# Patient Record
Sex: Female | Born: 1935
Health system: Southern US, Community
[De-identification: ages and names within clinical notes are randomized; demographics above are authoritative.]

## PROBLEM LIST (undated history)

## (undated) DIAGNOSIS — I2510 Atherosclerotic heart disease of native coronary artery without angina pectoris: Secondary | ICD-10-CM

## (undated) DIAGNOSIS — Z8719 Personal history of other diseases of the digestive system: Secondary | ICD-10-CM

## (undated) DIAGNOSIS — I441 Atrioventricular block, second degree: Secondary | ICD-10-CM

## (undated) DIAGNOSIS — D649 Anemia, unspecified: Secondary | ICD-10-CM

## (undated) DIAGNOSIS — K635 Polyp of colon: Secondary | ICD-10-CM

## (undated) DIAGNOSIS — Z9981 Dependence on supplemental oxygen: Secondary | ICD-10-CM

## (undated) DIAGNOSIS — M1990 Unspecified osteoarthritis, unspecified site: Secondary | ICD-10-CM

## (undated) DIAGNOSIS — T7840XA Allergy, unspecified, initial encounter: Secondary | ICD-10-CM

## (undated) DIAGNOSIS — I480 Paroxysmal atrial fibrillation: Secondary | ICD-10-CM

## (undated) DIAGNOSIS — Z950 Presence of cardiac pacemaker: Secondary | ICD-10-CM

## (undated) DIAGNOSIS — S301XXA Contusion of abdominal wall, initial encounter: Secondary | ICD-10-CM

## (undated) DIAGNOSIS — I10 Essential (primary) hypertension: Secondary | ICD-10-CM

## (undated) HISTORY — DX: Allergy, unspecified, initial encounter: T78.40XA

## (undated) HISTORY — DX: Atrioventricular block, second degree: I44.1

## (undated) HISTORY — DX: Unspecified osteoarthritis, unspecified site: M19.90

## (undated) HISTORY — PX: CARDIAC CATHETERIZATION: SHX172

## (undated) HISTORY — PX: VARICOSE VEIN SURGERY: SHX832

## (undated) HISTORY — DX: Polyp of colon: K63.5

## (undated) HISTORY — PX: CATARACT EXTRACTION W/ INTRAOCULAR LENS  IMPLANT, BILATERAL: SHX1307

## (undated) HISTORY — DX: Essential (primary) hypertension: I10

## (undated) HISTORY — PX: BREAST CYST EXCISION: SHX579

## (undated) HISTORY — PX: DILATION AND CURETTAGE OF UTERUS: SHX78

---

## 1965-05-08 HISTORY — PX: BUNIONECTOMY: SHX129

## 1997-12-30 ENCOUNTER — Ambulatory Visit (HOSPITAL_COMMUNITY): Admission: RE | Admit: 1997-12-30 | Discharge: 1997-12-30 | Payer: Self-pay | Admitting: Gastroenterology

## 2004-12-30 ENCOUNTER — Ambulatory Visit (HOSPITAL_COMMUNITY): Admission: RE | Admit: 2004-12-30 | Discharge: 2004-12-30 | Payer: Self-pay | Admitting: Gastroenterology

## 2007-04-11 LAB — HM COLONOSCOPY: HM Colonoscopy: 2

## 2007-05-12 LAB — HM MAMMOGRAPHY: HM Mammogram: NEGATIVE

## 2007-05-12 LAB — HM PAP SMEAR: HM Pap smear: NEGATIVE

## 2008-03-25 ENCOUNTER — Encounter: Admission: RE | Admit: 2008-03-25 | Discharge: 2008-03-25 | Payer: Self-pay | Admitting: Obstetrics and Gynecology

## 2010-05-29 ENCOUNTER — Encounter: Payer: Self-pay | Admitting: Family Medicine

## 2010-09-23 NOTE — Op Note (Signed)
[name], Theresa Norton            ACCOUNT NO.:  [id number]   MEDICAL RECORD NO.:  [id number]          PATIENT TYPE:  AMB   LOCATION:  ENDO                         FACILITY:  [facility]   PHYSICIAN:  Anselmo Rod, M.D.  DATE OF BIRTH:  18-Sep-1928   DATE OF PROCEDURE:  12/30/2004  DATE OF DISCHARGE:                                 OPERATIVE REPORT   PROCEDURE:  Screening colonoscopy.   ENDOSCOPIST:  Anselmo Rod, M.D.   INSTRUMENT USED:  Olympus video colonoscope.   INDICATIONS FOR PROCEDURE:  A 77 year old white female underwent a screening  colonoscopy to rule out colonic polyps, masses, etc.   PREPROCEDURE PREPARATION:  Informed consent was procured from the patient.  The patient was fasted for 8 hours prior to the procedure and prepped with  Osmoprep pills prior to the procedure.   PREPROCEDURE PHYSICAL:  The patient had stable vital signs. Neck supple.  Chest clear to auscultation. S1, S2 regular. Abdomen soft with normal bowel  sounds.   DESCRIPTION OF PROCEDURE:  The patient was placed in the left lateral  decubitus position and sedated with 45 mg of Demerol and 5 mg of Versed in  slow incremental doses. Once the patient was adequately sedated and  maintained on low flow oxygen and continuous cardiac monitoring, the Olympus  video colonoscope was advanced from the rectum to the cecum. The appendiceal  orifice and ileocecal valve were visualized, multiple washings were done. No  masses, polyps, erosions, ulcerations or diverticula were seen. Retroflexion  in the rectum revealed small internal hemorrhoids. The patient tolerated the  procedure well without complications.   IMPRESSION:  Normal colonoscopy to the cecum except for small nonbleeding  internal hemorrhoids. No masses, polyps, erosions, ulcerations or  diverticula seen.   RECOMMENDATIONS:  1.  Continue on high fiber diet with liberal fluid intake.  2.  Repeat colonoscopy in the next 10 years unless the patient  develops any      abnormal symptoms in the interim.  3.  Outpatient followup as needed arises in the future.      Anselmo Rod, M.D.  Electronically Signed     JNM/MEDQ  D:  12/30/2004  T:  12/31/2004  Job:  [id number]   cc:   Alexia Freestone, M.D.  [address]  [place]  [place] [address]  Fax: [phone]

## 2011-05-10 DIAGNOSIS — I10 Essential (primary) hypertension: Secondary | ICD-10-CM | POA: Diagnosis not present

## 2011-06-29 DIAGNOSIS — H2510 Age-related nuclear cataract, unspecified eye: Secondary | ICD-10-CM | POA: Diagnosis not present

## 2011-06-29 DIAGNOSIS — H353290 Exudative age-related macular degeneration, unspecified eye, stage unspecified: Secondary | ICD-10-CM | POA: Diagnosis not present

## 2011-06-29 DIAGNOSIS — H353190 Nonexudative age-related macular degeneration, unspecified eye, stage unspecified: Secondary | ICD-10-CM | POA: Diagnosis not present

## 2011-07-18 DIAGNOSIS — R7989 Other specified abnormal findings of blood chemistry: Secondary | ICD-10-CM | POA: Diagnosis not present

## 2011-07-18 DIAGNOSIS — I10 Essential (primary) hypertension: Secondary | ICD-10-CM | POA: Diagnosis not present

## 2011-07-18 DIAGNOSIS — E559 Vitamin D deficiency, unspecified: Secondary | ICD-10-CM | POA: Diagnosis not present

## 2011-08-02 DIAGNOSIS — I10 Essential (primary) hypertension: Secondary | ICD-10-CM | POA: Diagnosis not present

## 2011-08-16 DIAGNOSIS — H2510 Age-related nuclear cataract, unspecified eye: Secondary | ICD-10-CM | POA: Diagnosis not present

## 2011-08-16 DIAGNOSIS — H3530 Unspecified macular degeneration: Secondary | ICD-10-CM | POA: Diagnosis not present

## 2011-09-07 DIAGNOSIS — H353290 Exudative age-related macular degeneration, unspecified eye, stage unspecified: Secondary | ICD-10-CM | POA: Diagnosis not present

## 2011-09-07 DIAGNOSIS — H353190 Nonexudative age-related macular degeneration, unspecified eye, stage unspecified: Secondary | ICD-10-CM | POA: Diagnosis not present

## 2011-09-07 DIAGNOSIS — H2510 Age-related nuclear cataract, unspecified eye: Secondary | ICD-10-CM | POA: Diagnosis not present

## 2011-10-30 DIAGNOSIS — I10 Essential (primary) hypertension: Secondary | ICD-10-CM | POA: Diagnosis not present

## 2011-11-16 DIAGNOSIS — H353290 Exudative age-related macular degeneration, unspecified eye, stage unspecified: Secondary | ICD-10-CM | POA: Diagnosis not present

## 2011-11-16 DIAGNOSIS — H2510 Age-related nuclear cataract, unspecified eye: Secondary | ICD-10-CM | POA: Diagnosis not present

## 2011-11-16 DIAGNOSIS — H353190 Nonexudative age-related macular degeneration, unspecified eye, stage unspecified: Secondary | ICD-10-CM | POA: Diagnosis not present

## 2012-01-03 DIAGNOSIS — E559 Vitamin D deficiency, unspecified: Secondary | ICD-10-CM | POA: Diagnosis not present

## 2012-01-03 DIAGNOSIS — I10 Essential (primary) hypertension: Secondary | ICD-10-CM | POA: Diagnosis not present

## 2012-01-03 DIAGNOSIS — R7989 Other specified abnormal findings of blood chemistry: Secondary | ICD-10-CM | POA: Diagnosis not present

## 2012-01-03 DIAGNOSIS — E039 Hypothyroidism, unspecified: Secondary | ICD-10-CM | POA: Diagnosis not present

## 2012-01-15 DIAGNOSIS — I10 Essential (primary) hypertension: Secondary | ICD-10-CM | POA: Diagnosis not present

## 2012-01-18 DIAGNOSIS — H2510 Age-related nuclear cataract, unspecified eye: Secondary | ICD-10-CM | POA: Diagnosis not present

## 2012-01-18 DIAGNOSIS — H353290 Exudative age-related macular degeneration, unspecified eye, stage unspecified: Secondary | ICD-10-CM | POA: Diagnosis not present

## 2012-01-18 DIAGNOSIS — H353190 Nonexudative age-related macular degeneration, unspecified eye, stage unspecified: Secondary | ICD-10-CM | POA: Diagnosis not present

## 2012-02-16 DIAGNOSIS — H2510 Age-related nuclear cataract, unspecified eye: Secondary | ICD-10-CM | POA: Diagnosis not present

## 2012-02-16 DIAGNOSIS — H353190 Nonexudative age-related macular degeneration, unspecified eye, stage unspecified: Secondary | ICD-10-CM | POA: Diagnosis not present

## 2012-03-28 DIAGNOSIS — H353190 Nonexudative age-related macular degeneration, unspecified eye, stage unspecified: Secondary | ICD-10-CM | POA: Diagnosis not present

## 2012-03-28 DIAGNOSIS — H353290 Exudative age-related macular degeneration, unspecified eye, stage unspecified: Secondary | ICD-10-CM | POA: Diagnosis not present

## 2012-04-10 ENCOUNTER — Ambulatory Visit (INDEPENDENT_AMBULATORY_CARE_PROVIDER_SITE_OTHER): Payer: Medicare Other | Admitting: Family Medicine

## 2012-04-10 ENCOUNTER — Encounter: Payer: Self-pay | Admitting: Family Medicine

## 2012-04-10 VITALS — BP 205/88 | HR 72 | Temp 97.7°F | Resp 12 | Ht 61.5 in | Wt 167.0 lb

## 2012-04-10 DIAGNOSIS — R030 Elevated blood-pressure reading, without diagnosis of hypertension: Secondary | ICD-10-CM | POA: Diagnosis not present

## 2012-04-10 DIAGNOSIS — K5900 Constipation, unspecified: Secondary | ICD-10-CM | POA: Diagnosis not present

## 2012-04-10 DIAGNOSIS — Z86010 Personal history of colonic polyps: Secondary | ICD-10-CM | POA: Diagnosis not present

## 2012-04-10 DIAGNOSIS — IMO0001 Reserved for inherently not codable concepts without codable children: Secondary | ICD-10-CM

## 2012-04-10 MED ORDER — AMLODIPINE BESYLATE 5 MG PO TABS: 5.0000 mg | ORAL_TABLET | Freq: Every day | ORAL | Status: DC

## 2012-04-10 NOTE — Patient Instructions (Signed)
Hypertension  As your heart beats, it forces blood through your arteries. This force is your blood pressure. If the pressure is too high, it is called hypertension (HTN) or high blood pressure. HTN is dangerous because you may have it and not know it. High blood pressure may mean that your heart has to work harder to pump blood. Your arteries may be narrow or stiff. The extra work puts you at risk for heart disease, stroke, and other problems.   Blood pressure consists of two numbers, a higher number over a lower, 110/72, for example. It is stated as "110 over 72." The ideal is below 120 for the top number (systolic) and under 80 for the bottom (diastolic). Write down your blood pressure today.  You should pay close attention to your blood pressure if you have certain conditions such as:   Heart failure.   Prior heart attack.   Diabetes   Chronic kidney disease.   Prior stroke.   Multiple risk factors for heart disease.  To see if you have HTN, your blood pressure should be measured while you are seated with your arm held at the level of the heart. It should be measured at least twice. A one-time elevated blood pressure reading (especially in the Emergency Department) does not mean that you need treatment. There may be conditions in which the blood pressure is different between your right and left arms. It is important to see your caregiver soon for a recheck.  Most people have essential hypertension which means that there is not a specific cause. This type of high blood pressure may be lowered by changing lifestyle factors such as:   Stress.   Smoking.   Lack of exercise.   Excessive weight.   Drug/tobacco/alcohol use.   Eating less salt.  Most people do not have symptoms from high blood pressure until it has caused damage to the body. Effective treatment can often prevent, delay or reduce that damage.  TREATMENT   When a cause has been identified, treatment for high blood pressure is directed at the  cause. There are a large number of medications to treat HTN. These fall into several categories, and your caregiver will help you select the medicines that are best for you. Medications may have side effects. You should review side effects with your caregiver.  If your blood pressure stays high after you have made lifestyle changes or started on medicines,    Your medication(s) may need to be changed.   Other problems may need to be addressed.   Be certain you understand your prescriptions, and know how and when to take your medicine.   Be sure to follow up with your caregiver within the time frame advised (usually within two weeks) to have your blood pressure rechecked and to review your medications.   If you are taking more than one medicine to lower your blood pressure, make sure you know how and at what times they should be taken. Taking two medicines at the same time can result in blood pressure that is too low.  SEEK IMMEDIATE MEDICAL CARE IF:   You develop a severe headache, blurred or changing vision, or confusion.   You have unusual weakness or numbness, or a faint feeling.   You have severe chest or abdominal pain, vomiting, or breathing problems.  MAKE SURE YOU:    Understand these instructions.   Will watch your condition.   Will get help right away if you are not doing well   or get worse.  Document Released: 04/24/2005 Document Revised: 07/17/2011 Document Reviewed: 12/13/2007  [employer] Patient Information 2013 [employer].    Constipation, Adult  Constipation is when a person has fewer than 3 bowel movements a week; has difficulty having a bowel movement; or has stools that are dry, hard, or larger than normal. As people grow older, constipation is more common. If you try to fix constipation with medicines that make you have a bowel movement (laxatives), the problem may get worse. Long-term laxative use may cause the muscles of the colon to become weak. A low-fiber diet, not taking in  enough fluids, and taking certain medicines may make constipation worse.  CAUSES    Certain medicines, such as antidepressants, pain medicine, iron supplements, antacids, and water pills.    Certain diseases, such as diabetes, irritable bowel syndrome (IBS), thyroid disease, or depression.    Not drinking enough water.    Not eating enough fiber-rich foods.    Stress or travel.   Lack of physical activity or exercise.   Not going to the restroom when there is the urge to have a bowel movement.   Ignoring the urge to have a bowel movement.   Using laxatives too much.  SYMPTOMS    Having fewer than 3 bowel movements a week.    Straining to have a bowel movement.    Having hard, dry, or larger than normal stools.    Feeling full or bloated.    Pain in the lower abdomen.   Not feeling relief after having a bowel movement.  DIAGNOSIS   Your caregiver will take a medical history and perform a physical exam. Further testing may be done for severe constipation. Some tests may include:    A barium enema X-ray to examine your rectum, colon, and sometimes, your small intestine.   A sigmoidoscopy to examine your lower colon.   A colonoscopy to examine your entire colon.  TREATMENT   Treatment will depend on the severity of your constipation and what is causing it. Some dietary treatments include drinking more fluids and eating more fiber-rich foods. Lifestyle treatments may include regular exercise. If these diet and lifestyle recommendations do not help, your caregiver may recommend taking over-the-counter laxative medicines to help you have bowel movements. Prescription medicines may be prescribed if over-the-counter medicines do not work.   HOME CARE INSTRUCTIONS    Increase dietary fiber in your diet, such as fruits, vegetables, whole grains, and beans. Limit high-fat and processed sugars in your diet, such as French fries, hamburgers, cookies, candies, and soda.    A fiber supplement may be  added to your diet if you cannot get enough fiber from foods.    Drink enough fluids to keep your urine clear or pale yellow.    Exercise regularly or as directed by your caregiver.    Go to the restroom when you have the urge to go. Do not hold it.   Only take medicines as directed by your caregiver. Do not take other medicines for constipation without talking to your caregiver first.  SEEK IMMEDIATE MEDICAL CARE IF:    You have bright red blood in your stool.    Your constipation lasts for more than 4 days or gets worse.    You have abdominal or rectal pain.    You have thin, pencil-like stools.   You have unexplained weight loss.  MAKE SURE YOU:    Understand these instructions.   Will watch your condition.  

## 2012-04-10 NOTE — Progress Notes (Signed)
  Subjective:    Patient ID: Theresa Norton, female    DOB: 08-18-1928, 78 y.o.   MRN: [id number]  HPI  Patient new to establish care. She's previously been seen in an alternative medicine type clinic. Not clear she's had any recent lab work. Past medical history reviewed. Takes no medications. She's had some generalized osteoarthritis but coping fairly well. She relates several months and possibly several year history of elevated blood pressure but never treated with medication. She states her systolic blood pressures have been as high as 200 in recent months but she's never placed on medication. Denies headache. No chest pains. No dizziness. No peripheral edema.  Patient reports past history of benign colon polyps. She thinks she had colonoscopy in 2008 but not sure. She has frequent problems with constipation. No anti-cholinergic medicine use. Otherwise no change in stools.  Patient is widowed since May of this year. She is coping fairly well. Has good support systems. No history of smoking. Rare alcohol use.   Review of Systems  Constitutional: Positive for fatigue (occasional mild fatigue issues).  Eyes: Negative for visual disturbance.  Respiratory: Negative for cough, chest tightness, shortness of breath and wheezing.   Cardiovascular: Negative for chest pain, palpitations and leg swelling.  Neurological: Negative for dizziness, seizures, syncope, weakness, light-headedness and headaches.       Objective:   Physical Exam  Constitutional: She appears well-developed and well-nourished.  Neck: Neck supple. No thyromegaly present.  Cardiovascular: Normal rate and regular rhythm.   Pulmonary/Chest: Effort normal and breath sounds normal. No respiratory distress. She has no wheezes. She has no rales.  Abdominal: Soft. Bowel sounds are normal. She exhibits no distension and no mass. There is no tenderness. There is no rebound and no guarding.       No renal bruits  Musculoskeletal: She  exhibits no edema.          Assessment & Plan:  #1 hypertension, severe. She gives history of several previous elevation but never treated. Given the severity of elevation, start amlodipine 5 mg daily. Educational handout given. Sodium reduction diet. Reassess one week. Consider addition of HCTZ if still elevated at that time.  We'll plan labs at followup including lipid panel, basic metabolic panel, and TSH #2 history of constipation. Discussed conservative measures to reduce. Confirm date of last colonoscopy #3 history of reported benign colon polyps

## 2012-04-16 DIAGNOSIS — I10 Essential (primary) hypertension: Secondary | ICD-10-CM | POA: Diagnosis not present

## 2012-04-17 ENCOUNTER — Ambulatory Visit (INDEPENDENT_AMBULATORY_CARE_PROVIDER_SITE_OTHER): Payer: Medicare Other | Admitting: Family Medicine

## 2012-04-17 ENCOUNTER — Encounter: Payer: Self-pay | Admitting: Family Medicine

## 2012-04-17 ENCOUNTER — Encounter: Payer: Self-pay | Admitting: *Deleted

## 2012-04-17 VITALS — BP 205/88 | HR 45 | Temp 97.8°F | Resp 12 | Wt 166.0 lb

## 2012-04-17 DIAGNOSIS — R001 Bradycardia, unspecified: Secondary | ICD-10-CM

## 2012-04-17 DIAGNOSIS — R42 Dizziness and giddiness: Secondary | ICD-10-CM

## 2012-04-17 DIAGNOSIS — I498 Other specified cardiac arrhythmias: Secondary | ICD-10-CM

## 2012-04-17 DIAGNOSIS — L259 Unspecified contact dermatitis, unspecified cause: Secondary | ICD-10-CM | POA: Diagnosis not present

## 2012-04-17 DIAGNOSIS — I10 Essential (primary) hypertension: Secondary | ICD-10-CM

## 2012-04-17 LAB — LIPID PANEL
Cholesterol: 232 mg/dL — ABNORMAL HIGH (ref 0–200)
HDL: 96.8 mg/dL (ref >39.00)
Total CHOL/HDL Ratio: 2
Triglycerides: 136 mg/dL (ref 0.0–149.0)
VLDL: 27.2 mg/dL (ref 0.0–40.0)

## 2012-04-17 LAB — BASIC METABOLIC PANEL
BUN: 15 mg/dL (ref 6–23)
CO2: 28 mEq/L (ref 19–32)
Calcium: 8.6 mg/dL (ref 8.4–10.5)
Chloride: 100 mEq/L (ref 96–112)
Creatinine, Ser: 0.7 mg/dL (ref 0.4–1.2)
GFR: 79.93 mL/min (ref >60.00)
Glucose, Bld: 95 mg/dL (ref 70–99)
Potassium: 4.3 mEq/L (ref 3.5–5.1)
Sodium: 136 mEq/L (ref 135–145)

## 2012-04-17 LAB — LDL CHOLESTEROL, DIRECT: Direct LDL: 121.9 mg/dL

## 2012-04-17 LAB — TSH: TSH: 0.49 u[IU]/mL (ref 0.35–5.50)

## 2012-04-17 MED ORDER — TRIAMCINOLONE ACETONIDE 0.1 % EX CREA: TOPICAL_CREAM | Freq: Two times a day (BID) | CUTANEOUS | Status: DC

## 2012-04-17 MED ORDER — LISINOPRIL-HYDROCHLOROTHIAZIDE 10-12.5 MG PO TABS: 1.0000 | ORAL_TABLET | Freq: Every day | ORAL | Status: DC

## 2012-04-17 NOTE — Progress Notes (Signed)
  Subjective:    Patient ID: Theresa Norton, female    DOB: 09/02/1928, 78 y.o.   MRN: [id number]  HPI  Patient seen in followup severe hypertension. Patient had been going to alternative medicine clinic. Apparently has had elevated blood pressure for quite some time. We started amlodipine 5 mg daily. She did not take dosage this morning. Blood pressure by home machine 170/80. No headaches. She describes some mild dizziness couple times earlier this week. No chest pain. No orthostasis. During episodes patient states her heart rate was down around 35 or 40. Only takes amlodipine 5 mg daily. No beta blocker use. No history of heart difficulties. Denies current dizziness at rest.  Patient complains of some itching and irritation right antecubital fossa. She apparently has some type of IV injection of B. and C. vitamins recently at alternative health clinic above. Denies any fever.  Past Medical History  Diagnosis Date  . Arthritis   . Allergy   . Hypertension   . Colon polyps    Past Surgical History  Procedure Date  . Breast surgery 1959    cyst removal  . Bunionectomy 1967    both feet    reports that she has never smoked. She does not have any smokeless tobacco history on file. Her alcohol and drug histories not on file. family history includes Cancer in her father. Allergies  Allergen Reactions  . Codeine     GI upset  . Morphine And Related     GI upset      Review of Systems  Constitutional: Negative for fever and chills.  Respiratory: Negative for cough, shortness of breath and wheezing.   Cardiovascular: Negative for chest pain, palpitations and leg swelling.  Gastrointestinal: Negative for abdominal pain.  Skin: Positive for rash.  Neurological: Positive for dizziness. Negative for syncope, weakness and headaches.  Hematological: Negative for adenopathy. Does not bruise/bleed easily.       Objective:   Physical Exam  Constitutional: She is oriented to person,  place, and time. She appears well-developed and well-nourished. No distress.  Neck: Neck supple. No thyromegaly present.  Cardiovascular:       Regular rhythm bradycardic with rate around 45-48 by my auscultation  Pulmonary/Chest: Effort normal and breath sounds normal. No respiratory distress. She has no wheezes. She has no rales.  Musculoskeletal: She exhibits no edema.  Lymphadenopathy:    She has no cervical adenopathy.  Neurological: She is alert and oriented to person, place, and time.          Assessment & Plan:  #1 severe hypertension. Even by home readings poorly controlled. Add HCTZ 12.5 mg once daily. Avoid beta blocker use with bradycardia #2 bradycardia by exam today. Check EKG. Rule out heart block. She does not take any beta blockers. Not symptomatic currently. Consider Holter monitor #3 skin rash right elbow. This appears compatible with contact type dermatitis. No evidence for cellulitis. Triamcinolone 0.1% cream  EKG junctional bradycardia with high grade AV block.  HR 44.  Cardiology consult.  Spoke with cardiology-Dr Ladona Ridgel. Recommendation to discontinue amlodipine although low risk associated heart block-. Start lisinopril HCTZ 10/12.5 one daily. Patient has no symptoms at this time. She knows to present immediately to emergency department if she developed any dizziness or other symptoms. We'll reassess her Monday. If still bradycardic at that point we'll push to get her in to cardiology sooner. They have tentatively scheduled to see her a week from this Friday.

## 2012-04-17 NOTE — Patient Instructions (Addendum)
Stop amlodipine Start lisinopril HCTZ one daily starting tomorrow Schedule followup here in our office next Monday Followup sooner or go to emergency room if you develop any increased dizziness, shortness of breath, or any other concerning symptoms

## 2012-04-19 NOTE — Progress Notes (Signed)
Quick Note:  Pt informed on personally identified VM ______ 

## 2012-04-22 ENCOUNTER — Ambulatory Visit: Payer: Medicare Other | Admitting: Family Medicine

## 2012-04-23 ENCOUNTER — Encounter: Payer: Self-pay | Admitting: Family Medicine

## 2012-04-23 ENCOUNTER — Ambulatory Visit (INDEPENDENT_AMBULATORY_CARE_PROVIDER_SITE_OTHER): Payer: Medicare Other | Admitting: Family Medicine

## 2012-04-23 VITALS — BP 160/84 | HR 80 | Temp 98.5°F | Resp 12 | Wt 168.0 lb

## 2012-04-23 DIAGNOSIS — I441 Atrioventricular block, second degree: Secondary | ICD-10-CM

## 2012-04-23 DIAGNOSIS — I10 Essential (primary) hypertension: Secondary | ICD-10-CM

## 2012-04-23 NOTE — Patient Instructions (Addendum)
Continue daily use of blood pressure medication. Followup immediately for any increased dizziness or if your heart rate drops below 50 again

## 2012-04-23 NOTE — Progress Notes (Signed)
  Subjective:    Patient ID: Theresa Norton, female    DOB: 1928/07/23, 78 y.o.   MRN: [id number]  HPI  Patient seen for followup regarding severe hypertension and bradycardia with high-grade A-V block. Patient was not symptomatic at presentation last week. She had recently been started on amlodipine. We discontinued amlodipine and started lisinopril HCTZ. She has no dizziness at this time. She has noted that her pulse has increased back to normal and her blood pressure has also improved. She was over 200 systolic previously now 150-160 systolic. No headaches. No chest pains. No dyspnea. No history of beta blocker use  Past Medical History  Diagnosis Date  . Arthritis   . Allergy   . Hypertension   . Colon polyps    Past Surgical History  Procedure Date  . Breast surgery 1959    cyst removal  . Bunionectomy 1967    both feet    reports that she has never smoked. She does not have any smokeless tobacco history on file. Her alcohol and drug histories not on file. family history includes Cancer in her father. Allergies  Allergen Reactions  . Codeine     GI upset  . Morphine And Related     GI upset      Review of Systems  Constitutional: Negative for fatigue.  Eyes: Negative for visual disturbance.  Respiratory: Negative for cough, chest tightness, shortness of breath and wheezing.   Cardiovascular: Negative for chest pain, palpitations and leg swelling.  Neurological: Negative for dizziness, seizures, syncope, weakness, light-headedness and headaches.       Objective:   Physical Exam  Constitutional: She appears well-developed and well-nourished.  Cardiovascular: Normal rate and regular rhythm.  Exam reveals no gallop.   Pulmonary/Chest: Effort normal and breath sounds normal. No respiratory distress. She has no wheezes. She has no rales.  Musculoskeletal: She exhibits no edema.          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 Clinically, patient appears to have resolved and has normal pulse with rate around 76 today. Repeat EKG and she appears to be back in sinus rhythm. No further calcium channel blocker or beta blocker. #2 hypertension. Improving control with lisinopril HCTZ. Reassess in one month.  Titrate then if indicated.  EKG shows sinus rhythm with right bundle branch block type pattern. Her heart rate is back up in the normal range at 75. Will avoid calcium channel blockers.

## 2012-05-20 ENCOUNTER — Ambulatory Visit (INDEPENDENT_AMBULATORY_CARE_PROVIDER_SITE_OTHER): Payer: Medicare Other | Admitting: Family Medicine

## 2012-05-20 ENCOUNTER — Encounter: Payer: Self-pay | Admitting: Family Medicine

## 2012-05-20 VITALS — BP 132/80 | Temp 98.4°F | Wt 166.0 lb

## 2012-05-20 DIAGNOSIS — I10 Essential (primary) hypertension: Secondary | ICD-10-CM | POA: Diagnosis not present

## 2012-05-20 LAB — BASIC METABOLIC PANEL
BUN: 12 mg/dL (ref 6–23)
CO2: 24 mEq/L (ref 19–32)
Calcium: 8.8 mg/dL (ref 8.4–10.5)
Chloride: 93 mEq/L — ABNORMAL LOW (ref 96–112)
Creatinine, Ser: 0.8 mg/dL (ref 0.4–1.2)
GFR: 78.69 mL/min (ref >60.00)
Glucose, Bld: 86 mg/dL (ref 70–99)
Potassium: 3.9 mEq/L (ref 3.5–5.1)
Sodium: 128 mEq/L — ABNORMAL LOW (ref 135–145)

## 2012-05-20 NOTE — Progress Notes (Signed)
  Subjective:    Patient ID: Theresa Norton, female    DOB: 1928/09/16, 79 y.o.   MRN: [id number]  HPI Patient is a for followup hypertension. Refer to prior note. We initially placed her on amlodipine for severely elevated blood pressure. She returned with bradycardia and Mobitz type II block. I consulted with cardiology and they suggested stopping any calcium channel blocker and we started lisinopril HCTZ. Off followup visit next week she was back to normal rhythm and normal heart rate of 75. She's done extremely well lisinopril HCTZ. Systolic blood pressures been run 130. No dizziness. No cough. Overall feels improved   Review of Systems  Constitutional: Negative for fatigue.  Eyes: Negative for visual disturbance.  Respiratory: Negative for cough, chest tightness, shortness of breath and wheezing.   Cardiovascular: Negative for chest pain, palpitations and leg swelling.  Neurological: Negative for dizziness, seizures, syncope, weakness, light-headedness and headaches.       Objective:   Physical Exam  Constitutional: She appears well-developed and well-nourished.  Neck: Neck supple. No thyromegaly present.  Cardiovascular: Normal rate and regular rhythm.   Pulmonary/Chest: Effort normal and breath sounds normal. No respiratory distress. She has no wheezes. She has no rales.  Musculoskeletal: She exhibits no edema.          Assessment & Plan:  Hypertension. Improved and at goal. Check basic metabolic panel with recent initiation of ACE inhibitor. Recent high-grade A-V block with bradycardia resolved off calcium channel blocker. Avoid future use of beta blockers and calcium channel blockers if possible

## 2012-05-21 ENCOUNTER — Other Ambulatory Visit: Payer: Self-pay | Admitting: *Deleted

## 2012-05-21 DIAGNOSIS — I10 Essential (primary) hypertension: Secondary | ICD-10-CM

## 2012-05-21 MED ORDER — LISINOPRIL 20 MG PO TABS: 20.0000 mg | ORAL_TABLET | Freq: Every day | ORAL | Status: DC

## 2012-05-21 NOTE — Progress Notes (Signed)
Quick Note:  Pt informed on home VM ______ 

## 2012-05-21 NOTE — Progress Notes (Signed)
Quick Note:  Lisinopril 20 mg sent ______

## 2012-06-03 DIAGNOSIS — R300 Dysuria: Secondary | ICD-10-CM | POA: Diagnosis not present

## 2012-06-03 DIAGNOSIS — D509 Iron deficiency anemia, unspecified: Secondary | ICD-10-CM | POA: Diagnosis not present

## 2012-06-03 DIAGNOSIS — I10 Essential (primary) hypertension: Secondary | ICD-10-CM | POA: Diagnosis not present

## 2012-06-03 DIAGNOSIS — R7989 Other specified abnormal findings of blood chemistry: Secondary | ICD-10-CM | POA: Diagnosis not present

## 2012-06-03 DIAGNOSIS — E559 Vitamin D deficiency, unspecified: Secondary | ICD-10-CM | POA: Diagnosis not present

## 2012-06-18 DIAGNOSIS — I10 Essential (primary) hypertension: Secondary | ICD-10-CM | POA: Diagnosis not present

## 2012-07-04 DIAGNOSIS — H353190 Nonexudative age-related macular degeneration, unspecified eye, stage unspecified: Secondary | ICD-10-CM | POA: Diagnosis not present

## 2012-07-04 DIAGNOSIS — H2510 Age-related nuclear cataract, unspecified eye: Secondary | ICD-10-CM | POA: Diagnosis not present

## 2012-07-04 DIAGNOSIS — H353290 Exudative age-related macular degeneration, unspecified eye, stage unspecified: Secondary | ICD-10-CM | POA: Diagnosis not present

## 2012-08-06 DIAGNOSIS — I10 Essential (primary) hypertension: Secondary | ICD-10-CM | POA: Diagnosis not present

## 2012-09-05 DIAGNOSIS — H2510 Age-related nuclear cataract, unspecified eye: Secondary | ICD-10-CM | POA: Diagnosis not present

## 2012-09-05 DIAGNOSIS — H353290 Exudative age-related macular degeneration, unspecified eye, stage unspecified: Secondary | ICD-10-CM | POA: Diagnosis not present

## 2012-09-05 DIAGNOSIS — H353190 Nonexudative age-related macular degeneration, unspecified eye, stage unspecified: Secondary | ICD-10-CM | POA: Diagnosis not present

## 2012-09-18 DIAGNOSIS — I10 Essential (primary) hypertension: Secondary | ICD-10-CM | POA: Diagnosis not present

## 2012-09-25 DIAGNOSIS — H2510 Age-related nuclear cataract, unspecified eye: Secondary | ICD-10-CM | POA: Diagnosis not present

## 2012-09-30 DIAGNOSIS — J019 Acute sinusitis, unspecified: Secondary | ICD-10-CM | POA: Diagnosis not present

## 2012-09-30 DIAGNOSIS — R059 Cough, unspecified: Secondary | ICD-10-CM | POA: Diagnosis not present

## 2012-09-30 DIAGNOSIS — R05 Cough: Secondary | ICD-10-CM | POA: Diagnosis not present

## 2012-10-03 ENCOUNTER — Telehealth: Payer: Self-pay | Admitting: Family Medicine

## 2012-10-03 ENCOUNTER — Emergency Department (HOSPITAL_COMMUNITY)
Admission: EM | Admit: 2012-10-03 | Discharge: 2012-10-03 | Disposition: A | Discharge status: Home / Self Care | Payer: Medicare Other | Attending: Emergency Medicine | Admitting: Emergency Medicine

## 2012-10-03 ENCOUNTER — Emergency Department (HOSPITAL_COMMUNITY): Payer: Medicare Other

## 2012-10-03 ENCOUNTER — Encounter (HOSPITAL_COMMUNITY): Payer: Self-pay | Admitting: Emergency Medicine

## 2012-10-03 DIAGNOSIS — R071 Chest pain on breathing: Secondary | ICD-10-CM | POA: Diagnosis not present

## 2012-10-03 DIAGNOSIS — Z79899 Other long term (current) drug therapy: Secondary | ICD-10-CM | POA: Insufficient documentation

## 2012-10-03 DIAGNOSIS — R05 Cough: Secondary | ICD-10-CM

## 2012-10-03 DIAGNOSIS — S298XXA Other specified injuries of thorax, initial encounter: Secondary | ICD-10-CM | POA: Diagnosis not present

## 2012-10-03 DIAGNOSIS — R109 Unspecified abdominal pain: Secondary | ICD-10-CM | POA: Diagnosis not present

## 2012-10-03 DIAGNOSIS — Z860100 Personal history of colon polyps, unspecified: Secondary | ICD-10-CM | POA: Insufficient documentation

## 2012-10-03 DIAGNOSIS — Z8739 Personal history of other diseases of the musculoskeletal system and connective tissue: Secondary | ICD-10-CM | POA: Insufficient documentation

## 2012-10-03 DIAGNOSIS — I10 Essential (primary) hypertension: Secondary | ICD-10-CM | POA: Diagnosis not present

## 2012-10-03 DIAGNOSIS — R059 Cough, unspecified: Secondary | ICD-10-CM | POA: Diagnosis not present

## 2012-10-03 DIAGNOSIS — Z8679 Personal history of other diseases of the circulatory system: Secondary | ICD-10-CM | POA: Diagnosis not present

## 2012-10-03 DIAGNOSIS — R079 Chest pain, unspecified: Secondary | ICD-10-CM | POA: Diagnosis not present

## 2012-10-03 DIAGNOSIS — R0789 Other chest pain: Secondary | ICD-10-CM

## 2012-10-03 MED ORDER — LISINOPRIL 20 MG PO TABS
20.0000 mg | ORAL_TABLET | Freq: Once | ORAL | Status: AC
  Administered 2012-10-03: 20 mg via ORAL
  Filled 2012-10-03: qty 1

## 2012-10-03 MED ORDER — ONDANSETRON 8 MG PO TBDP
8.0000 mg | ORAL_TABLET | Freq: Once | ORAL | Status: AC
  Administered 2012-10-03: 8 mg via ORAL
  Filled 2012-10-03: qty 1

## 2012-10-03 MED ORDER — HYDROCODONE-ACETAMINOPHEN 5-325 MG PO TABS
1.0000 | ORAL_TABLET | Freq: Once | ORAL | Status: AC
  Administered 2012-10-03: 1 via ORAL
  Filled 2012-10-03: qty 1

## 2012-10-03 MED ORDER — ONDANSETRON HCL 8 MG PO TABS: 8.0000 mg | ORAL_TABLET | Freq: Three times a day (TID) | ORAL | Status: DC | PRN

## 2012-10-03 MED ORDER — BENZONATATE 100 MG PO CAPS: 100.0000 mg | ORAL_CAPSULE | Freq: Three times a day (TID) | ORAL | Status: DC | PRN

## 2012-10-03 MED ORDER — HYDROCODONE-ACETAMINOPHEN 5-325 MG PO TABS: 1.0000 | ORAL_TABLET | Freq: Four times a day (QID) | ORAL | Status: DC | PRN

## 2012-10-03 NOTE — ED Notes (Signed)
PER EMS- pt picked up from home with c/o cough x2 weeks.  Pt also c/o recent abd pain that occurs while pt coughs.  PT alert and oriented. Hx of recent hernia.

## 2012-10-03 NOTE — Telephone Encounter (Signed)
Patient Information:  Caller Name: Parisha  Phone: [phone]  Patient: Theresa Norton  Gender: Female  DOB: Sep 24, 1928  Age: 79 Years  PCP: Evelena Peat (Family Practice)  Office Follow Up:  Does the office need to follow up with this patient?: No  Instructions For The Office: N/A  RN Note:  Seen at [facility] at [facility] 09/28/12; diagnosed with bronchitis. Treated with Prednisone and Azithromycin. Severe coughing spells. Felt something "break" in her left chest followed by chest pain.  Chest pain is mild "discomfort" when not coughing and increases when coughs.  Reluctant to call 911;  reviewed reasons for emergent evaluation due to her age and symptoms.  Symptoms  Reason For Call & Symptoms: Emergent Call:  Reports coughed hard and "something broke" with sudden pain in left side under ribs.  Mild left chest discomfort when not coughing and increased pain when coughs. Thinks might have a hernia or something.  Reoports severe coughing fits.  Cough present for two weeks.  Reviewed Health History In EMR: Yes  Reviewed Medications In EMR: Yes  Reviewed Allergies In EMR: Yes  Reviewed Surgeries / Procedures: Yes  Date of Onset of Symptoms: 10/03/2012  Treatments Tried: Seen at Urgent Care 09/28/12; Treated with Prednisone and Azithryomycin  Treatments Tried Worked: No  Guideline(s) Used:  Chest Pain  Disposition Per Guideline:   Call EMS 911 Now  Reason For Disposition Reached:   Chest pain lasting longer than 5 minutes and ANY of the following:  Over 21 years old Over 33 years old and at least one cardiac risk factor (i.e., high blood pressure, diabetes, high cholesterol, obesity, smoker or strong family history of heart disease) Pain is crushing, pressure-like, or heavy  Took nitroglycerin and chest pain was not relieved History of heart disease (i.e., angina, heart attack, bypass surgery, angioplasty, CHF)  Advice Given:  N/A  Patient Will Follow Care Advice:   YES

## 2012-10-03 NOTE — Telephone Encounter (Signed)
Caller Name: Johnny Bridge  Phone: [phone]  Patient: Twana First  Gender: Female  DOB: 09/06/1929  Age: 79 Years  PCP: Evelena Peat [facility])   Does the office need to follow up with this patient?: No    Reason For Call & Symptoms: emergent call, startes mom was advised to call 911, to be taken to ED by ambulance, now home with patient, wants to know if way to call ahead for patient to be seen if she takes her to the ED instead, advised against, and to follow original recommendations.   Date of Onset of Symptoms: 10/03/2012  Guideline(s) Used:  No Protocol Available - Sick Adult  Disposition Per Guideline:   Call EMS 911 Now  Reason For Disposition Reached:  Sounds like a life-threatening emergency to the triager  Advice Given: call 911 as previously advised.   Patient Will Follow Care Advice:  YES

## 2012-10-03 NOTE — Telephone Encounter (Signed)
FYI

## 2012-10-03 NOTE — ED Notes (Signed)
[id number]> Expected date:<BR> Expected time:<BR> Means of arrival:<BR> Comments:<BR> 79yo-cough with pain

## 2012-10-03 NOTE — ED Notes (Signed)
MD at bedside. 

## 2012-10-03 NOTE — ED Provider Notes (Addendum)
History     CSN: [id number]  Arrival date & time 10/03/12  1629   First MD Initiated Contact with Patient 10/03/12 1638      Chief Complaint  Patient presents with  . Cough  . Abdominal Pain    (Consider location/radiation/quality/duration/timing/severity/associated sxs/prior treatment) Patient is a 79 y.o. female presenting with cough and abdominal pain. The history is provided by the patient and a relative.  Cough Associated symptoms: no chills, no fever, no headaches, no rash and no shortness of breath   Abdominal Pain Pertinent negatives include no abdominal pain, no headaches and no shortness of breath.  pt c/o non productive cough x 2 weeks. Episodic. Persistent. No abrupt worsening today. Pt went to urgent care 4 days ago and was given abx and steroid rx. Pt states since then cough better but not resolved. Also states w coughing spell has left lower/lateral rib pain, dull, sharp. No sob. No abd pain. No leg pain or swelling. No fever/chills. No sore throat or body aches. No known ill contacts.     Past Medical History  Diagnosis Date  . Arthritis   . Allergy   . Hypertension   . Colon polyps   . Heart block AV second degree 12/13    after initiating Amlodipine    Past Surgical History  Procedure Laterality Date  . Breast surgery  1959    cyst removal  . Bunionectomy  1967    both feet    Family History  Problem Relation Age of Onset  . Cancer Father     lung    History  Substance Use Topics  . Smoking status: Never Smoker   . Smokeless tobacco: Not on file  . Alcohol Use: Not on file    OB History   Grav Para Term Preterm Abortions TAB SAB Ect Mult Living                  Review of Systems  Constitutional: Negative for fever and chills.  HENT: Negative for neck pain and neck stiffness.   Eyes: Negative for redness.  Respiratory: Positive for cough. Negative for shortness of breath.   Cardiovascular: Negative for leg swelling.   Gastrointestinal: Negative for vomiting and abdominal pain.  Genitourinary: Negative for flank pain.  Musculoskeletal: Negative for back pain.  Skin: Negative for rash.  Neurological: Negative for headaches.  Hematological: Does not bruise/bleed easily.  Psychiatric/Behavioral: Negative for confusion.    Allergies  Codeine and Morphine and related  Home Medications   Current Outpatient Rx  Name  Route  Sig  Dispense  Refill  . lisinopril (PRINIVIL,ZESTRIL) 20 MG tablet   Oral   Take 1 tablet (20 mg total) by mouth daily.   30 tablet   3   . triamcinolone cream (KENALOG) 0.1 %   Topical   Apply topically 2 (two) times daily.   30 g   1     SpO2 99%  Physical Exam  Nursing note and vitals reviewed. Constitutional: She appears well-developed and well-nourished. No distress.  HENT:  Mouth/Throat: Oropharynx is clear and moist.  Eyes: Conjunctivae are normal. No scleral icterus.  Neck: Neck supple. No tracheal deviation present.  Cardiovascular: Normal rate, regular rhythm, normal heart sounds and intact distal pulses.   Pulmonary/Chest: Effort normal and breath sounds normal. No respiratory distress. She exhibits tenderness.  Abdominal: Soft. Normal appearance and bowel sounds are normal. She exhibits no distension and no mass. There is no tenderness. There is no  rebound and no guarding.  Musculoskeletal: She exhibits no edema and no tenderness.  Neurological: She is alert.  Steady gait.   Skin: Skin is warm and dry. No rash noted.  Psychiatric: She has a normal mood and affect.    ED Course  Procedures (including critical care time)   Dg Ribs Unilateral W/chest Left  10/03/2012   *RADIOLOGY REPORT*  Clinical Data: Fall.  Cough.  Left anterior rib pain.  LEFT RIBS AND CHEST - 3+ VIEW  Comparison: None.  Findings: Mild cardiomegaly noted with tortuous thoracic aorta. The lungs appear clear.  No pneumothorax or pleural effusion.  Mild levoconvex scoliosis of the  thoracolumbar junction.  No definite rib fracture observed.  IMPRESSION:  1.  No definite rib fracture. Please note that nondisplaced rib fractures can be occult on conventional radiography. 2.  Mild cardiomegaly.  Tortuous thoracic aorta.   Original Report Authenticated By: Gaylyn Rong, M.D.      MDM  Pt has ride, does not have to drive. vicodin 1 po. zofran po. Xray.  Reviewed nursing notes and prior charts for additional history.   Pt still on her zithromax, 1 day left.  Recheck no increased wob. Appears stable for d/c.   Pt hasnt taken her normal bp med today, given dose in ed. No headache.      Suzi Roots, MD 10/03/12 1722  Suzi Roots, MD 10/03/12 [phone]

## 2012-10-07 DIAGNOSIS — H2510 Age-related nuclear cataract, unspecified eye: Secondary | ICD-10-CM | POA: Diagnosis not present

## 2012-10-10 ENCOUNTER — Encounter: Payer: Self-pay | Admitting: Family Medicine

## 2012-10-10 ENCOUNTER — Ambulatory Visit (INDEPENDENT_AMBULATORY_CARE_PROVIDER_SITE_OTHER): Payer: Medicare Other | Admitting: Family Medicine

## 2012-10-10 VITALS — BP 122/70 | HR 84 | Temp 98.5°F | Resp 20 | Wt 161.0 lb

## 2012-10-10 DIAGNOSIS — R531 Weakness: Secondary | ICD-10-CM

## 2012-10-10 DIAGNOSIS — R059 Cough, unspecified: Secondary | ICD-10-CM

## 2012-10-10 DIAGNOSIS — R5383 Other fatigue: Secondary | ICD-10-CM | POA: Diagnosis not present

## 2012-10-10 DIAGNOSIS — R05 Cough: Secondary | ICD-10-CM | POA: Diagnosis not present

## 2012-10-10 DIAGNOSIS — R5381 Other malaise: Secondary | ICD-10-CM

## 2012-10-10 NOTE — Progress Notes (Signed)
  Subjective:    Patient ID: Theresa Norton, female    DOB: January 18, 1929, 79 y.o.   MRN: [id number]  HPI  Patient seen with persistent cough and some weakness She's had a couple weeks of cough which has been severe at times. No syncope but a couple of episodes felt faint as if she were going to pass out with coughing. Went to urgent care treated with Zithromax. Subsequently went to the emergency department. X-rays did not reveal any rib fracture. Still has some left chest wall pain with coughing. No pleuritic pain. No hemoptysis. No fevers or chills. No significant dyspnea.  Generally feels weak at times. No chest pains. Has history of hypertension and history of second-degree AV block on calcium channel blocker  Past Medical History  Diagnosis Date  . Arthritis   . Allergy   . Hypertension   . Colon polyps   . Heart block AV second degree 12/13    after initiating Amlodipine   Past Surgical History  Procedure Laterality Date  . Breast surgery  1959    cyst removal  . Bunionectomy  1967    both feet    reports that she has never smoked. She does not have any smokeless tobacco history on file. Her alcohol and drug histories are not on file. family history includes Cancer in her father. Allergies  Allergen Reactions  . Codeine     GI upset  . Morphine And Related     GI upset     Review of Systems  Constitutional: Positive for fatigue.  HENT: Negative for trouble swallowing.   Respiratory: Positive for cough. Negative for shortness of breath and wheezing.   Cardiovascular: Negative for chest pain, palpitations and leg swelling.  Gastrointestinal: Negative for abdominal pain.  Endocrine: Negative for polydipsia and polyuria.  Genitourinary: Negative for dysuria.  Neurological: Positive for dizziness and weakness. Negative for syncope and headaches.       Objective:   Physical Exam  Constitutional: She appears well-developed and well-nourished.  HENT:  Right  Ear: External ear normal.  Left Ear: External ear normal.  Mouth/Throat: Oropharynx is clear and moist.  Neck: Neck supple. No thyromegaly present.  Cardiovascular: Normal rate.  Exam reveals no gallop.   Pulmonary/Chest: Effort normal and breath sounds normal. No respiratory distress. She has no wheezes. She has no rales.  Minimally tender left lateral chest wall region.  Musculoskeletal: She exhibits no edema.  Lymphadenopathy:    She has no cervical adenopathy.          Assessment & Plan:  Cough. She has some left chest wall pain which is likely secondary to her coughing. No evidence for rib fracture by recent x-rays. No fever or exam findings to suggest active infection. She is complaining of some generalized weakness. Check CBC and basic metabolic panel.

## 2012-10-11 LAB — CBC WITH DIFFERENTIAL/PLATELET
Basophils Absolute: 0.1 10*3/uL (ref 0.0–0.1)
Basophils Relative: 0.8 % (ref 0.0–3.0)
Eosinophils Absolute: 0.1 10*3/uL (ref 0.0–0.7)
Eosinophils Relative: 2.1 % (ref 0.0–5.0)
HCT: 38.2 % (ref 36.0–46.0)
Hemoglobin: 13 g/dL (ref 12.0–15.0)
Lymphocytes Relative: 21.7 % (ref 12.0–46.0)
Lymphs Abs: 1.5 10*3/uL (ref 0.7–4.0)
MCHC: 33.9 g/dL (ref 30.0–36.0)
MCV: 99.5 fl (ref 78.0–100.0)
Monocytes Absolute: 0.5 10*3/uL (ref 0.1–1.0)
Monocytes Relative: 7.7 % (ref 3.0–12.0)
Neutro Abs: 4.6 10*3/uL (ref 1.4–7.7)
Neutrophils Relative %: 67.7 % (ref 43.0–77.0)
Platelets: 309 10*3/uL (ref 150.0–400.0)
RBC: 3.84 Mil/uL — ABNORMAL LOW (ref 3.87–5.11)
RDW: 13.3 % (ref 11.5–14.6)
WBC: 6.8 10*3/uL (ref 4.5–10.5)

## 2012-10-11 LAB — BASIC METABOLIC PANEL
BUN: 20 mg/dL (ref 6–23)
CO2: 25 mEq/L (ref 19–32)
Calcium: 9.2 mg/dL (ref 8.4–10.5)
Chloride: 99 mEq/L (ref 96–112)
Creatinine, Ser: 0.9 mg/dL (ref 0.4–1.2)
GFR: 64.52 mL/min (ref >60.00)
Glucose, Bld: 86 mg/dL (ref 70–99)
Potassium: 4.3 mEq/L (ref 3.5–5.1)
Sodium: 134 mEq/L — ABNORMAL LOW (ref 135–145)

## 2012-10-14 NOTE — Progress Notes (Signed)
Quick Note:  Pt informed ______ 

## 2012-10-17 DIAGNOSIS — H2589 Other age-related cataract: Secondary | ICD-10-CM | POA: Diagnosis not present

## 2012-10-17 DIAGNOSIS — I10 Essential (primary) hypertension: Secondary | ICD-10-CM | POA: Diagnosis not present

## 2012-10-29 DIAGNOSIS — H2589 Other age-related cataract: Secondary | ICD-10-CM | POA: Diagnosis not present

## 2012-10-29 DIAGNOSIS — I10 Essential (primary) hypertension: Secondary | ICD-10-CM | POA: Diagnosis not present

## 2012-10-30 DIAGNOSIS — Z961 Presence of intraocular lens: Secondary | ICD-10-CM | POA: Diagnosis not present

## 2012-10-30 DIAGNOSIS — Z48810 Encounter for surgical aftercare following surgery on the sense organs: Secondary | ICD-10-CM | POA: Diagnosis not present

## 2012-11-04 DIAGNOSIS — E559 Vitamin D deficiency, unspecified: Secondary | ICD-10-CM | POA: Diagnosis not present

## 2012-11-04 DIAGNOSIS — D509 Iron deficiency anemia, unspecified: Secondary | ICD-10-CM | POA: Diagnosis not present

## 2012-11-04 DIAGNOSIS — R7989 Other specified abnormal findings of blood chemistry: Secondary | ICD-10-CM | POA: Diagnosis not present

## 2012-11-04 DIAGNOSIS — I10 Essential (primary) hypertension: Secondary | ICD-10-CM | POA: Diagnosis not present

## 2012-11-07 DIAGNOSIS — H353290 Exudative age-related macular degeneration, unspecified eye, stage unspecified: Secondary | ICD-10-CM | POA: Diagnosis not present

## 2012-11-07 DIAGNOSIS — Z961 Presence of intraocular lens: Secondary | ICD-10-CM | POA: Diagnosis not present

## 2012-11-07 DIAGNOSIS — H353190 Nonexudative age-related macular degeneration, unspecified eye, stage unspecified: Secondary | ICD-10-CM | POA: Diagnosis not present

## 2012-11-18 ENCOUNTER — Encounter: Payer: Self-pay | Admitting: Family Medicine

## 2012-11-18 ENCOUNTER — Ambulatory Visit (INDEPENDENT_AMBULATORY_CARE_PROVIDER_SITE_OTHER): Payer: Medicare Other | Admitting: Family Medicine

## 2012-11-18 VITALS — BP 140/90 | HR 71 | Temp 98.5°F | Wt 160.0 lb

## 2012-11-18 DIAGNOSIS — R05 Cough: Secondary | ICD-10-CM | POA: Diagnosis not present

## 2012-11-18 DIAGNOSIS — I10 Essential (primary) hypertension: Secondary | ICD-10-CM

## 2012-11-18 DIAGNOSIS — R059 Cough, unspecified: Secondary | ICD-10-CM | POA: Diagnosis not present

## 2012-11-18 MED ORDER — LOSARTAN POTASSIUM 100 MG PO TABS: 100.0000 mg | ORAL_TABLET | Freq: Every day | ORAL | Status: DC

## 2012-11-18 NOTE — Patient Instructions (Addendum)
Stop Lisinopril and start Losartan one daily Touch base in 2 weeks if cough no better.

## 2012-11-18 NOTE — Progress Notes (Signed)
  Subjective:    Patient ID: Theresa Norton, female    DOB: January 20, 1929, 79 y.o.   MRN: [id number]  HPI Persistent cough. Patient's been on ACE inhibitor with lisinopril for several months She presented here several months ago with ?Wenkebach second-degree A-V type block on amlodipine. After discontinuation of calcium channel blocker her EKG resorted back to normal. We had consulted with cardiology to review her EKG that time and they had recommended discontinuation of amlodipine. Her blood pressures been well controlled lisinopril but she has had a dry cough for several weeks now. Occasional postnasal drip. No fevers or chills. No dyspnea. Nonsmoker. No appetite or weight change. No hemoptysis. No pleuritic pain. No active GERD symptoms  Past Medical History  Diagnosis Date  . Arthritis   . Allergy   . Hypertension   . Colon polyps   . Heart block AV second degree 12/13    after initiating Amlodipine   Past Surgical History  Procedure Laterality Date  . Breast surgery  1959    cyst removal  . Bunionectomy  1967    both feet    reports that she has never smoked. She does not have any smokeless tobacco history on file. Her alcohol and drug histories are not on file. family history includes Cancer in her father. Allergies  Allergen Reactions  . Codeine     GI upset  . Morphine And Related     GI upset      Review of Systems  Constitutional: Negative for fever, chills, appetite change and unexpected weight change.  HENT: Positive for postnasal drip. Negative for sinus pressure.   Respiratory: Positive for cough. Negative for shortness of breath and wheezing.   Neurological: Negative for dizziness.       Objective:   Physical Exam  Constitutional: She appears well-developed and well-nourished.  HENT:  Mouth/Throat: Oropharynx is clear and moist.  Neck: Neck supple.  Cardiovascular: Normal rate and regular rhythm.   Pulmonary/Chest: Effort normal and breath sounds  normal. No respiratory distress. She has no wheezes. She has no rales.  Musculoskeletal: She exhibits no edema.  Lymphadenopathy:    She has no cervical adenopathy.          Assessment & Plan:  Persistent dry cough. Suspect related ACE inhibitor. Discontinue lisinopril and start losartan 100 mg once daily. Touch base 2 weeks if cough not resolving.

## 2012-11-19 DIAGNOSIS — I10 Essential (primary) hypertension: Secondary | ICD-10-CM | POA: Diagnosis not present

## 2012-12-11 DIAGNOSIS — H26499 Other secondary cataract, unspecified eye: Secondary | ICD-10-CM | POA: Insufficient documentation

## 2012-12-19 DIAGNOSIS — H353190 Nonexudative age-related macular degeneration, unspecified eye, stage unspecified: Secondary | ICD-10-CM | POA: Diagnosis not present

## 2012-12-19 DIAGNOSIS — Z961 Presence of intraocular lens: Secondary | ICD-10-CM | POA: Diagnosis not present

## 2012-12-19 DIAGNOSIS — H353290 Exudative age-related macular degeneration, unspecified eye, stage unspecified: Secondary | ICD-10-CM | POA: Diagnosis not present

## 2013-01-20 ENCOUNTER — Encounter: Payer: Self-pay | Admitting: Family Medicine

## 2013-01-20 ENCOUNTER — Ambulatory Visit (INDEPENDENT_AMBULATORY_CARE_PROVIDER_SITE_OTHER): Payer: Medicare Other | Admitting: Family Medicine

## 2013-01-20 VITALS — BP 198/80 | HR 83 | Temp 98.1°F | Wt 161.0 lb

## 2013-01-20 DIAGNOSIS — H6120 Impacted cerumen, unspecified ear: Secondary | ICD-10-CM

## 2013-01-20 DIAGNOSIS — I10 Essential (primary) hypertension: Secondary | ICD-10-CM

## 2013-01-20 DIAGNOSIS — H6123 Impacted cerumen, bilateral: Secondary | ICD-10-CM

## 2013-01-20 MED ORDER — LOSARTAN POTASSIUM 100 MG PO TABS: 100.0000 mg | ORAL_TABLET | Freq: Every day | ORAL | Status: DC

## 2013-01-20 NOTE — Progress Notes (Signed)
  Subjective:    Patient ID: Theresa Norton, female    DOB: 18-Oct-1928, 79 y.o.   MRN: [id number]  HPI Patient here for medical followup. She has been on losartan 100 mg and tolerating well. This was controlling her blood pressure fairly well but she apparently misplaced her medication and has not been on this for several weeks now. She denies any headaches or dizziness. No chest pains. Blood pressures been extremely elevated in the past.  She previously took amlodipine but had second degree type heart block and this was discontinued and she was switched to lisinopril. She then developed cough with lisinopril.  She has tolerated losartan without difficulty.  She complains of new problem of right and left ear fullness over the past week or so. Possibly some decreased hearing left ear. No vertigo. No ear pain or drainage.  Patient declines flu vaccine today  Past Medical History  Diagnosis Date  . Arthritis   . Allergy   . Hypertension   . Colon polyps   . Heart block AV second degree 12/13    after initiating Amlodipine   Past Surgical History  Procedure Laterality Date  . Breast surgery  1959    cyst removal  . Bunionectomy  1967    both feet    reports that she has never smoked. She does not have any smokeless tobacco history on file. Her alcohol and drug histories are not on file. family history includes Cancer in her father. Allergies  Allergen Reactions  . Codeine     GI upset  . Morphine And Related     GI upset      Review of Systems  Constitutional: Negative for fatigue.  HENT: Positive for hearing loss. Negative for ear pain and ear discharge.   Eyes: Negative for visual disturbance.  Respiratory: Negative for cough, chest tightness, shortness of breath and wheezing.   Cardiovascular: Negative for chest pain, palpitations and leg swelling.  Neurological: Negative for dizziness, seizures, syncope, weakness, light-headedness and headaches.       Objective:   Physical Exam  Constitutional: She appears well-developed and well-nourished.  HENT:  Cerumen impaction bilaterally  Neck: Neck supple. No thyromegaly present.  Cardiovascular: Normal rate and regular rhythm.   Pulmonary/Chest: Effort normal and breath sounds normal. No respiratory distress. She has no wheezes. She has no rales.  Musculoskeletal: She exhibits no edema.          Assessment & Plan:  #1 hypertension. Poorly controlled. Start back losartan 100 mg daily. Reassess in 3-4 weeks #2 cerumen impaction. Irrigation of both ears #3 health maintenance. We've highly recommended flu vaccine she declines

## 2013-02-13 DIAGNOSIS — H353190 Nonexudative age-related macular degeneration, unspecified eye, stage unspecified: Secondary | ICD-10-CM | POA: Diagnosis not present

## 2013-02-13 DIAGNOSIS — Z961 Presence of intraocular lens: Secondary | ICD-10-CM | POA: Diagnosis not present

## 2013-02-13 DIAGNOSIS — H353290 Exudative age-related macular degeneration, unspecified eye, stage unspecified: Secondary | ICD-10-CM | POA: Diagnosis not present

## 2013-02-14 DIAGNOSIS — H2640 Unspecified secondary cataract: Secondary | ICD-10-CM | POA: Diagnosis not present

## 2013-02-18 DIAGNOSIS — I10 Essential (primary) hypertension: Secondary | ICD-10-CM | POA: Diagnosis not present

## 2013-02-20 ENCOUNTER — Encounter: Payer: Self-pay | Admitting: Family Medicine

## 2013-02-20 ENCOUNTER — Ambulatory Visit (INDEPENDENT_AMBULATORY_CARE_PROVIDER_SITE_OTHER): Payer: Medicare Other | Admitting: Family Medicine

## 2013-02-20 VITALS — BP 142/78 | HR 73 | Temp 97.9°F | Wt 163.0 lb

## 2013-02-20 DIAGNOSIS — I10 Essential (primary) hypertension: Secondary | ICD-10-CM | POA: Diagnosis not present

## 2013-02-20 NOTE — Progress Notes (Signed)
  Subjective:    Patient ID: Theresa Norton, female    DOB: 1928-07-13, 79 y.o.   MRN: [id number]  HPI Followup hypertension We reinitiated losartan 100 mg daily last visit. She is tolerating with no side effects. No headaches. No dizziness. Denies any chest pains.  She tries to watch sodium intake. Occasional wine but not in excess. No recent peripheral edema issues.  She has a wrist home blood pressure monitor and usually getting around 140-150 systolic there. Previous intolerance to calcium channel blocker with possible associated Wenckebach second degree AV block  Past Medical History  Diagnosis Date  . Arthritis   . Allergy   . Hypertension   . Colon polyps   . Heart block AV second degree 12/13    after initiating Amlodipine   Past Surgical History  Procedure Laterality Date  . Breast surgery  1959    cyst removal  . Bunionectomy  1967    both feet    reports that she has never smoked. She does not have any smokeless tobacco history on file. Her alcohol and drug histories are not on file. family history includes Cancer in her father. Allergies  Allergen Reactions  . Codeine     GI upset  . Morphine And Related     GI upset     Review of Systems  Constitutional: Negative for fatigue and unexpected weight change.  Eyes: Negative for visual disturbance.  Respiratory: Negative for cough, chest tightness, shortness of breath and wheezing.   Cardiovascular: Negative for chest pain, palpitations and leg swelling.  Neurological: Negative for dizziness, seizures, syncope, weakness, light-headedness and headaches.       Objective:   Physical Exam  Constitutional: She appears well-developed and well-nourished.  Cardiovascular: Normal rate and regular rhythm.   Pulmonary/Chest: Effort normal and breath sounds normal. No respiratory distress. She has no wheezes. She has no rales.  Musculoskeletal: She exhibits no edema.          Assessment & Plan:   Hypertension. Improved. Continue close home monitoring. Bring her blood pressure cuff to compare her cuff with ours next visit. Discussed sodium reduction. Regular aerobic exercise

## 2013-02-20 NOTE — Patient Instructions (Signed)
Bring your blood pressure cuff at next follow up visit. Watch salt/sodium intake

## 2013-03-24 ENCOUNTER — Encounter: Payer: Self-pay | Admitting: Family Medicine

## 2013-03-24 ENCOUNTER — Telehealth: Payer: Self-pay | Admitting: Family Medicine

## 2013-03-24 ENCOUNTER — Ambulatory Visit (INDEPENDENT_AMBULATORY_CARE_PROVIDER_SITE_OTHER): Payer: Medicare Other | Admitting: Family Medicine

## 2013-03-24 VITALS — BP 166/90 | HR 76 | Temp 97.9°F | Wt 177.0 lb

## 2013-03-24 DIAGNOSIS — S060X9A Concussion with loss of consciousness of unspecified duration, initial encounter: Secondary | ICD-10-CM

## 2013-03-24 DIAGNOSIS — S0083XA Contusion of other part of head, initial encounter: Secondary | ICD-10-CM

## 2013-03-24 DIAGNOSIS — S0003XA Contusion of scalp, initial encounter: Secondary | ICD-10-CM | POA: Diagnosis not present

## 2013-03-24 DIAGNOSIS — S069X9A Unspecified intracranial injury with loss of consciousness of unspecified duration, initial encounter: Secondary | ICD-10-CM

## 2013-03-24 DIAGNOSIS — I10 Essential (primary) hypertension: Secondary | ICD-10-CM | POA: Diagnosis not present

## 2013-03-24 NOTE — Telephone Encounter (Signed)
Pt denies, confusion, headaches, nausea/vomitting. Appt scheduled to see PCP today at 3:30pm.

## 2013-03-24 NOTE — Telephone Encounter (Signed)
Spoke with nurse. I recommended ED evaluation immediately for any ongoing confusion, headaches, nausea/vomiting.  Otherwise, we can assess here.

## 2013-03-24 NOTE — Patient Instructions (Signed)
Head Injury, Adult You have had a head injury that does not appear serious at this time. A concussion is a state of changed mental ability, usually from a blow to the head. You should take clear liquids for the rest of the day and then resume your regular diet. You should not take sedatives or alcoholic beverages for as long as directed by your caregiver after discharge. After injuries such as yours, most problems occur within the first 24 hours. SYMPTOMS These minor symptoms may be experienced after discharge:  Memory difficulties.  Dizziness.  Headaches.  Double vision.  Hearing difficulties.  Depression.  Tiredness.  Weakness.  Difficulty with concentration. If you experience any of these problems, you should not be alarmed. A concussion requires a few days for recovery. Many patients with head injuries frequently experience such symptoms. Usually, these problems disappear without medical care. If symptoms last for more than one day, notify your caregiver. See your caregiver sooner if symptoms are becoming worse rather than better. HOME CARE INSTRUCTIONS   During the next 24 hours you must stay with someone who can watch you for the warning signs listed below. Although it is unlikely that serious side effects will occur, you should be aware of signs and symptoms which may necessitate your return to this location. Side effects may occur up to 7  10 days following the injury. It is important for you to carefully monitor your condition and contact your caregiver or seek immediate medical attention if there is a change in your condition. SEEK IMMEDIATE MEDICAL CARE IF:   There is confusion or drowsiness.  You can not awaken the injured person.  There is nausea (feeling sick to your stomach) or continued, forceful vomiting.  You notice dizziness or unsteadiness which is getting worse, or inability to walk.  You have convulsions or unconsciousness.  You experience severe,  persistent headaches not relieved by over-the-counter or prescription medicines for pain. (Do not take aspirin as this impairs clotting abilities). Take other pain medications only as directed.  You can not use arms or legs normally.  There is clear or bloody discharge from the nose or ears. MAKE SURE YOU:   Understand these instructions.  Will watch your condition.  Will get help right away if you are not doing well or get worse. Document Released: 04/24/2005 Document Revised: 07/17/2011 Document Reviewed: 03/12/2009 [facility] Patient Information 2014 [place], [place]. Hematoma A hematoma is a collection of blood under the skin, in an organ, in a body space, in a joint space, or in other tissue. The blood can clot to form a lump that you can see and feel. The lump is often firm and may sometimes become sore and tender. Most hematomas get better in a few days to weeks. However, some hematomas may be serious and require medical care. Hematomas can range in size from very small to very large. CAUSES  A hematoma can be caused by a blunt or penetrating injury. It can also be caused by spontaneous leakage from a blood vessel under the skin. Spontaneous leakage from a blood vessel is more likely to occur in older people, especially those taking blood thinners. Sometimes, a hematoma can develop after certain medical procedures. SIGNS AND SYMPTOMS   A firm lump on the body.  Possible pain and tenderness in the area.  Bruising.Blue, dark blue, purple-red, or yellowish skin may appear at the site of the hematoma if the hematoma is close to the surface of the skin. For hematomas in  deeper tissues or body spaces, the signs and symptoms may be subtle. For example, an intra-abdominal hematoma may cause abdominal pain, weakness, fainting, and shortness of breath. An intracranial hematoma may cause a headache or symptoms such as weakness, trouble speaking, or a change in consciousness. DIAGNOSIS  A  hematoma can usually be diagnosed based on your medical history and a physical exam. Imaging tests may be needed if your health care provider suspects a hematoma in deeper tissues or body spaces, such as the abdomen, head, or chest. These tests may include ultrasonography or a CT scan.  TREATMENT  Hematomas usually go away on their own over time. Rarely does the blood need to be drained out of the body. Large hematomas or those that may affect vital organs will sometimes need surgical drainage or monitoring. HOME CARE INSTRUCTIONS   Apply ice to the injured area:   Put ice in a plastic bag.   Place a towel between your skin and the bag.   Leave the ice on for 20 minutes, 2 3 times a day for the first 1 to 2 days.   After the first 2 days, switch to using warm compresses on the hematoma.   Elevate the injured area to help decrease pain and swelling. Wrapping the area with an elastic bandage may also be helpful. Compression helps to reduce swelling and promotes shrinking of the hematoma. Make sure the bandage is not wrapped too tight.   If your hematoma is on a lower extremity and is painful, crutches may be helpful for a couple days.   Only take over-the-counter or prescription medicines as directed by your health care provider. SEEK IMMEDIATE MEDICAL CARE IF:   You have increasing pain, or your pain is not controlled with medicine.   You have a fever.   You have worsening swelling or discoloration.   Your skin over the hematoma breaks or starts bleeding.   Your hematoma is in your chest or abdomen and you have weakness, shortness of breath, or a change in consciousness.  Your hematoma is on your scalp (caused by a fall or injury) and you have a worsening headache or a change in alertness or consciousness. MAKE SURE YOU:   Understand these instructions.  Will watch your condition.  Will get help right away if you are not doing well or get worse. Document Released:  12/07/2003 Document Revised: 12/25/2012 Document Reviewed: 10/02/2012 [facility] Patient Information 2014 [place], [place].  Get blood pressure cuff (arm and not wrist).

## 2013-03-24 NOTE — Progress Notes (Signed)
  Subjective:    Patient ID: Theresa Norton, female    DOB: 05-28-1928, 79 y.o.   MRN: [id number]  HPI Patient had recent fall. This occurred early Sunday morning-estimated around 12 MN. She apparently tripped over a cord and fell forward and hit her head and struck the bridge of her nose. She may have been out for some length of time but was not sure. She had some bleeding and hematoma right for head.  She did not call until his morning. She has not had any headache and denies any nausea or vomiting or confusion. No lethargy. She has extensive bruising around both eyes and hematoma right forehead otherwise feels fine. Denies any other recent falls. No focal weakness.  Hypertension with suspected white coat syndrome. She has wrist cuff which she brings in today. Here, we are getting very inconsistent readings. Previous hyponatremia with HCTZ. Currently takes losartan. History of Josue Hector second degree type heart block with calcium channel blocker-Amlodipine.  Past Medical History  Diagnosis Date  . Arthritis   . Allergy   . Hypertension   . Colon polyps   . Heart block AV second degree 12/13    after initiating Amlodipine   Past Surgical History  Procedure Laterality Date  . Breast surgery  1959    cyst removal  . Bunionectomy  1967    both feet    reports that she has never smoked. She does not have any smokeless tobacco history on file. Her alcohol and drug histories are not on file. family history includes Cancer in her father. Allergies  Allergen Reactions  . Codeine     GI upset  . Morphine And Related     GI upset      Review of Systems  Constitutional: Negative for fever, chills and fatigue.  Gastrointestinal: Negative for nausea and vomiting.  Neurological: Negative for dizziness, seizures, syncope, weakness and headaches.  Psychiatric/Behavioral: Negative for confusion.       Objective:   Physical Exam  Constitutional: She is oriented to person, place,  and time. She appears well-developed and well-nourished.  HENT:  Right Ear: External ear normal.  Left Ear: External ear normal.  Patient has extensive ecchymosis around both eyes. She has soft nontender hematoma right forehead  Eyes: Pupils are equal, round, and reactive to light.  Neck: Neck supple.  Cardiovascular: Normal rate and regular rhythm.   Pulmonary/Chest: Effort normal and breath sounds normal. No respiratory distress. She has no wheezes. She has no rales.  Neurological: She is alert and oriented to person, place, and time. No cranial nerve deficit. Coordination normal.          Assessment & Plan:  Status post fall with reported loss of consciousness. She has right forehead hematoma and extensive facial ecchymosis. She has had no concerning features/red flags such as headache, confusion, lethargy, nausea or vomiting. Neuro exam is nonfocal. Head injury sheet given. Followup promptly for any changes.  Hypertension which is somewhat elevated today. She will obtain home blood pressure cuff (arm and not wrist) and she has scheduled followup early December and bring readings in then to review.  Continue Losartan.

## 2013-03-24 NOTE — Progress Notes (Signed)
Pre visit review using our clinic review tool, if applicable. No additional management support is needed unless otherwise documented below in the visit note. 

## 2013-03-24 NOTE — Telephone Encounter (Signed)
Patient Information:  Caller Name: Angell  Phone: [phone]  Patient: Theresa Norton, Theresa Norton  Gender: Female  DOB: [birth year]  Age: 79 Years  PCP: Evelena Peat (Family Practice)  Office Follow Up:  Does the office need to follow up with this patient?: Yes  Instructions For The Office: Contacted Suandrea at office and was instructed to send note for MD review.   Symptoms  Reason For Call & Symptoms: Patient calling. Relates she tripped  and fell 03/22/13, hit the fireplace; she relates she was not aware of surroundings for estimated 2 hours.  She had nosebleed and hit her head.  Now she has bruising on face.  She did not go to ED.  Per Head Injury protocol, 911 should be dispatched.  Caller is alert, oriented and ambulatory and has been after the initial episode.  Denies balance problems. She is oriented to time, place and person; relates she was reviewing emails during the conversation.  Contacted Suandrea at office and was instructed to send note for review by MD.  Reviewed Health History In EMR: Yes  Reviewed Medications In EMR: Yes  Reviewed Allergies In EMR: Yes  Reviewed Surgeries / Procedures: Yes  Date of Onset of Symptoms: 03/22/2013  Treatments Tried: Ice packs and pressure stopped nose bleed  Treatments Tried Worked: Yes  Guideline(s) Used:  Head Injury  Disposition Per Guideline:   Call EMS 911 Now  Reason For Disposition Reached:   Knocked out (unconscious) > 1 minute  Advice Given:  N/A  RN Overrode Recommendation:  Document Patient  Contacted Suandrea at office and was instructed to send note for MD review.

## 2013-04-14 DIAGNOSIS — D509 Iron deficiency anemia, unspecified: Secondary | ICD-10-CM | POA: Diagnosis not present

## 2013-04-14 DIAGNOSIS — E559 Vitamin D deficiency, unspecified: Secondary | ICD-10-CM | POA: Diagnosis not present

## 2013-04-14 DIAGNOSIS — I10 Essential (primary) hypertension: Secondary | ICD-10-CM | POA: Diagnosis not present

## 2013-04-14 DIAGNOSIS — R7989 Other specified abnormal findings of blood chemistry: Secondary | ICD-10-CM | POA: Diagnosis not present

## 2013-04-14 DIAGNOSIS — E782 Mixed hyperlipidemia: Secondary | ICD-10-CM | POA: Diagnosis not present

## 2013-04-22 ENCOUNTER — Ambulatory Visit (INDEPENDENT_AMBULATORY_CARE_PROVIDER_SITE_OTHER): Payer: Medicare Other | Admitting: Family Medicine

## 2013-04-22 ENCOUNTER — Encounter: Payer: Self-pay | Admitting: Family Medicine

## 2013-04-22 VITALS — BP 140/76 | HR 88 | Temp 98.4°F | Wt 165.0 lb

## 2013-04-22 DIAGNOSIS — I10 Essential (primary) hypertension: Secondary | ICD-10-CM

## 2013-04-22 DIAGNOSIS — E669 Obesity, unspecified: Secondary | ICD-10-CM | POA: Insufficient documentation

## 2013-04-22 NOTE — Progress Notes (Signed)
   Subjective:    Patient ID: Theresa Norton, female    DOB: 1928-11-30, 79 y.o.   MRN: [id number]  HPI Patient seen for followup regarding hypertension Last visit, she had fallen and had large hematoma on her face. She has done extremely well since then. She has not had any balance problems and had actually tripled she fail then. No headaches. No dizziness.  Remains on losartan 100 mg daily for hypertension. Blood pressures been well controlled. She is getting consistently between 130 and 140 systolic by readings outside of this office. Compliant with therapy  We discussed preventative vaccine such as pneumonia vaccine and she refuses.  Past Medical History  Diagnosis Date  . Arthritis   . Allergy   . Hypertension   . Colon polyps   . Heart block AV second degree 12/13    after initiating Amlodipine   Past Surgical History  Procedure Laterality Date  . Breast surgery  1959    cyst removal  . Bunionectomy  1967    both feet    reports that she has never smoked. She does not have any smokeless tobacco history on file. Her alcohol and drug histories are not on file. family history includes Cancer in her father. Allergies  Allergen Reactions  . Codeine     GI upset  . Morphine And Related     GI upset      Review of Systems  Constitutional: Negative for fatigue and unexpected weight change.  Eyes: Negative for visual disturbance.  Respiratory: Negative for cough, chest tightness, shortness of breath and wheezing.   Cardiovascular: Negative for chest pain, palpitations and leg swelling.  Neurological: Negative for dizziness, seizures, syncope, weakness, light-headedness and headaches.       Objective:   Physical Exam  Constitutional: She appears well-developed and well-nourished.  Cardiovascular: Normal rate and regular rhythm.  Exam reveals no gallop.   Pulmonary/Chest: Effort normal and breath sounds normal. No respiratory distress. She has no wheezes. She has no  rales.  Musculoskeletal: She exhibits no edema.          Assessment & Plan:  Hypertension. Decent control by home readings. Continue current medication. Continue close monitoring. We offered pneumonia vaccine and she declines. Routine followup 6 months

## 2013-04-22 NOTE — Progress Notes (Signed)
Pre visit review using our clinic review tool, if applicable. No additional management support is needed unless otherwise documented below in the visit note. 

## 2013-04-24 DIAGNOSIS — H353290 Exudative age-related macular degeneration, unspecified eye, stage unspecified: Secondary | ICD-10-CM | POA: Diagnosis not present

## 2013-04-24 DIAGNOSIS — Z961 Presence of intraocular lens: Secondary | ICD-10-CM | POA: Diagnosis not present

## 2013-04-24 DIAGNOSIS — H353190 Nonexudative age-related macular degeneration, unspecified eye, stage unspecified: Secondary | ICD-10-CM | POA: Diagnosis not present

## 2013-05-08 HISTORY — PX: INSERT / REPLACE / REMOVE PACEMAKER: SUR710

## 2013-05-13 DIAGNOSIS — I10 Essential (primary) hypertension: Secondary | ICD-10-CM | POA: Diagnosis not present

## 2013-05-26 ENCOUNTER — Encounter (HOSPITAL_COMMUNITY): Payer: Self-pay | Admitting: Emergency Medicine

## 2013-05-26 ENCOUNTER — Telehealth: Payer: Self-pay | Admitting: Family Medicine

## 2013-05-26 ENCOUNTER — Inpatient Hospital Stay (HOSPITAL_COMMUNITY)
Admission: EM | Admit: 2013-05-26 | Discharge: 2013-05-29 | DRG: 243 | Disposition: A | Discharge status: Home Health | Payer: Medicare Other | Attending: Cardiovascular Disease | Admitting: Cardiovascular Disease

## 2013-05-26 DIAGNOSIS — Z79899 Other long term (current) drug therapy: Secondary | ICD-10-CM | POA: Diagnosis not present

## 2013-05-26 DIAGNOSIS — Z7982 Long term (current) use of aspirin: Secondary | ICD-10-CM | POA: Diagnosis not present

## 2013-05-26 DIAGNOSIS — I442 Atrioventricular block, complete: Secondary | ICD-10-CM | POA: Diagnosis not present

## 2013-05-26 DIAGNOSIS — Y840 Cardiac catheterization as the cause of abnormal reaction of the patient, or of later complication, without mention of misadventure at the time of the procedure: Secondary | ICD-10-CM | POA: Diagnosis not present

## 2013-05-26 DIAGNOSIS — D509 Iron deficiency anemia, unspecified: Secondary | ICD-10-CM | POA: Diagnosis not present

## 2013-05-26 DIAGNOSIS — IMO0002 Reserved for concepts with insufficient information to code with codable children: Secondary | ICD-10-CM | POA: Diagnosis not present

## 2013-05-26 DIAGNOSIS — I10 Essential (primary) hypertension: Secondary | ICD-10-CM | POA: Diagnosis present

## 2013-05-26 DIAGNOSIS — D500 Iron deficiency anemia secondary to blood loss (chronic): Secondary | ICD-10-CM | POA: Diagnosis present

## 2013-05-26 DIAGNOSIS — Z888 Allergy status to other drugs, medicaments and biological substances status: Secondary | ICD-10-CM | POA: Diagnosis not present

## 2013-05-26 DIAGNOSIS — F29 Unspecified psychosis not due to a substance or known physiological condition: Secondary | ICD-10-CM | POA: Diagnosis not present

## 2013-05-26 DIAGNOSIS — Z801 Family history of malignant neoplasm of trachea, bronchus and lung: Secondary | ICD-10-CM | POA: Diagnosis not present

## 2013-05-26 DIAGNOSIS — Z950 Presence of cardiac pacemaker: Secondary | ICD-10-CM | POA: Diagnosis not present

## 2013-05-26 DIAGNOSIS — I498 Other specified cardiac arrhythmias: Secondary | ICD-10-CM | POA: Diagnosis not present

## 2013-05-26 DIAGNOSIS — R42 Dizziness and giddiness: Secondary | ICD-10-CM | POA: Diagnosis not present

## 2013-05-26 DIAGNOSIS — I2510 Atherosclerotic heart disease of native coronary artery without angina pectoris: Secondary | ICD-10-CM | POA: Diagnosis present

## 2013-05-26 DIAGNOSIS — F411 Generalized anxiety disorder: Secondary | ICD-10-CM | POA: Diagnosis present

## 2013-05-26 DIAGNOSIS — Y9210 Unspecified residential institution as the place of occurrence of the external cause: Secondary | ICD-10-CM | POA: Diagnosis not present

## 2013-05-26 DIAGNOSIS — I517 Cardiomegaly: Secondary | ICD-10-CM | POA: Diagnosis not present

## 2013-05-26 LAB — BASIC METABOLIC PANEL
BUN: 13 mg/dL (ref 6–23)
CO2: 21 mEq/L (ref 19–32)
Calcium: 9.2 mg/dL (ref 8.4–10.5)
Chloride: 98 mEq/L (ref 96–112)
Creatinine, Ser: 0.72 mg/dL (ref 0.50–1.10)
GFR calc Af Amer: >90 mL/min (ref >90)
GFR calc non Af Amer: 78 mL/min — ABNORMAL LOW (ref >90)
Glucose, Bld: 117 mg/dL — ABNORMAL HIGH (ref 70–99)
Potassium: 4 mEq/L (ref 3.7–5.3)
Sodium: 136 mEq/L — ABNORMAL LOW (ref 137–147)

## 2013-05-26 LAB — CBC
HCT: 39.5 % (ref 36.0–46.0)
HCT: 37.5 % (ref 36.0–46.0)
Hemoglobin: 14 g/dL (ref 12.0–15.0)
Hemoglobin: 13.2 g/dL (ref 12.0–15.0)
MCH: 33.4 pg (ref 26.0–34.0)
MCH: 33.6 pg (ref 26.0–34.0)
MCHC: 35.4 g/dL (ref 30.0–36.0)
MCHC: 35.2 g/dL (ref 30.0–36.0)
MCV: 94.3 fL (ref 78.0–100.0)
MCV: 95.4 fL (ref 78.0–100.0)
Platelets: 202 10*3/uL (ref 150–400)
Platelets: 197 10*3/uL (ref 150–400)
RBC: 4.19 MIL/uL (ref 3.87–5.11)
RBC: 3.93 MIL/uL (ref 3.87–5.11)
RDW: 13.5 % (ref 11.5–15.5)
RDW: 13.4 % (ref 11.5–15.5)
WBC: 5.3 10*3/uL (ref 4.0–10.5)
WBC: 4.6 10*3/uL (ref 4.0–10.5)

## 2013-05-26 LAB — POCT I-STAT TROPONIN I: Troponin i, poc: 0 ng/mL (ref 0.00–0.08)

## 2013-05-26 MED ORDER — ATROPINE SULFATE 0.1 MG/ML IJ SOLN
0.5000 mg | Freq: Once | INTRAMUSCULAR | Status: AC
  Administered 2013-05-26: 0.5 mg via INTRAVENOUS
  Filled 2013-05-26: qty 10

## 2013-05-26 MED ORDER — ATROPINE SULFATE 0.1 MG/ML IJ SOLN
0.5000 mg | Freq: Once | INTRAMUSCULAR | Status: AC
  Administered 2013-05-26: 0.5 mg via INTRAVENOUS

## 2013-05-26 MED ORDER — ADULT MULTIVITAMIN W/MINERALS CH
1.0000 | ORAL_TABLET | Freq: Every day | ORAL | Status: DC
  Administered 2013-05-27 – 2013-05-29 (×4): 1 via ORAL
  Filled 2013-05-26 (×4): qty 1

## 2013-05-26 MED ORDER — ACETAMINOPHEN 650 MG RE SUPP: 650.0000 mg | Freq: Four times a day (QID) | RECTAL | Status: DC | PRN

## 2013-05-26 MED ORDER — SODIUM CHLORIDE 0.9 % IJ SOLN: 3.0000 mL | INTRAMUSCULAR | Status: DC | PRN

## 2013-05-26 MED ORDER — ASPIRIN EC 81 MG PO TBEC
81.0000 mg | DELAYED_RELEASE_TABLET | Freq: Every day | ORAL | Status: DC
  Administered 2013-05-27 – 2013-05-29 (×3): 81 mg via ORAL
  Filled 2013-05-26 (×4): qty 1

## 2013-05-26 MED ORDER — SODIUM CHLORIDE 0.9 % IJ SOLN
3.0000 mL | Freq: Two times a day (BID) | INTRAMUSCULAR | Status: DC
  Administered 2013-05-27: 3 mL via INTRAVENOUS

## 2013-05-26 MED ORDER — ONDANSETRON HCL 4 MG PO TABS: 4.0000 mg | ORAL_TABLET | Freq: Four times a day (QID) | ORAL | Status: DC | PRN

## 2013-05-26 MED ORDER — HEPARIN SODIUM (PORCINE) 5000 UNIT/ML IJ SOLN
5000.0000 [IU] | Freq: Three times a day (TID) | INTRAMUSCULAR | Status: DC
  Administered 2013-05-27: 5000 [IU] via SUBCUTANEOUS
  Filled 2013-05-26 (×5): qty 1

## 2013-05-26 MED ORDER — SODIUM CHLORIDE 0.9 % IV SOLN: 250.0000 mL | INTRAVENOUS | Status: DC | PRN

## 2013-05-26 MED ORDER — ACETAMINOPHEN 325 MG PO TABS: 650.0000 mg | ORAL_TABLET | Freq: Four times a day (QID) | ORAL | Status: DC | PRN

## 2013-05-26 MED ORDER — ONDANSETRON HCL 4 MG/2ML IJ SOLN: 4.0000 mg | Freq: Four times a day (QID) | INTRAMUSCULAR | Status: DC | PRN

## 2013-05-26 MED ORDER — SODIUM CHLORIDE 0.9 % IJ SOLN: 3.0000 mL | Freq: Two times a day (BID) | INTRAMUSCULAR | Status: DC

## 2013-05-26 MED ORDER — DOCUSATE SODIUM 100 MG PO CAPS
100.0000 mg | ORAL_CAPSULE | Freq: Two times a day (BID) | ORAL | Status: DC
  Administered 2013-05-27 – 2013-05-29 (×4): 100 mg via ORAL
  Filled 2013-05-26 (×8): qty 1

## 2013-05-26 NOTE — ED Notes (Signed)
Cardiology at bedside.

## 2013-05-26 NOTE — ED Notes (Signed)
Pt had flu on Christmas EVE, pt had body aches.  Pulse started getting low into the 40s.  Pt sees a holistic MD, they started some medicines to raise energy.  Pt went to acupuncturist today and got bp down and pulse low and sent patient here.

## 2013-05-26 NOTE — Telephone Encounter (Signed)
Patient Information:  Caller Name: [name]  Phone: [phone]  Patient: Theresa Norton  Gender: Female  DOB: June 17, 1928  Age: 80 Years  PCP: Carolann Littler (Family Practice)  Office Follow Up:  Does the office need to follow up with this patient?: No  Instructions For The Office: N/A  RN Note:  Office now closed - will go to [facility] ER.  Symptoms  Reason For Call & Symptoms: Had flu at Christmas, then increased tiredness afterward.  Has noticed BP readings going up, running in the 190s/80s but noticing irregular pulse.  Pulse can miss beats or have couple fo beats together.  Has noticed pulse of 40 some days including this am 1/19.  Has been treating with homeopathic medicines to rid body of lactic acid and uric acid, increase pH in body.  Today 1/19 took her to accupuncturist who managed to get pulse up to 90 but told her to get EKG ands CT scan.  Pulse gradually going down in the hour since her visit, is currently 1.  Becomes lightheaded at times, has to sit down.  Reviewed Health History In EMR: Yes  Reviewed Medications In EMR: Yes  Reviewed Allergies In EMR: Yes  Reviewed Surgeries / Procedures: Yes  Date of Onset of Symptoms: 05/12/2013  Treatments Tried: accupuncture today got pulse up to 90. Given homeopathic meds to decrease lactic and uric acid after flu.  Treatments Tried Worked: No  Guideline(s) Used:  High Blood Pressure  Heart Rate and Heartbeat Questions  Disposition Per Guideline:   See Today in Office  Reason For Disposition Reached:   Age > 60 years  Advice Given:  N/A  Patient Will Follow Care Advice:  YES

## 2013-05-26 NOTE — H&P (Signed)
Theresa Norton is an 80 y.o. female.   Chief Complaint: Weakness and low heart rate HPI:  80 year old female with past medical history of hypertension presents today with complaints of elevated blood pressure, weakness, fatigue, and low heart rate. This seemed to coincide with catching "the flu" around Christmas time. She has been feeling badly since then. She denies any chest pain. She does feel somewhat short of breath and weak. No B-blocker or calcium channel use.   Past Medical History  Diagnosis Date  . Arthritis   . Allergy   . Hypertension   . Colon polyps   . Heart block AV second degree 12/13    after initiating Amlodipine      Past Surgical History  Procedure Laterality Date  . Breast surgery  1959    cyst removal  . Bunionectomy  1967    both feet    Family History  Problem Relation Age of Onset  . Cancer Father     lung   Social History:  reports that she has never smoked. She does not have any smokeless tobacco history on file. She reports that she drinks alcohol. She reports that she does not use illicit drugs.  Allergies:  Allergies  Allergen Reactions  . Codeine     GI upset  . Morphine And Related     GI upset     (Not in a hospital admission)  Results for orders placed during the hospital encounter of 05/26/13 (from the past 48 hour(s))  CBC     Status: None   Collection Time    05/26/13  6:32 PM      Result Value Range   WBC 5.3  4.0 - 10.5 K/uL   RBC 4.19  3.87 - 5.11 MIL/uL   Hemoglobin 14.0  12.0 - 15.0 g/dL   HCT 39.5  36.0 - 46.0 %   MCV 94.3  78.0 - 100.0 fL   MCH 33.4  26.0 - 34.0 pg   MCHC 35.4  30.0 - 36.0 g/dL   RDW 13.5  11.5 - 15.5 %   Platelets 202  150 - 400 K/uL  BASIC METABOLIC PANEL     Status: Abnormal   Collection Time    05/26/13  6:32 PM      Result Value Range   Sodium 136 (*) 137 - 147 mEq/L   Potassium 4.0  3.7 - 5.3 mEq/L   Chloride 98  96 - 112 mEq/L   CO2 21  19 - 32 mEq/L   Glucose, Bld 117 (*) 70 - 99  mg/dL   BUN 13  6 - 23 mg/dL   Creatinine, Ser 0.72  0.50 - 1.10 mg/dL   Calcium 9.2  8.4 - 10.5 mg/dL   GFR calc non Af Amer 78 (*) >90 mL/min   GFR calc Af Amer >90  >90 mL/min   Comment: (NOTE)     The eGFR has been calculated using the CKD EPI equation.     This calculation has not been validated in all clinical situations.     eGFR's persistently <90 mL/min signify possible Chronic Kidney     Disease.  POCT I-STAT TROPONIN I     Status: None   Collection Time    05/26/13  6:50 PM      Result Value Range   Troponin i, poc 0.00  0.00 - 0.08 ng/mL   Comment 3            Comment: Due to  the release kinetics of cTnI,     a negative result within the first hours     of the onset of symptoms does not rule out     myocardial infarction with certainty.     If myocardial infarction is still suspected,     repeat the test at appropriate intervals.   No results found.  ROS Respiratory: Negative for shortness of breath.  Cardiovascular: Negative for chest pain.  All other systems reviewed and are negative.  Blood pressure 222/63, pulse 39, temperature 98.2 F (36.8 C), resp. rate 18, SpO2 100.00%.  Physical Exam  Constitutional: She is oriented to person, place, and time. She appears well-developed and well-nourished. No distress.  HENT: Head: Normocephalic and atraumatic. Conj-pink, Sclera-white. Mouth/Throat: Oropharynx is clear and moist.  Neck: Normal range of motion. Neck supple.  Cardiovascular: S1 and S2 normal. Irregular and slow heart rate. No murmur heard. Pulmonary/Chest: Effort normal and breath sounds normal. No respiratory distress. She has no wheezes.  Abdominal: Soft. Bowel sounds are normal. She exhibits no distension. There is no tenderness.  Musculoskeletal: Normal range of motion. She exhibits no edema.  Neurological: She is alert and oriented to person, place, and time. No cranial nerve deficit. She exhibits normal muscle tone. Coordination normal.  Skin: Skin  is warm and dry. She is not diaphoretic.  Assessment/Plan Complete heart block Hypertension  Admit/Check TSH IV atropine External pacemaker support C. Cath. Permanent pacemaker if not improving.  Shyonna Carlin S 05/26/2013, 8:43 PM

## 2013-05-26 NOTE — ED Notes (Addendum)
Attempted to call report to [facility] RN, was told receiving RN not available and charge nurse unable to take report.

## 2013-05-26 NOTE — ED Provider Notes (Signed)
CSN: [id number]     Arrival date & time 05/26/13  1819 History   First MD Initiated Contact with Patient 05/26/13 1844     Chief Complaint  Patient presents with  . Bradycardia  . Hypertension   (Consider location/radiation/quality/duration/timing/severity/associated sxs/prior Treatment) HPI Comments: Patient is an 80 year old female with past medical history of hypertension. She presents today with complaints of elevated blood pressure, weakness, fatigue, and low heart rate. This seemed to coincide with catching "the flu" around Christmas time. She has been feeling badly since then. She denies any chest pain. She does feel somewhat short of breath and weak.  Patient is a 80 y.o. female presenting with hypertension. The history is provided by the patient.  Hypertension This is a new problem. Episode onset: 3 weeks ago. The problem occurs constantly. The problem has not changed since onset.Pertinent negatives include no chest pain and no shortness of breath. Associated symptoms comments: Weakness. Nothing aggravates the symptoms. Nothing relieves the symptoms. She has tried nothing for the symptoms. The treatment provided no relief.    Past Medical History  Diagnosis Date  . Arthritis   . Allergy   . Hypertension   . Colon polyps   . Heart block AV second degree 12/13    after initiating Amlodipine   Past Surgical History  Procedure Laterality Date  . Breast surgery  1959    cyst removal  . Bunionectomy  1967    both feet   Family History  Problem Relation Age of Onset  . Cancer Father     lung   History  Substance Use Topics  . Smoking status: Never Smoker   . Smokeless tobacco: Not on file  . Alcohol Use: Yes     Comment: daily   OB History   Grav Para Term Preterm Abortions TAB SAB Ect Mult Living                 Review of Systems  Respiratory: Negative for shortness of breath.   Cardiovascular: Negative for chest pain.  All other systems reviewed and are  negative.    Allergies  Codeine and Morphine and related  Home Medications   Current Outpatient Rx  Name  Route  Sig  Dispense  Refill  . BLACK CURRANT SEED OIL PO   Oral   Take 1 tablet by mouth daily.         Marland Kitchen losartan (COZAAR) 100 MG tablet   Oral   Take 1 tablet (100 mg total) by mouth daily.   30 tablet   11    BP 232/66  Pulse 42  Temp(Src) 98.2 F (36.8 C)  Resp 18  SpO2 95% Physical Exam  Vitals reviewed. Constitutional: She is oriented to person, place, and time. She appears well-developed and well-nourished. No distress.  HENT:  Head: Normocephalic and atraumatic.  Mouth/Throat: Oropharynx is clear and moist.  Neck: Normal range of motion. Neck supple.  Cardiovascular:  No murmur heard. Heart is bradycardic.  Pulmonary/Chest: Effort normal and breath sounds normal. No respiratory distress. She has no wheezes.  Abdominal: Soft. Bowel sounds are normal. She exhibits no distension. There is no tenderness.  Musculoskeletal: Normal range of motion. She exhibits no edema.  Neurological: She is alert and oriented to person, place, and time. No cranial nerve deficit. She exhibits normal muscle tone. Coordination normal.  Skin: Skin is warm and dry. She is not diaphoretic.    ED Course  Procedures (including critical care time) Labs Review Labs  Reviewed  CBC  BASIC METABOLIC PANEL  POCT I-STAT TROPONIN I   Imaging Review No results found.  EKG Interpretation    Date/Time:  Monday May 26 2013 18:29:34 EST Ventricular Rate:  43 PR Interval:    QRS Duration: 94 QT Interval:  490 QTC Calculation: 414 R Axis:   5 Text Interpretation:  Third degree heart block Low voltage QRS Incomplete right bundle branch block Cannot rule out Anterior infarct , age undetermined Abnormal ECG Confirmed by DELOS  MD, Matayah Reyburn ([id number]) on 05/26/2013 6:51:36 PM            MDM  No diagnosis found. Patient is an 80 year old female who presents with weakness for  the past several weeks. Her heart rate is been slow at home and she is found to be in a complete heart block here in the emergency department. Her blood pressure is elevated and she appears clinically stable. Laboratory studies are essentially unremarkable and troponin is negative. I've spoken with Dr. Doylene Canard who will see the patient in the emergency department and will likely admit to his service.    Veryl Speak, MD 05/26/13 2044

## 2013-05-27 ENCOUNTER — Encounter (HOSPITAL_COMMUNITY): Payer: Self-pay | Admitting: *Deleted

## 2013-05-27 ENCOUNTER — Encounter (HOSPITAL_COMMUNITY)
Admission: EM | Disposition: A | Discharge status: Home Health | Payer: Self-pay | Source: Home / Self Care | Attending: Cardiovascular Disease

## 2013-05-27 DIAGNOSIS — D500 Iron deficiency anemia secondary to blood loss (chronic): Secondary | ICD-10-CM | POA: Diagnosis not present

## 2013-05-27 DIAGNOSIS — IMO0002 Reserved for concepts with insufficient information to code with codable children: Secondary | ICD-10-CM | POA: Diagnosis not present

## 2013-05-27 DIAGNOSIS — I10 Essential (primary) hypertension: Secondary | ICD-10-CM | POA: Diagnosis not present

## 2013-05-27 DIAGNOSIS — I2510 Atherosclerotic heart disease of native coronary artery without angina pectoris: Secondary | ICD-10-CM | POA: Diagnosis not present

## 2013-05-27 DIAGNOSIS — I498 Other specified cardiac arrhythmias: Secondary | ICD-10-CM | POA: Diagnosis not present

## 2013-05-27 DIAGNOSIS — I442 Atrioventricular block, complete: Secondary | ICD-10-CM

## 2013-05-27 HISTORY — PX: PERMANENT PACEMAKER INSERTION: SHX5480

## 2013-05-27 HISTORY — PX: TEMPORARY PACEMAKER INSERTION: SHX5471

## 2013-05-27 HISTORY — PX: LEFT HEART CATHETERIZATION WITH CORONARY ANGIOGRAM: SHX5451

## 2013-05-27 LAB — CBC
HCT: 36.5 % (ref 36.0–46.0)
Hemoglobin: 12.5 g/dL (ref 12.0–15.0)
MCH: 33.2 pg (ref 26.0–34.0)
MCHC: 34.2 g/dL (ref 30.0–36.0)
MCV: 96.8 fL (ref 78.0–100.0)
Platelets: 160 10*3/uL (ref 150–400)
RBC: 3.77 MIL/uL — ABNORMAL LOW (ref 3.87–5.11)
RDW: 13.5 % (ref 11.5–15.5)
WBC: 8.8 10*3/uL (ref 4.0–10.5)

## 2013-05-27 LAB — CREATININE, SERUM
Creatinine, Ser: 0.72 mg/dL (ref 0.50–1.10)
GFR calc Af Amer: >90 mL/min (ref >90)
GFR calc non Af Amer: 78 mL/min — ABNORMAL LOW (ref >90)

## 2013-05-27 LAB — PROTIME-INR
INR: 0.97 (ref 0.00–1.49)
Prothrombin Time: 12.7 seconds (ref 11.6–15.2)

## 2013-05-27 LAB — MRSA PCR SCREENING: MRSA by PCR: NEGATIVE

## 2013-05-27 LAB — TSH: TSH: 2.941 u[IU]/mL (ref 0.350–4.500)

## 2013-05-27 SURGERY — PERMANENT PACEMAKER INSERTION
Anesthesia: LOCAL

## 2013-05-27 SURGERY — LEFT HEART CATHETERIZATION WITH CORONARY ANGIOGRAM
Anesthesia: LOCAL

## 2013-05-27 MED ORDER — SODIUM CHLORIDE 0.9 % IJ SOLN: 3.0000 mL | INTRAMUSCULAR | Status: DC | PRN

## 2013-05-27 MED ORDER — SODIUM CHLORIDE 0.9 % IV SOLN: 250.0000 mL | INTRAVENOUS | Status: DC | PRN

## 2013-05-27 MED ORDER — HEPARIN (PORCINE) IN NACL 2-0.9 UNIT/ML-% IJ SOLN
INTRAMUSCULAR | Status: AC
  Filled 2013-05-27: qty 1500

## 2013-05-27 MED ORDER — LIDOCAINE HCL (PF) 1 % IJ SOLN
INTRAMUSCULAR | Status: AC
  Filled 2013-05-27: qty 30

## 2013-05-27 MED ORDER — CHLORHEXIDINE GLUCONATE 4 % EX LIQD
60.0000 mL | Freq: Once | CUTANEOUS | Status: AC
  Administered 2013-05-27: 4 via TOPICAL

## 2013-05-27 MED ORDER — SODIUM CHLORIDE 0.9 % IR SOLN: 80.0000 mg | Status: DC

## 2013-05-27 MED ORDER — SODIUM CHLORIDE 0.9 % IV SOLN: INTRAVENOUS | Status: DC

## 2013-05-27 MED ORDER — SODIUM CHLORIDE 0.9 % IV SOLN
INTRAVENOUS | Status: DC
  Administered 2013-05-27: 13:00:00 via INTRAVENOUS

## 2013-05-27 MED ORDER — MIDAZOLAM HCL 5 MG/5ML IJ SOLN
INTRAMUSCULAR | Status: AC
  Filled 2013-05-27: qty 5

## 2013-05-27 MED ORDER — NITROGLYCERIN 0.2 MG/ML ON CALL CATH LAB
INTRAVENOUS | Status: AC
  Filled 2013-05-27: qty 1

## 2013-05-27 MED ORDER — CHLORHEXIDINE GLUCONATE 4 % EX LIQD: 60.0000 mL | Freq: Once | CUTANEOUS | Status: DC

## 2013-05-27 MED ORDER — ASPIRIN 81 MG PO CHEW
81.0000 mg | CHEWABLE_TABLET | ORAL | Status: AC
  Administered 2013-05-27: 81 mg via ORAL
  Filled 2013-05-27: qty 1

## 2013-05-27 MED ORDER — DIPHENHYDRAMINE HCL 25 MG PO CAPS
25.0000 mg | ORAL_CAPSULE | Freq: Four times a day (QID) | ORAL | Status: DC | PRN
  Administered 2013-05-27: 25 mg via ORAL
  Filled 2013-05-27: qty 1

## 2013-05-27 MED ORDER — HEPARIN (PORCINE) IN NACL 2-0.9 UNIT/ML-% IJ SOLN
INTRAMUSCULAR | Status: AC
  Filled 2013-05-27: qty 500

## 2013-05-27 MED ORDER — CEFAZOLIN SODIUM-DEXTROSE 2-3 GM-% IV SOLR: 2.0000 g | INTRAVENOUS | Status: DC

## 2013-05-27 MED ORDER — SODIUM CHLORIDE 0.9 % IJ SOLN: 3.0000 mL | Freq: Two times a day (BID) | INTRAMUSCULAR | Status: DC

## 2013-05-27 MED ORDER — MIDAZOLAM HCL 2 MG/2ML IJ SOLN
INTRAMUSCULAR | Status: AC
  Filled 2013-05-27: qty 2

## 2013-05-27 MED ORDER — PHENYLEPHRINE HCL 10 MG/ML IJ SOLN
INTRAMUSCULAR | Status: AC
  Filled 2013-05-27: qty 1

## 2013-05-27 MED ORDER — ONDANSETRON HCL 4 MG/2ML IJ SOLN: 4.0000 mg | Freq: Four times a day (QID) | INTRAMUSCULAR | Status: DC | PRN

## 2013-05-27 MED ORDER — ONDANSETRON HCL 4 MG/2ML IJ SOLN
INTRAMUSCULAR | Status: AC
  Filled 2013-05-27: qty 2

## 2013-05-27 MED ORDER — ALPRAZOLAM 0.25 MG PO TABS
0.2500 mg | ORAL_TABLET | Freq: Two times a day (BID) | ORAL | Status: DC
  Administered 2013-05-27 – 2013-05-28 (×3): 0.25 mg via ORAL
  Filled 2013-05-27 (×4): qty 1

## 2013-05-27 MED ORDER — CHLORHEXIDINE GLUCONATE 4 % EX LIQD
60.0000 mL | Freq: Once | CUTANEOUS | Status: DC
  Filled 2013-05-27: qty 15

## 2013-05-27 MED ORDER — SODIUM CHLORIDE 0.9 % IV SOLN
INTRAVENOUS | Status: DC
  Administered 2013-05-27: 10:00:00 via INTRAVENOUS

## 2013-05-27 MED ORDER — SODIUM CHLORIDE 0.9 % IR SOLN
80.0000 mg | Status: DC
  Filled 2013-05-27: qty 2

## 2013-05-27 MED ORDER — FENTANYL CITRATE 0.05 MG/ML IJ SOLN
INTRAMUSCULAR | Status: AC
  Filled 2013-05-27: qty 2

## 2013-05-27 MED ORDER — SODIUM CHLORIDE 0.9 % IJ SOLN
3.0000 mL | Freq: Two times a day (BID) | INTRAMUSCULAR | Status: DC
  Administered 2013-05-27 – 2013-05-28 (×2): 3 mL via INTRAVENOUS

## 2013-05-27 MED ORDER — CEFAZOLIN SODIUM-DEXTROSE 2-3 GM-% IV SOLR
2.0000 g | INTRAVENOUS | Status: AC
  Administered 2013-05-27: 2 g via INTRAVENOUS
  Filled 2013-05-27: qty 50

## 2013-05-27 MED ORDER — ACETAMINOPHEN 325 MG PO TABS
325.0000 mg | ORAL_TABLET | ORAL | Status: DC | PRN
  Administered 2013-05-29 (×2): 650 mg via ORAL
  Filled 2013-05-27 (×2): qty 2

## 2013-05-27 MED ORDER — CEFAZOLIN SODIUM 1-5 GM-% IV SOLN
1.0000 g | Freq: Four times a day (QID) | INTRAVENOUS | Status: AC
  Administered 2013-05-27 – 2013-05-28 (×3): 1 g via INTRAVENOUS
  Filled 2013-05-27 (×3): qty 50

## 2013-05-27 NOTE — CV Procedure (Signed)
PROCEDURE:  Left heart catheterization with selective coronary angiography, left ventriculogram.  CLINICAL HISTORY:  This is a 80 year old female with complete heart block and dyspnea on exertion.  The risks, benefits, and details of the procedure were explained to the patient.  The patient verbalized understanding and wanted to proceed.  Informed written consent was obtained.  PROCEDURE TECHNIQUE:  The patient was approached from the right femoral artery using a 5 French short sheath.  Left coronary angiography was done using a Judkins L4 guide catheter.  Right coronary angiography was done using a Judkins R4 guide catheter.  Left ventriculography was not done.    CONTRAST:  Total of 30 cc.  COMPLICATIONS:  None.  At the end of the procedure a manual device was used for hemostasis.    HEMODYNAMICS:  Aortic pressure was 206/69; LV pressure was 213/0; LVEDP 0.  There was no gradient between the left ventricle and aorta.    ANGIOGRAM/CORONARY ARTERIOGRAM:   The left main coronary artery is unremarkable.  The left anterior descending artery luminal irregularities. Diagonal one supplies OM/LCX territories.   The left circumflex artery has minimal luminal irregularities.   The right coronary artery is dominant and has luminal irregularities only. Postero-lateral and PDA are unremarkable.  LEFT VENTRICULOGRAM:  Left ventricular angiogram was not done with an estimated ejection fraction of 45 %.  LVEDP was 0 mmHg.  IMPRESSION OF HEART CATHETERIZATION:   1. Normal left main coronary artery. 2. Mild disease of left anterior descending artery and its branches. 3. Minimal disease of left circumflex artery and its branches. 4. Minimal disease of right coronary artery. 5. Mild to moderate left ventricular systolic dysfunction.  LVEDP 0 mmHg.  Ejection fraction 45 %.  RECOMMENDATION:   Permanent pacemaker today.

## 2013-05-27 NOTE — Interval H&P Note (Signed)
History and Physical Interval Note:  05/27/2013 10:25 AM  Theresa Norton  has presented today for surgery, with the diagnosis of chb  The various methods of treatment have been discussed with the patient and family. After consideration of risks, benefits and other options for treatment, the patient has consented to  Procedure(s): LEFT HEART CATHETERIZATION WITH CORONARY ANGIOGRAM (N/A) TEMPORARY PACEMAKER INSERTION (N/A) as a surgical intervention .  The patient's history has been reviewed, patient examined, no change in status, stable for surgery.  I have reviewed the patient's chart and labs.  Questions were answered to the patient's satisfaction.     Dinia Joynt S

## 2013-05-27 NOTE — H&P (View-Only) (Signed)
 ELECTROPHYSIOLOGY CONSULT NOTE    Patient ID: Theresa Norton MRN: [id number], DOB/AGE: 08/10/1928 80 y.o.  Admit date: 05/26/2013 Date of Consult: 05-27-2013  Primary Physician: BURCHETTE,BRUCE W, MD Primary Cardiologist: Kadakia  Reason for Consultation: heart block  HPI:  Theresa Norton is a 80 y.o. female with a past medical history significant for arthritis and hypertension.  She has had no previous cardiac follow-up.  On Christmas Day she developed acute fatigue and weakness. She thought that she had the flu but had no fever or other symptoms.  She was seen by her homeopathic physician and then by her accupuncturist.  When she was having accupuncture she was noted to be bradycardic and advised to follow up with her PCP who advised her to be evaluated at [place].  On arrival she was found to be in high grade heart block with ventricular rates in the 30's.  She was admitted for further evaluation.  Plan is for cardiac catheterization today to evaluate for ischemic cause of heart block.    Losartan is her only home prescription medication and she has been on this for about 1 year.  She is on no other AV nodal blocking agents.  She has never had a cardiac work up in the past.  Prior to Christmas Day she had no functional limitations, chest pain, shortness of breath, lower extremity edema, or palpitations.    Lab work is unremarkable this admission. Echo is pending  EP has been asked to evaluate for treatment options.  ROS is negative except as outlined above.   Past Medical History  Diagnosis Date  . Arthritis   . Allergy   . Hypertension   . Colon polyps   . Heart block AV second degree 12/13    after initiating Amlodipine     Surgical History:  Past Surgical History  Procedure Laterality Date  . Breast surgery  1959    cyst removal  . Bunionectomy  1967    both feet     Prescriptions prior to admission  Medication Sig Dispense Refill  . BLACK CURRANT SEED OIL  PO Take 1 tablet by mouth daily.      . losartan (COZAAR) 100 MG tablet Take 1 tablet (100 mg total) by mouth daily.  30 tablet  11    Inpatient Medications:  . aspirin  81 mg Oral Pre-Cath  . aspirin EC  81 mg Oral Daily  . docusate sodium  100 mg Oral BID  . heparin  5,000 Units Subcutaneous Q8H  . multivitamin with minerals  1 tablet Oral Daily  . sodium chloride  3 mL Intravenous Q12H  . sodium chloride  3 mL Intravenous Q12H  . sodium chloride  3 mL Intravenous Q12H    Allergies:  Allergies  Allergen Reactions  . Codeine     GI upset  . Morphine And Related     GI upset    History   Social History  . Marital Status: Married    Spouse Name: N/A    Number of Children: N/A  . Years of Education: N/A   Occupational History  . Not on file.   Social History Main Topics  . Smoking status: Never Smoker   . Smokeless tobacco: Not on file  . Alcohol Use: Yes     Comment: daily  . Drug Use: No  . Sexual Activity: Not on file   Other Topics Concern  . Not on file   Social History Narrative  .   No narrative on file     Family History  Problem Relation Age of Onset  . Cancer Father     lung   Physical Exam: Filed Vitals:   05/27/13 1300 05/27/13 1315 05/27/13 1330 05/27/13 1345  BP: 234/59 166/90 227/63 224/62  Pulse: 38 37 41 37  Temp: 97.8 F (36.6 C)     TempSrc: Oral     Resp: 20 15 19 17   Height: 5\' 3"  (1.6 m)     Weight: 160 lb 4.4 oz (72.7 kg)     SpO2: 99% 100% 100% 99%    GEN- The patient is pleasant appearing, alert and oriented x 3 today.   Head- normocephalic, atraumatic Eyes-  Sclera clear, conjunctiva pink Ears- hearing intact Oropharynx- clear Neck- supple,  Lungs- Clear to ausculation bilaterally, normal work of breathing Heart- bradycardic rhythm GI- soft, NT, ND, + BS Extremities- no clubbing, cyanosis, or edema, moderate R groin hematoma MS- no significant deformity or atrophy Skin- no rash or lesion Psych- euthymic mood, full  affect Neuro- strength and sensation are intact  Labs:   Lab Results  Component Value Date   WBC 4.6 05/26/2013   HGB 13.2 05/26/2013   HCT 37.5 05/26/2013   MCV 95.4 05/26/2013   PLT 197 05/26/2013    Recent Labs Lab 05/26/13 1832 05/26/13 2249  NA 136*  --   K 4.0  --   CL 98  --   CO2 21  --   BUN 13  --   CREATININE 0.72 0.72  CALCIUM 9.2  --   GLUCOSE 117*  --     Radiology/Studies: No results found.  EKG: complete heart block, ventricular rate 43, QRS 94   TELEMETRY: high grade heart block, ventricular rates 30's  A/P The patient has symptomatic complete heart block.  No reversible causes have been found.  I would therefore recommend pacemaker implantation at this time.  Risks, benefits, alternatives to pacemaker implantation were discussed in detail with the patient today. The patient understands that the risks include but are not limited to bleeding, infection, pneumothorax, perforation, tamponade, vascular damage, renal failure, MI, stroke, death,  and lead dislodgement and wishes to proceed. We will therefore schedule the procedure at the next available time.

## 2013-05-27 NOTE — Op Note (Signed)
SURGEON:  Thompson Grayer, MD     PREPROCEDURE DIAGNOSIS:  Symptomatic complete heart block    POSTPROCEDURE DIAGNOSIS:  Symptomatic complete heart block     PROCEDURES:   1. Left upper extremity venography.   2. Pacemaker implantation.     INTRODUCTION: Theresa Norton is a 80 y.o. female  with a history of complete heart block who presents today for pacemaker implantation.  The patient reports progressive episodes of dizziness and fatigue over the past few days.  She is found to have complete heart block.  No reversible causes have been identified.  The patient therefore presents today for pacemaker implantation.     DESCRIPTION OF PROCEDURE:  Informed written consent was obtained, and the patient was brought to the electrophysiology lab in a fasting state.  The patient received 1mg  IV Versed as sedation for the procedure today.  The patients left chest was prepped and draped in the usual sterile fashion by the EP lab staff. The skin overlying the left deltopectoral region was infiltrated with lidocaine for local analgesia.  A 4-cm incision was made over the left deltopectoral region.  A left subcutaneous pacemaker pocket was fashioned using a combination of sharp and blunt dissection. Electrocautery was required to assure hemostasis.    RA/RV Lead Placement: The left axillary vein was cannulated. No contrast was required.   Through the left axillary vein, a Medtronic model [id number] (serial number [id number]) right atrial lead and a Medtronic model 3810- [id number] right ventricular lead were advanced with fluoroscopic visualization into the right atrial appendage and right ventricular apex positions respectively.  Though the V lead initially had good capture and sensing, it dislodged and was felt to be too unstable for longterm use.  I opted to place a activation lead.  The passive lead was therefore removed.  A venogram of the left upper extremity was performed, which revealed a large left cephalic vein,  which emptied into a large left subclavian vein.  The left axillary vein was moderate in size.  The axillary vein was again cannulated.  Through the left axillary vein, a Medtronic model [phone] (serial number [id number]) right ventricular lead was advanced with fluoroscopic visualization into the right ventricular apex position.   Initial atrial lead P- waves measured 3 mV with impedance of 781 ohms and a threshold of 0.9 V at 0.5 msec.  Right ventricular lead R-waves measured 7 mV with an impedance of 1093 ohms and a threshold of 0.3 V at 0.5 msec.  Large injury currents were observed for both leads.  Both leads were secured to the pectoralis fascia using #2-0 silk over the suture sleeves.   Device Placement:  The leads were then connected to a Medtronic Adapta L model ADDRL 1 (serial number [id number]) pacemaker.  The pocket was irrigated with copious gentamicin solution.  The pacemaker was then placed into the pocket.  The pocket was then closed in 2 layers with 2.0 Vicryl suture for the subcutaneous and subcuticular layers.  Steri- Strips and a sterile dressing were then applied.  There were no early apparent complications.     CONCLUSIONS:   1. Successful implantation of a Medtronic Adapta L dual-chamber pacemaker for symptomatic complete heart block  2. No early apparent complications.           Thompson Grayer, MD 05/27/2013 6:49 PM

## 2013-05-27 NOTE — Progress Notes (Signed)
  Echocardiogram 2D Echocardiogram has been performed.  Donata Clay 05/27/2013, 3:36 PM

## 2013-05-27 NOTE — Progress Notes (Signed)
A large hematoma was found at the right groin cath site. Pressure applied to site, called Camera operator for assistance.  Explained what had transpired to the patient.

## 2013-05-27 NOTE — Progress Notes (Signed)
Patient transported to the EP lab for pacemaker placement.  Right groin shows no signs of further bleeding.  Right groin is soft but bruised. Same reported to EP lab staff.

## 2013-05-27 NOTE — Progress Notes (Signed)
Pressure had been held since 1345 without hemo stasis being achieved. Right groin is aparantly stable at this time.  Pressure is released. There is a large hematoma in her right groin. Her right groin is soft without any nodules. Will continue to monitor closely.

## 2013-05-27 NOTE — Interval H&P Note (Signed)
History and Physical Interval Note:  05/27/2013 2:36 PM  Theresa Norton  has presented today for surgery, with the diagnosis of bradicardia  The various methods of treatment have been discussed with the patient and family. After consideration of risks, benefits and other options for treatment, the patient has consented to  Procedure(s): PERMANENT PACEMAKER INSERTION (N/A) as a surgical intervention .  The patient's history has been reviewed, patient examined, no change in status, stable for surgery.  I have reviewed the patient's chart and labs.  Questions were answered to the patient's satisfaction.     Thompson Grayer

## 2013-05-27 NOTE — Consult Note (Signed)
ELECTROPHYSIOLOGY CONSULT NOTE    Patient ID: Theresa Norton MRN: [id number], DOB/AGE: 13-Jan-1929 80 y.o.  Admit date: 05/26/2013 Date of Consult: 05-27-2013  Primary Physician: Eulas Post, MD Primary Cardiologist: Doylene Canard  Reason for Consultation: heart block  HPI:  Theresa Norton is a 80 y.o. female with a past medical history significant for arthritis and hypertension.  She has had no previous cardiac follow-up.  On Christmas Day she developed acute fatigue and weakness. She thought that she had the flu but had no fever or other symptoms.  She was seen by her homeopathic physician and then by her accupuncturist.  When she was having accupuncture she was noted to be bradycardic and advised to follow up with her PCP who advised her to be evaluated at [facility] ER.  On arrival she was found to be in high grade heart block with ventricular rates in the 30's.  She was admitted for further evaluation.  Plan is for cardiac catheterization today to evaluate for ischemic cause of heart block.    Losartan is her only home prescription medication and she has been on this for about 1 year.  She is on no other AV nodal blocking agents.  She has never had a cardiac work up in the past.  Prior to Christmas Day she had no functional limitations, chest pain, shortness of breath, lower extremity edema, or palpitations.    Lab work is unremarkable this admission. Echo is pending  EP has been asked to evaluate for treatment options.  ROS is negative except as outlined above.   Past Medical History  Diagnosis Date  . Arthritis   . Allergy   . Hypertension   . Colon polyps   . Heart block AV second degree 12/13    after initiating Amlodipine     Surgical History:  Past Surgical History  Procedure Laterality Date  . Breast surgery  1959    cyst removal  . Bunionectomy  1967    both feet     Prescriptions prior to admission  Medication Sig Dispense Refill  . BLACK CURRANT SEED OIL  PO Take 1 tablet by mouth daily.      Marland Kitchen losartan (COZAAR) 100 MG tablet Take 1 tablet (100 mg total) by mouth daily.  30 tablet  11    Inpatient Medications:  . aspirin  81 mg Oral Pre-Cath  . aspirin EC  81 mg Oral Daily  . docusate sodium  100 mg Oral BID  . heparin  5,000 Units Subcutaneous Q8H  . multivitamin with minerals  1 tablet Oral Daily  . sodium chloride  3 mL Intravenous Q12H  . sodium chloride  3 mL Intravenous Q12H  . sodium chloride  3 mL Intravenous Q12H    Allergies:  Allergies  Allergen Reactions  . Codeine     GI upset  . Morphine And Related     GI upset    History   Social History  . Marital Status: Married    Spouse Name: N/A    Number of Children: N/A  . Years of Education: N/A   Occupational History  . Not on file.   Social History Main Topics  . Smoking status: Never Smoker   . Smokeless tobacco: Not on file  . Alcohol Use: Yes     Comment: daily  . Drug Use: No  . Sexual Activity: Not on file   Other Topics Concern  . Not on file   Social History Narrative  .  No narrative on file     Family History  Problem Relation Age of Onset  . Cancer Father     lung   Physical Exam: Filed Vitals:   05/27/13 1300 05/27/13 1315 05/27/13 1330 05/27/13 1345  BP: 234/59 166/90 227/63 224/62  Pulse: 38 37 41 37  Temp: 97.8 F (36.6 C)     TempSrc: Oral     Resp: 20 15 19 17   Height: 5\' 3"  (1.6 m)     Weight: 160 lb 4.4 oz (72.7 kg)     SpO2: 99% 100% 100% 99%    GEN- The patient is pleasant appearing, alert and oriented x 3 today.   Head- normocephalic, atraumatic Eyes-  Sclera clear, conjunctiva pink Ears- hearing intact Oropharynx- clear Neck- supple,  Lungs- Clear to ausculation bilaterally, normal work of breathing Heart- bradycardic rhythm GI- soft, NT, ND, + BS Extremities- no clubbing, cyanosis, or edema, moderate R groin hematoma MS- no significant deformity or atrophy Skin- no rash or lesion Psych- euthymic mood, full  affect Neuro- strength and sensation are intact  Labs:   Lab Results  Component Value Date   WBC 4.6 05/26/2013   HGB 13.2 05/26/2013   HCT 37.5 05/26/2013   MCV 95.4 05/26/2013   PLT 197 05/26/2013    Recent Labs Lab 05/26/13 1832 05/26/13 2249  NA 136*  --   K 4.0  --   CL 98  --   CO2 21  --   BUN 13  --   CREATININE 0.72 0.72  CALCIUM 9.2  --   GLUCOSE 117*  --     Radiology/Studies: No results found.  EKG: complete heart block, ventricular rate 43, QRS 94   TELEMETRY: high grade heart block, ventricular rates 30's  A/P The patient has symptomatic complete heart block.  No reversible causes have been found.  I would therefore recommend pacemaker implantation at this time.  Risks, benefits, alternatives to pacemaker implantation were discussed in detail with the patient today. The patient understands that the risks include but are not limited to bleeding, infection, pneumothorax, perforation, tamponade, vascular damage, renal failure, MI, stroke, death,  and lead dislodgement and wishes to proceed. We will therefore schedule the procedure at the next available time.

## 2013-05-28 ENCOUNTER — Inpatient Hospital Stay (HOSPITAL_COMMUNITY): Payer: Medicare Other

## 2013-05-28 DIAGNOSIS — F411 Generalized anxiety disorder: Secondary | ICD-10-CM | POA: Diagnosis not present

## 2013-05-28 DIAGNOSIS — D500 Iron deficiency anemia secondary to blood loss (chronic): Secondary | ICD-10-CM | POA: Diagnosis not present

## 2013-05-28 DIAGNOSIS — D509 Iron deficiency anemia, unspecified: Secondary | ICD-10-CM | POA: Diagnosis not present

## 2013-05-28 DIAGNOSIS — IMO0002 Reserved for concepts with insufficient information to code with codable children: Secondary | ICD-10-CM | POA: Diagnosis not present

## 2013-05-28 DIAGNOSIS — I498 Other specified cardiac arrhythmias: Secondary | ICD-10-CM | POA: Diagnosis not present

## 2013-05-28 DIAGNOSIS — I442 Atrioventricular block, complete: Secondary | ICD-10-CM | POA: Diagnosis not present

## 2013-05-28 DIAGNOSIS — I517 Cardiomegaly: Secondary | ICD-10-CM | POA: Diagnosis not present

## 2013-05-28 DIAGNOSIS — I2510 Atherosclerotic heart disease of native coronary artery without angina pectoris: Secondary | ICD-10-CM | POA: Diagnosis not present

## 2013-05-28 DIAGNOSIS — Z950 Presence of cardiac pacemaker: Secondary | ICD-10-CM | POA: Diagnosis not present

## 2013-05-28 LAB — BASIC METABOLIC PANEL
BUN: 13 mg/dL (ref 6–23)
CO2: 21 mEq/L (ref 19–32)
Calcium: 8 mg/dL — ABNORMAL LOW (ref 8.4–10.5)
Chloride: 103 mEq/L (ref 96–112)
Creatinine, Ser: 0.76 mg/dL (ref 0.50–1.10)
GFR calc Af Amer: 89 mL/min — ABNORMAL LOW (ref >90)
GFR calc non Af Amer: 76 mL/min — ABNORMAL LOW (ref >90)
Glucose, Bld: 96 mg/dL (ref 70–99)
Potassium: 4.1 mEq/L (ref 3.7–5.3)
Sodium: 138 mEq/L (ref 137–147)

## 2013-05-28 LAB — CBC
HCT: 29.3 % — ABNORMAL LOW (ref 36.0–46.0)
Hemoglobin: 10.1 g/dL — ABNORMAL LOW (ref 12.0–15.0)
MCH: 33.3 pg (ref 26.0–34.0)
MCHC: 34.5 g/dL (ref 30.0–36.0)
MCV: 96.7 fL (ref 78.0–100.0)
Platelets: 145 10*3/uL — ABNORMAL LOW (ref 150–400)
RBC: 3.03 MIL/uL — ABNORMAL LOW (ref 3.87–5.11)
RDW: 13.4 % (ref 11.5–15.5)
WBC: 5.2 10*3/uL (ref 4.0–10.5)

## 2013-05-28 MED ORDER — LORAZEPAM 2 MG/ML IJ SOLN
INTRAMUSCULAR | Status: AC
  Filled 2013-05-28: qty 1

## 2013-05-28 MED ORDER — LORAZEPAM 2 MG/ML IJ SOLN
0.5000 mg | Freq: Once | INTRAMUSCULAR | Status: AC
  Administered 2013-05-28: 0.5 mg via INTRAVENOUS

## 2013-05-28 MED ORDER — LOSARTAN POTASSIUM 50 MG PO TABS
100.0000 mg | ORAL_TABLET | Freq: Every day | ORAL | Status: DC
  Administered 2013-05-28 – 2013-05-29 (×2): 100 mg via ORAL
  Filled 2013-05-28 (×3): qty 2

## 2013-05-28 NOTE — Progress Notes (Signed)
Orthopedic Tech Progress Note Patient Details:  Theresa Norton [name] 08/02/1928 [id number]  Ortho Devices Type of Ortho Device: Arm sling Ortho Device/Splint Location: LUE Ortho Device/Splint Interventions: Ordered;Application   Braulio Bosch 05/28/2013, 11:01 PM

## 2013-05-28 NOTE — Discharge Instructions (Signed)
° °  Supplemental Discharge Instructions for  Pacemaker/Defibrillator Patients  Activity No heavy lifting or vigorous activity with your left/right arm for 6 to 8 weeks.  Do not raise your left/right arm above your head for one week.  Gradually raise your affected arm as drawn below.           01/23                      01/24                       01/25                      01/26       NO DRIVING for 1 week; you may begin driving on 25/09/3974. WOUND CARE   Keep the wound area clean and dry.  Do not get this area wet for one week. No showers for one week; you may shower on 06/05/2013.   The tape/steri-strips on your wound will fall off; do not pull them off.  No bandage is needed on the site.  DO  NOT apply any creams, oils, or ointments to the wound area.   If you notice any drainage or discharge from the wound, any swelling or bruising at the site, or you develop a fever > 101? F after you are discharged home, call the office at once.  Special Instructions   You are still able to use cellular telephones; use the ear opposite the side where you have your pacemaker/defibrillator.  Avoid carrying your cellular phone near your device.   When traveling through airports, show security personnel your identification card to avoid being screened in the metal detectors.  Ask the security personnel to use the hand wand.   Avoid arc welding equipment, MRI testing (magnetic resonance imaging), TENS units (transcutaneous nerve stimulators).  Call the office for questions about other devices.   Avoid electrical appliances that are in poor condition or are not properly grounded.   Microwave ovens are safe to be near or to operate.

## 2013-05-28 NOTE — Progress Notes (Signed)
Subjective:  Feeling better. Some right groin hematoma. Confusion last night is improving.  Objective:  Vital Signs in the last 24 hours: Temp:  [98 F (36.7 C)-98.9 F (37.2 C)] 98.5 F (36.9 C) (01/21 1200) Pulse Rate:  [36-95] 85 (01/21 1400) Cardiac Rhythm:  [-] Heart block (01/21 0800) Resp:  [15-25] 24 (01/21 1400) BP: (122-205)/(47-118) 148/66 mmHg (01/21 1400) SpO2:  [52 %-100 %] 99 % (01/21 1400)  Physical Exam: BP Readings from Last 1 Encounters:  05/28/13 148/66     Wt Readings from Last 1 Encounters:  05/27/13 72.7 kg (160 lb 4.4 oz)    Weight change: -0.5 kg (-1 lb 1.6 oz)  HEENT: [place]/AT, Eyes-Hazel, PERL, EOMI, Conjunctiva-Pink, Sclera-Non-icteric Neck: No JVD, No bruit, Trachea midline. Lungs:  Clear, Bilateral. Left pectoral pacer pocket-stable. Cardiac:  Regular rhythm, normal S1 and S2, no S3. II/VI systolic murmur. Abdomen:  Soft, non-tender. Extremities:  No edema present. No cyanosis. No clubbing. Large right groin ecchymosis.  CNS: AxOx3, Cranial nerves grossly intact, moves all 4 extremities. Right handed. Skin: Warm and dry.   Intake/Output from previous day: 01/20 0701 - 01/21 0700 In: 210 [P.O.:60; IV Piggyback:150] Out: -     Lab Results: BMET    Component Value Date/Time   NA 138 05/28/2013 0750   K 4.1 05/28/2013 0750   CL 103 05/28/2013 0750   CO2 21 05/28/2013 0750   GLUCOSE 96 05/28/2013 0750   BUN 13 05/28/2013 0750   CREATININE 0.76 05/28/2013 0750   CALCIUM 8.0* 05/28/2013 0750   GFRNONAA 76* 05/28/2013 0750   GFRAA 89* 05/28/2013 0750   CBC    Component Value Date/Time   WBC 5.2 05/28/2013 0750   RBC 3.03* 05/28/2013 0750   HGB 10.1* 05/28/2013 0750   HCT 29.3* 05/28/2013 0750   PLT 145* 05/28/2013 0750   MCV 96.7 05/28/2013 0750   MCH 33.3 05/28/2013 0750   MCHC 34.5 05/28/2013 0750   RDW 13.4 05/28/2013 0750   LYMPHSABS 1.5 10/10/2012 1631   MONOABS 0.5 10/10/2012 1631   EOSABS 0.1 10/10/2012 1631   BASOSABS 0.1 10/10/2012 1631    CARDIAC ENZYMES No results found for this basename: CKTOTAL, CKMB, CKMBINDEX, TROPONINI    Scheduled Meds: . ALPRAZolam  0.25 mg Oral BID  . aspirin EC  81 mg Oral Daily  . docusate sodium  100 mg Oral BID  . losartan  100 mg Oral Daily  . multivitamin with minerals  1 tablet Oral Daily   Continuous Infusions:  PRN Meds:.acetaminophen, diphenhydrAMINE, ondansetron (ZOFRAN) IV  Assessment/Plan: Complete heart block  Hypertension Anemia of blood loss  Check iron level. Increase activity gradually. Resume Losartan.   LOS: 2 days    Dixie Dials  MD  05/28/2013, 2:35 PM

## 2013-05-28 NOTE — Progress Notes (Addendum)
   SUBJECTIVE: Confused overnight,  Sleeping this am.  At this time, she denies chest pain, shortness of breath, or any new concerns.  . ALPRAZolam  0.25 mg Oral BID  . aspirin EC  81 mg Oral Daily  .  ceFAZolin (ANCEF) IV  1 g Intravenous Q6H  . docusate sodium  100 mg Oral BID  . multivitamin with minerals  1 tablet Oral Daily  . sodium chloride  3 mL Intravenous Q12H      OBJECTIVE: Physical Exam: Filed Vitals:   05/28/13 0000 05/28/13 0100 05/28/13 0500 05/28/13 0600  BP: 157/78 168/67 170/72 175/66  Pulse: 89 88 95 84  Temp: 98 F (36.7 C)     TempSrc: Oral     Resp: 21 21 18 22   Height:      Weight:      SpO2: 95% 98% 52% 100%    Intake/Output Summary (Last 24 hours) at 05/28/13 0756 Last data filed at 05/28/13 0204  Gross per 24 hour  Intake    210 ml  Output      0 ml  Net    210 ml    Telemetry reveals sinus rhythm with V pacing  GEN- The patient is sleeping but rouses Head- normocephalic, atraumatic Eyes-  Sclera clear, conjunctiva pink Ears- hearing intact Oropharynx- clear Neck- supple,  Lungs- Clear to ausculation bilaterally, normal work of breathing Heart- Regular rate and rhythm  GI- soft, NT, ND, + BS Extremities- no clubbing, cyanosis, or edema Skin- R groin with moderate ecchymosis  LABS: Basic Metabolic Panel:  Recent Labs  05/26/13 1832 05/26/13 2249  NA 136*  --   K 4.0  --   CL 98  --   CO2 21  --   GLUCOSE 117*  --   BUN 13  --   CREATININE 0.72 0.72  CALCIUM 9.2  --    Liver Function Tests: No results found for this basename: AST, ALT, ALKPHOS, BILITOT, PROT, ALBUMIN,  in the last 72 hours No results found for this basename: LIPASE, AMYLASE,  in the last 72 hours CBC:  Recent Labs  05/26/13 2249 05/27/13 1940  WBC 4.6 8.8  HGB 13.2 12.5  HCT 37.5 36.5  MCV 95.4 96.8  PLT 197 160  RADIOLOGY: CXR is pending  ASSESSMENT AND PLAN:  Active Problems:   Hypertension   Complete heart block  1. Complete heart  block Stable s/p PPM Interrogation is reviewed and normal Awaiting chest xray Would keep sling/ pressure dressing in place for another day  2. HTN Dr Doylene Canard to gradually add back antihypertensives  3. R groin hematoma CBC is stable Consider up with assistance/ ambulation with PT later today  If she remains stable then she could transfer to telemetry later today.  If needed, another day of stepdown would also be reasonable.    Thompson Grayer, MD 05/28/2013 7:56 AM   Addendum CXR is reviewed No ptx, stable leads

## 2013-05-29 DIAGNOSIS — IMO0002 Reserved for concepts with insufficient information to code with codable children: Secondary | ICD-10-CM | POA: Diagnosis not present

## 2013-05-29 DIAGNOSIS — Z950 Presence of cardiac pacemaker: Secondary | ICD-10-CM | POA: Diagnosis not present

## 2013-05-29 DIAGNOSIS — I2510 Atherosclerotic heart disease of native coronary artery without angina pectoris: Secondary | ICD-10-CM | POA: Diagnosis not present

## 2013-05-29 DIAGNOSIS — I442 Atrioventricular block, complete: Secondary | ICD-10-CM | POA: Diagnosis not present

## 2013-05-29 DIAGNOSIS — I498 Other specified cardiac arrhythmias: Secondary | ICD-10-CM | POA: Diagnosis not present

## 2013-05-29 DIAGNOSIS — D509 Iron deficiency anemia, unspecified: Secondary | ICD-10-CM | POA: Diagnosis not present

## 2013-05-29 DIAGNOSIS — F411 Generalized anxiety disorder: Secondary | ICD-10-CM | POA: Diagnosis not present

## 2013-05-29 DIAGNOSIS — D500 Iron deficiency anemia secondary to blood loss (chronic): Secondary | ICD-10-CM | POA: Diagnosis not present

## 2013-05-29 LAB — CBC
HCT: 29.4 % — ABNORMAL LOW (ref 36.0–46.0)
Hemoglobin: 9.9 g/dL — ABNORMAL LOW (ref 12.0–15.0)
MCH: 32.8 pg (ref 26.0–34.0)
MCHC: 33.7 g/dL (ref 30.0–36.0)
MCV: 97.4 fL (ref 78.0–100.0)
Platelets: 133 10*3/uL — ABNORMAL LOW (ref 150–400)
RBC: 3.02 MIL/uL — ABNORMAL LOW (ref 3.87–5.11)
RDW: 13.5 % (ref 11.5–15.5)
WBC: 5 10*3/uL (ref 4.0–10.5)

## 2013-05-29 LAB — BASIC METABOLIC PANEL
BUN: 14 mg/dL (ref 6–23)
CO2: 21 mEq/L (ref 19–32)
Calcium: 8.2 mg/dL — ABNORMAL LOW (ref 8.4–10.5)
Chloride: 101 mEq/L (ref 96–112)
Creatinine, Ser: 0.68 mg/dL (ref 0.50–1.10)
GFR calc Af Amer: >90 mL/min (ref >90)
GFR calc non Af Amer: 79 mL/min — ABNORMAL LOW (ref >90)
Glucose, Bld: 96 mg/dL (ref 70–99)
Potassium: 4.3 mEq/L (ref 3.7–5.3)
Sodium: 135 mEq/L — ABNORMAL LOW (ref 137–147)

## 2013-05-29 LAB — IRON AND TIBC
Iron: 24 ug/dL — ABNORMAL LOW (ref 42–135)
Saturation Ratios: 10 % — ABNORMAL LOW (ref 20–55)
TIBC: 238 ug/dL — ABNORMAL LOW (ref 250–470)
UIBC: 214 ug/dL (ref 125–400)

## 2013-05-29 LAB — FERRITIN: Ferritin: 159 ng/mL (ref 10–291)

## 2013-05-29 MED ORDER — METOPROLOL TARTRATE 25 MG PO TABS: 25.0000 mg | ORAL_TABLET | Freq: Two times a day (BID) | ORAL | Status: DC

## 2013-05-29 MED ORDER — FERROUS SULFATE 325 (65 FE) MG PO TABS: 325.0000 mg | ORAL_TABLET | Freq: Every day | ORAL | Status: DC

## 2013-05-29 MED ORDER — ASPIRIN 81 MG PO TBEC: 81.0000 mg | DELAYED_RELEASE_TABLET | Freq: Every day | ORAL | Status: DC

## 2013-05-29 MED ORDER — AMLODIPINE BESYLATE 5 MG PO TABS
5.0000 mg | ORAL_TABLET | Freq: Every day | ORAL | Status: DC
  Filled 2013-05-29: qty 1

## 2013-05-29 MED ORDER — AMLODIPINE BESYLATE 5 MG PO TABS
5.0000 mg | ORAL_TABLET | Freq: Every day | ORAL | Status: DC
  Administered 2013-05-29: 5 mg via ORAL
  Filled 2013-05-29: qty 1

## 2013-05-29 MED ORDER — ALPRAZOLAM 0.25 MG PO TABS: 0.2500 mg | ORAL_TABLET | Freq: Two times a day (BID) | ORAL | Status: DC | PRN

## 2013-05-29 MED ORDER — AMLODIPINE BESYLATE 5 MG PO TABS: 5.0000 mg | ORAL_TABLET | Freq: Every day | ORAL | Status: DC

## 2013-05-29 MED ORDER — METOPROLOL TARTRATE 25 MG PO TABS
25.0000 mg | ORAL_TABLET | Freq: Two times a day (BID) | ORAL | Status: DC
Start: 2013-05-29 — End: 2013-05-29
  Administered 2013-05-29: 25 mg via ORAL
  Filled 2013-05-29: qty 1

## 2013-05-29 NOTE — Discharge Summary (Signed)
Physician Discharge Summary  Patient ID: Theresa Norton MRN: [id number] DOB/AGE: 1929/03/05 80 y.o.  Admit date: 05/26/2013 Discharge date: 05/29/2013  Admission Diagnoses: Complete heart block  Hypertension  Anemia of blood loss  Discharge Diagnoses:  Principle Problem: * Complete heart block * Hypertension Mild native vessel coronary artery disease  Anemia of blood loss and iron deficiency Anxiety Weakness  Discharged Condition: fair  Hospital Course: 80 year old female with past medical history of hypertension presents today with complaints of elevated blood pressure, weakness, fatigue, and low heart rate. She underwent cardiac catheterization by me showing mild coronary artery disease followed by Medtronic dual chamber pacemaker placement by Dr. Thompson Grayer. Her blood pressure was controlled with addition of B-blocker and amlodipine along with resuming Losartan. She was started on Iron pill for her anemia of blood loss and iron deficiency. She will have home health nurse and PT to improve ambulation.  Consults: cardiology  Significant Diagnostic Studies: labs: Near normal CBC and BMET.  EKG-3rd. Degree heart block with ventricular rate of 39, RBBB and low voltage. Post pacemaker-SR with V-paced rhythm, LBBB.  Chest X-ray: No active disease. Dual lead cardiac pacemaker in place. No diagnostic pneumothorax.  Cardiac cath-Mild native vessel coronary artery disease.  Treatments: cardiac meds: metoprolol, amlodipine and Losartan and procedures: Medtronic model ADDRL 1 Dual chamber pacemaker placement.  Discharge Exam: Blood pressure 120/73, pulse 83, temperature 97.9 F (36.6 C), temperature source Oral, resp. rate 15, height 5\' 3"  (1.6 m), weight 72.7 kg (160 lb 4.4 oz), SpO2 97.00%. HEENT: [place]/AT, Eyes-Hazel, PERL, EOMI, Conjunctiva-Pink, Sclera-Non-icteric  Neck: No JVD, No bruit, Trachea midline.  Lungs: Clear, Bilateral. Left pectoral pacer pocket-stable.  Cardiac:  Regular rhythm, normal S1 and S2, no S3. II/VI systolic murmur.  Abdomen: Soft, non-tender.  Extremities: No edema present. No cyanosis. No clubbing. Large right groin ecchymosis.  CNS: AxOx3, Cranial nerves grossly intact, moves all 4 extremities. Right handed.  Skin: Warm and dry.  Disposition: 01-Home or Self Care   Future Appointments Provider Department Dept Phone   06/05/2013 3:00 PM [facility] [place] [phone]   10/21/2013 11:00 AM Eulas Post, MD [place] [facility] [place]       Medication List         ALPRAZolam 0.25 MG tablet  Commonly known as:  XANAX  Take 1 tablet (0.25 mg total) by mouth 2 (two) times daily as needed for anxiety.     amLODipine 5 MG tablet  Commonly known as:  NORVASC  Take 1 tablet (5 mg total) by mouth daily.     aspirin 81 MG EC tablet  Take 1 tablet (81 mg total) by mouth daily.     BLACK CURRANT SEED OIL PO  Take 1 tablet by mouth daily.     ferrous sulfate 325 (65 FE) MG tablet  Take 1 tablet (325 mg total) by mouth daily with breakfast.  Start taking on:  05/30/2013     losartan 100 MG tablet  Commonly known as:  COZAAR  Take 1 tablet (100 mg total) by mouth daily.     metoprolol tartrate 25 MG tablet  Commonly known as:  LOPRESSOR  Take 1 tablet (25 mg total) by mouth 2 (two) times daily.           Follow-up Information   Follow up with [facility] On 06/05/2013. (At 3:00 PM for wound check)    Specialty:  Cardiology   Contact information:  [address] [place] [address] [phone]      Follow up with Eulas Post, MD. Schedule an appointment as soon as possible for a visit in 1 month.   Specialty:  Family Medicine   Contact information:   [place] [address] [phone]       Follow up with [facility], MD. Schedule an appointment as soon as possible for a visit in 1 week.    Specialty:  Cardiology   Contact information:   [place] [address] [phone]       Signed: Birdie Riddle 05/29/2013, 1:55 PM

## 2013-05-29 NOTE — Progress Notes (Signed)
   SUBJECTIVE:  At this time, she denies chest pain, shortness of breath, or any new concerns.  . ALPRAZolam  0.25 mg Oral BID  . aspirin EC  81 mg Oral Daily  . docusate sodium  100 mg Oral BID  . losartan  100 mg Oral Daily  . multivitamin with minerals  1 tablet Oral Daily      OBJECTIVE: Physical Exam: Filed Vitals:   05/29/13 0345 05/29/13 0400 05/29/13 0500 05/29/13 0600  BP: 155/87 176/80 178/92 192/93  Pulse: 78 72 87 81  Temp:      TempSrc:      Resp: 21 22 19 21   Height:      Weight:      SpO2: 100% 100% 95% 99%    Intake/Output Summary (Last 24 hours) at 05/29/13 4098 Last data filed at 05/28/13 0800  Gross per 24 hour  Intake     50 ml  Output      0 ml  Net     50 ml    Telemetry reveals sinus rhythm with V pacing  GEN- The patient is sleeping but rouses, NAD Head- normocephalic, atraumatic Eyes-  Sclera clear, conjunctiva pink Ears- hearing intact Oropharynx- clear Neck- supple,  Lungs- Clear to ausculation bilaterally, normal work of breathing Heart- Regular rate and rhythm  GI- soft, NT, ND, + BS Extremities- no clubbing, cyanosis, or edema Skin- pacemaker site is without hematoma  LABS: Basic Metabolic Panel:  Recent Labs  05/28/13 0750 05/29/13 0311  NA 138 135*  K 4.1 4.3  CL 103 101  CO2 21 21  GLUCOSE 96 96  BUN 13 14  CREATININE 0.76 0.68  CALCIUM 8.0* 8.2*   Liver Function Tests: No results found for this basename: AST, ALT, ALKPHOS, BILITOT, PROT, ALBUMIN,  in the last 72 hours No results found for this basename: LIPASE, AMYLASE,  in the last 72 hours CBC:  Recent Labs  05/28/13 0750 05/29/13 0311  WBC 5.2 5.0  HGB 10.1* 9.9*  HCT 29.3* 29.4*  MCV 96.7 97.4  PLT 145* 133*  RADIOLOGY: CXR is pending  ASSESSMENT AND PLAN:  Active Problems:   Hypertension   Complete heart block  1. Complete heart block Stable s/p PPM  2. HTN Dr Doylene Canard to gradually add back antihypertensives Would consider adding  norvasc  Up today with assistance Will order PT consult  Could transfer to stepdown today. Once BP is controlled transfer to telemetry Hopefully home in the next 1-2 days (once ambulatory and BP is controlled) No further inpatient EP workup planned Routine wound care and outpatient device follow-up  I will see as needed while here. Please call with questions.    Thompson Grayer, MD 05/29/2013 7:12 AM   Addendum CXR is reviewed No ptx, stable leads

## 2013-05-29 NOTE — Evaluation (Signed)
Physical Therapy Evaluation Patient Details Name: Theresa Norton MRN: [id number] DOB: Sep 11, 1928 Today's Date: 05/29/2013 Time: 2563-8937 PT Time Calculation (min): 25 min  PT Assessment / Plan / Recommendation History of Present Illness  80 year old female with past medical history of hypertension presents today with complaints of elevated blood pressure, weakness, fatigue, and low heart rate. She underwent cardiac catheterization by me showing mild coronary artery disease followed by Medtronic dual chamber pacemaker placement by Dr. Thompson Grayer  Clinical Impression  Pt very pleasant and demonstrates decreased activity tolerance and balance from baseline and will benefit from acute as well as HHPT to maximize gait, balance, strength and function to return pt to independent level. Pt educated for ROM restrictions s/p pacemaker and implications for bathing, dressing and ADLs- pt verbalized understanding but will need assist to complete at home. Pt too fatigued with gait today to attempt stairs and will perform next session. Pt encouraged to continue gait and HEP acutely.     PT Assessment  Patient needs continued PT services    Follow Up Recommendations  Home health PT;Supervision - Intermittent    Does the patient have the potential to tolerate intense rehabilitation      Barriers to Discharge Decreased caregiver support      Equipment Recommendations  None recommended by PT    Recommendations for Other Services     Frequency Min 3X/week    Precautions / Restrictions Precautions Precautions: Fall;ICD/Pacemaker   Pertinent Vitals/Pain Soreness at incision HR 73-84 BP 137/92 after walking 97% on RA      Mobility  Transfers Overall transfer level: Needs assistance Transfers: Sit to/from Stand Sit to Stand: Supervision General transfer comment: cueing for sequence and to adhere to precautions not pushing with LUE Ambulation/Gait Ambulation/Gait assistance: Min  assist Ambulation Distance (Feet): 200 Feet Assistive device: None Gait Pattern/deviations: Step-through pattern;Decreased stride length;Drifts right/left;Narrow base of support Gait velocity interpretation: Below normal speed for age/gender General Gait Details: pt with 2 partial LOB with assist to correct balance and on second half of gait returning to room pt reaching out for handrail. Recommend use of RW acutely    Exercises General Exercises - Lower Extremity Long Arc Quad: AROM;Seated;Both;15 reps Hip Flexion/Marching: AROM;Seated;Both;15 reps Toe Raises: AROM;Seated;Both;15 reps   PT Diagnosis: Difficulty walking  PT Problem List: Decreased strength;Decreased activity tolerance;Decreased balance;Decreased mobility;Decreased knowledge of precautions PT Treatment Interventions: Gait training;Stair training;Functional mobility training;Therapeutic activities;Therapeutic exercise;Patient/family education;DME instruction     PT Goals(Current goals can be found in the care plan section) Acute Rehab PT Goals Patient Stated Goal: be able to be wonder woman PT Goal Formulation: With patient/family Time For Goal Achievement: 06/05/13 Potential to Achieve Goals: Good  Visit Information  Last PT Received On: 05/29/13 Assistance Needed: +1 History of Present Illness: 80 year old female with past medical history of hypertension presents today with complaints of elevated blood pressure, weakness, fatigue, and low heart rate. She underwent cardiac catheterization by me showing mild coronary artery disease followed by Medtronic dual chamber pacemaker placement by Dr. Thompson Grayer       Prior Functioning  Home Living Family/patient expects to be discharged to:: Private residence Living Arrangements: Children Available Help at Discharge: Family;Available PRN/intermittently Type of Home: House Home Access: Stairs to enter [employer] of Steps: 4 Entrance Stairs-Rails: Right Home  Layout: Two level;Able to live on main level with bedroom/bathroom Home Equipment: Gilford Rile - 2 wheels;Cane - single point;Shower seat Prior Function Level of Independence: Independent Communication Communication: No difficulties  Cognition  Cognition Arousal/Alertness: Awake/alert Behavior During Therapy: WFL for tasks assessed/performed Overall Cognitive Status: Within Functional Limits for tasks assessed    Extremity/Trunk Assessment Upper Extremity Assessment Upper Extremity Assessment: Generalized weakness Lower Extremity Assessment Lower Extremity Assessment: Generalized weakness Cervical / Trunk Assessment Cervical / Trunk Assessment: Normal   Balance Balance Overall balance assessment: Needs assistance Sitting balance-Leahy Scale: Good Standing balance-Leahy Scale: Fair  End of Session PT - End of Session Equipment Utilized During Treatment: Gait belt Activity Tolerance: Patient tolerated treatment well Patient left: in chair;with call bell/phone within reach;with family/visitor present Nurse Communication: Mobility status  GP     Melford Aase 05/29/2013, 2:33 PM Elwyn Reach, [place]

## 2013-05-29 NOTE — Progress Notes (Signed)
Spoke with Cecilie Kicks, NP r/t pacer site dressing. Order received to remove dressing prior to discharge leaving steri strips intact. Dressing removed. Steri strips intact. No drainage noted. Patient instructed on care and verbalized using teach back.

## 2013-05-29 NOTE — Care Management Note (Signed)
    Page 1 of 1   05/29/2013     2:10:02 PM   CARE MANAGEMENT NOTE 05/29/2013  Patient:  Theresa Norton, Theresa Norton   Account Number:  [id number]  Date Initiated:  05/27/2013  Documentation initiated by:  [facility]  Subjective/Objective Assessment:   Admitted with [facility] - external pacer on  - ?? need for perm     Action/Plan:   Anticipated DC Date:  05/29/2013   Anticipated DC Plan:  [place]  CM consult      [facility] Choice  HOME HEALTH   Choice offered to / List presented to:  C-1 Patient        [place] arranged  [place] [facility]      [place].   Status of service:  Completed, signed off Medicare Important Message given?   (If response is "NO", the following Medicare IM given date fields will be blank) Date Medicare IM given:   Date Additional Medicare IM given:    Discharge Disposition:  [place]  Per UR Regulation:  Reviewed for med. necessity/level of care/duration of stay  If discussed at [place] of Stay Meetings, dates discussed:    Comments:  ContactCalise, [place] Daughter [phone]  1/22 1408 debbie Jericha Bryden rn,bsn spoke w pt and went over list of hhc agencies. she [facility], ref to donna w [facility] for hhpt.

## 2013-05-29 NOTE — Progress Notes (Signed)
Patient complaining of chest discomfort. States it feels like pressure. Vital signs remain stable and patient asymptomatic. Stat EKG obtained. Dr. Doylene Canard notified. Stated patient has been cathed and clean coronaries noted. Patient given tylenol for mid and left sided pacer insertion site pain at 1415. Reassurance given to patient and patients daughter. Awaiting arrival of MD. Will continue to monitor patient.

## 2013-05-31 ENCOUNTER — Emergency Department (HOSPITAL_COMMUNITY)
Admission: EM | Admit: 2013-05-31 | Discharge: 2013-05-31 | Disposition: A | Discharge status: Home / Self Care | Payer: Medicare Other | Attending: Emergency Medicine | Admitting: Emergency Medicine

## 2013-05-31 DIAGNOSIS — M129 Arthropathy, unspecified: Secondary | ICD-10-CM | POA: Diagnosis not present

## 2013-05-31 DIAGNOSIS — Z860100 Personal history of colon polyps, unspecified: Secondary | ICD-10-CM | POA: Insufficient documentation

## 2013-05-31 DIAGNOSIS — IMO0002 Reserved for concepts with insufficient information to code with codable children: Secondary | ICD-10-CM | POA: Diagnosis not present

## 2013-05-31 DIAGNOSIS — Z7982 Long term (current) use of aspirin: Secondary | ICD-10-CM | POA: Diagnosis not present

## 2013-05-31 DIAGNOSIS — Z950 Presence of cardiac pacemaker: Secondary | ICD-10-CM | POA: Insufficient documentation

## 2013-05-31 DIAGNOSIS — I10 Essential (primary) hypertension: Secondary | ICD-10-CM | POA: Diagnosis not present

## 2013-05-31 DIAGNOSIS — T8140XA Infection following a procedure, unspecified, initial encounter: Secondary | ICD-10-CM | POA: Diagnosis not present

## 2013-05-31 DIAGNOSIS — Z79899 Other long term (current) drug therapy: Secondary | ICD-10-CM | POA: Insufficient documentation

## 2013-05-31 DIAGNOSIS — Y840 Cardiac catheterization as the cause of abnormal reaction of the patient, or of later complication, without mention of misadventure at the time of the procedure: Secondary | ICD-10-CM | POA: Insufficient documentation

## 2013-05-31 DIAGNOSIS — S45909A Unspecified injury of unspecified blood vessel at shoulder and upper arm level, unspecified arm, initial encounter: Secondary | ICD-10-CM | POA: Diagnosis not present

## 2013-05-31 DIAGNOSIS — T819XXA Unspecified complication of procedure, initial encounter: Secondary | ICD-10-CM | POA: Diagnosis not present

## 2013-05-31 LAB — CBC WITH DIFFERENTIAL/PLATELET
Basophils Absolute: 0 10*3/uL (ref 0.0–0.1)
Basophils Relative: 0 % (ref 0–1)
Eosinophils Absolute: 0 10*3/uL (ref 0.0–0.7)
Eosinophils Relative: 0 % (ref 0–5)
HCT: 25 % — ABNORMAL LOW (ref 36.0–46.0)
Hemoglobin: 8.9 g/dL — ABNORMAL LOW (ref 12.0–15.0)
Lymphocytes Relative: 7 % — ABNORMAL LOW (ref 12–46)
Lymphs Abs: 0.4 10*3/uL — ABNORMAL LOW (ref 0.7–4.0)
MCH: 33.5 pg (ref 26.0–34.0)
MCHC: 35.6 g/dL (ref 30.0–36.0)
MCV: 94 fL (ref 78.0–100.0)
Monocytes Absolute: 0.8 10*3/uL (ref 0.1–1.0)
Monocytes Relative: 13 % — ABNORMAL HIGH (ref 3–12)
Neutro Abs: 5.1 10*3/uL (ref 1.7–7.7)
Neutrophils Relative %: 80 % — ABNORMAL HIGH (ref 43–77)
Platelets: DECREASED 10*3/uL (ref 150–400)
RBC: 2.66 MIL/uL — ABNORMAL LOW (ref 3.87–5.11)
RDW: 13.2 % (ref 11.5–15.5)
WBC: 6.3 10*3/uL (ref 4.0–10.5)

## 2013-05-31 LAB — BASIC METABOLIC PANEL
BUN: 14 mg/dL (ref 6–23)
CO2: 21 mEq/L (ref 19–32)
Calcium: 8.6 mg/dL (ref 8.4–10.5)
Chloride: 96 mEq/L (ref 96–112)
Creatinine, Ser: 0.67 mg/dL (ref 0.50–1.10)
GFR calc Af Amer: >90 mL/min (ref >90)
GFR calc non Af Amer: 80 mL/min — ABNORMAL LOW (ref >90)
Glucose, Bld: 110 mg/dL — ABNORMAL HIGH (ref 70–99)
Potassium: 4.4 mEq/L (ref 3.7–5.3)
Sodium: 133 mEq/L — ABNORMAL LOW (ref 137–147)

## 2013-05-31 MED ORDER — SODIUM CHLORIDE 0.9 % IV SOLN: INTRAVENOUS | Status: DC

## 2013-05-31 NOTE — ED Notes (Signed)
Pts daughter and son at beside, they do not want an IV started until pt eats yogurt and drinks fluids.  States IV will be impossible and they do not want their mother stuck for an IV at this time.  Dr. Zenia Resides aware, ok to give foods/fluids and wait on IV.

## 2013-05-31 NOTE — ED Notes (Signed)
PT sent home sp cardiac cath and pace maker on WED night. Pt reports post cath bleeding from RT groin site . Two person at bed side to apply pressure post cath.

## 2013-05-31 NOTE — ED Notes (Signed)
Pt undressed, in gown, on monitor, continuous pulse oximetry and blood pressure cuff; gamily at bedside

## 2013-05-31 NOTE — ED Provider Notes (Signed)
CSN: [id number]     Arrival date & time 05/31/13  1030 History   First MD Initiated Contact with Patient 05/31/13 1032     Chief Complaint  Patient presents with  . Bleeding/Bruising    at cardiac cath    (Consider location/radiation/quality/duration/timing/severity/associated sxs/prior Treatment) The history is provided by the patient.   patient here complaining of right groin pain and firmness. Had a cardiac catheterization performed 3 days ago at the right femoral site. Did have some postop bleeding which is since resolved. Notes increased oozing to her right anterior thigh and flank along with her right groin. Denies any numbness or tingling to the lower extremity. No syncope or near-syncope. No chest pain or shortness of breath. No treatment used prior to arrival. Symptoms have been persistent and nothing makes them better or worse  Past Medical History  Diagnosis Date  . Arthritis   . Allergy   . Hypertension   . Colon polyps   . Heart block AV second degree     s/p MDT Addapta dual chamber pacemaker 05/2013 by Dr Rayann Heman   Past Surgical History  Procedure Laterality Date  . Breast surgery  1959    cyst removal  . Bunionectomy  1967    both feet  . Pacemaker insertion  05/27/2013    MDT ADDRL1 implanted by Dr Rayann Heman for heart block   Family History  Problem Relation Age of Onset  . Cancer Father     lung   History  Substance Use Topics  . Smoking status: Never Smoker   . Smokeless tobacco: Not on file  . Alcohol Use: Yes     Comment: daily   OB History   Grav Para Term Preterm Abortions TAB SAB Ect Mult Living                 Review of Systems  All other systems reviewed and are negative.    Allergies  Codeine and Morphine and related  Home Medications   Current Outpatient Rx  Name  Route  Sig  Dispense  Refill  . acetaminophen (TYLENOL) 325 MG tablet   Oral   Take 325 mg by mouth every 6 (six) hours as needed for mild pain.         Marland Kitchen ALPRAZolam  (XANAX) 0.25 MG tablet   Oral   Take 1 tablet (0.25 mg total) by mouth 2 (two) times daily as needed for anxiety.   60 tablet   0   . amLODipine (NORVASC) 5 MG tablet   Oral   Take 1 tablet (5 mg total) by mouth daily.   30 tablet   1   . aspirin EC 81 MG EC tablet   Oral   Take 1 tablet (81 mg total) by mouth daily.         Marland Kitchen BLACK CURRANT SEED OIL PO   Oral   Take 1 tablet by mouth daily.         . ferrous sulfate 325 (65 FE) MG tablet   Oral   Take 1 tablet (325 mg total) by mouth daily with breakfast.   30 tablet   3   . losartan (COZAAR) 100 MG tablet   Oral   Take 1 tablet (100 mg total) by mouth daily.   30 tablet   11   . metoprolol tartrate (LOPRESSOR) 25 MG tablet   Oral   Take 1 tablet (25 mg total) by mouth 2 (two) times daily.   [place]  tablet   1    SpO2 90% Physical Exam  Nursing note and vitals reviewed. Constitutional: She is oriented to person, place, and time. She appears well-developed and well-nourished.  Non-toxic appearance. No distress.  HENT:  Head: Normocephalic and atraumatic.  Eyes: Conjunctivae, EOM and lids are normal. Pupils are equal, round, and reactive to light.  Neck: Normal range of motion. Neck supple. No tracheal deviation present. No mass present.  Cardiovascular: Normal rate, regular rhythm and normal heart sounds.  Exam reveals no gallop.   No murmur heard. Pulmonary/Chest: Effort normal and breath sounds normal. No stridor. No respiratory distress. She has no decreased breath sounds. She has no wheezes. She has no rhonchi. She has no rales.  Abdominal: Soft. Normal appearance and bowel sounds are normal. She exhibits no distension. There is no tenderness. There is no rebound and no CVA tenderness.  Musculoskeletal: Normal range of motion. She exhibits no edema.       Right hip: She exhibits tenderness. She exhibits no deformity.       Legs: Neurological: She is alert and oriented to person, place, and time. She has normal  strength. No cranial nerve deficit or sensory deficit. GCS eye subscore is 4. GCS verbal subscore is 5. GCS motor subscore is 6.  Skin: Skin is warm and dry. No abrasion and no rash noted.  Psychiatric: She has a normal mood and affect. Her speech is normal and behavior is normal.    ED Course  Procedures (including critical care time) Labs Review Labs Reviewed - No data to display Imaging Review No results found.  EKG Interpretation    Date/Time:  Saturday May 31 2013 10:42:44 EST Ventricular Rate:  90 PR Interval:  220 QRS Duration: 142 QT Interval:  388 QTC Calculation: 475 R Axis:   -50 Text Interpretation:  Sinus rhythm Prolonged PR interval Left bundle branch block Inferior infarct, acute No significant change since last tracing Confirmed by Neil Errickson  MD, Pecola Haxton ([id number]) on 05/31/2013 10:50:09 AM            MDM  No diagnosis found.   Patient without signs of pseudoaneurysm on right femoral artery ultrasound. Patient's hemoglobin is stable. She'll be discharged home with followup with her Dr. as needed  Leota Jacobsen, MD 05/31/13 1444

## 2013-05-31 NOTE — Progress Notes (Signed)
VASCULAR LAB PRELIMINARY  PRELIMINARY  PRELIMINARY  PRELIMINARY  Right femoral artery ultrasound completed.    Preliminary report:  No evidence of  A right pseudoaneurysm or A -V fistula. There is a large hematoma coursing 7.49 cm from the inguinal line into the thigh.  Rickia Freeburg, RVS 05/31/2013, 1:52 PM

## 2013-05-31 NOTE — Discharge Instructions (Signed)
Followup with your cardiologist as needed. Return here for any problems

## 2013-06-02 DIAGNOSIS — Z7901 Long term (current) use of anticoagulants: Secondary | ICD-10-CM | POA: Diagnosis not present

## 2013-06-02 DIAGNOSIS — I10 Essential (primary) hypertension: Secondary | ICD-10-CM | POA: Diagnosis not present

## 2013-06-02 DIAGNOSIS — I442 Atrioventricular block, complete: Secondary | ICD-10-CM | POA: Diagnosis not present

## 2013-06-02 DIAGNOSIS — D509 Iron deficiency anemia, unspecified: Secondary | ICD-10-CM | POA: Diagnosis not present

## 2013-06-02 DIAGNOSIS — Z45018 Encounter for adjustment and management of other part of cardiac pacemaker: Secondary | ICD-10-CM | POA: Diagnosis not present

## 2013-06-02 DIAGNOSIS — Z48812 Encounter for surgical aftercare following surgery on the circulatory system: Secondary | ICD-10-CM | POA: Diagnosis not present

## 2013-06-05 ENCOUNTER — Ambulatory Visit: Payer: Medicare Other

## 2013-06-05 ENCOUNTER — Encounter: Payer: Medicare Other | Admitting: *Deleted

## 2013-06-05 ENCOUNTER — Telehealth: Payer: Self-pay | Admitting: *Deleted

## 2013-06-05 ENCOUNTER — Encounter: Payer: Self-pay | Admitting: Internal Medicine

## 2013-06-05 ENCOUNTER — Ambulatory Visit (INDEPENDENT_AMBULATORY_CARE_PROVIDER_SITE_OTHER): Payer: Medicare Other | Admitting: Internal Medicine

## 2013-06-05 DIAGNOSIS — Z950 Presence of cardiac pacemaker: Secondary | ICD-10-CM

## 2013-06-05 DIAGNOSIS — R030 Elevated blood-pressure reading, without diagnosis of hypertension: Secondary | ICD-10-CM | POA: Diagnosis not present

## 2013-06-05 DIAGNOSIS — I4891 Unspecified atrial fibrillation: Secondary | ICD-10-CM | POA: Diagnosis not present

## 2013-06-05 DIAGNOSIS — I10 Essential (primary) hypertension: Secondary | ICD-10-CM | POA: Diagnosis not present

## 2013-06-05 DIAGNOSIS — I442 Atrioventricular block, complete: Secondary | ICD-10-CM

## 2013-06-05 DIAGNOSIS — IMO0001 Reserved for inherently not codable concepts without codable children: Secondary | ICD-10-CM

## 2013-06-05 DIAGNOSIS — I2510 Atherosclerotic heart disease of native coronary artery without angina pectoris: Secondary | ICD-10-CM | POA: Diagnosis not present

## 2013-06-05 LAB — MDC_IDC_ENUM_SESS_TYPE_INCLINIC
Battery Impedance: 100 Ohm
Battery Remaining Longevity: 122 mo
Battery Voltage: 2.8 V
Brady Statistic AP VP Percent: 2 %
Brady Statistic AP VS Percent: 0 %
Brady Statistic AS VP Percent: 98 %
Brady Statistic AS VS Percent: 0 %
Date Time Interrogation Session: 20150129144637
Lead Channel Impedance Value: 426 Ohm
Lead Channel Impedance Value: 698 Ohm
Lead Channel Pacing Threshold Amplitude: 0.5 V
Lead Channel Pacing Threshold Pulse Width: 0.4 ms
Lead Channel Sensing Intrinsic Amplitude: 0.7 mV
Lead Channel Sensing Intrinsic Amplitude: 5.6 mV
Lead Channel Setting Pacing Amplitude: 3.5 V
Lead Channel Setting Pacing Amplitude: 3.5 V
Lead Channel Setting Pacing Pulse Width: 0.4 ms
Lead Channel Setting Sensing Sensitivity: 4 mV

## 2013-06-05 MED ORDER — RIVAROXABAN 20 MG PO TABS: 20.0000 mg | ORAL_TABLET | Freq: Every day | ORAL | Status: DC

## 2013-06-05 NOTE — Patient Instructions (Addendum)
Your physician recommends that you schedule a follow-up appointment as scheduled   Your physician has recommended you make the following change in your medication:  1) Start Xarelto 20mg  daily 2) Stop Aspirin

## 2013-06-05 NOTE — Telephone Encounter (Signed)
PA for xarelto through [employer]

## 2013-06-05 NOTE — Progress Notes (Signed)
Wound check appointment---originally on device clinic schedule but seen by Dr. Lovena Le due to newly discovered A-fib.   Steri-strips removed. Wound without redness or edema. Incision edges approximated, wound well healed. Normal device function. Thresholds, sensing, and impedances consistent with implant measurements. Device programmed at 3.5V/auto capture programmed on for extra safety margin until 3 month visit. Histogram distribution appropriate for patient and level of activity.   **Newly developed Afib, mode switch 36.5% ---started xarelto 20mg . No high ventricular rates noted. Patient educated about wound care, arm mobility, lifting restrictions.   ROV w/ Dr. Rayann Heman 09/04/13 @ 11:00.

## 2013-06-06 DIAGNOSIS — D509 Iron deficiency anemia, unspecified: Secondary | ICD-10-CM | POA: Diagnosis not present

## 2013-06-06 DIAGNOSIS — I442 Atrioventricular block, complete: Secondary | ICD-10-CM | POA: Diagnosis not present

## 2013-06-06 DIAGNOSIS — I10 Essential (primary) hypertension: Secondary | ICD-10-CM | POA: Diagnosis not present

## 2013-06-06 DIAGNOSIS — Z45018 Encounter for adjustment and management of other part of cardiac pacemaker: Secondary | ICD-10-CM | POA: Diagnosis not present

## 2013-06-06 DIAGNOSIS — Z7901 Long term (current) use of anticoagulants: Secondary | ICD-10-CM | POA: Diagnosis not present

## 2013-06-06 DIAGNOSIS — Z48812 Encounter for surgical aftercare following surgery on the circulatory system: Secondary | ICD-10-CM | POA: Diagnosis not present

## 2013-06-07 ENCOUNTER — Emergency Department (HOSPITAL_COMMUNITY): Payer: Medicare Other

## 2013-06-07 ENCOUNTER — Emergency Department (HOSPITAL_COMMUNITY)
Admission: EM | Admit: 2013-06-07 | Discharge: 2013-06-07 | Disposition: A | Discharge status: Home / Self Care | Payer: Medicare Other | Attending: Emergency Medicine | Admitting: Emergency Medicine

## 2013-06-07 ENCOUNTER — Encounter (HOSPITAL_COMMUNITY): Payer: Self-pay | Admitting: Emergency Medicine

## 2013-06-07 DIAGNOSIS — R42 Dizziness and giddiness: Secondary | ICD-10-CM | POA: Insufficient documentation

## 2013-06-07 DIAGNOSIS — M129 Arthropathy, unspecified: Secondary | ICD-10-CM | POA: Insufficient documentation

## 2013-06-07 DIAGNOSIS — I10 Essential (primary) hypertension: Secondary | ICD-10-CM | POA: Diagnosis not present

## 2013-06-07 DIAGNOSIS — Z79899 Other long term (current) drug therapy: Secondary | ICD-10-CM | POA: Insufficient documentation

## 2013-06-07 DIAGNOSIS — R55 Syncope and collapse: Secondary | ICD-10-CM | POA: Insufficient documentation

## 2013-06-07 DIAGNOSIS — I4891 Unspecified atrial fibrillation: Secondary | ICD-10-CM

## 2013-06-07 DIAGNOSIS — R110 Nausea: Secondary | ICD-10-CM | POA: Diagnosis not present

## 2013-06-07 DIAGNOSIS — Z860100 Personal history of colon polyps, unspecified: Secondary | ICD-10-CM | POA: Insufficient documentation

## 2013-06-07 DIAGNOSIS — J90 Pleural effusion, not elsewhere classified: Secondary | ICD-10-CM | POA: Diagnosis not present

## 2013-06-07 DIAGNOSIS — R0602 Shortness of breath: Secondary | ICD-10-CM | POA: Diagnosis not present

## 2013-06-07 DIAGNOSIS — I509 Heart failure, unspecified: Secondary | ICD-10-CM | POA: Diagnosis not present

## 2013-06-07 DIAGNOSIS — J189 Pneumonia, unspecified organism: Secondary | ICD-10-CM

## 2013-06-07 HISTORY — DX: Anemia, unspecified: D64.9

## 2013-06-07 HISTORY — DX: Paroxysmal atrial fibrillation: I48.0

## 2013-06-07 HISTORY — DX: Contusion of abdominal wall, initial encounter: S30.1XXA

## 2013-06-07 HISTORY — DX: Atherosclerotic heart disease of native coronary artery without angina pectoris: I25.10

## 2013-06-07 LAB — BASIC METABOLIC PANEL
BUN: 11 mg/dL (ref 6–23)
CO2: 20 mEq/L (ref 19–32)
Calcium: 8.9 mg/dL (ref 8.4–10.5)
Chloride: 97 mEq/L (ref 96–112)
Creatinine, Ser: 0.71 mg/dL (ref 0.50–1.10)
GFR calc Af Amer: >90 mL/min (ref >90)
GFR calc non Af Amer: 78 mL/min — ABNORMAL LOW (ref >90)
Glucose, Bld: 123 mg/dL — ABNORMAL HIGH (ref 70–99)
Potassium: 4.6 mEq/L (ref 3.7–5.3)
Sodium: 134 mEq/L — ABNORMAL LOW (ref 137–147)

## 2013-06-07 LAB — CBC WITH DIFFERENTIAL/PLATELET
Basophils Absolute: 0 10*3/uL (ref 0.0–0.1)
Basophils Relative: 0 % (ref 0–1)
Eosinophils Absolute: 0.1 K/uL (ref 0.0–0.7)
Eosinophils Relative: 1 % (ref 0–5)
HCT: 29 % — ABNORMAL LOW (ref 36.0–46.0)
Hemoglobin: 9.8 g/dL — ABNORMAL LOW (ref 12.0–15.0)
Lymphocytes Relative: 7 % — ABNORMAL LOW (ref 12–46)
Lymphs Abs: 0.8 10*3/uL (ref 0.7–4.0)
MCH: 32.9 pg (ref 26.0–34.0)
MCHC: 33.8 g/dL (ref 30.0–36.0)
MCV: 97.3 fL (ref 78.0–100.0)
Monocytes Absolute: 0.9 10*3/uL (ref 0.1–1.0)
Monocytes Relative: 8 % (ref 3–12)
Neutro Abs: 9.9 10*3/uL — ABNORMAL HIGH (ref 1.7–7.7)
Neutrophils Relative %: 84 % — ABNORMAL HIGH (ref 43–77)
Platelets: 462 10*3/uL — ABNORMAL HIGH (ref 150–400)
RBC: 2.98 MIL/uL — ABNORMAL LOW (ref 3.87–5.11)
RDW: 14.1 % (ref 11.5–15.5)
WBC: 11.7 10*3/uL — ABNORMAL HIGH (ref 4.0–10.5)

## 2013-06-07 LAB — PROTIME-INR
INR: 1.2 (ref 0.00–1.49)
Prothrombin Time: 14.9 seconds (ref 11.6–15.2)

## 2013-06-07 LAB — TROPONIN I: Troponin I: <0.3 ng/mL (ref <0.30)

## 2013-06-07 LAB — PRO B NATRIURETIC PEPTIDE: Pro B Natriuretic peptide (BNP): 985.3 pg/mL — ABNORMAL HIGH (ref 0–450)

## 2013-06-07 LAB — APTT: aPTT: 31 seconds (ref 24–37)

## 2013-06-07 MED ORDER — METOPROLOL TARTRATE 50 MG PO TABS: 50.0000 mg | ORAL_TABLET | Freq: Two times a day (BID) | ORAL | Status: DC

## 2013-06-07 MED ORDER — AZITHROMYCIN 250 MG PO TABS: 250.0000 mg | ORAL_TABLET | Freq: Every day | ORAL | Status: DC

## 2013-06-07 NOTE — ED Notes (Signed)
Patient ambulated around in department. No C/O  being dizzy. Did C/O being short of breath, HR 112. Patient returned to bed Sats 88% RA with HR of 90. Patient sats returned to 99% RA, HR to 80's within one min sitting on bed. With out C/O any shortness of breath.

## 2013-06-07 NOTE — ED Notes (Signed)
Pt arrived by [facility] from home. Reports recent pacemaker insertion on 1/20. Had sudden onset of sob, dizziness and near syncope while standing up at home. Denies any cp or sob. No acute distress noted on arrival.

## 2013-06-07 NOTE — Discharge Instructions (Signed)
Atrial Fibrillation Atrial fibrillation is a type of irregular heart rhythm (arrhythmia). During atrial fibrillation, the upper chambers of the heart (atria) quiver continuously in a chaotic pattern. This causes an irregular and often rapid heart rate.  Atrial fibrillation is the result of the heart becoming overloaded with disorganized signals that tell it to beat. These signals are normally released one at a time by a part of the right atrium called the sinoatrial node. They then travel from the atria to the lower chambers of the heart (ventricles), causing the atria and ventricles to contract and pump blood as they pass. In atrial fibrillation, parts of the atria outside of the sinoatrial node also release these signals. This results in two problems. First, the atria receive so many signals that they do not have time to fully contract. Second, the ventricles, which can only receive one signal at a time, beat irregularly and out of rhythm with the atria.  There are three types of atrial fibrillation:   Paroxysmal Paroxysmal atrial fibrillation starts suddenly and stops on its own within a week.   Persistent Persistent atrial fibrillation lasts for more than a week. It may stop on its own or with treatment.   Permanent Permanent atrial fibrillation does not go away. Episodes of atrial fibrillation may lead to permanent atrial fibrillation.  Atrial fibrillation can prevent your heart from pumping blood normally. It increases your risk of stroke and can lead to heart failure.  CAUSES   Heart conditions, including a heart attack, heart failure, coronary artery disease, and heart valve conditions.   Inflammation of the sac that surrounds the heart (pericarditis).   Blockage of an artery in the lungs (pulmonary embolism).   Pneumonia or other infections.   Chronic lung disease.   Thyroid problems, especially if the thyroid is overactive (hyperthyroidism).   Caffeine, excessive alcohol  use, and use of some illegal drugs.   Use of some medications, including certain decongestants and diet pills.   Heart surgery.   Birth defects.  Sometimes, no cause can be found. When this happens, the atrial fibrillation is called lone atrial fibrillation. The risk of complications from atrial fibrillation increases if you have lone atrial fibrillation and you are age 78 years or older. RISK FACTORS  Heart failure.  Coronary artery disease  Diabetes mellitus.   High blood pressure (hypertension).   Obesity.   Other arrhythmias.   Increased age. SYMPTOMS   A feeling that your heart is beating rapidly or irregularly.   A feeling of discomfort or pain in your chest.   Shortness of breath.   Sudden lightheadedness or weakness.   Getting tired easily when exercising.   Urinating more often than normal (mainly when atrial fibrillation first begins).  In paroxysmal atrial fibrillation, symptoms may start and suddenly stop. DIAGNOSIS  Your caregiver may be able to detect atrial fibrillation when taking your pulse. Usually, testing is needed to diagnosis atrial fibrillation. Tests may include:   Electrocardiography. During this test, the electrical impulses of your heart are recorded while you are lying down.   Echocardiography. During echocardiography, sound waves are used to evaluate how blood flows through your heart.   Stress test. There is more than one type of stress test. If a stress test is needed, ask your caregiver about which type is best for you.   Chest X-ray exam.   Blood tests.   Computed tomography (CT).  TREATMENT   Treating any underlying conditions. For example, if you have an overactive  thyroid, treating the condition may correct atrial fibrillation.   Medication. Medications may be given to control a rapid heart rate or to prevent blood clots, heart failure, or a stroke.   Procedure to correct the rhythm of the  heart:  Electrical cardioversion. During electrical cardioversion, a controlled, low-energy shock is delivered to the heart through your skin. If you have chest pain, very low pressure blood pressure, or sudden heart failure, this procedure may need to be done as an emergency.  Catheter ablation. During this procedure, heart tissues that send the signals that cause atrial fibrillation are destroyed.  Maze or minimaze procedure. During this surgery, thin lines of heart tissue that carry the abnormal signals are destroyed. The maze procedure is an open-heart surgery. The minimaze procedure is a minimally invasive surgery. This means that small cuts are made to access the heart instead of a large opening.  Pulmonary venous isolation. During this surgery, tissue around the veins that carry blood from the lungs (pulmonary veins) is destroyed. This tissue is thought to carry the abnormal signals. HOME CARE INSTRUCTIONS   Take medications as directed by your caregiver.  Only take medications that your caregiver approves. Some medications can make atrial fibrillation worse or recur.  If blood thinners were prescribed by your caregiver, take them exactly as directed. Too much can cause bleeding. Too little and you will not have the needed protection against stroke and other problems.  Perform blood tests at home if directed by your caregiver.  Perform blood tests exactly as directed.   Quit smoking if you smoke.   Do not drink alcohol.   Do not drink caffeinated beverages such as coffee, soda, and some teas. You may drink decaffeinated coffee, soda, or tea.   Maintain a healthy weight. Do not use diet pills unless your caregiver approves. They may make heart problems worse.   Follow diet instructions as directed by your caregiver.   Exercise regularly as directed by your caregiver.   Keep all follow-up appointments. PREVENTION  The following substances can cause atrial fibrillation  to recur:   Caffeinated beverages.   Alcohol.   Certain medications, especially those used for breathing problems.   Certain herbs and herbal medications, such as those containing ephedra or ginseng.  Illegal drugs such as cocaine and amphetamines. Sometimes medications are given to prevent atrial fibrillation from recurring. Proper treatment of any underlying condition is also important in helping prevent recurrence.  SEEK MEDICAL CARE IF:  You notice a change in the rate, rhythm, or strength of your heartbeat.   You suddenly begin urinating more frequently.   You tire more easily when exerting yourself or exercising.  SEEK IMMEDIATE MEDICAL CARE IF:   You develop chest pain, abdominal pain, sweating, or weakness.  You feel sick to your stomach (nauseous).  You develop shortness of breath.  You suddenly develop swollen feet and ankles.  You feel dizzy.  You face or limbs feel numb or weak.  There is a change in your vision or speech. MAKE SURE YOU:   Understand these instructions.  Will watch your condition.  Will get help right away if you are not doing well or get worse. Document Released: 04/24/2005 Document Revised: 08/19/2012 Document Reviewed: 06/04/2012 [facility] Patient Information 2014 [place].  Near-Syncope Near-syncope (commonly known as near fainting) is sudden weakness, dizziness, or feeling like you might pass out. During an episode of near-syncope, you may also develop pale skin, have tunnel vision, or feel sick to your  stomach (nauseous). Near-syncope may occur when getting up after sitting or while standing for a long time. It is caused by a sudden decrease in blood flow to the brain. This decrease can result from various causes or triggers, most of which are not serious. However, because near-syncope can sometimes be a sign of something serious, a medical evaluation is required. The specific cause is often not determined. HOME CARE  INSTRUCTIONS  Monitor your condition for any changes. The following actions may help to alleviate any discomfort you are experiencing:  Have someone stay with you until you feel stable.  Lie down right away if you start feeling like you might faint. Breathe deeply and steadily. Wait until all the symptoms have passed. Most of these episodes last only a few minutes. You may feel tired for several hours.   Drink enough fluids to keep your urine clear or pale yellow.   If you are taking blood pressure or heart medicine, get up slowly when seated or lying down. Take several minutes to sit and then stand. This can reduce dizziness.  Follow up with your health care provider as directed. SEEK IMMEDIATE MEDICAL CARE IF:   You have a severe headache.   You have unusual pain in the chest, abdomen, or back.   You are bleeding from the mouth or rectum, or you have black or tarry stool.   You have an irregular or very fast heartbeat.   You have repeated fainting or have seizure-like jerking during an episode.   You faint when sitting or lying down.   You have confusion.   You have difficulty walking.   You have severe weakness.   You have vision problems.  MAKE SURE YOU:   Understand these instructions.  Will watch your condition.  Will get help right away if you are not doing well or get worse. Document Released: 04/24/2005 Document Revised: 12/25/2012 Document Reviewed: 09/27/2012 [facility] Patient Information 2014 [place].  Pneumonia, Adult Pneumonia is an infection of the lungs.  CAUSES Pneumonia may be caused by bacteria or a virus. Usually, these infections are caused by breathing infectious particles into the lungs (respiratory tract). SYMPTOMS   Cough.  Fever.  Chest pain.  Increased rate of breathing.  Wheezing.  Mucus production. DIAGNOSIS  If you have the common symptoms of pneumonia, your caregiver will typically confirm the diagnosis  with a chest X-ray. The X-ray will show an abnormality in the lung (pulmonary infiltrate) if you have pneumonia. Other tests of your blood, urine, or sputum may be done to find the specific cause of your pneumonia. Your caregiver may also do tests (blood gases or pulse oximetry) to see how well your lungs are working. TREATMENT  Some forms of pneumonia may be spread to other people when you cough or sneeze. You may be asked to wear a mask before and during your exam. Pneumonia that is caused by bacteria is treated with antibiotic medicine. Pneumonia that is caused by the influenza virus may be treated with an antiviral medicine. Most other viral infections must run their course. These infections will not respond to antibiotics.  PREVENTION A pneumococcal shot (vaccine) is available to prevent a common bacterial cause of pneumonia. This is usually suggested for:  People over 60 years old.  Patients on chemotherapy.  People with chronic lung problems, such as bronchitis or emphysema.  People with immune system problems. If you are over 65 or have a high risk condition, you may receive the pneumococcal vaccine  if you have not received it before. In some countries, a routine influenza vaccine is also recommended. This vaccine can help prevent some cases of pneumonia.You may be offered the influenza vaccine as part of your care. If you smoke, it is time to quit. You may receive instructions on how to stop smoking. Your caregiver can provide medicines and counseling to help you quit. HOME CARE INSTRUCTIONS   Cough suppressants may be used if you are losing too much rest. However, coughing protects you by clearing your lungs. You should avoid using cough suppressants if you can.  Your caregiver may have prescribed medicine if he or she thinks your pneumonia is caused by a bacteria or influenza. Finish your medicine even if you start to feel better.  Your caregiver may also prescribe an expectorant.  This loosens the mucus to be coughed up.  Only take over-the-counter or prescription medicines for pain, discomfort, or fever as directed by your caregiver.  Do not smoke. Smoking is a common cause of bronchitis and can contribute to pneumonia. If you are a smoker and continue to smoke, your cough may last several weeks after your pneumonia has cleared.  A cold steam vaporizer or humidifier in your room or home may help loosen mucus.  Coughing is often worse at night. Sleeping in a semi-upright position in a recliner or using a couple pillows under your head will help with this.  Get rest as you feel it is needed. Your body will usually let you know when you need to rest. SEEK IMMEDIATE MEDICAL CARE IF:   Your illness becomes worse. This is especially true if you are elderly or weakened from any other disease.  You cannot control your cough with suppressants and are losing sleep.  You begin coughing up blood.  You develop pain which is getting worse or is uncontrolled with medicines.  You have a fever.  Any of the symptoms which initially brought you in for treatment are getting worse rather than better.  You develop shortness of breath or chest pain. MAKE SURE YOU:   Understand these instructions.  Will watch your condition.  Will get help right away if you are not doing well or get worse. Document Released: 04/24/2005 Document Revised: 07/17/2011 Document Reviewed: 07/14/2010 [facility] Patient Information 2014 [place], [place].

## 2013-06-07 NOTE — ED Provider Notes (Signed)
CSN: [id number]     Arrival date & time 06/07/13  1137 History   First MD Initiated Contact with Patient 06/07/13 1147     Chief Complaint  Patient presents with  . Near Syncope  . Shortness of Breath   (Consider location/radiation/quality/duration/timing/severity/associated sxs/prior Treatment) HPI Comments: Pt recently had a cardiac cath and pacemaker placed a couple of weeks ago, was seen last week due to bruising and bleeding at cath site in right groin.  Pt is on xarelto recently as well.  Per EMS, BP was >474 systolic, stroke screen neg, no sig complaints and pt admits to feeling improved once arrived.  Prior records indicate pt had complete heart block necessitating the pacer.    Patient is a 80 y.o. female presenting with near-syncope and shortness of breath. The history is provided by the patient and medical records.  Near Syncope This is a new problem. The current episode started 1 to 2 hours ago. The problem occurs constantly. The problem has been gradually improving. Associated symptoms include shortness of breath. Pertinent negatives include no chest pain and no headaches.  Shortness of Breath Associated symptoms: no chest pain, no cough, no fever, no headaches and no vomiting     Past Medical History  Diagnosis Date  . Arthritis   . Allergy   . Hypertension   . Colon polyps   . Heart block AV second degree     s/p MDT Addapta dual chamber pacemaker 05/2013 by Dr Rayann Heman  . Atrial fibrillation    Past Surgical History  Procedure Laterality Date  . Breast surgery  1959    cyst removal  . Bunionectomy  1967    both feet  . Pacemaker insertion  05/27/2013    MDT ADDRL1 implanted by Dr Rayann Heman for heart block   Family History  Problem Relation Age of Onset  . Cancer Father     lung   History  Substance Use Topics  . Smoking status: Never Smoker   . Smokeless tobacco: Not on file  . Alcohol Use: Yes     Comment: daily   OB History   Grav Para Term Preterm  Abortions TAB SAB Ect Mult Living                 Review of Systems  Constitutional: Negative for fever and chills.  Respiratory: Positive for shortness of breath. Negative for cough and chest tightness.   Cardiovascular: Positive for near-syncope. Negative for chest pain.  Gastrointestinal: Positive for nausea. Negative for vomiting.  Musculoskeletal: Negative for back pain.  Neurological: Positive for dizziness and light-headedness. Negative for syncope and headaches.  All other systems reviewed and are negative.    Allergies  Codeine and Morphine and related  Home Medications   Current Outpatient Rx  Name  Route  Sig  Dispense  Refill  . acetaminophen (TYLENOL) 325 MG tablet   Oral   Take 325 mg by mouth every 6 (six) hours as needed for mild pain.         Marland Kitchen ALPRAZolam (XANAX) 0.25 MG tablet   Oral   Take 1 tablet (0.25 mg total) by mouth 2 (two) times daily as needed for anxiety.   60 tablet   0   . amLODipine (NORVASC) 5 MG tablet   Oral   Take 1 tablet (5 mg total) by mouth daily.   30 tablet   1   . ferrous sulfate 325 (65 FE) MG tablet   Oral   Take  1 tablet (325 mg total) by mouth daily with breakfast.   30 tablet   3   . losartan (COZAAR) 100 MG tablet   Oral   Take 1 tablet (100 mg total) by mouth daily.   30 tablet   11   . metoprolol tartrate (LOPRESSOR) 25 MG tablet   Oral   Take 1 tablet (25 mg total) by mouth 2 (two) times daily.   30 tablet   1   . Rivaroxaban (XARELTO) 20 MG TABS tablet   Oral   Take 1 tablet (20 mg total) by mouth daily with supper.   30 tablet   11   . esomeprazole (NEXIUM) 40 MG capsule   Oral   Take 40 mg by mouth daily at 12 noon.          BP 126/60  Pulse 64  Temp(Src) 99 F (37.2 C) (Oral)  Resp 20  SpO2 99% Physical Exam  Nursing note and vitals reviewed. Constitutional: She appears well-developed and well-nourished. No distress.  HENT:  Head: Normocephalic and atraumatic.  Eyes: Conjunctivae  and EOM are normal. No scleral icterus.  Neck: Normal range of motion. Neck supple. No JVD present.  Cardiovascular: Normal rate, regular rhythm and intact distal pulses.   Pulmonary/Chest: Effort normal. No respiratory distress. She has no wheezes.  Abdominal: Soft. There is no tenderness. There is no rebound.  Musculoskeletal: She exhibits no edema.  Neurological: She is alert.  No arm drift, no facial droop, 5/5 strength in B UE and B LE  Skin: Skin is warm. No rash noted. No pallor.  Psychiatric: She has a normal mood and affect. Her mood appears not anxious.    ED Course  Procedures (including critical care time) Labs Review Labs Reviewed  CBC WITH DIFFERENTIAL - Abnormal; Notable for the following:    WBC 11.7 (*)    RBC 2.98 (*)    Hemoglobin 9.8 (*)    HCT 29.0 (*)    Platelets 462 (*)    Neutrophils Relative % 84 (*)    Neutro Abs 9.9 (*)    Lymphocytes Relative 7 (*)    All other components within normal limits  BASIC METABOLIC PANEL - Abnormal; Notable for the following:    Sodium 134 (*)    Glucose, Bld 123 (*)    GFR calc non Af Amer 78 (*)    All other components within normal limits  APTT  PROTIME-INR  TROPONIN I  PRO B NATRIURETIC PEPTIDE   Imaging Review Dg Chest Port 1 View  06/07/2013   CLINICAL DATA:  Shortness of breath, dizziness, history hypertension  EXAM: PORTABLE CHEST - 1 VIEW  COMPARISON:  Portable exam 1254 hr compared to 05/28/2013  FINDINGS: Left subclavian transvenous pacemaker leads project over right atrium and right ventricle.  Enlargement of cardiac silhouette.  Mediastinal contours and pulmonary vascularity normal.  Minimal bronchitic changes.  New opacity at the left lung base likely represents a small left pleural effusion with mild atelectasis versus consolidation in left lower lobe, with interval loss of the left diaphragmatic silhouette.  Remaining lungs clear.  No pneumothorax.  Bones demineralized.  IMPRESSION:  Enlargement of cardiac  silhouette post pacemaker. Small left pleural effusion with question atelectasis versus consolidation in left lower lobe new since previous exam.   Electronically Signed   By: Lavonia Dana M.D.   On: 06/07/2013 13:16    EKG Interpretation    Date/Time:  Saturday June 07 2013 11:50:41 EST Ventricular Rate:  60 PR Interval:  62 QRS Duration: 136 QT Interval:  431 QTC Calculation: 431 R Axis:   -50 Text Interpretation:  probable ventricular pacing possible underlying atrial fibrillation Left bundle branch block Abnormal ekg Confirmed by [facility]  MD, MICHEAL ([id number]) on 06/07/2013 12:26:46 PM          RA sat is 98% and I interpret to be normal  2:49 PM With ambulation, pt's O2 sats dipped to 88%, fatigued, but no dizzy, light headed or feelgin faint.  [employer] interpretation of pacer shows pt has been in atrial fib or other atrial tachycardia since 1/29.  Per family and after review of [facility] notes, pt was seen in clinic on the 29th and found to be in atrial fib.  Pt was started on xarelto at that point.  Given EF is already in the 30's, possibly with atrial fib ongoing for past 2 days, pt is developng symptoms from it.  CXR shows no overt edema, but considering O2 sats dropped and pt has had complaints of SOB at home, pulmonary edema and CHF exacerbation may be culprit.  Will consult cardiology.  BNP was asked to be added on, but will need to be redrawn.  Pt's oral temp is 99, WBC is slightly up at 11.  May consider pneumonia following a surgical procedure as well, although at this point I think less likely given the underlying atrial fibrillation.    4:14 PM Pt has insisted to me several times that she refuses to be admitted and does not wish to stay. I have reiterated that I am awaiting call back from cardiology to come up with plans regarding this new atrial fibrillation.  Ultimately, pt I think is safe to be discharged and cal follow up with Dr. Doylene Canard and Dr. Rayann Heman early next week.    MDM    1. CHF (congestive heart failure)   2. Atrial fibrillation       Pt is well appearing, non focal neuro exam here.  Pt denies any CP at present, feels improved without any specific intervention.  plan to obtain ECG, routine labs, check orthostatics and interrogate pacemaker.       Saddie Benders. Dorna Mai, MD 06/10/13 ZP:[id number]

## 2013-06-07 NOTE — Consult Note (Signed)
Patient ID: Theresa Norton MRN: [id number], DOB/AGE: [birth year]   Admit date: 06/07/2013   Primary Physician: Eulas Post, MD Primary Cardiologist: Jeanne Ivan / EP - J. Allred, MD   Pt. Profile:  80 y/o female s/p recent PPM and dx of paf, who presented to the ED today with presyncope and dyspnea.  Problem List  Past Medical History  Diagnosis Date  . Arthritis   . Allergy   . Hypertension   . Colon polyps   . Heart block AV second degree     a. s/p MDT Addapta dual chamber pacemaker 05/2013 by Dr Rayann Heman.  Marland Kitchen PAF (paroxysmal atrial fibrillation)     a. Found post-pacer 05/2013->Xarelto added;  b. 05/2013 Echo: EF 60-65%, mild LVH, nl wall motion w/o rwma.  . Groin hematoma     a. 05/2013 R groin hematoma post-cath - u/s 05/31/13 large 7.49 cm r inguinal hematoma extending into thigh, no psa or avf.  . Anemia   . CAD (coronary artery disease)     a. 05/2013 nonobs dzs by cath.    Past Surgical History  Procedure Laterality Date  . Breast surgery  1959    cyst removal  . Bunionectomy  1967    both feet  . Pacemaker insertion  05/27/2013    MDT ADDRL1 implanted by Dr Rayann Heman for heart block    Allergies  Allergies  Allergen Reactions  . Codeine     GI upset  . Morphine And Related     GI upset   HPI  80 y/o female with the above problem list.  She was admitted 1/19 with symptomatic bradycardia and high grade heart block.  Cath showed nonobs CAD.  EP was consulted and she underwent MDT PPM placement.  Post-cath/pacer course was complicated by a large right groin hematoma with drop in H/H to 9.9/29.4.  She was discharged home on 1/22.  Following d/c she had significant bruising along her right groin/flank/thigh and was seen in the ED again on 1/24 where a R groin u/s was performed and did not show any active bleeding/AVF/PSA.  H/H was stable, though remained low and she was d/c'd from the ED.  She was seen in device clinic at our office on 1/29, and upon interrogation  of her device it was noted that she was having paroxysms of afib.  She was seen by Dr. Lovena Le and started on Xarelto 20mg  qpm.  Today, after awakening, she walked to her bathroom and while standing in front of the sink, she felt very lightheaded, as though she might lose consciousness.  This abated while standing and she walked toward her kitchen.  While doing so, she began to feel sob and lightheaded again.  Her dtr got her a chair to sit on and called EMS. While sitting, the pt was somewhat diaphoretic but slowly began to feel better.  She denied c/p and never lost consciousness.  Upon EMS arrival, she was apparently found to have a low BP (low 100's per family).  She was taken into the [facility] ED where workup has been relatively unremarkable.  H/H, though still low, is stable.  BP dropped from 140/56 to 109/68 when moving from lying to seated position (after 3 mins according to records).  She has a low-grade fever and WBC is mildly elevated @ 11.7.  CXR shows new LLL atx vs consolidation.  She does report recent development of nonproductive cough but otw has been feeling well.  O2 sats have been  wnl, though family reports that [facility] found sats in the 80's yesterday in the absence of Ss.  Home Medications  Prior to Admission medications   Medication Sig Start Date End Date Taking? Authorizing Provider  acetaminophen (TYLENOL) 325 MG tablet Take 325 mg by mouth every 6 (six) hours as needed for mild pain.   Yes Historical Provider, MD  ALPRAZolam (XANAX) 0.25 MG tablet Take 1 tablet (0.25 mg total) by mouth 2 (two) times daily as needed for anxiety. 05/29/13  Yes Birdie Riddle, MD  amLODipine (NORVASC) 5 MG tablet Take 1 tablet (5 mg total) by mouth daily. 05/29/13  Yes Birdie Riddle, MD  ferrous sulfate 325 (65 FE) MG tablet Take 1 tablet (325 mg total) by mouth daily with breakfast. 05/30/13  Yes Birdie Riddle, MD  losartan (COZAAR) 100 MG tablet Take 1 tablet (100 mg total) by mouth daily. 01/20/13  Yes  Eulas Post, MD  metoprolol tartrate (LOPRESSOR) 25 MG tablet Take 1 tablet (25 mg total) by mouth 2 (two) times daily. 05/29/13  Yes Birdie Riddle, MD  Rivaroxaban (XARELTO) 20 MG TABS tablet Take 1 tablet (20 mg total) by mouth daily with supper. 06/05/13  Yes Evans Lance, MD  esomeprazole (NEXIUM) 40 MG capsule Take 40 mg by mouth daily at 12 noon.    Historical Provider, MD   Family History  Family History  Problem Relation Age of Onset  . Cancer Father     lung   Social History  History   Social History  . Marital Status: Married    Spouse Name: N/A    Number of Children: N/A  . Years of Education: N/A   Occupational History  . Not on file.   Social History Main Topics  . Smoking status: Never Smoker   . Smokeless tobacco: Not on file  . Alcohol Use: Yes     Comment: daily  . Drug Use: No  . Sexual Activity: Not on file   Other Topics Concern  . Not on file   Social History Narrative  . No narrative on file     Review of Systems General:  No chills, fever, night sweats or weight changes.  Cardiovascular:  No chest pain, +++ presyncope and dyspnea on exertion today while walking at home, no edema, orthopnea, palpitations, paroxysmal nocturnal dyspnea. Dermatological: No rash, lesions/masses Respiratory: +++ intermittent, nonproductive cough, dyspnea as above. Urologic: No hematuria, dysuria Abdominal:   No nausea, vomiting, diarrhea, bright red blood per rectum, melena, or hematemesis.  R flank ecchymosis. Ext: r groin hematoma. Neurologic:  No visual changes, wkns, changes in mental status. All other systems reviewed and are otherwise negative except as noted above.  Physical Exam   Blood pressure 126/60, pulse 64, temperature 99 F (37.2 C), temperature source Oral, resp. rate 20, SpO2 99.00%.  General: Pleasant, NAD Psych: Normal affect. Neuro: Alert and oriented X 3. Moves all extremities spontaneously. HEENT: Normal  Neck: Supple without  bruits or JVD. Lungs:  Resp regular and unlabored, diminished @ bases. Heart: RRR no s3, s4, 2/6 syst m loudest lusb. Abdomen: Soft, non-tender, non-distended, BS + x 4. Ecchymosis noted to r flank - soft. Extremities: R groin with large/firm hematoma w/o active bleeding/bruit.  No clubbing, cyanosis or edema. DP/PT/Radials 2+ and equal bilaterally.  Labs   Recent Labs  06/07/13 1502  TROPONINI <0.30   Lab Results  Component Value Date   WBC 11.7* 06/07/2013   HGB 9.8* 06/07/2013  HCT 29.0* 06/07/2013   MCV 97.3 06/07/2013   PLT 462* 06/07/2013     Recent Labs Lab 06/07/13 1226  NA 134*  K 4.6  CL 97  CO2 20  BUN 11  CREATININE 0.71  CALCIUM 8.9  GLUCOSE 123*   Radiology/Studies  Dg Chest Port 1 View  06/07/2013   CLINICAL DATA:  Shortness of breath, dizziness, history hypertension  EXAM: PORTABLE CHEST - 1 VIEW   IMPRESSION:  Enlargement of cardiac silhouette post pacemaker. Small left pleural effusion with question atelectasis versus consolidation in left lower lobe new since previous exam.   Electronically Signed   By: Lavonia Dana M.D.   On: 06/07/2013 13:16   ECG  V paced, 60, underlying afib  ASSESSMENT AND PLAN  1.  Probably orthostatic hypotension:  Pt presented with presyncope while standing today.  She was noted to drop her BP significantly when moving from lying to seated position here in the ED.  She is currently asymptomatic and VS are stable.  H/H is stable.  She has been able to ambulate w/o recurrent Ss.  Prior to her admission a few wks ago, she had only ever been on losartan therapy and now she is also on amlodipine 5 qd and lopressor 25 bid.  Rec discontinuing amlodipine and titrating lopressor to 50mg  bid, esp in light of AF with rates > 100 ~ 30+% of the time.  The patient does not wish to stay in the hospital for observation.  We have recommended early f/u with primary care and or her primary cardiologist, Dr. Doylene Canard.  2.  Afib:  In afib today, HR  currently 60 - appears to be paced with underlying afib.  Recently started on xarelto (CHA2DS2VASc = 4).  H/H stable.  Titrating BB as above as interrogation of device does show that she spends 30+% of her time with HR's > 100.  She had nl EF on echo this month.  3.  R groin hematoma:  H/H stable as above.  Nl u/s on 1/24.  No bruit.    4.  Low-grade fever: with mild elev of WBC and new LLL finding on cxr.  Defer mgmt to ED staff.  She does report a recent cough and dyspnea.  Signed, Murray Hodgkins, NP 06/07/2013, 5:17 PM  Patient seen and examined independently. Emeline Gins, NP note reviewed carefully - agree with his assessment and plan. I have edited the note based on my findings. I reviewed pacer interrogation personally.   Patient with newly discovered PAF about 17% of time. HR relatively well controlled though about 10% of V-rates > 120. Suspect this may be contributing to her symptoms. I suspect low BP may also be a factor as well. Will stop amlodipine and increase lopressor to 50 bid. I explained to her and her son that Dr. Doylene Canard is her primary cardiologist and they will need to f/u with him for further recommendations. (They were unwilling to wait in ER any longer). Will continue Xarelto.   Patient also has a mild leukocytosis with ? LLL infiltrate on CXR which will be addressed be ER physician. Would have low threshold to start abx.   Daniel Bensimhon,MD 5:50 PM

## 2013-06-07 NOTE — ED Provider Notes (Addendum)
5:43 PM  Pt with history of hypertension, recent complete heart block status post pacemaker 2 weeks ago who presented to the emergency room with shortness of breath and near syncope. Patient recently had her pacemaker interrogated which showed intermittent episodes of atrial fibrillation. She was started on Xarelto but no antiarrhythmics the Patient's workup has been unremarkable except for a mild leukocytosis and possible left lower lobe consolidation and left pleural effusion. She's had temperature of 99.3 orally. She does endorse to me that she has had nonproductive cough for 2 days Patient has been seen by Dr. Sung Amabile with cardiology who feels she is safe to be discharged home with outpatient follow up.patient has been offered admission several time but she refuses. We'll discharge home with antibiotics for possible community acquired pneumonia given she is endorsing cough. Given strict return precautions. Patient has no hypoxia or respiratory distress. Patient and son at bedside verbalize understanding and are comfortable with this plan.   [place], DO 06/07/13 [place], DO 06/07/13 1746

## 2013-06-08 HISTORY — PX: INSERT / REPLACE / REMOVE PACEMAKER: SUR710

## 2013-06-09 ENCOUNTER — Encounter: Payer: Self-pay | Admitting: *Deleted

## 2013-06-09 ENCOUNTER — Telehealth: Payer: Self-pay | Admitting: *Deleted

## 2013-06-09 DIAGNOSIS — I10 Essential (primary) hypertension: Secondary | ICD-10-CM | POA: Diagnosis not present

## 2013-06-09 NOTE — Telephone Encounter (Signed)
PA sent to [facility] for xarelto 

## 2013-06-10 DIAGNOSIS — Z48812 Encounter for surgical aftercare following surgery on the circulatory system: Secondary | ICD-10-CM | POA: Diagnosis not present

## 2013-06-10 DIAGNOSIS — D509 Iron deficiency anemia, unspecified: Secondary | ICD-10-CM | POA: Diagnosis not present

## 2013-06-10 DIAGNOSIS — I442 Atrioventricular block, complete: Secondary | ICD-10-CM | POA: Diagnosis not present

## 2013-06-10 DIAGNOSIS — Z7901 Long term (current) use of anticoagulants: Secondary | ICD-10-CM | POA: Diagnosis not present

## 2013-06-10 DIAGNOSIS — Z45018 Encounter for adjustment and management of other part of cardiac pacemaker: Secondary | ICD-10-CM | POA: Diagnosis not present

## 2013-06-10 DIAGNOSIS — I10 Essential (primary) hypertension: Secondary | ICD-10-CM | POA: Diagnosis not present

## 2013-06-11 ENCOUNTER — Encounter (HOSPITAL_COMMUNITY): Payer: Self-pay | Admitting: Emergency Medicine

## 2013-06-11 ENCOUNTER — Encounter (HOSPITAL_COMMUNITY)
Admission: EM | Disposition: A | Discharge status: Home Health | Payer: Self-pay | Source: Home / Self Care | Attending: Cardiovascular Disease

## 2013-06-11 ENCOUNTER — Inpatient Hospital Stay (HOSPITAL_COMMUNITY)
Admission: EM | Admit: 2013-06-11 | Discharge: 2013-06-20 | DRG: 260 | Disposition: A | Discharge status: Home Health | Payer: Medicare Other | Attending: Cardiovascular Disease | Admitting: Cardiovascular Disease

## 2013-06-11 ENCOUNTER — Telehealth: Payer: Self-pay | Admitting: Family Medicine

## 2013-06-11 ENCOUNTER — Emergency Department (HOSPITAL_COMMUNITY): Payer: Medicare Other

## 2013-06-11 DIAGNOSIS — D509 Iron deficiency anemia, unspecified: Secondary | ICD-10-CM | POA: Diagnosis not present

## 2013-06-11 DIAGNOSIS — I314 Cardiac tamponade: Secondary | ICD-10-CM | POA: Diagnosis not present

## 2013-06-11 DIAGNOSIS — I10 Essential (primary) hypertension: Secondary | ICD-10-CM | POA: Diagnosis present

## 2013-06-11 DIAGNOSIS — E871 Hypo-osmolality and hyponatremia: Secondary | ICD-10-CM | POA: Diagnosis not present

## 2013-06-11 DIAGNOSIS — I4892 Unspecified atrial flutter: Secondary | ICD-10-CM | POA: Diagnosis not present

## 2013-06-11 DIAGNOSIS — R899 Unspecified abnormal finding in specimens from other organs, systems and tissues: Secondary | ICD-10-CM | POA: Diagnosis not present

## 2013-06-11 DIAGNOSIS — I4891 Unspecified atrial fibrillation: Secondary | ICD-10-CM | POA: Diagnosis not present

## 2013-06-11 DIAGNOSIS — Y92009 Unspecified place in unspecified non-institutional (private) residence as the place of occurrence of the external cause: Secondary | ICD-10-CM

## 2013-06-11 DIAGNOSIS — Z801 Family history of malignant neoplasm of trachea, bronchus and lung: Secondary | ICD-10-CM | POA: Diagnosis not present

## 2013-06-11 DIAGNOSIS — T82120A Displacement of cardiac electrode, initial encounter: Secondary | ICD-10-CM

## 2013-06-11 DIAGNOSIS — T82190A Other mechanical complication of cardiac electrode, initial encounter: Principal | ICD-10-CM | POA: Diagnosis present

## 2013-06-11 DIAGNOSIS — Y831 Surgical operation with implant of artificial internal device as the cause of abnormal reaction of the patient, or of later complication, without mention of misadventure at the time of the procedure: Secondary | ICD-10-CM | POA: Diagnosis present

## 2013-06-11 DIAGNOSIS — R071 Chest pain on breathing: Secondary | ICD-10-CM | POA: Diagnosis not present

## 2013-06-11 DIAGNOSIS — I319 Disease of pericardium, unspecified: Secondary | ICD-10-CM | POA: Diagnosis present

## 2013-06-11 DIAGNOSIS — I959 Hypotension, unspecified: Secondary | ICD-10-CM | POA: Diagnosis present

## 2013-06-11 DIAGNOSIS — F411 Generalized anxiety disorder: Secondary | ICD-10-CM | POA: Diagnosis present

## 2013-06-11 DIAGNOSIS — IMO0001 Reserved for inherently not codable concepts without codable children: Secondary | ICD-10-CM

## 2013-06-11 DIAGNOSIS — D500 Iron deficiency anemia secondary to blood loss (chronic): Secondary | ICD-10-CM | POA: Diagnosis present

## 2013-06-11 DIAGNOSIS — I442 Atrioventricular block, complete: Secondary | ICD-10-CM | POA: Diagnosis present

## 2013-06-11 DIAGNOSIS — R079 Chest pain, unspecified: Secondary | ICD-10-CM | POA: Diagnosis not present

## 2013-06-11 DIAGNOSIS — J189 Pneumonia, unspecified organism: Secondary | ICD-10-CM | POA: Diagnosis not present

## 2013-06-11 DIAGNOSIS — I3139 Other pericardial effusion (noninflammatory): Secondary | ICD-10-CM | POA: Diagnosis present

## 2013-06-11 DIAGNOSIS — I2510 Atherosclerotic heart disease of native coronary artery without angina pectoris: Secondary | ICD-10-CM | POA: Diagnosis not present

## 2013-06-11 DIAGNOSIS — J158 Pneumonia due to other specified bacteria: Secondary | ICD-10-CM | POA: Diagnosis not present

## 2013-06-11 DIAGNOSIS — R030 Elevated blood-pressure reading, without diagnosis of hypertension: Secondary | ICD-10-CM

## 2013-06-11 DIAGNOSIS — I312 Hemopericardium, not elsewhere classified: Secondary | ICD-10-CM | POA: Diagnosis not present

## 2013-06-11 DIAGNOSIS — J9819 Other pulmonary collapse: Secondary | ICD-10-CM | POA: Diagnosis not present

## 2013-06-11 DIAGNOSIS — Z885 Allergy status to narcotic agent status: Secondary | ICD-10-CM

## 2013-06-11 DIAGNOSIS — Z950 Presence of cardiac pacemaker: Secondary | ICD-10-CM | POA: Diagnosis not present

## 2013-06-11 DIAGNOSIS — D649 Anemia, unspecified: Secondary | ICD-10-CM | POA: Diagnosis not present

## 2013-06-11 DIAGNOSIS — Z930 Tracheostomy status: Secondary | ICD-10-CM | POA: Diagnosis not present

## 2013-06-11 DIAGNOSIS — I313 Pericardial effusion (noninflammatory): Secondary | ICD-10-CM

## 2013-06-11 DIAGNOSIS — J90 Pleural effusion, not elsewhere classified: Secondary | ICD-10-CM | POA: Diagnosis not present

## 2013-06-11 HISTORY — PX: PERICARDIAL TAP: SHX5486

## 2013-06-11 LAB — LACTATE DEHYDROGENASE, PLEURAL OR PERITONEAL FLUID: LD, Fluid: 1253 U/L — ABNORMAL HIGH (ref 3–23)

## 2013-06-11 LAB — BODY FLUID CELL COUNT WITH DIFFERENTIAL
Eos, Fluid: 0 %
Lymphs, Fluid: 54 %
Monocyte-Macrophage-Serous Fluid: 14 % — ABNORMAL LOW (ref 50–90)
Neutrophil Count, Fluid: 32 % — ABNORMAL HIGH (ref 0–25)
Total Nucleated Cell Count, Fluid: 4228 uL — ABNORMAL HIGH (ref 0–1000)

## 2013-06-11 LAB — CBC
HCT: 26.1 % — ABNORMAL LOW (ref 36.0–46.0)
Hemoglobin: 9.4 g/dL — ABNORMAL LOW (ref 12.0–15.0)
MCH: 33.9 pg (ref 26.0–34.0)
MCHC: 36 g/dL (ref 30.0–36.0)
MCV: 94.2 fL (ref 78.0–100.0)
Platelets: 426 10*3/uL — ABNORMAL HIGH (ref 150–400)
RBC: 2.77 MIL/uL — ABNORMAL LOW (ref 3.87–5.11)
RDW: 13.7 % (ref 11.5–15.5)
WBC: 10.3 10*3/uL (ref 4.0–10.5)

## 2013-06-11 LAB — BASIC METABOLIC PANEL
BUN: 33 mg/dL — ABNORMAL HIGH (ref 6–23)
CO2: 17 mEq/L — ABNORMAL LOW (ref 19–32)
Calcium: 8.7 mg/dL (ref 8.4–10.5)
Chloride: 90 mEq/L — ABNORMAL LOW (ref 96–112)
Creatinine, Ser: 2.1 mg/dL — ABNORMAL HIGH (ref 0.50–1.10)
GFR calc Af Amer: 24 mL/min — ABNORMAL LOW (ref >90)
GFR calc non Af Amer: 21 mL/min — ABNORMAL LOW (ref >90)
Glucose, Bld: 126 mg/dL — ABNORMAL HIGH (ref 70–99)
Potassium: 4.7 mEq/L (ref 3.7–5.3)
Sodium: 124 mEq/L — ABNORMAL LOW (ref 137–147)

## 2013-06-11 LAB — POCT I-STAT TROPONIN I: Troponin i, poc: 0 ng/mL (ref 0.00–0.08)

## 2013-06-11 LAB — GLUCOSE, SEROUS FLUID: Glucose, Fluid: 89 mg/dL

## 2013-06-11 LAB — PRO B NATRIURETIC PEPTIDE: Pro B Natriuretic peptide (BNP): 1594 pg/mL — ABNORMAL HIGH (ref 0–450)

## 2013-06-11 LAB — CREATININE, FLUID (PLEURAL, PERITONEAL, JP DRAINAGE): Creat, Fluid: 2 mg/dL

## 2013-06-11 SURGERY — PERICARDIAL TAP
Anesthesia: LOCAL

## 2013-06-11 MED ORDER — MIDAZOLAM HCL 2 MG/2ML IJ SOLN
INTRAMUSCULAR | Status: AC
  Filled 2013-06-11: qty 2

## 2013-06-11 MED ORDER — ACETAMINOPHEN 325 MG PO TABS
650.0000 mg | ORAL_TABLET | Freq: Four times a day (QID) | ORAL | Status: DC | PRN
  Administered 2013-06-11 – 2013-06-12 (×3): 650 mg via ORAL
  Filled 2013-06-11 (×3): qty 2

## 2013-06-11 MED ORDER — ASPIRIN 81 MG PO CHEW: 81.0000 mg | CHEWABLE_TABLET | ORAL | Status: DC

## 2013-06-11 MED ORDER — SODIUM CHLORIDE 0.9 % IV SOLN: 250.0000 mL | INTRAVENOUS | Status: DC | PRN

## 2013-06-11 MED ORDER — METOPROLOL TARTRATE 50 MG PO TABS
50.0000 mg | ORAL_TABLET | Freq: Two times a day (BID) | ORAL | Status: DC
  Administered 2013-06-11 – 2013-06-13 (×4): 50 mg via ORAL
  Filled 2013-06-11 (×5): qty 1

## 2013-06-11 MED ORDER — SODIUM CHLORIDE 0.9 % IJ SOLN: 3.0000 mL | INTRAMUSCULAR | Status: DC | PRN

## 2013-06-11 MED ORDER — MORPHINE SULFATE 2 MG/ML IJ SOLN
2.0000 mg | Freq: Once | INTRAMUSCULAR | Status: AC
  Administered 2013-06-11: 2 mg via INTRAVENOUS
  Filled 2013-06-11: qty 1

## 2013-06-11 MED ORDER — ONDANSETRON HCL 4 MG/2ML IJ SOLN
4.0000 mg | Freq: Once | INTRAMUSCULAR | Status: AC
  Administered 2013-06-11: 4 mg via INTRAVENOUS
  Filled 2013-06-11: qty 2

## 2013-06-11 MED ORDER — LOSARTAN POTASSIUM 50 MG PO TABS: 100.0000 mg | ORAL_TABLET | Freq: Every day | ORAL | Status: DC

## 2013-06-11 MED ORDER — COLCHICINE 0.6 MG PO TABS
0.6000 mg | ORAL_TABLET | Freq: Every day | ORAL | Status: DC
  Administered 2013-06-11 – 2013-06-19 (×9): 0.6 mg via ORAL
  Filled 2013-06-11 (×10): qty 1

## 2013-06-11 MED ORDER — HEPARIN SODIUM (PORCINE) 5000 UNIT/ML IJ SOLN
5000.0000 [IU] | Freq: Three times a day (TID) | INTRAMUSCULAR | Status: DC
  Filled 2013-06-11 (×2): qty 1

## 2013-06-11 MED ORDER — SODIUM CHLORIDE 0.9 % IJ SOLN
3.0000 mL | Freq: Two times a day (BID) | INTRAMUSCULAR | Status: DC
  Administered 2013-06-13: 3 mL via INTRAVENOUS

## 2013-06-11 MED ORDER — FENTANYL CITRATE 0.05 MG/ML IJ SOLN
INTRAMUSCULAR | Status: AC
  Filled 2013-06-11: qty 2

## 2013-06-11 MED ORDER — SODIUM CHLORIDE 0.9 % IJ SOLN
3.0000 mL | Freq: Two times a day (BID) | INTRAMUSCULAR | Status: DC
Start: 2013-06-11 — End: 2013-06-12
  Administered 2013-06-12 (×2): 3 mL via INTRAVENOUS

## 2013-06-11 MED ORDER — HEPARIN (PORCINE) IN NACL 2-0.9 UNIT/ML-% IJ SOLN
INTRAMUSCULAR | Status: AC
  Filled 2013-06-11: qty 500

## 2013-06-11 MED ORDER — ALPRAZOLAM 0.25 MG PO TABS
0.2500 mg | ORAL_TABLET | Freq: Two times a day (BID) | ORAL | Status: DC | PRN
  Administered 2013-06-11 – 2013-06-20 (×3): 0.25 mg via ORAL
  Filled 2013-06-11 (×4): qty 1

## 2013-06-11 NOTE — H&P (View-Only) (Signed)
Theresa Norton is an 80 y.o. female.   Chief Complaint: Weakness and low heart rate HPI:  80 year old female with past medical history of hypertension presents today with complaints of elevated blood pressure, weakness, fatigue, and low heart rate. This seemed to coincide with catching "the flu" around Christmas time. She has been feeling badly since then. She denies any chest pain. She does feel somewhat short of breath and weak. No B-blocker or calcium channel use.   Past Medical History  Diagnosis Date  . Arthritis   . Allergy   . Hypertension   . Colon polyps   . Heart block AV second degree 12/13    after initiating Amlodipine      Past Surgical History  Procedure Laterality Date  . Breast surgery  1959    cyst removal  . Bunionectomy  1967    both feet    Family History  Problem Relation Age of Onset  . Cancer Father     lung   Social History:  reports that she has never smoked. She does not have any smokeless tobacco history on file. She reports that she drinks alcohol. She reports that she does not use illicit drugs.  Allergies:  Allergies  Allergen Reactions  . Codeine     GI upset  . Morphine And Related     GI upset     (Not in a hospital admission)  Results for orders placed during the hospital encounter of 05/26/13 (from the past 48 hour(s))  CBC     Status: None   Collection Time    05/26/13  6:32 PM      Result Value Range   WBC 5.3  4.0 - 10.5 K/uL   RBC 4.19  3.87 - 5.11 MIL/uL   Hemoglobin 14.0  12.0 - 15.0 g/dL   HCT 39.5  36.0 - 46.0 %   MCV 94.3  78.0 - 100.0 fL   MCH 33.4  26.0 - 34.0 pg   MCHC 35.4  30.0 - 36.0 g/dL   RDW 13.5  11.5 - 15.5 %   Platelets 202  150 - 400 K/uL  BASIC METABOLIC PANEL     Status: Abnormal   Collection Time    05/26/13  6:32 PM      Result Value Range   Sodium 136 (*) 137 - 147 mEq/L   Potassium 4.0  3.7 - 5.3 mEq/L   Chloride 98  96 - 112 mEq/L   CO2 21  19 - 32 mEq/L   Glucose, Bld 117 (*) 70 - 99  mg/dL   BUN 13  6 - 23 mg/dL   Creatinine, Ser 0.72  0.50 - 1.10 mg/dL   Calcium 9.2  8.4 - 10.5 mg/dL   GFR calc non Af Amer 78 (*) >90 mL/min   GFR calc Af Amer >90  >90 mL/min   Comment: (NOTE)     The eGFR has been calculated using the CKD EPI equation.     This calculation has not been validated in all clinical situations.     eGFR's persistently <90 mL/min signify possible Chronic Kidney     Disease.  POCT I-STAT TROPONIN I     Status: None   Collection Time    05/26/13  6:50 PM      Result Value Range   Troponin i, poc 0.00  0.00 - 0.08 ng/mL   Comment 3            Comment: Due to  the release kinetics of cTnI,     a negative result within the first hours     of the onset of symptoms does not rule out     myocardial infarction with certainty.     If myocardial infarction is still suspected,     repeat the test at appropriate intervals.   No results found.  ROS Respiratory: Negative for shortness of breath.  Cardiovascular: Negative for chest pain.  All other systems reviewed and are negative.  Blood pressure 222/63, pulse 39, temperature 98.2 F (36.8 C), resp. rate 18, SpO2 100.00%.  Physical Exam  Constitutional: She is oriented to person, place, and time. She appears well-developed and well-nourished. No distress.  HENT: Head: Normocephalic and atraumatic. Conj-pink, Sclera-white. Mouth/Throat: Oropharynx is clear and moist.  Neck: Normal range of motion. Neck supple.  Cardiovascular: S1 and S2 normal. Irregular and slow heart rate. No murmur heard. Pulmonary/Chest: Effort normal and breath sounds normal. No respiratory distress. She has no wheezes.  Abdominal: Soft. Bowel sounds are normal. She exhibits no distension. There is no tenderness.  Musculoskeletal: Normal range of motion. She exhibits no edema.  Neurological: She is alert and oriented to person, place, and time. No cranial nerve deficit. She exhibits normal muscle tone. Coordination normal.  Skin: Skin  is warm and dry. She is not diaphoretic.  Assessment/Plan Complete heart block Hypertension  Admit/Check TSH IV atropine External pacemaker support C. Cath. Permanent pacemaker if not improving.  Rickard Kennerly S 05/26/2013, 8:43 PM

## 2013-06-11 NOTE — ED Provider Notes (Signed)
CSN: [id number]     Arrival date & time 06/11/13  1249 History   First MD Initiated Contact with Patient 06/11/13 1325     Chief Complaint  Patient presents with  . Chest Pain   (Consider location/radiation/quality/duration/timing/severity/associated sxs/prior Treatment) Patient is a 80 y.o. female presenting with chest pain. The history is provided by the patient and a relative. No language interpreter was used.  Chest Pain Pain location:  L lateral chest Pain quality: aching   Pain radiates to:  Does not radiate Pain radiates to the back: no   Pain severity:  Mild Context: breathing   Associated symptoms: cough, dizziness and shortness of breath   Associated symptoms: no fever   Associated symptoms comment:  Patient whose primary cardiologist is Dr. Doylene Canard, with a recent cardiac history as follows: 05/26/13 - symptomatic bradycardia and high degree block requiring pacemaker.Cath at that time showed non-obstructive CAD. Post-cath hematoma with hgb of 9.9.  06/05/13 - office follow up. Found to be in A-fib, started on Xarelto by Dr. Lovena Le.  06/07/13 - EMS to ED for near syncope, hypotension.  Today she returns to the emergency department with complaint of pain in the left lateral chest, SOB, continued dizziness and pleuritic pain. No fever. She was treated with a Z-pack recently for cough which has not changed. No nausea, vomiting. No full syncope.    Past Medical History  Diagnosis Date  . Arthritis   . Allergy   . Hypertension   . Colon polyps   . Heart block AV second degree     a. s/p MDT Addapta dual chamber pacemaker 05/2013 by Dr Rayann Heman.  Marland Kitchen PAF (paroxysmal atrial fibrillation)     a. Found post-pacer 05/2013->Xarelto added;  b. 05/2013 Echo: EF 60-65%, mild LVH, nl wall motion w/o rwma.  . Groin hematoma     a. 05/2013 R groin hematoma post-cath - u/s 05/31/13 large 7.49 cm r inguinal hematoma extending into thigh, no psa or avf.  . Anemia   . CAD (coronary artery disease)    a. 05/2013 nonobs dzs by cath.   Past Surgical History  Procedure Laterality Date  . Breast surgery  1959    cyst removal  . Bunionectomy  1967    both feet  . Pacemaker insertion  05/27/2013    MDT ADDRL1 implanted by Dr Rayann Heman for heart block   Family History  Problem Relation Age of Onset  . Cancer Father     lung   History  Substance Use Topics  . Smoking status: Never Smoker   . Smokeless tobacco: Not on file  . Alcohol Use: Yes     Comment: daily   OB History   Grav Para Term Preterm Abortions TAB SAB Ect Mult Living                 Review of Systems  Constitutional: Negative for fever and chills.  HENT: Negative.   Respiratory: Positive for cough and shortness of breath.   Cardiovascular: Positive for chest pain.  Gastrointestinal: Negative.   Musculoskeletal: Negative.   Skin: Negative.   Neurological: Positive for dizziness.       Complains of near syncope.    Allergies  Codeine and Morphine and related  Home Medications   Current Outpatient Rx  Name  Route  Sig  Dispense  Refill  . acetaminophen (TYLENOL) 325 MG tablet   Oral   Take 325 mg by mouth every 6 (six) hours as needed for mild pain.         Marland Kitchen  ALPRAZolam (XANAX) 0.25 MG tablet   Oral   Take 1 tablet (0.25 mg total) by mouth 2 (two) times daily as needed for anxiety.   60 tablet   0   . losartan (COZAAR) 100 MG tablet   Oral   Take 1 tablet (100 mg total) by mouth daily.   30 tablet   11   . metoprolol tartrate (LOPRESSOR) 50 MG tablet   Oral   Take 1 tablet (50 mg total) by mouth 2 (two) times daily.   60 tablet   3   . Rivaroxaban (XARELTO) 20 MG TABS tablet   Oral   Take 1 tablet (20 mg total) by mouth daily with supper.   30 tablet   11    BP 94/50  Pulse 88  Temp(Src) 97.8 F (36.6 C) (Oral)  SpO2 96% Physical Exam  Constitutional: She is oriented to person, place, and time. She appears well-developed and well-nourished.  HENT:  Head: Normocephalic.  Neck:  Normal range of motion. Neck supple.  Cardiovascular: Normal rate and regular rhythm.   No murmur heard. Pulmonary/Chest: Effort normal and breath sounds normal. She has no wheezes. She has no rales.  Abdominal: Soft. Bowel sounds are normal. There is no tenderness. There is no rebound and no guarding.  Musculoskeletal: Normal range of motion. She exhibits no edema.  Neurological: She is alert and oriented to person, place, and time.  Skin: Skin is warm and dry. No rash noted.  Psychiatric: She has a normal mood and affect.    ED Course  Procedures (including critical care time) Labs Review Labs Reviewed  CBC - Abnormal; Notable for the following:    RBC 2.77 (*)    Hemoglobin 9.4 (*)    HCT 26.1 (*)    Platelets 426 (*)    All other components within normal limits  BASIC METABOLIC PANEL  PRO B NATRIURETIC PEPTIDE  POCT I-STAT TROPONIN I   Imaging Review Dg Chest Port 1 View  06/11/2013   CLINICAL DATA:  Chest pain  EXAM: PORTABLE CHEST - 1 VIEW  COMPARISON:  06/07/2013  FINDINGS: The cardiac shadow is enlarged and significantly increased from the prior exam. This raises suspicion for pericardial effusion. A pacing device is again seen. The lungs are clear bilaterally.  IMPRESSION: Continued increase in the size of the cardiac shadow when compared with prior exams. This raises suspicion for pericardial effusion.   Electronically Signed   By: Inez Catalina M.D.   On: 06/11/2013 13:35    EKG Interpretation    Date/Time:  Wednesday June 11 2013 12:55:19 EST Ventricular Rate:  89 PR Interval:    QRS Duration: 132 QT Interval:  396 QTC Calculation: 481 R Axis:   -20 Text Interpretation:  Ventricular-paced rhythm with occasional atrial-paced complexes and with occasional Premature ventricular complexes Abnormal ECG No significant change since last tracing Confirmed by [email or web address]  MD, SCOTT ([id number]) on 06/11/2013 1:37:13 PM            MDM  No diagnosis found. 1. Pericardial  effusion  Discussed with Dr. Doylene Canard who will see the patient in the emergency department. Anticipate admission.    Dewaine Oats, PA-C 06/11/13 1506

## 2013-06-11 NOTE — Telephone Encounter (Signed)
Noted. Pt went to ER.  

## 2013-06-11 NOTE — Telephone Encounter (Signed)
Patient Information:  Caller Name: [name]  Phone: [phone]  Patient: Theresa Norton  Gender: Female  DOB: Dec 28, 1928  Age: 80 Years  PCP: Carolann Littler (Family Practice)  Office Follow Up:  Does the office need to follow up with this patient?: Yes  Instructions For The Office: FYI, RN/CAN advised 911 for post pacer proceedure 2 weeks ago and chest pain that daughter answered "yes" severe difficulty breathing and hard to speak.  RN Note:  Afebrile. Onset yesteday, 06/10/2013 started with intermittent chest pain. It hurts when she breaths in, per her daughter. She had a pacer put in about 2 weeks ago, daughter not sure of exact date. She was in the ER X2 for large hematoma at catheterization site and the second time for extreme dizziness and the ER did do a round of antibiotics preventativly. Today, 06/11/2013 she is complaining of chest pain even with no movement and RN/CAN advised 911 to evaluate since the daughter said "yes" to severe difficulty breathing and speaks in single words". Daughter thinks it is pleural and requests a Dr. visit to evaluate due to Mom not liking the ER. RN/CAN advised 911 and she agreed to use the ER.  Symptoms  Reason For Call & Symptoms: PM 05/2013, 2 weeks ago.  Reviewed Health History In EMR: Yes  Reviewed Medications In EMR: Yes  Reviewed Allergies In EMR: Yes  Reviewed Surgeries / Procedures: Yes  Date of Onset of Symptoms: 06/10/2013  Treatments Tried: Tylenol helped yesterday, 06/10/2013.  Treatments Tried Worked: Yes  Guideline(s) Used:  Chest Pain  Disposition Per Guideline:   Call EMS 911 Now  Reason For Disposition Reached:   Severe difficulty breathing (e.g., struggling for each breath, speaks in single words)  Advice Given:  N/A  Patient Refused Recommendation:  Patient Will Go To ED  She refused 911 and requested Dr. office visit. RN/CAN advised emergent evaluation needed and she states she will take her Mom to the ER.

## 2013-06-11 NOTE — H&P (Signed)
Theresa Norton is an 80 y.o. female.   Chief Complaint: Chest pain HPI: 80 year old female with recent pacemaker placement for complete heart block has left sided chest pain increased with breathing and cough. Echocardiogram revealed large pericardial effusion with tamponade.  Past Medical History  Diagnosis Date  . Arthritis   . Allergy   . Hypertension   . Colon polyps   . Heart block AV second degree     a. s/p MDT Addapta dual chamber pacemaker 05/2013 by Dr Rayann Heman.  Marland Kitchen PAF (paroxysmal atrial fibrillation)     a. Found post-pacer 05/2013->Xarelto added;  b. 05/2013 Echo: EF 60-65%, mild LVH, nl wall motion w/o rwma.  . Groin hematoma     a. 05/2013 R groin hematoma post-cath - u/s 05/31/13 large 7.49 cm r inguinal hematoma extending into thigh, no psa or avf.  . Anemia   . CAD (coronary artery disease)     a. 05/2013 nonobs dzs by cath.      Past Surgical History  Procedure Laterality Date  . Breast surgery  1959    cyst removal  . Bunionectomy  1967    both feet  . Pacemaker insertion  05/27/2013    MDT [id number] implanted by Dr Rayann Heman for heart block    Family History  Problem Relation Age of Onset  . Cancer Father     lung   Social History:  reports that she has never smoked. She does not have any smokeless tobacco history on file. She reports that she drinks alcohol. She reports that she does not use illicit drugs.  Allergies:  Allergies  Allergen Reactions  . Codeine     GI upset  . Morphine And Related     GI upset    Medications Prior to Admission  Medication Sig Dispense Refill  . acetaminophen (TYLENOL) 325 MG tablet Take 325 mg by mouth every 6 (six) hours as needed for mild pain.      Marland Kitchen ALPRAZolam (XANAX) 0.25 MG tablet Take 1 tablet (0.25 mg total) by mouth 2 (two) times daily as needed for anxiety.  60 tablet  0  . losartan (COZAAR) 100 MG tablet Take 1 tablet (100 mg total) by mouth daily.  30 tablet  11  . metoprolol tartrate (LOPRESSOR) 50 MG tablet  Take 1 tablet (50 mg total) by mouth 2 (two) times daily.  60 tablet  3  . Rivaroxaban (XARELTO) 20 MG TABS tablet Take 1 tablet (20 mg total) by mouth daily with supper.  30 tablet  11    Results for orders placed during the hospital encounter of 06/11/13 (from the past 48 hour(s))  CBC     Status: Abnormal   Collection Time    06/11/13  1:10 PM      Result Value Range   WBC 10.3  4.0 - 10.5 K/uL   RBC 2.77 (*) 3.87 - 5.11 MIL/uL   Hemoglobin 9.4 (*) 12.0 - 15.0 g/dL   HCT 26.1 (*) 36.0 - 46.0 %   MCV 94.2  78.0 - 100.0 fL   MCH 33.9  26.0 - 34.0 pg   MCHC 36.0  30.0 - 36.0 g/dL   RDW 13.7  11.5 - 15.5 %   Platelets 426 (*) 150 - 400 K/uL  BASIC METABOLIC PANEL     Status: Abnormal   Collection Time    06/11/13  1:10 PM      Result Value Range   Sodium 124 (*) 137 -  147 mEq/L   Potassium 4.7  3.7 - 5.3 mEq/L   Chloride 90 (*) 96 - 112 mEq/L   CO2 17 (*) 19 - 32 mEq/L   Glucose, Bld 126 (*) 70 - 99 mg/dL   BUN 33 (*) 6 - 23 mg/dL   Creatinine, Ser 2.10 (*) 0.50 - 1.10 mg/dL   Calcium 8.7  8.4 - 10.5 mg/dL   GFR calc non Af Amer 21 (*) >90 mL/min   GFR calc Af Amer 24 (*) >90 mL/min   Comment: (NOTE)     The eGFR has been calculated using the CKD EPI equation.     This calculation has not been validated in all clinical situations.     eGFR's persistently <90 mL/min signify possible Chronic Kidney     Disease.  PRO B NATRIURETIC PEPTIDE     Status: Abnormal   Collection Time    06/11/13  1:10 PM      Result Value Range   Pro B Natriuretic peptide (BNP) 1594.0 (*) 0 - 450 pg/mL  POCT I-STAT TROPONIN I     Status: None   Collection Time    06/11/13  1:25 PM      Result Value Range   Troponin i, poc 0.00  0.00 - 0.08 ng/mL   Comment 3            Comment: Due to the release kinetics of cTnI,     a negative result within the first hours     of the onset of symptoms does not rule out     myocardial infarction with certainty.     If myocardial infarction is still suspected,      repeat the test at appropriate intervals.   Dg Chest Port 1 View  06/11/2013   CLINICAL DATA:  Chest pain  EXAM: PORTABLE CHEST - 1 VIEW  COMPARISON:  06/07/2013  FINDINGS: The cardiac shadow is enlarged and significantly increased from the prior exam. This raises suspicion for pericardial effusion. A pacing device is again seen. The lungs are clear bilaterally.  IMPRESSION: Continued increase in the size of the cardiac shadow when compared with prior exams. This raises suspicion for pericardial effusion.   Electronically Signed   By: Inez Catalina M.D.   On: 06/11/2013 13:35    ROS Constitutional: Negative for fever and chills.  HENT: Negative.  Respiratory: Positive for cough and shortness of breath.  Cardiovascular: Positive for chest pain.  Gastrointestinal: Negative.  Musculoskeletal: Negative.  Skin: Negative.  Neurological: Positive for dizziness.  Complains of near syncope.   Blood pressure 129/85, pulse 38, temperature 97.8 F (36.6 C), temperature source Oral, resp. rate 19, SpO2 98.00%.  HEENT: [place]/AT, Eyes-Hazel, PERL, EOMI, Conjunctiva-Pink, Sclera-Non-icteric  Neck: + JVD, No bruit, Trachea midline.  Lungs: Clear, Bilateral. Left pectoral pacer pocket-stable.  Cardiac: Regular rhythm, soft S1 and S2, no S3.   Abdomen: Soft, non-tender.  Extremities: No edema present. No cyanosis. No clubbing. Large right groin ecchymosis.  CNS: AxOx3, Cranial nerves grossly intact, moves all 4 extremities. Right handed.  Skin: Warm and dry.  Assessment/Plan Pericardial effusion with cardiac tamponade Hypertension  Mild native vessel coronary artery disease  Anemia of blood loss and iron deficiency  Anxiety  Weakness S/P dual chamber pacemaker placement  Theresa Norton S 06/11/2013, 4:12 PM

## 2013-06-11 NOTE — CV Procedure (Signed)
Pericardiocentesis  Procedure Note Theresa Norton [id number] 25-Dec-1928  Procedure: Insertion of Pericardicentesis Catheter Indications: Cardiac tamponade  Procedure Details Consent: Risks of procedure as well as the alternatives and risks of each were explained to the (patient/caregiver).  Consent for procedure obtained. Time Out: Verified patient identification, verified procedure, site/side was marked, verified correct patient position, special equipment/implants available, medications/allergies/relevent history reviewed, required imaging and test results available.  Performed  Maximum sterile technique was used including antiseptics, cap, gloves, gown, hand hygiene, mask and sheet. Skin prep: Chlorhexidine; local anesthetic administered A antimicrobial bonded/coated single lumen catheter was placed in the pericardial place using the Seldinger technique. Line was sutured in place and sterile dressing applied.  Evaluation 820 cc of bloody pericardial fluid was recovered and sent to lab for chemistry, cell count, cytology and culture.  Complications: No apparent complications Patient did tolerate procedure well.  Fluoroscopy used to confirm wire and catheter location.   Theresa Norton S 06/11/2013, 5:37 PM

## 2013-06-11 NOTE — ED Notes (Signed)
Pt reports pain to left side of chest. Seen on Sunday for shortness of breath, given z-pack. Not helping, increase in shortness of breath. Describes pain as pressing and stabbing. Pain 8/10. Reports dizziness.

## 2013-06-11 NOTE — Interval H&P Note (Signed)
History and Physical Interval Note:  06/11/2013 4:12 PM  Theresa Norton  has presented today for surgery, with the diagnosis of effusion  The various methods of treatment have been discussed with the patient and family. After consideration of risks, benefits and other options for treatment, the patient has consented to  Procedure(s): PERICARDIAL TAP (N/A) as a surgical intervention .  The patient's history has been reviewed, patient examined, no change in status, stable for surgery.  I have reviewed the patient's chart and labs.  Questions were answered to the patient's satisfaction.     Khaylee Mcevoy S

## 2013-06-11 NOTE — Progress Notes (Signed)
  Echocardiogram 2D Echocardiogram limited has been performed.  Diamond Nickel 06/11/2013, 3:55 PM

## 2013-06-11 NOTE — ED Provider Notes (Signed)
Medical screening examination/treatment/procedure(s) were conducted as a shared visit with non-physician practitioner(s) and myself.  I personally evaluated the patient during the encounter.  EKG Interpretation    Date/Time:  Wednesday June 11 2013 12:55:19 EST Ventricular Rate:  89 PR Interval:    QRS Duration: 132 QT Interval:  396 QTC Calculation: 481 R Axis:   -20 Text Interpretation:  Ventricular-paced rhythm with occasional atrial-paced complexes and with occasional Premature ventricular complexes Abnormal ECG No significant change since last tracing Confirmed by Lytle Malburg  MD, Fonda Rochon ([id number]) on 06/11/2013 1:37:13 PM            Results for orders placed during the hospital encounter of 06/11/13  CBC      Result Value Range   WBC 10.3  4.0 - 10.5 K/uL   RBC 2.77 (*) 3.87 - 5.11 MIL/uL   Hemoglobin 9.4 (*) 12.0 - 15.0 g/dL   HCT 26.1 (*) 36.0 - 46.0 %   MCV 94.2  78.0 - 100.0 fL   MCH 33.9  26.0 - 34.0 pg   MCHC 36.0  30.0 - 36.0 g/dL   RDW 13.7  11.5 - 15.5 %   Platelets 426 (*) 150 - 400 K/uL  BASIC METABOLIC PANEL      Result Value Range   Sodium 124 (*) 137 - 147 mEq/L   Potassium 4.7  3.7 - 5.3 mEq/L   Chloride 90 (*) 96 - 112 mEq/L   CO2 17 (*) 19 - 32 mEq/L   Glucose, Bld 126 (*) 70 - 99 mg/dL   BUN 33 (*) 6 - 23 mg/dL   Creatinine, Ser 2.10 (*) 0.50 - 1.10 mg/dL   Calcium 8.7  8.4 - 10.5 mg/dL   GFR calc non Af Amer 21 (*) >90 mL/min   GFR calc Af Amer 24 (*) >90 mL/min  PRO B NATRIURETIC PEPTIDE      Result Value Range   Pro B Natriuretic peptide (BNP) 1594.0 (*) 0 - 450 pg/mL  POCT I-STAT TROPONIN I      Result Value Range   Troponin i, poc 0.00  0.00 - 0.08 ng/mL   Comment 3            Dg Chest 2 View  05/28/2013   CLINICAL DATA:  Post pacemaker insertion  EXAM: CHEST  2 VIEW  COMPARISON:  10/03/2012  FINDINGS: Borderline cardiomegaly. Mild thoracic dextroscoliosis. There is a dual lead cardiac pacemaker with left subclavian approach with leads in  right atrium and right ventricle. No diagnostic pneumothorax. No acute infiltrate or pulmonary edema.  IMPRESSION: No active disease. Dual lead cardiac pacemaker in place. No diagnostic pneumothorax.   Electronically Signed   By: Lahoma Crocker M.D.   On: 05/28/2013 09:02   Dg Chest Port 1 View  06/11/2013   CLINICAL DATA:  Chest pain  EXAM: PORTABLE CHEST - 1 VIEW  COMPARISON:  06/07/2013  FINDINGS: The cardiac shadow is enlarged and significantly increased from the prior exam. This raises suspicion for pericardial effusion. A pacing device is again seen. The lungs are clear bilaterally.  IMPRESSION: Continued increase in the size of the cardiac shadow when compared with prior exams. This raises suspicion for pericardial effusion.   Electronically Signed   By: Inez Catalina M.D.   On: 06/11/2013 13:35   Dg Chest Port 1 View  06/07/2013   CLINICAL DATA:  Shortness of breath, dizziness, history hypertension  EXAM: PORTABLE CHEST - 1 VIEW  COMPARISON:  Portable exam 1254 hr compared  to 05/28/2013  FINDINGS: Left subclavian transvenous pacemaker leads project over right atrium and right ventricle.  Enlargement of cardiac silhouette.  Mediastinal contours and pulmonary vascularity normal.  Minimal bronchitic changes.  New opacity at the left lung base likely represents a small left pleural effusion with mild atelectasis versus consolidation in left lower lobe, with interval loss of the left diaphragmatic silhouette.  Remaining lungs clear.  No pneumothorax.  Bones demineralized.  IMPRESSION:  Enlargement of cardiac silhouette post pacemaker. Small left pleural effusion with question atelectasis versus consolidation in left lower lobe new since previous exam.   Electronically Signed   By: Lavonia Dana M.D.   On: 06/07/2013 13:16    Patient with new pleural effusion as far as we can tell. Also with some hypotensive episodes. Patient seen by me. Patient followed by Dr. to Dr. Patient with recent cardiac problems to  include bradycardia with high degree of block requiring a pacemaker placement. At that time showed nonobstructive coronary disease. No good explanation for the pericardial effusion at this time. Consult cardiology for evaluation and due to the hypotension clearly will require admission. Patient's complaint on presentation was just chest pain.  Mervin Kung, MD 06/11/13 [phone]

## 2013-06-12 ENCOUNTER — Inpatient Hospital Stay (HOSPITAL_COMMUNITY): Payer: Medicare Other

## 2013-06-12 DIAGNOSIS — I2510 Atherosclerotic heart disease of native coronary artery without angina pectoris: Secondary | ICD-10-CM | POA: Diagnosis not present

## 2013-06-12 DIAGNOSIS — Z950 Presence of cardiac pacemaker: Secondary | ICD-10-CM | POA: Diagnosis not present

## 2013-06-12 DIAGNOSIS — I319 Disease of pericardium, unspecified: Secondary | ICD-10-CM | POA: Diagnosis not present

## 2013-06-12 DIAGNOSIS — I314 Cardiac tamponade: Secondary | ICD-10-CM | POA: Diagnosis not present

## 2013-06-12 DIAGNOSIS — J90 Pleural effusion, not elsewhere classified: Secondary | ICD-10-CM | POA: Diagnosis not present

## 2013-06-12 DIAGNOSIS — D509 Iron deficiency anemia, unspecified: Secondary | ICD-10-CM | POA: Diagnosis not present

## 2013-06-12 LAB — BASIC METABOLIC PANEL
BUN: 26 mg/dL — ABNORMAL HIGH (ref 6–23)
CO2: 18 mEq/L — ABNORMAL LOW (ref 19–32)
Calcium: 8.2 mg/dL — ABNORMAL LOW (ref 8.4–10.5)
Chloride: 93 mEq/L — ABNORMAL LOW (ref 96–112)
Creatinine, Ser: 1.29 mg/dL — ABNORMAL HIGH (ref 0.50–1.10)
GFR calc Af Amer: 43 mL/min — ABNORMAL LOW (ref >90)
GFR calc non Af Amer: 37 mL/min — ABNORMAL LOW (ref >90)
Glucose, Bld: 93 mg/dL (ref 70–99)
Potassium: 4.4 mEq/L (ref 3.7–5.3)
Sodium: 128 mEq/L — ABNORMAL LOW (ref 137–147)

## 2013-06-12 LAB — CBC
HCT: 26.8 % — ABNORMAL LOW (ref 36.0–46.0)
Hemoglobin: 9.2 g/dL — ABNORMAL LOW (ref 12.0–15.0)
MCH: 31.9 pg (ref 26.0–34.0)
MCHC: 34.3 g/dL (ref 30.0–36.0)
MCV: 93.1 fL (ref 78.0–100.0)
Platelets: 492 10*3/uL — ABNORMAL HIGH (ref 150–400)
RBC: 2.88 MIL/uL — ABNORMAL LOW (ref 3.87–5.11)
RDW: 13.6 % (ref 11.5–15.5)
WBC: 9.6 10*3/uL (ref 4.0–10.5)

## 2013-06-12 LAB — PH, BODY FLUID: pH, Fluid: 8.5

## 2013-06-12 MED ORDER — TRAMADOL HCL 50 MG PO TABS
50.0000 mg | ORAL_TABLET | Freq: Once | ORAL | Status: AC
  Administered 2013-06-12: 50 mg via ORAL
  Filled 2013-06-12: qty 1

## 2013-06-12 MED ORDER — TRAMADOL HCL 50 MG PO TABS
50.0000 mg | ORAL_TABLET | Freq: Four times a day (QID) | ORAL | Status: DC | PRN
  Administered 2013-06-12 – 2013-06-16 (×7): 50 mg via ORAL
  Filled 2013-06-12 (×8): qty 1

## 2013-06-12 NOTE — Progress Notes (Signed)
Recent events are noted.  Pt with pericardial effusion s/p PPM. During her recent implant, I initially placed a passive ventricular lead.  This lead did not sit in a suitable position and I believe may have caused perforation at that time.  The lead was removed an an active ventricular lead was placed.   This am, she has trivial pericardial effusion on bedside echo performed by me with about 5cc/hour fluid noted from her drain overnight. She denies CP or any other symptoms presently. CXR is reviewed and lead appears to be stable.  I think that it is best to manage conservatively and follow for now. I will ask [employer] to re-interrogate the device at this time.  EP will follow along.

## 2013-06-12 NOTE — Progress Notes (Signed)
Subjective:  Feeling better. 60 cc bloody drainage overnight from pericardium. Blood pressure 117/57. Afebrile.  Objective:  Vital Signs in the last 24 hours: Temp:  [97.6 F (36.4 C)-98.7 F (37.1 C)] 97.9 F (36.6 C) (02/05 0400) Pulse Rate:  [38-101] 77 (02/05 0700) Cardiac Rhythm:  [-] Ventricular paced (02/05 0000) Resp:  [14-25] 22 (02/05 0700) BP: (94-171)/(50-85) 117/57 mmHg (02/05 0700) SpO2:  [92 %-100 %] 92 % (02/05 0700) Weight:  [72.8 kg (160 lb 7.9 oz)] 72.8 kg (160 lb 7.9 oz) (02/05 0500)  Physical Exam: BP Readings from Last 1 Encounters:  06/12/13 117/57     Wt Readings from Last 1 Encounters:  06/12/13 72.8 kg (160 lb 7.9 oz)    Weight change:   HEENT: [place]/AT, Eyes-Brown, PERL, EOMI, Conjunctiva-Pale pink, Sclera-Non-icteric Neck: No JVD, No bruit, Trachea midline. Lungs:  Clear, Bilateral. Cardiac:  Regular rhythm, normal S1 and S2, no S3. Pericardial rub faintly audible, was louder yesterday evening post procedure. Abdomen:  Soft, non-tender. Extremities:  No edema present. No cyanosis. No clubbing. CNS: AxOx3, Cranial nerves grossly intact, moves all 4 extremities. Right handed. Skin: Warm and dry.   Intake/Output from previous day: 02/04 0701 - 02/05 0700 In: 630 [P.O.:480; I.V.:150] Out: 700 [Urine:700]    Lab Results: BMET    Component Value Date/Time   NA 128* 06/12/2013 0240   K 4.4 06/12/2013 0240   CL 93* 06/12/2013 0240   CO2 18* 06/12/2013 0240   GLUCOSE 93 06/12/2013 0240   BUN 26* 06/12/2013 0240   CREATININE 1.29* 06/12/2013 0240   CALCIUM 8.2* 06/12/2013 0240   GFRNONAA 37* 06/12/2013 0240   GFRAA 43* 06/12/2013 0240   CBC    Component Value Date/Time   WBC 9.6 06/12/2013 0240   RBC 2.88* 06/12/2013 0240   HGB 9.2* 06/12/2013 0240   HCT 26.8* 06/12/2013 0240   PLT 492* 06/12/2013 0240   MCV 93.1 06/12/2013 0240   MCH 31.9 06/12/2013 0240   MCHC 34.3 06/12/2013 0240   RDW 13.6 06/12/2013 0240   LYMPHSABS 0.8 06/07/2013 1152   MONOABS 0.9 06/07/2013 1152   EOSABS 0.1 06/07/2013 1152   BASOSABS 0.0 06/07/2013 1152   CARDIAC ENZYMES Lab Results  Component Value Date   TROPONINI <0.30 06/07/2013    Scheduled Meds: . aspirin  81 mg Oral Pre-Cath  . colchicine  0.6 mg Oral Daily  . metoprolol tartrate  50 mg Oral BID  . sodium chloride  3 mL Intravenous Q12H  . sodium chloride  3 mL Intravenous Q12H   Continuous Infusions:  PRN Meds:.sodium chloride, sodium chloride, acetaminophen, ALPRAZolam, sodium chloride, sodium chloride  Assessment/Plan: Pericardial effusion with cardiac tamponade  Hypertension  Mild native vessel coronary artery disease  Anemia of blood loss and iron deficiency  Anxiety  Weakness  S/P dual chamber pacemaker placement  Appreciate Dr. Jackalyn Lombard input. Will hold all antiplatelets and blood thinners for at-least 2 weeks. Awaiting reading on prior CT scan of chest.   LOS: 1 day    Dixie Dials  MD  06/12/2013, 8:32 AM

## 2013-06-13 ENCOUNTER — Encounter (HOSPITAL_COMMUNITY)
Admission: EM | Disposition: A | Discharge status: Home Health | Payer: Self-pay | Source: Home / Self Care | Attending: Cardiovascular Disease

## 2013-06-13 DIAGNOSIS — T82120A Displacement of cardiac electrode, initial encounter: Secondary | ICD-10-CM | POA: Insufficient documentation

## 2013-06-13 DIAGNOSIS — I314 Cardiac tamponade: Secondary | ICD-10-CM | POA: Diagnosis not present

## 2013-06-13 DIAGNOSIS — I2510 Atherosclerotic heart disease of native coronary artery without angina pectoris: Secondary | ICD-10-CM | POA: Diagnosis not present

## 2013-06-13 DIAGNOSIS — T82190A Other mechanical complication of cardiac electrode, initial encounter: Secondary | ICD-10-CM

## 2013-06-13 DIAGNOSIS — D509 Iron deficiency anemia, unspecified: Secondary | ICD-10-CM | POA: Diagnosis not present

## 2013-06-13 DIAGNOSIS — Z950 Presence of cardiac pacemaker: Secondary | ICD-10-CM | POA: Diagnosis not present

## 2013-06-13 HISTORY — PX: LEAD REVISION: SHX5945

## 2013-06-13 LAB — BASIC METABOLIC PANEL
BUN: 21 mg/dL (ref 6–23)
CO2: 20 mEq/L (ref 19–32)
Calcium: 8.2 mg/dL — ABNORMAL LOW (ref 8.4–10.5)
Chloride: 93 mEq/L — ABNORMAL LOW (ref 96–112)
Creatinine, Ser: 0.88 mg/dL (ref 0.50–1.10)
GFR calc Af Amer: 69 mL/min — ABNORMAL LOW (ref >90)
GFR calc non Af Amer: 60 mL/min — ABNORMAL LOW (ref >90)
Glucose, Bld: 104 mg/dL — ABNORMAL HIGH (ref 70–99)
Potassium: 4.5 mEq/L (ref 3.7–5.3)
Sodium: 128 mEq/L — ABNORMAL LOW (ref 137–147)

## 2013-06-13 LAB — CBC
HCT: 28.2 % — ABNORMAL LOW (ref 36.0–46.0)
Hemoglobin: 9.8 g/dL — ABNORMAL LOW (ref 12.0–15.0)
MCH: 32.5 pg (ref 26.0–34.0)
MCHC: 34.8 g/dL (ref 30.0–36.0)
MCV: 93.4 fL (ref 78.0–100.0)
Platelets: 546 10*3/uL — ABNORMAL HIGH (ref 150–400)
RBC: 3.02 MIL/uL — ABNORMAL LOW (ref 3.87–5.11)
RDW: 13.6 % (ref 11.5–15.5)
WBC: 12.9 10*3/uL — ABNORMAL HIGH (ref 4.0–10.5)

## 2013-06-13 SURGERY — LEAD REVISION
Anesthesia: LOCAL

## 2013-06-13 MED ORDER — ACETAMINOPHEN 325 MG PO TABS
325.0000 mg | ORAL_TABLET | ORAL | Status: DC | PRN
  Administered 2013-06-13 – 2013-06-16 (×3): 325 mg via ORAL
  Filled 2013-06-13 (×3): qty 1

## 2013-06-13 MED ORDER — CHLORHEXIDINE GLUCONATE 4 % EX LIQD
60.0000 mL | Freq: Once | CUTANEOUS | Status: AC
  Administered 2013-06-13: 4 via TOPICAL

## 2013-06-13 MED ORDER — CEFAZOLIN SODIUM 1-5 GM-% IV SOLN
1.0000 g | Freq: Four times a day (QID) | INTRAVENOUS | Status: DC
  Filled 2013-06-13 (×3): qty 50

## 2013-06-13 MED ORDER — HEPARIN (PORCINE) IN NACL 2-0.9 UNIT/ML-% IJ SOLN
INTRAMUSCULAR | Status: AC
Start: 2013-06-13 — End: 2013-06-13
  Filled 2013-06-13: qty 500

## 2013-06-13 MED ORDER — SODIUM CHLORIDE 0.9 % IJ SOLN: 3.0000 mL | INTRAMUSCULAR | Status: DC | PRN

## 2013-06-13 MED ORDER — SODIUM CHLORIDE 0.9 % IJ SOLN
3.0000 mL | Freq: Two times a day (BID) | INTRAMUSCULAR | Status: DC
  Administered 2013-06-13 – 2013-06-19 (×10): 3 mL via INTRAVENOUS

## 2013-06-13 MED ORDER — SODIUM CHLORIDE 0.9 % IV SOLN
INTRAVENOUS | Status: DC
  Administered 2013-06-13: 09:00:00 via INTRAVENOUS

## 2013-06-13 MED ORDER — MIDAZOLAM HCL 5 MG/5ML IJ SOLN
INTRAMUSCULAR | Status: AC
  Filled 2013-06-13: qty 5

## 2013-06-13 MED ORDER — CEFAZOLIN SODIUM-DEXTROSE 2-3 GM-% IV SOLR
2.0000 g | INTRAVENOUS | Status: AC
Start: 2013-06-13 — End: 2013-06-13
  Administered 2013-06-13: 2 g via INTRAVENOUS
  Filled 2013-06-13: qty 50

## 2013-06-13 MED ORDER — ONDANSETRON HCL 4 MG/2ML IJ SOLN: 4.0000 mg | Freq: Four times a day (QID) | INTRAMUSCULAR | Status: DC | PRN

## 2013-06-13 MED ORDER — FENTANYL CITRATE 0.05 MG/ML IJ SOLN
INTRAMUSCULAR | Status: AC
  Filled 2013-06-13: qty 2

## 2013-06-13 MED ORDER — CHLORHEXIDINE GLUCONATE 4 % EX LIQD
60.0000 mL | Freq: Once | CUTANEOUS | Status: AC
Start: 2013-06-13 — End: 2013-06-13
  Administered 2013-06-13: 4 via TOPICAL
  Filled 2013-06-13: qty 60

## 2013-06-13 MED ORDER — SODIUM CHLORIDE 0.9 % IV SOLN: 250.0000 mL | INTRAVENOUS | Status: DC | PRN

## 2013-06-13 MED ORDER — LIDOCAINE HCL (PF) 1 % IJ SOLN
INTRAMUSCULAR | Status: AC
  Filled 2013-06-13: qty 60

## 2013-06-13 MED ORDER — CEFAZOLIN SODIUM 1-5 GM-% IV SOLN
1.0000 g | Freq: Four times a day (QID) | INTRAVENOUS | Status: AC
  Administered 2013-06-13 – 2013-06-14 (×3): 1 g via INTRAVENOUS
  Filled 2013-06-13 (×3): qty 50

## 2013-06-13 MED ORDER — SODIUM CHLORIDE 0.9 % IR SOLN
80.0000 mg | Status: DC
  Administered 2013-06-13: 80 mg
  Filled 2013-06-13 (×2): qty 2

## 2013-06-13 NOTE — Progress Notes (Signed)
[facility]  Patient Status: Active (receiving services up to time of hospitalization)  [facility] is providing the following services: RN and PT  If patient discharges after hours, please call [phone].   Theresa Norton 06/13/2013, 2:06 PM

## 2013-06-13 NOTE — Interval H&P Note (Signed)
History and Physical Interval Note:  06/13/2013 1:55 PM  Theresa Norton  has presented today for surgery, with the diagnosis of dislogde lead  The various methods of treatment have been discussed with the patient and family. After consideration of risks, benefits and other options for treatment, the patient has consented to  Procedure(s): LEAD REVISION (N/A) as a surgical intervention .  The patient's history has been reviewed, patient examined, no change in status, stable for surgery.  I have reviewed the patient's chart and labs.  Questions were answered to the patient's satisfaction.     Thompson Grayer

## 2013-06-13 NOTE — Progress Notes (Signed)
S/p pacemaker system revision today. Consider removing pericardial drain tomorrow if no significant drainage overnight. Would observe in stepdown tomorrow and then transfer to telemetry if doing well on Sunday.  Dr Lovena Le to follow-up on patient in am

## 2013-06-13 NOTE — Brief Op Note (Signed)
Successful RV lead revision for cardiac perforation. The right atrial lead was also repositioned.  No early apparently complications.  See dictation

## 2013-06-13 NOTE — H&P (View-Only) (Signed)
SUBJECTIVE: The patient has had persistent minimal pain overnight.  No shortness of breath  Pericardial drain with 10cc of output since 7PM.  CT scan suggests perforation of the V lead.  CURRENT MEDICATIONS: . colchicine  0.6 mg Oral Daily  . metoprolol tartrate  50 mg Oral BID  . sodium chloride  3 mL Intravenous Q12H      OBJECTIVE: Physical Exam: Filed Vitals:   06/13/13 0200 06/13/13 0300 06/13/13 0400 06/13/13 0500  BP: 102/65 123/60 118/71 148/87  Pulse: 87 86 87 87  Temp:   98.5 F (36.9 C)   TempSrc:   Oral   Resp: 22 21 19 20   Height:      Weight:    159 lb 6.3 oz (72.3 kg)  SpO2: 93% 92% 91% 92%    Intake/Output Summary (Last 24 hours) at 06/13/13 0617 Last data filed at 06/13/13 0500  Gross per 24 hour  Intake    923 ml  Output   2245 ml  Net  -1322 ml    Telemetry reveals sinus rhythm with ventricular pacing  GEN- The patient is ill appearing, alert and oriented x 3 today.   Head- normocephalic, atraumatic Eyes-  Sclera clear, conjunctiva pink Ears- hearing intact Oropharynx- clear Neck- supple  Lungs- Clear to ausculation bilaterally, normal work of breathing Heart- Regular rate and rhythm,  GI- soft, NT, ND, + BS Extremities- no clubbing, cyanosis, or edema Skin- no rash or lesion Psych- euthymic mood, full affect Neuro- strength and sensation are intact  LABS: Basic Metabolic Panel:  Recent Labs  06/12/13 0240 06/13/13 0230  NA 128* 128*  K 4.4 4.5  CL 93* 93*  CO2 18* 20  GLUCOSE 93 104*  BUN 26* 21  CREATININE 1.29* 0.88  CALCIUM 8.2* 8.2*   CBC:  Recent Labs  06/12/13 0240 06/13/13 0230  WBC 9.6 12.9*  HGB 9.2* 9.8*  HCT 26.8* 28.2*  MCV 93.1 93.4  PLT 492* 546*    RADIOLOGY: Dg Chest 2 View 06/12/2013   CLINICAL DATA:  Cardiomegaly  EXAM: CHEST  2 VIEW  COMPARISON:  06/11/2013  FINDINGS: Cardiac shadow is less prominent than that seen on the previous day. A pericardial drain is noted overlying the cardiac shadow  inferiorly. . A pacing device is again seen. Increasing left-sided pleural effusion is noted. The right lung shows a small effusion posteriorly. No acute bony abnormality is noted.  IMPRESSION: Bilateral pleural effusions left greater the right. The left effusion has increased somewhat in the interval from the prior exam.  Status post pericardial drain with decrease in size of the cardiac shadow.   Electronically Signed   By: Inez Catalina M.D.   On: 06/12/2013 09:03   Ct Chest Wo Contrast 06/12/2013   CLINICAL DATA:  Status post pacemaker placement January, 2015. Hemopericardium. Pericardial drainage catheter in place.  EXAM: CT CHEST WITHOUT CONTRAST  TECHNIQUE: Multidetector CT imaging of the chest was performed following the standard protocol without IV contrast.  COMPARISON:  Plain film of the chest 06/11/2013 and 06/07/2013.  FINDINGS: Dual lead pacing device is in place. The right ventricular lead penetrates the anterior wall of the myocardium and is in epicardial fat. The patient has a pericardial drainage catheter in place. There is a small to moderate amount of hemopericardium. The right atrial lead appears to be in good position. Small to moderate simple bilateral pleural effusions are present, larger on the left. No axillary, hilar or mediastinal lymphadenopathy. Lungs demonstrate compressive atelectasis  in the lower lung zones, much worse on the left. Visualized upper abdomen shows a 1.1 cm hypoattenuating lesion the right hepatic lobe likely representing a cyst. Partial visualization of a 0.7 cm hyper attenuating lesion the upper pole the right kidney likely representing a complex cyst. No focal bony abnormality identified.  IMPRESSION: Pacing device in place. The right ventricular lead penetrates the myocardium. There is associated hemopericardium with a drain in place.  Small to moderate bilateral pleural effusions, larger on the left. Associated compressive atelectasis is much worse than left.   Findings discussed with Dr. Doylene Canard.   Electronically Signed   By: Inge Rise M.D.   On: 06/12/2013 19:16   ASSESSMENT AND PLAN:  Active Problems:   Pericardial effusion with cardiac tamponade  1. RV lead perforation/ complete heart block She will require lead revision today.  I would recommend that we keep the pericardial drain in place until afterwards. Risks, benefits, and alternatives to lead revision were discussed in detail with the patient who wishes to proceed.

## 2013-06-13 NOTE — Progress Notes (Signed)
 SUBJECTIVE: The patient has had persistent minimal pain overnight.  No shortness of breath  Pericardial drain with 10cc of output since 7PM.  CT scan suggests perforation of the V lead.  CURRENT MEDICATIONS: . colchicine  0.6 mg Oral Daily  . metoprolol tartrate  50 mg Oral BID  . sodium chloride  3 mL Intravenous Q12H      OBJECTIVE: Physical Exam: Filed Vitals:   06/13/13 0200 06/13/13 0300 06/13/13 0400 06/13/13 0500  BP: 102/65 123/60 118/71 148/87  Pulse: 87 86 87 87  Temp:   98.5 F (36.9 C)   TempSrc:   Oral   Resp: 22 21 19 20  Height:      Weight:    159 lb 6.3 oz (72.3 kg)  SpO2: 93% 92% 91% 92%    Intake/Output Summary (Last 24 hours) at 06/13/13 0617 Last data filed at 06/13/13 0500  Gross per 24 hour  Intake    923 ml  Output   2245 ml  Net  -1322 ml    Telemetry reveals sinus rhythm with ventricular pacing  GEN- The patient is ill appearing, alert and oriented x 3 today.   Head- normocephalic, atraumatic Eyes-  Sclera clear, conjunctiva pink Ears- hearing intact Oropharynx- clear Neck- supple  Lungs- Clear to ausculation bilaterally, normal work of breathing Heart- Regular rate and rhythm,  GI- soft, NT, ND, + BS Extremities- no clubbing, cyanosis, or edema Skin- no rash or lesion Psych- euthymic mood, full affect Neuro- strength and sensation are intact  LABS: Basic Metabolic Panel:  Recent Labs  06/12/13 0240 06/13/13 0230  NA 128* 128*  K 4.4 4.5  CL 93* 93*  CO2 18* 20  GLUCOSE 93 104*  BUN 26* 21  CREATININE 1.29* 0.88  CALCIUM 8.2* 8.2*   CBC:  Recent Labs  06/12/13 0240 06/13/13 0230  WBC 9.6 12.9*  HGB 9.2* 9.8*  HCT 26.8* 28.2*  MCV 93.1 93.4  PLT 492* 546*    RADIOLOGY: Dg Chest 2 View 06/12/2013   CLINICAL DATA:  Cardiomegaly  EXAM: CHEST  2 VIEW  COMPARISON:  06/11/2013  FINDINGS: Cardiac shadow is less prominent than that seen on the previous day. A pericardial drain is noted overlying the cardiac shadow  inferiorly. . A pacing device is again seen. Increasing left-sided pleural effusion is noted. The right lung shows a small effusion posteriorly. No acute bony abnormality is noted.  IMPRESSION: Bilateral pleural effusions left greater the right. The left effusion has increased somewhat in the interval from the prior exam.  Status post pericardial drain with decrease in size of the cardiac shadow.   Electronically Signed   By: Mark  Lukens M.D.   On: 06/12/2013 09:03   Ct Chest Wo Contrast 06/12/2013   CLINICAL DATA:  Status post pacemaker placement January, 2015. Hemopericardium. Pericardial drainage catheter in place.  EXAM: CT CHEST WITHOUT CONTRAST  TECHNIQUE: Multidetector CT imaging of the chest was performed following the standard protocol without IV contrast.  COMPARISON:  Plain film of the chest 06/11/2013 and 06/07/2013.  FINDINGS: Dual lead pacing device is in place. The right ventricular lead penetrates the anterior wall of the myocardium and is in epicardial fat. The patient has a pericardial drainage catheter in place. There is a small to moderate amount of hemopericardium. The right atrial lead appears to be in good position. Small to moderate simple bilateral pleural effusions are present, larger on the left. No axillary, hilar or mediastinal lymphadenopathy. Lungs demonstrate compressive atelectasis   in the lower lung zones, much worse on the left. Visualized upper abdomen shows a 1.1 cm hypoattenuating lesion the right hepatic lobe likely representing a cyst. Partial visualization of a 0.7 cm hyper attenuating lesion the upper pole the right kidney likely representing a complex cyst. No focal bony abnormality identified.  IMPRESSION: Pacing device in place. The right ventricular lead penetrates the myocardium. There is associated hemopericardium with a drain in place.  Small to moderate bilateral pleural effusions, larger on the left. Associated compressive atelectasis is much worse than left.   Findings discussed with Dr. Doylene Canard.   Electronically Signed   By: Inge Rise M.D.   On: 06/12/2013 19:16   ASSESSMENT AND PLAN:  Active Problems:   Pericardial effusion with cardiac tamponade  1. RV lead perforation/ complete heart block She will require lead revision today.  I would recommend that we keep the pericardial drain in place until afterwards. Risks, benefits, and alternatives to lead revision were discussed in detail with the patient who wishes to proceed.

## 2013-06-13 NOTE — Progress Notes (Signed)
  Echocardiogram 2D Echocardiogram (limited) has been performed.  Agency, [place] 06/13/2013, 8:45 AM

## 2013-06-13 NOTE — Care Management Note (Addendum)
    Page 1 of 2   06/20/2013     10:38:01 AM   CARE MANAGEMENT NOTE 06/20/2013  Patient:  Theresa Norton, Theresa Norton   Account Number:  [id number]  Date Initiated:  06/12/2013  Documentation initiated by:  Elissa Hefty  Subjective/Objective Assessment:   adm w pericardial effuison     Action/Plan:   lives w husband, pcp dr Darnell Level [place], act w [facility]   Anticipated DC Date:  06/21/2013   Anticipated DC Plan:  [place]  CM consult      [facility] Choice  Resumption Of Svcs/PTA Provider   Choice offered to / List presented to:     DME arranged  [facility]      DME agency  [place] arranged  HH-1 RN  [place].   Status of service:  Completed, signed off Medicare Important Message given?   (If response is "NO", the following Medicare IM given date fields will be blank) Date Medicare IM given:   Date Additional Medicare IM given:    Discharge Disposition:  [place]  Per UR Regulation:  Reviewed for med. necessity/level of care/duration of stay  If discussed at [place] of Stay Meetings, dates discussed:   06/19/2013    Comments:  06/20/13 Dayan Kreis,RN,BSN [phone] PT [place].  [facility] TO RESUME HH SERVICE AS PRIOR TO ADMISSION.  WILL NOTIFY [place].  PT HYPOXIC WITH SATS DOWN TO 85 YESTERDAY ON RA.  ORDER WRITTEN FOR HOME OXYGEN SET UP.  PER [facility], O2 WILL NOT BE COVERED, AS PT DOES NOT HAVE A CHRONIC RESPIRATORY DIAGNOSIS.  CASE MANAGER EXPLAINED THIS TO PT AND SON; EXPLAINED AS WELL THAT [facility] CERTAINLY WILLING TO ARRANGE O2 FOR PT, BUT THEY WILL HAVE TO PAY OUT OF POCKET.  PT/SON NOT HAPPY ABOUT THIS, BUT UNDERSTAND IT IS PER MEDICARE GUIDELINES, AND WANT PT TO HAVE OXYGEN AS ORDERED BY PHYSICIAN.  NOTIFIED [place] TO PT'S ROOM ASAP; DISCUSS COSTS WITH PT/SON.  2/6 1440 debbie dowell rn,bsn alerted donna w adv homecare  of adm.

## 2013-06-13 NOTE — Progress Notes (Signed)
Subjective:  Revision of RV lead and repositioning of RA lead by Dr. Rayann Heman. Afebrile and stable blood pressure.   Objective:  Vital Signs in the last 24 hours: Temp:  [97.5 F (36.4 C)-98.5 F (36.9 C)] 98.4 F (36.9 C) (02/06 1200) Pulse Rate:  [77-98] 98 (02/06 1715) Cardiac Rhythm:  [-] Ventricular paced (02/06 1604) Resp:  [17-28] 28 (02/06 1715) BP: (97-149)/(51-87) 128/73 mmHg (02/06 1715) SpO2:  [90 %-100 %] 97 % (02/06 1715) Weight:  [72.3 kg (159 lb 6.3 oz)] 72.3 kg (159 lb 6.3 oz) (02/06 0500)  Physical Exam: BP Readings from Last 1 Encounters:  06/13/13 128/73     Wt Readings from Last 1 Encounters:  06/13/13 72.3 kg (159 lb 6.3 oz)    Weight change: -0.5 kg (-1 lb 1.6 oz)  HEENT: [place]/AT, Eyes-[place], PERL, EOMI, Conjunctiva-Pale pink, Sclera-Non-icteric Neck: + JVD, No bruit, Trachea midline. Lungs:  Clear, Bilateral. Cardiac:  Regular rhythm, normal S1 and S2, no S3.  Abdomen:  Soft, non-tender. Extremities:  No edema present. No cyanosis. No clubbing. CNS: AxOx3, Cranial nerves grossly intact, moves all 4 extremities. Right handed.  Skin: Warm and dry.   Intake/Output from previous day: 02/05 0701 - 02/06 0700 In: 923 [P.O.:920; I.V.:3] Out: 2245 [Urine:2175; Drains:70]    Lab Results: BMET    Component Value Date/Time   NA 128* 06/13/2013 0230   K 4.5 06/13/2013 0230   CL 93* 06/13/2013 0230   CO2 20 06/13/2013 0230   GLUCOSE 104* 06/13/2013 0230   BUN 21 06/13/2013 0230   CREATININE 0.88 06/13/2013 0230   CALCIUM 8.2* 06/13/2013 0230   GFRNONAA 60* 06/13/2013 0230   GFRAA 69* 06/13/2013 0230   CBC    Component Value Date/Time   WBC 12.9* 06/13/2013 0230   RBC 3.02* 06/13/2013 0230   HGB 9.8* 06/13/2013 0230   HCT 28.2* 06/13/2013 0230   PLT 546* 06/13/2013 0230   MCV 93.4 06/13/2013 0230   MCH 32.5 06/13/2013 0230   MCHC 34.8 06/13/2013 0230   RDW 13.6 06/13/2013 0230   LYMPHSABS 0.8 06/07/2013 1152   MONOABS 0.9 06/07/2013 1152   EOSABS 0.1 06/07/2013 1152   BASOSABS 0.0  06/07/2013 1152   CARDIAC ENZYMES Lab Results  Component Value Date   TROPONINI <0.30 06/07/2013    Scheduled Meds: .  ceFAZolin (ANCEF) IV  1 g Intravenous Q6H  . colchicine  0.6 mg Oral Daily  . sodium chloride  3 mL Intravenous Q12H   Continuous Infusions:  PRN Meds:.sodium chloride, acetaminophen, ALPRAZolam, ondansetron (ZOFRAN) IV, sodium chloride, traMADol  Assessment/Plan:  Pericardial effusion with cardiac tamponade  Hypertension  Mild native vessel coronary artery disease  Anemia of blood loss and iron deficiency  Anxiety  Weakness  S/P dual chamber pacemaker placement with revision  Continue monitoring.    LOS: 2 days    Dixie Dials  MD  06/13/2013, 6:44 PM

## 2013-06-14 ENCOUNTER — Inpatient Hospital Stay (HOSPITAL_COMMUNITY): Payer: Medicare Other

## 2013-06-14 DIAGNOSIS — I2510 Atherosclerotic heart disease of native coronary artery without angina pectoris: Secondary | ICD-10-CM | POA: Diagnosis not present

## 2013-06-14 DIAGNOSIS — D649 Anemia, unspecified: Secondary | ICD-10-CM | POA: Diagnosis not present

## 2013-06-14 DIAGNOSIS — I442 Atrioventricular block, complete: Secondary | ICD-10-CM | POA: Diagnosis not present

## 2013-06-14 DIAGNOSIS — I314 Cardiac tamponade: Secondary | ICD-10-CM | POA: Diagnosis not present

## 2013-06-14 DIAGNOSIS — I10 Essential (primary) hypertension: Secondary | ICD-10-CM | POA: Diagnosis not present

## 2013-06-14 DIAGNOSIS — J90 Pleural effusion, not elsewhere classified: Secondary | ICD-10-CM | POA: Diagnosis not present

## 2013-06-14 DIAGNOSIS — T82190A Other mechanical complication of cardiac electrode, initial encounter: Secondary | ICD-10-CM | POA: Diagnosis not present

## 2013-06-14 DIAGNOSIS — J189 Pneumonia, unspecified organism: Secondary | ICD-10-CM | POA: Diagnosis not present

## 2013-06-14 DIAGNOSIS — I319 Disease of pericardium, unspecified: Secondary | ICD-10-CM | POA: Diagnosis not present

## 2013-06-14 DIAGNOSIS — I312 Hemopericardium, not elsewhere classified: Secondary | ICD-10-CM | POA: Diagnosis not present

## 2013-06-14 LAB — CBC
HCT: 28.3 % — ABNORMAL LOW (ref 36.0–46.0)
Hemoglobin: 9.7 g/dL — ABNORMAL LOW (ref 12.0–15.0)
MCH: 32 pg (ref 26.0–34.0)
MCHC: 34.3 g/dL (ref 30.0–36.0)
MCV: 93.4 fL (ref 78.0–100.0)
Platelets: 473 10*3/uL — ABNORMAL HIGH (ref 150–400)
RBC: 3.03 MIL/uL — ABNORMAL LOW (ref 3.87–5.11)
RDW: 13.9 % (ref 11.5–15.5)
WBC: 11.6 10*3/uL — ABNORMAL HIGH (ref 4.0–10.5)

## 2013-06-14 LAB — BASIC METABOLIC PANEL
BUN: 20 mg/dL (ref 6–23)
CO2: 21 mEq/L (ref 19–32)
Calcium: 8.2 mg/dL — ABNORMAL LOW (ref 8.4–10.5)
Chloride: 91 mEq/L — ABNORMAL LOW (ref 96–112)
Creatinine, Ser: 0.78 mg/dL (ref 0.50–1.10)
GFR calc Af Amer: 88 mL/min — ABNORMAL LOW (ref >90)
GFR calc non Af Amer: 76 mL/min — ABNORMAL LOW (ref >90)
Glucose, Bld: 105 mg/dL — ABNORMAL HIGH (ref 70–99)
Potassium: 4.4 mEq/L (ref 3.7–5.3)
Sodium: 126 mEq/L — ABNORMAL LOW (ref 137–147)

## 2013-06-14 LAB — BODY FLUID CULTURE: Culture: NO GROWTH

## 2013-06-14 MED ORDER — METOPROLOL TARTRATE 12.5 MG HALF TABLET
12.5000 mg | ORAL_TABLET | Freq: Two times a day (BID) | ORAL | Status: DC
  Administered 2013-06-14 – 2013-06-16 (×5): 12.5 mg via ORAL
  Filled 2013-06-14 (×6): qty 1

## 2013-06-14 NOTE — Progress Notes (Signed)
MD aware of pt's CT results;

## 2013-06-14 NOTE — Op Note (Signed)
NAMECLAUDETTE, Theresa Norton NO.:  [id number]  MEDICAL RECORD NO.:  [id number]  LOCATION:  2H10C                        FACILITY:  [facility]  PHYSICIAN:  Thompson Grayer, MD       DATE OF BIRTH:  January 08, 1929  DATE OF PROCEDURE:  06/13/2013 DATE OF DISCHARGE:                              OPERATIVE REPORT   SURGEON:  Thompson Grayer, MD  PREPROCEDURE DIAGNOSES: 1. Complete heart block. 2. Right ventricular lead perforation with tamponade.  POSTPROCEDURE DIAGNOSES: 1. Complete heart block. 2. Right ventricular lead perforation with tamponade.  PROCEDURES: 1. Removal of an existing right ventricular pacemaker lead with     placement of a new right ventricular pacemaker lead. 2. Repositioning of a previously placed right atrial lead.  INTRODUCTION:  Ms. Byer is a pleasant 80 year old female with a history of complete heart block.  She previously presented on May 27, 2013 with symptomatic complete heart block and underwent pacemaker implantation by me with a Medtronic Adapta L, (serial #[id number]) device implanted.  She did well initially but subsequently returned with symptomatic tamponade physiology and a large pericardial effusion.  Her effusion was drained by Dr. Doylene Canard.  A CT scan suggestive perforation of a right ventricular lead.  She therefore presents now for pacemaker system revision.  DESCRIPTION OF PROCEDURE:  Informed written consent was obtained and the patient was brought to the electrophysiology lab in the fasting state. She was adequately sedated with intravenous Versed and fentanyl as outlined in the nursing report.  The patient's left chest was prepped and draped in the usual sterile fashion by the EP lab staff.  The skin overlying her existing pacemaker was infiltrated with lidocaine for local analgesia.  A 4-cm incision was made over her existing pacemaker. The pocket was opened in a standard fashion using sharp and blunt dissection.   Electrocautery was used to assure hemostasis.  The pocket was evaluated and there was no foreign matter or debris within the pocket.  Fluoroscopic evaluation of the patient's right atrial and right ventricular leads revealed that the right ventricular lead was dislodged.  The right ventricular lead had tension placed on the right atrial lead as well.  The patient had a small pericardial effusion but also on fluoroscopy was noted to have a moderate pleural effusion.  The left axillary vein was cannulated with fluoroscopic visualization using a Seldinger technique.  No contrast was required for this procedure today.  Through the left axillary vein, a Medtronic model [id number] (serial [id number]) lead was advanced with fluoroscopic visualization into the mid septum of the right ventricle.  In this location, R-waves measured 9 mV, with impedance of 800 ohms and a threshold of 1 volt at 0.5 milliseconds.  The existing right ventricular lead was unscrewed from the myocardium and gently removed from the body without any resistance or difficulty.  The previously implanted right atrial lead was a Medtronic, model [id number] lead.  P-waves were noted to be quite variable and ranging from 0.3-2.3 mV.  I felt that was most prudent to go ahead and reposition this lead.  This lead was therefore unfixed from the myocardium unscrewing the active fixation mechanism.  The lead was then repositioned and screwed into  the left atrial appendage.  In this location, P-waves measured 1.5 mV with impedance of 773 ohms and a threshold of 1.2 V at 0.5 milliseconds.  There was at least moderate injury current observed on both leads with implant.  Both leads were secured to the pectoralis fascia with a #2 silk over the suture sleeves. The leads were then connected to the previous Medtronic Adapta L, model [id number] device.  The pocket was irrigated with copious gentamicin solution.  The device was then placed into the  pocket.  The pocket was then closed in two layers with 2-0 Vicryl suture for the subcutaneous and subcuticular layers.  Steri-Strips and a sterile dressing were then applied.  There were no early apparent complications.  CONCLUSIONS: 1. Successful right atrial lead repositioning and replacement of the     previously implanted right ventricular lead with a new Medtronic     model [id number] lead placed along the mid septum of the right ventricle. 2  The patient is noted to have fluoroscopically at least moderate pleural effusion as well as a small pericardial effusion.  The pericardial drain is still in place. 1. No early apparent complications.     Thompson Grayer, MD     JA/MEDQ  D:  06/13/2013  T:  06/14/2013  Job:  [id number]

## 2013-06-14 NOTE — Progress Notes (Signed)
Subjective:  Patient complains of vague left-sided chest pain. Denies any shortness of breath. No significant drainage from pericardial space. Denies cough fever or chills  Objective:  Vital Signs in the last 24 hours: Temp:  [97.5 F (36.4 C)-98.9 F (37.2 C)] 98.9 F (37.2 C) (02/07 0746) Pulse Rate:  [77-108] 99 (02/07 0900) Resp:  [13-31] 19 (02/07 0900) BP: (97-148)/(49-86) 142/63 mmHg (02/07 0900) SpO2:  [92 %-100 %] 96 % (02/07 0900) Weight:  [72.7 kg (160 lb 4.4 oz)] 72.7 kg (160 lb 4.4 oz) (02/07 0500)  Intake/Output from previous day: 02/06 0701 - 02/07 0700 In: 1392.5 [P.O.:980; I.V.:312.5; IV Piggyback:100] Out: 525 [Urine:525] Intake/Output from this shift: Total I/O In: 50 [IV Piggyback:50] Out: -   Physical Exam: Neck: no adenopathy, no carotid bruit, no JVD and supple, symmetrical, trachea midline Lungs: Decreased breath sound left lung field Heart: regular rate and rhythm, S1, S2 normal, no murmur, click, rub or gallop Abdomen: soft, non-tender; bowel sounds normal; no masses,  no organomegaly Extremities: extremities normal, atraumatic, no cyanosis or edema  Lab Results:  Recent Labs  06/13/13 0230 06/14/13 0248  WBC 12.9* 11.6*  HGB 9.8* 9.7*  PLT 546* 473*    Recent Labs  06/13/13 0230 06/14/13 0248  NA 128* 126*  K 4.5 4.4  CL 93* 91*  CO2 20 21  GLUCOSE 104* 105*  BUN 21 20  CREATININE 0.88 0.78   No results found for this basename: TROPONINI, CK, MB,  in the last 72 hours Hepatic Function Panel No results found for this basename: PROT, ALBUMIN, AST, ALT, ALKPHOS, BILITOT, BILIDIR, IBILI,  in the last 72 hours No results found for this basename: CHOL,  in the last 72 hours No results found for this basename: PROTIME,  in the last 72 hours  Imaging: Imaging results have been reviewed and Dg Chest 2 View  06/14/2013   CLINICAL DATA:  Weakness  EXAM: CHEST  2 VIEW  COMPARISON:  06/12/2013  FINDINGS: A pacing device is again seen. Cardiac  shadow remains enlarged. A pericardial drain is again seen. And increasing left-sided pleural effusion is noted as well. Some air bronchograms are seen likely related to some left basilar consolidation.  IMPRESSION: Increasing left basilar infiltrate and effusion.   Electronically Signed   By: Inez Catalina M.D.   On: 06/14/2013 07:21   Ct Chest Wo Contrast  06/12/2013   CLINICAL DATA:  Status post pacemaker placement January, 2015. Hemopericardium. Pericardial drainage catheter in place.  EXAM: CT CHEST WITHOUT CONTRAST  TECHNIQUE: Multidetector CT imaging of the chest was performed following the standard protocol without IV contrast.  COMPARISON:  Plain film of the chest 06/11/2013 and 06/07/2013.  FINDINGS: Dual lead pacing device is in place. The right ventricular lead penetrates the anterior wall of the myocardium and is in epicardial fat. The patient has a pericardial drainage catheter in place. There is a small to moderate amount of hemopericardium. The right atrial lead appears to be in good position. Small to moderate simple bilateral pleural effusions are present, larger on the left. No axillary, hilar or mediastinal lymphadenopathy. Lungs demonstrate compressive atelectasis in the lower lung zones, much worse on the left. Visualized upper abdomen shows a 1.1 cm hypoattenuating lesion the right hepatic lobe likely representing a cyst. Partial visualization of a 0.7 cm hyper attenuating lesion the upper pole the right kidney likely representing a complex cyst. No focal bony abnormality identified.  IMPRESSION: Pacing device in place. The right ventricular lead  penetrates the myocardium. There is associated hemopericardium with a drain in place.  Small to moderate bilateral pleural effusions, larger on the left. Associated compressive atelectasis is much worse than left.  Findings discussed with Dr. Doylene Canard.   Electronically Signed   By: Inge Rise M.D.   On: 06/12/2013 19:16    Cardiac  Studies:  Assessment/Plan:  Status post revision of RV lead for cardiac perforation Status post cardiac temponade status post pericardial drainage Hypertension Mild CAD Status post complete heart block Anemia Left pleural effusion with atelectasis Plan Add low-dose beta blockers Recheck CT of the chest followup hemopericardium/left pleural effusion Will DC pericardial drain if no significant pericardial effusion by CT   LOS: 3 days    Theresa Norton 06/14/2013, 9:56 AM

## 2013-06-14 NOTE — Progress Notes (Signed)
   SUBJECTIVE: The patient is doing well today.  At this time, she denies chest pain, shortness of breath, or any new concerns.  S/p RA and RV lead repositioning yesterday  Device interrogation this morning reviewed  CURRENT MEDICATIONS: .  ceFAZolin (ANCEF) IV  1 g Intravenous Q6H  . colchicine  0.6 mg Oral Daily  . sodium chloride  3 mL Intravenous Q12H      OBJECTIVE: Physical Exam: Filed Vitals:   06/14/13 0400 06/14/13 0500 06/14/13 0600 06/14/13 0700  BP: 115/84 97/53 120/79 131/68  Pulse: 93 93 94 97  Temp: 97.5 F (36.4 C)     TempSrc: Oral     Resp: 17 17 18 19   Height:      Weight:  160 lb 4.4 oz (72.7 kg)    SpO2: 99% 98% 97% 97%    Intake/Output Summary (Last 24 hours) at 06/14/13 0721 Last data filed at 06/14/13 0630  Gross per 24 hour  Intake 1392.5 ml  Output    525 ml  Net  867.5 ml    Telemetry reveals sinus rhythm with ventricular pacing  GEN- The patient is well appearing, alert and oriented x 3 today.   Head- normocephalic, atraumatic Eyes-  Sclera clear, conjunctiva pink Ears- hearing intact Oropharynx- clear Neck- supple, no JVP Lungs- Clear to ausculation on the right, decreased breath sounds half way up on the left, normal work of breathing, no hematoma Heart- Regular rate and rhythm, no murmurs, rubs or gallops, PMI not laterally displaced GI- soft, NT, ND, + BS Extremities- no clubbing, cyanosis, or edema Skin- no rash or lesion Psych- euthymic mood, full affect Neuro- strength and sensation are intact  LABS: Basic Metabolic Panel:  Recent Labs  06/13/13 0230 06/14/13 0248  NA 128* 126*  K 4.5 4.4  CL 93* 91*  CO2 20 21  GLUCOSE 104* 105*  BUN 21 20  CREATININE 0.88 0.78  CALCIUM 8.2* 8.2*   CBC:  Recent Labs  06/13/13 0230 06/14/13 0248  WBC 12.9* 11.6*  HGB 9.8* 9.7*  HCT 28.2* 28.3*  MCV 93.4 93.4  PLT 546* 473*    RADIOLOGY: Dg Chest 2 View 06/12/2013   CLINICAL DATA:  Cardiomegaly  EXAM: CHEST  2 VIEW   COMPARISON:  06/11/2013  FINDINGS: Cardiac shadow is less prominent than that seen on the previous day. A pericardial drain is noted overlying the cardiac shadow inferiorly. . A pacing device is again seen. Increasing left-sided pleural effusion is noted. The right lung shows a small effusion posteriorly. No acute bony abnormality is noted.  IMPRESSION: Bilateral pleural effusions left greater the right. The left effusion has increased somewhat in the interval from the prior exam.  Status post pericardial drain with decrease in size of the cardiac shadow.   Electronically Signed   By: Inez Catalina M.D.   On: 06/12/2013 09:03   CXR this morning - official read pending, looks like the pleural effusion is larger  ASSESSMENT AND PLAN:  Active Problems:   Complete heart block   Pericardial effusion with cardiac tamponade   Displacement of pacemaker electrode lead  Rec: Her PPM is working normally this morning. I am concerned about her pleural effusion which appears to be related to her pericardial effusion. She should probably go for CT scan of the chest though I doubt this represents an empyema. Probably ok for pericardial drain to be removed.  Mikle Bosworth.D.

## 2013-06-15 DIAGNOSIS — D649 Anemia, unspecified: Secondary | ICD-10-CM | POA: Diagnosis not present

## 2013-06-15 DIAGNOSIS — I2510 Atherosclerotic heart disease of native coronary artery without angina pectoris: Secondary | ICD-10-CM | POA: Diagnosis not present

## 2013-06-15 DIAGNOSIS — I442 Atrioventricular block, complete: Secondary | ICD-10-CM | POA: Diagnosis not present

## 2013-06-15 DIAGNOSIS — I10 Essential (primary) hypertension: Secondary | ICD-10-CM | POA: Diagnosis not present

## 2013-06-15 MED ORDER — HYDROCOD POLST-CHLORPHEN POLST 10-8 MG/5ML PO LQCR
5.0000 mL | Freq: Four times a day (QID) | ORAL | Status: DC | PRN
  Administered 2013-06-15 – 2013-06-16 (×2): 5 mL via ORAL
  Administered 2013-06-16: 2.5 mL via ORAL
  Administered 2013-06-17 – 2013-06-18 (×3): 5 mL via ORAL
  Filled 2013-06-15 (×7): qty 5

## 2013-06-15 NOTE — Progress Notes (Signed)
Subjective:  Patient complaints of dry cough denies any fever or chills. Also complains of soreness at pacer site dressing is dry. Repeat CT of the chest showed small to moderate size pericardial effusion which is stable.  Objective:  Vital Signs in the last 24 hours: Temp:  [98.2 F (36.8 C)-98.8 F (37.1 C)] 98.4 F (36.9 C) (02/07 2000) Pulse Rate:  [58-105] 58 (02/08 0800) Resp:  [14-27] 19 (02/08 0800) BP: (75-164)/(57-98) 113/64 mmHg (02/08 0800) SpO2:  [90 %-99 %] 99 % (02/08 0800) Weight:  [72.9 kg (160 lb 11.5 oz)] 72.9 kg (160 lb 11.5 oz) (02/08 0500)  Intake/Output from previous day: 02/07 0701 - 02/08 0700 In: 31 [P.O.:840; IV Piggyback:50] Out: 800 [Urine:800] Intake/Output from this shift: Total I/O In: 240 [P.O.:240] Out: -   Physical Exam: Neck: no adenopathy, no carotid bruit, no JVD and supple, symmetrical, trachea midline Lungs: Decreased breath sounds at left base air entry is improved Heart: regular rate and rhythm, S1, S2 normal, no murmur, click, rub or gallop Abdomen: soft, non-tender; bowel sounds normal; no masses,  no organomegaly Extremities: extremities normal, atraumatic, no cyanosis or edema  Lab Results:  Recent Labs  06/13/13 0230 06/14/13 0248  WBC 12.9* 11.6*  HGB 9.8* 9.7*  PLT 546* 473*    Recent Labs  06/13/13 0230 06/14/13 0248  NA 128* 126*  K 4.5 4.4  CL 93* 91*  CO2 20 21  GLUCOSE 104* 105*  BUN 21 20  CREATININE 0.88 0.78   No results found for this basename: TROPONINI, CK, MB,  in the last 72 hours Hepatic Function Panel No results found for this basename: PROT, ALBUMIN, AST, ALT, ALKPHOS, BILITOT, BILIDIR, IBILI,  in the last 72 hours No results found for this basename: CHOL,  in the last 72 hours No results found for this basename: PROTIME,  in the last 72 hours  Imaging: Imaging results have been reviewed and Dg Chest 2 View  06/14/2013   CLINICAL DATA:  Weakness  EXAM: CHEST  2 VIEW  COMPARISON:  06/12/2013   FINDINGS: A pacing device is again seen. Cardiac shadow remains enlarged. A pericardial drain is again seen. And increasing left-sided pleural effusion is noted as well. Some air bronchograms are seen likely related to some left basilar consolidation.  IMPRESSION: Increasing left basilar infiltrate and effusion.   Electronically Signed   By: Inez Catalina M.D.   On: 06/14/2013 07:21   Ct Chest Wo Contrast  06/14/2013   CLINICAL DATA:  CXR with bilateral pleural effusions, Patient is 1 day Post-op RA and RV lead repositioning with pericardial drain- reassess for pericardial/pleural effusions  EXAM: CT CHEST WITHOUT CONTRAST  TECHNIQUE: Multidetector CT imaging of the chest was performed following the standard protocol without IV contrast.  COMPARISON:  DG CHEST 2 VIEW dated 06/14/2013; CT CHEST W/O CM dated 06/12/2013  FINDINGS: The thoracic inlet is unremarkable.  Multichamber cardiac enlargement is appreciated. The dual-chambered cardiac pacing unit leads are appreciated projecting in the region of the right atrium and right ventricle. Evaluation is otherwise limited secondary to cardiac motion. Pericardial drain is again appreciated and unchanged. The small to moderate pericardial effusion unchanged. There is no evidence of mediastinal adenopathy nor mass is appreciated within the limitations of a non contrasted CT.  There has been slight increase sized patient's left pleural effusion. Again appearing to be a small to moderate in size. A small right pleural effusion is identified which is also slightly increased in size.  The  lung parenchyma demonstrates areas of compressive atelectasis within the bases adjacent to the previously described effusions. Otherwise no focal regions of consolidation appreciated.  The visualized upper abdominal viscera demonstrate a stable low attenuating 1 cm focus within the dome of right lobe of the liver likely reflecting a cyst. Remaining visualized upper abdominal viscera are  unremarkable.  Atherosclerotic calcification identified within the thoracic and visualized portion of the abdominal aorta. Small punctate coronary artery calcifications project within the left coronary artery.  IMPRESSION: 1. Slight increased size of the patient's bilateral effusions moderate on the left, small on the right 2. Stable pericardial effusion. 3. Stable findings otherwise within the chest. Atherosclerotic calcifications are appreciated.   Electronically Signed   By: Margaree Mackintosh M.D.   On: 06/14/2013 10:52    Cardiac Studies:  Assessment/Plan:  Status post revision of RV lead for cardiac perforation  Status post cardiac temponade status post pericardial drainage  Hypertension  Mild CAD  Status post complete heart block  Anemia  Left pleural effusion with atelectasis Plan Continue present management May need limited 2-D echo before removing pericardial drain. Discussed with patient and her son. Dr. Doylene Canard will follow in a.m.  LOS: 4 days    Jakeya Gherardi N 06/15/2013, 10:44 AM

## 2013-06-16 ENCOUNTER — Encounter (HOSPITAL_COMMUNITY)
Admission: EM | Disposition: A | Discharge status: Home Health | Payer: Self-pay | Source: Home / Self Care | Attending: Cardiovascular Disease

## 2013-06-16 ENCOUNTER — Inpatient Hospital Stay (HOSPITAL_COMMUNITY): Payer: Medicare Other

## 2013-06-16 DIAGNOSIS — J189 Pneumonia, unspecified organism: Secondary | ICD-10-CM | POA: Diagnosis not present

## 2013-06-16 DIAGNOSIS — I442 Atrioventricular block, complete: Secondary | ICD-10-CM | POA: Diagnosis not present

## 2013-06-16 DIAGNOSIS — T82190A Other mechanical complication of cardiac electrode, initial encounter: Principal | ICD-10-CM

## 2013-06-16 DIAGNOSIS — I312 Hemopericardium, not elsewhere classified: Secondary | ICD-10-CM | POA: Diagnosis not present

## 2013-06-16 DIAGNOSIS — I319 Disease of pericardium, unspecified: Secondary | ICD-10-CM | POA: Diagnosis not present

## 2013-06-16 DIAGNOSIS — R030 Elevated blood-pressure reading, without diagnosis of hypertension: Secondary | ICD-10-CM

## 2013-06-16 DIAGNOSIS — J158 Pneumonia due to other specified bacteria: Secondary | ICD-10-CM | POA: Diagnosis not present

## 2013-06-16 DIAGNOSIS — J90 Pleural effusion, not elsewhere classified: Secondary | ICD-10-CM | POA: Diagnosis not present

## 2013-06-16 DIAGNOSIS — I314 Cardiac tamponade: Secondary | ICD-10-CM | POA: Diagnosis not present

## 2013-06-16 DIAGNOSIS — R899 Unspecified abnormal finding in specimens from other organs, systems and tissues: Secondary | ICD-10-CM | POA: Diagnosis not present

## 2013-06-16 DIAGNOSIS — I2510 Atherosclerotic heart disease of native coronary artery without angina pectoris: Secondary | ICD-10-CM | POA: Diagnosis not present

## 2013-06-16 DIAGNOSIS — I4891 Unspecified atrial fibrillation: Secondary | ICD-10-CM | POA: Diagnosis not present

## 2013-06-16 DIAGNOSIS — Z950 Presence of cardiac pacemaker: Secondary | ICD-10-CM | POA: Diagnosis not present

## 2013-06-16 DIAGNOSIS — Z930 Tracheostomy status: Secondary | ICD-10-CM | POA: Diagnosis not present

## 2013-06-16 LAB — BODY FLUID CELL COUNT WITH DIFFERENTIAL
Lymphs, Fluid: 62 %
Monocyte-Macrophage-Serous Fluid: 16 % — ABNORMAL LOW (ref 50–90)
Neutrophil Count, Fluid: 22 % (ref 0–25)
Total Nucleated Cell Count, Fluid: 835 cu mm (ref 0–1000)

## 2013-06-16 LAB — LACTATE DEHYDROGENASE, PLEURAL OR PERITONEAL FLUID: LD, Fluid: 227 U/L — ABNORMAL HIGH (ref 3–23)

## 2013-06-16 LAB — LACTATE DEHYDROGENASE: LDH: 313 U/L — ABNORMAL HIGH (ref 94–250)

## 2013-06-16 LAB — PROTEIN, BODY FLUID: Total protein, fluid: 4 g/dL

## 2013-06-16 LAB — PROTEIN, TOTAL: Total Protein: 6.6 g/dL (ref 6.0–8.3)

## 2013-06-16 SURGERY — PERICARDIAL TAP
Anesthesia: LOCAL

## 2013-06-16 MED ORDER — FUROSEMIDE 10 MG/ML IJ SOLN
40.0000 mg | Freq: Once | INTRAMUSCULAR | Status: AC
  Administered 2013-06-16: 40 mg via INTRAVENOUS
  Filled 2013-06-16: qty 4

## 2013-06-16 MED ORDER — METOPROLOL TARTRATE 1 MG/ML IV SOLN
INTRAVENOUS | Status: AC
  Filled 2013-06-16: qty 5

## 2013-06-16 MED ORDER — METOPROLOL TARTRATE 25 MG PO TABS
25.0000 mg | ORAL_TABLET | Freq: Two times a day (BID) | ORAL | Status: DC
  Administered 2013-06-16 – 2013-06-19 (×7): 25 mg via ORAL
  Filled 2013-06-16 (×10): qty 1

## 2013-06-16 MED ORDER — METOPROLOL TARTRATE 1 MG/ML IV SOLN
5.0000 mg | Freq: Once | INTRAVENOUS | Status: AC
  Administered 2013-06-16: 5 mg via INTRAVENOUS

## 2013-06-16 MED ORDER — DIGOXIN 0.25 MG/ML IJ SOLN
0.2500 mg | Freq: Every day | INTRAMUSCULAR | Status: AC
  Administered 2013-06-16: 0.25 mg via INTRAVENOUS
  Filled 2013-06-16: qty 1

## 2013-06-16 MED ORDER — DILTIAZEM HCL 30 MG PO TABS
30.0000 mg | ORAL_TABLET | Freq: Four times a day (QID) | ORAL | Status: DC
  Administered 2013-06-16 (×2): 30 mg via ORAL
  Filled 2013-06-16 (×7): qty 1

## 2013-06-16 NOTE — Progress Notes (Addendum)
Patient: Theresa Norton Date of Encounter: 06/16/2013, 7:06 AM Admit date: 06/11/2013     Subjective  Ms. Patchin reports persistent cough and mild soreness at implant site. She denies CP or SOB.   Objective  Physical Exam: Vitals: BP 134/74  Pulse 84  Temp(Src) 98.5 F (36.9 C) (Oral)  Resp 20  Ht 5\' 3"  (1.6 m)  Wt 159 lb 6.3 oz (72.3 kg)  BMI 28.24 kg/m2  SpO2 93% General: Well developed, well appearing 80 year old female in no acute distress. Neck: Supple. JVD not elevated. Lungs: Decreased breath sounds on left. Otherwise clear bilaterally to auscultation without wheezes, rales, or rhonchi. Breathing is unlabored. Heart: RRR S1 S2 without murmurs, rubs, or gallops.  Abdomen: Soft, non-distended. Extremities: No clubbing or cyanosis. No edema.   Neuro: Alert and oriented X 3. Moves all extremities spontaneously. No focal deficits.  Intake/Output:  Intake/Output Summary (Last 24 hours) at 06/16/13 0706 Last data filed at 06/15/13 2000  Gross per 24 hour  Intake    840 ml  Output   1075 ml  Net   -235 ml    Inpatient Medications:  . colchicine  0.6 mg Oral Daily  . metoprolol tartrate  12.5 mg Oral BID  . sodium chloride  3 mL Intravenous Q12H    Labs:  Recent Labs  06/14/13 0248  NA 126*  K 4.4  CL 91*  CO2 21  GLUCOSE 105*  BUN 20  CREATININE 0.78  CALCIUM 8.2*    Recent Labs  06/14/13 0248  WBC 11.6*  HGB 9.7*  HCT 28.3*  MCV 93.4  PLT 473*    Radiology/Studies: Dg Chest 2 View  06/16/2013   CLINICAL DATA:  Follow-up effusion.  EXAM: CHEST  2 VIEW  COMPARISON:  Chest radiograph June 14, 2013.  FINDINGS: Stable appearance of moderate left and small right pleural effusions. Left lung base consolidation is similar may reflect atelectasis.  The cardiac silhouette is predominantly obscured by the effusions. Mediastinal silhouette is nonsuspicious. Dual lead left cardiac pacemaker in situ. Small bore left lung base versus pericardial drainage  catheter. No pneumothorax.  Multiple EKG lines overlie the patient and may obscure subtle underlying pathology. Soft tissue planes and included osseous structures are nonsuspicious.  IMPRESSION: Stable moderate left and small right pleural effusions with left lung base versus pericardial drainage catheter in place.   Electronically Signed   By: Elon Alas   On: 06/16/2013 04:45   Dg Chest 2 View  06/14/2013   CLINICAL DATA:  Weakness  EXAM: CHEST  2 VIEW  COMPARISON:  06/12/2013  FINDINGS: A pacing device is again seen. Cardiac shadow remains enlarged. A pericardial drain is again seen. And increasing left-sided pleural effusion is noted as well. Some air bronchograms are seen likely related to some left basilar consolidation.  IMPRESSION: Increasing left basilar infiltrate and effusion.   Electronically Signed   By: Inez Catalina M.D.   On: 06/14/2013 07:21   Ct Chest Wo Contrast  06/14/2013   CLINICAL DATA:  CXR with bilateral pleural effusions, Patient is 1 day Post-op RA and RV lead repositioning with pericardial drain- reassess for pericardial/pleural effusions  EXAM: CT CHEST WITHOUT CONTRAST  TECHNIQUE: Multidetector CT imaging of the chest was performed following the standard protocol without IV contrast.  COMPARISON:  DG CHEST 2 VIEW dated 06/14/2013; CT CHEST W/O CM dated 06/12/2013  FINDINGS: The thoracic inlet is unremarkable.  Multichamber cardiac enlargement is appreciated. The dual-chambered cardiac pacing  unit leads are appreciated projecting in the region of the right atrium and right ventricle. Evaluation is otherwise limited secondary to cardiac motion. Pericardial drain is again appreciated and unchanged. The small to moderate pericardial effusion unchanged. There is no evidence of mediastinal adenopathy nor mass is appreciated within the limitations of a non contrasted CT.  There has been slight increase sized patient's left pleural effusion. Again appearing to be a small to moderate in  size. A small right pleural effusion is identified which is also slightly increased in size.  The lung parenchyma demonstrates areas of compressive atelectasis within the bases adjacent to the previously described effusions. Otherwise no focal regions of consolidation appreciated.  The visualized upper abdominal viscera demonstrate a stable low attenuating 1 cm focus within the dome of right lobe of the liver likely reflecting a cyst. Remaining visualized upper abdominal viscera are unremarkable.  Atherosclerotic calcification identified within the thoracic and visualized portion of the abdominal aorta. Small punctate coronary artery calcifications project within the left coronary artery.  IMPRESSION: 1. Slight increased size of the patient's bilateral effusions moderate on the left, small on the right 2. Stable pericardial effusion. 3. Stable findings otherwise within the chest. Atherosclerotic calcifications are appreciated.   Electronically Signed   By: Margaree Mackintosh M.D.   On: 06/14/2013 10:52    Telemetry: A sensed V paced   Assessment and Plan   1. RV lead perforation with pericardial effusion and tamponade, now s/p pericardiocentesis and RV lead revision - repeat chest CT showed small to moderate size pericardial effusion which is stable - has left pleural effusion with atelectasis  2. Complete heart block s/p recent PPM implant - normal device function by interrogation 06/14/2013  Signed, EDMISTEN, BROOKE PA-C  I have seen, examined the patient, and reviewed the above assessment and plan.  Changes to above are made where necessary.  The patient has a persistent left pleural effusion which is moderate (1/2 way up on my exam today) and a small R sided effusion.  Her pericardial effusion by limited echo performed at bedside by me this am reveals a small pericardial effusion without any evidence of tamponade.  This small effusion is unchanged from Friday.  There is no significant drainage from  her pericardial drain.  I have therefore removed the drain this am.  Could consider therapeutic thoracentesis (I have encouraged) though the patient would prefer a more conservative approach for now.  I would advise that we avoid anticoagulation at this time.  PPM function is normal  Transfer to TCU today.  Hopefully to telemetry tomorrow PT consult  Co Sign: Thompson Grayer, MD 06/16/2013 8:39 AM

## 2013-06-16 NOTE — Procedures (Signed)
Thoracentesis Procedure Note  Pre-operative Diagnosis: Left Pleural Effusion   Post-operative Diagnosis: same  Indications: Diagnostic & Therapeutic Evaluation of Pleural Fluid  Procedure Details  Consent: Informed consent was obtained. Risks of the procedure were discussed including: infection, bleeding, pain, pneumothorax.  Under sterile conditions the patient was positioned. Betadine solution and sterile drapes were utilized.  1% buffered lidocaine was used to anesthetize the 8th rib space. Fluid was obtained without any difficulties and minimal blood loss.  A dressing was applied to the wound and wound care instructions were provided.   Findings 1050 ml of clear pleural fluid was obtained. A sample was sent to Pathology for cytogenetics, flow, and cell counts, as well as for infection analysis.  Complications:  None; patient tolerated the procedure well.          Condition: stable  Plan A follow up chest x-ray was ordered.   Attending Attestation: I was present and scrubbed for the entire procedure.   Procedure performed under direct supervision of Dr. Titus Mould and with ultrasound guidance for real time vessel cannulation.      Noe Gens, NP-C [facility] Pgr: [phone] or [phone]   Appeared transudative Tolerated well  US guidance  Lavon Paganini. Titus Mould, MD, [place] Pgr: [facility]

## 2013-06-16 NOTE — Consult Note (Signed)
Name: Theresa Norton MRN: [id number] DOB: [birth year]    ADMISSION DATE:  06/11/2013 CONSULTATION DATE:  06/16/13  REFERRING MD :  Dr. Doylene Canard PRIMARY SERVICE:  Dr. Doylene Canard  CHIEF COMPLAINT:  Pleural Effusion  BRIEF PATIENT DESCRIPTION: 80 y/o F admitted 2/4 with increased cough & SOB.  ECHO revealed a large pericardial effusion with tamponade.  Recent pacemaker revision for complete heart block (1/20).   SIGNIFICANT EVENTS / STUDIES:  2/4 - admit with cough & SOB, found to have large pericardial effusion with tamponade 2/9 - PCCM consult for pleural effusion, thora with 1.2L clear yellow fluid removed   CULTURES: Pericardial Culture 2/4>>>neg Pericardial AFB 2/4>>> Pleural fluid 2/9>>>  ANTIBIOTICS:   HISTORY OF PRESENT ILLNESS:  80 y/o F with PMH of HTN, PAF, CAD admitted 2/4 with increased cough & SOB.  Patient recently underwent a revision of pacemaker (1/20) during an admission for complete heart block.  She was evaluated with ECHO on admit which noted a large pericardial effusion with tamponade.  Pericardicentesis was performed with 820 cc of bloody fluid removed per Dr. Doylene Canard.   She underwent revision of R ventricular pacemaker lead.  Under fluoroscopy on 2/7, pericardial effusion noted to be small.  CT Scan on 2/7 revealed increase in bilateral pleural effusion L>R and stable pericardial effusion.  PCCM consulted for pleural effusion evaluation.    PAST MEDICAL HISTORY :  Past Medical History  Diagnosis Date  . Arthritis   . Allergy   . Hypertension   . Colon polyps   . Heart block AV second degree     a. s/p MDT Addapta dual chamber pacemaker 05/2013 by Dr Rayann Heman.  Marland Kitchen PAF (paroxysmal atrial fibrillation)     a. Found post-pacer 05/2013->Xarelto added;  b. 05/2013 Echo: EF 60-65%, mild LVH, nl wall motion w/o rwma.  . Groin hematoma     a. 05/2013 R groin hematoma post-cath - u/s 05/31/13 large 7.49 cm r inguinal hematoma extending into thigh, no psa or avf.  . Anemia     . CAD (coronary artery disease)     a. 05/2013 nonobs dzs by cath.   Past Surgical History  Procedure Laterality Date  . Breast surgery  1959    cyst removal  . Bunionectomy  1967    both feet  . Pacemaker insertion  05/27/2013    MDT [id number] implanted by Dr Rayann Heman for heart block   Prior to Admission medications   Medication Sig Start Date End Date Taking? Authorizing Provider  acetaminophen (TYLENOL) 325 MG tablet Take 325 mg by mouth every 6 (six) hours as needed for mild pain.   Yes Historical Provider, MD  ALPRAZolam (XANAX) 0.25 MG tablet Take 1 tablet (0.25 mg total) by mouth 2 (two) times daily as needed for anxiety. 05/29/13  Yes Birdie Riddle, MD  losartan (COZAAR) 100 MG tablet Take 1 tablet (100 mg total) by mouth daily. 01/20/13  Yes Eulas Post, MD  metoprolol tartrate (LOPRESSOR) 50 MG tablet Take 1 tablet (50 mg total) by mouth 2 (two) times daily. 06/07/13  Yes Rogelia Mire, NP  Rivaroxaban (XARELTO) 20 MG TABS tablet Take 1 tablet (20 mg total) by mouth daily with supper. 06/05/13  Yes Evans Lance, MD   Allergies  Allergen Reactions  . Codeine     GI upset  . Morphine And Related     GI upset    FAMILY HISTORY:  Family History  Problem Relation Age of Onset  .  Cancer Father     lung   SOCIAL HISTORY:  reports that she has never smoked. She does not have any smokeless tobacco history on file. She reports that she drinks alcohol. She reports that she does not use illicit drugs.  REVIEW OF SYSTEMS:   Constitutional: Negative for fever, chills, weight loss, malaise/fatigue and diaphoresis.  HENT: Negative for hearing loss, ear pain, nosebleeds, congestion, sore throat, neck pain, tinnitus and ear discharge.   Eyes: Negative for blurred vision, double vision, photophobia, pain, discharge and redness.  Respiratory: Negative for cough, hemoptysis, sputum production, wheezing and stridor.  SOB + Cardiovascular: Negative for chest pain, palpitations,  orthopnea, claudication, leg swelling and PND.  Gastrointestinal: Negative for heartburn, nausea, vomiting, abdominal pain, diarrhea, constipation, blood in stool and melena.  Genitourinary: Negative for dysuria, urgency, frequency, hematuria and flank pain.  Musculoskeletal: Negative for myalgias, back pain, joint pain and falls.  Skin: Negative for itching and rash.  Neurological: Negative for dizziness, tingling, tremors, sensory change, speech change, focal weakness, seizures, loss of consciousness, weakness and headaches.  Endo/Heme/Allergies: Negative for environmental allergies and polydipsia. Does not bruise/bleed easily.  SUBJECTIVE:   VITAL SIGNS: Temp:  [97.7 F (36.5 C)-98.6 F (37 C)] 98 F (36.7 C) (02/09 0800) Pulse Rate:  [59-95] 91 (02/09 1330) Resp:  [12-25] 19 (02/09 1330) BP: (128-170)/(68-85) 157/75 mmHg (02/09 1200) SpO2:  [88 %-100 %] 98 % (02/09 1330) Weight:  [159 lb 6.3 oz (72.3 kg)] 159 lb 6.3 oz (72.3 kg) (02/09 0500)  PHYSICAL EXAMINATION: General:  wdwn elderly female in NAD Neuro:  AAOx4, speech clear, MAE HEENT:  Mm pink/moist,  No jvd Cardiovascular:  s1s2 rrr, no m/r/g Lungs:  resp's even/non-labored, diminished on L Abdomen:  Round/soft, bsx4 active Musculoskeletal:  No acute deformities  Skin:  Warm/dry, no edema   Recent Labs Lab 06/12/13 0240 06/13/13 0230 06/14/13 0248  NA 128* 128* 126*  K 4.4 4.5 4.4  CL 93* 93* 91*  CO2 18* 20 21  BUN 26* 21 20  CREATININE 1.29* 0.88 0.78  GLUCOSE 93 104* 105*    Recent Labs Lab 06/12/13 0240 06/13/13 0230 06/14/13 0248  HGB 9.2* 9.8* 9.7*  HCT 26.8* 28.2* 28.3*  WBC 9.6 12.9* 11.6*  PLT 492* 546* 473*   Dg Chest 2 View  06/16/2013   CLINICAL DATA:  Follow-up effusion.  EXAM: CHEST  2 VIEW  COMPARISON:  Chest radiograph June 14, 2013.  FINDINGS: Stable appearance of moderate left and small right pleural effusions. Left lung base consolidation is similar may reflect atelectasis.  The  cardiac silhouette is predominantly obscured by the effusions. Mediastinal silhouette is nonsuspicious. Dual lead left cardiac pacemaker in situ. Small bore left lung base versus pericardial drainage catheter. No pneumothorax.  Multiple EKG lines overlie the patient and may obscure subtle underlying pathology. Soft tissue planes and included osseous structures are nonsuspicious.  IMPRESSION: Stable moderate left and small right pleural effusions with left lung base versus pericardial drainage catheter in place.   Electronically Signed   By: Elon Alas   On: 06/16/2013 04:45    ASSESSMENT / PLAN:  Bilateral Pleural Effusion L>R - etiology likely related to AFIB & tamponade  Plan: -assess bedside with ultrasound for thoracentesis -send pleural studies for evaluation  -f/u cxr post procedure and in am  -keep out of fib as much as able -favor neg baalnce  Family updated  I have fully examined this patient and agree with above findings.  And edited in full    Noe Gens, NP-C [place] Pgr: [phone] or [phone]  06/16/2013, 3:05 PM

## 2013-06-16 NOTE — Progress Notes (Signed)
Patient back in a flutter. Dr. Doylene Canard notified.

## 2013-06-16 NOTE — Progress Notes (Signed)
Pt noted to be out of flutter, in V paced regular rhythm.

## 2013-06-16 NOTE — Progress Notes (Signed)
Subjective:  Had paroxysmal Atrial flutter with 5:1 AV conduction. No chest pain or dizziness. Back to sinus rhythm. Moderate left pleural effusion. Pericardial drain pulled by Dr. Rayann Heman. Afebrile. No AFB seen on pericardial fluid smear.  Objective:  Vital Signs in the last 24 hours: Temp:  [97.7 F (36.5 C)-98.6 F (37 C)] 98 F (36.7 C) (02/09 0800) Pulse Rate:  [59-95] 95 (02/09 1230) Cardiac Rhythm:  [-] Ventricular paced (02/09 1230) Resp:  [12-25] 21 (02/09 1230) BP: (128-170)/(68-85) 157/75 mmHg (02/09 1200) SpO2:  [88 %-100 %] 93 % (02/09 1230) Weight:  [72.3 kg (159 lb 6.3 oz)] 72.3 kg (159 lb 6.3 oz) (02/09 0500)  Physical Exam: BP Readings from Last 1 Encounters:  06/16/13 157/75     Wt Readings from Last 1 Encounters:  06/16/13 72.3 kg (159 lb 6.3 oz)    Weight change: -0.6 kg (-1 lb 5.2 oz)  HEENT: [place]/AT, Eyes-Hazel, PERL, EOMI, Conjunctiva-Pale pink, Sclera-Non-icteric Neck: No JVD, No bruit, Trachea midline. Lungs:  Clear with left sided dullness. Cardiac:  Regular rhythm, normal S1 and S2, no S3. Pericardial rub heard in supine position.  Abdomen:  Soft, non-tender. Extremities:  No edema present. No cyanosis. No clubbing. CNS: AxOx3, Cranial nerves grossly intact, moves all 4 extremities. Right handed. Skin: Warm and dry.   Intake/Output from previous day: 02/08 0701 - 02/09 0700 In: 840 [P.O.:840] Out: 1075 [Urine:1075]    Lab Results: BMET    Component Value Date/Time   NA 126* 06/14/2013 0248   K 4.4 06/14/2013 0248   CL 91* 06/14/2013 0248   CO2 21 06/14/2013 0248   GLUCOSE 105* 06/14/2013 0248   BUN 20 06/14/2013 0248   CREATININE 0.78 06/14/2013 0248   CALCIUM 8.2* 06/14/2013 0248   GFRNONAA 76* 06/14/2013 0248   GFRAA 88* 06/14/2013 0248   CBC    Component Value Date/Time   WBC 11.6* 06/14/2013 0248   RBC 3.03* 06/14/2013 0248   HGB 9.7* 06/14/2013 0248   HCT 28.3* 06/14/2013 0248   PLT 473* 06/14/2013 0248   MCV 93.4 06/14/2013 0248   MCH 32.0 06/14/2013 0248    MCHC 34.3 06/14/2013 0248   RDW 13.9 06/14/2013 0248   LYMPHSABS 0.8 06/07/2013 1152   MONOABS 0.9 06/07/2013 1152   EOSABS 0.1 06/07/2013 1152   BASOSABS 0.0 06/07/2013 1152   CARDIAC ENZYMES Lab Results  Component Value Date   TROPONINI <0.30 06/07/2013    Scheduled Meds: . colchicine  0.6 mg Oral Daily  . diltiazem  30 mg Oral QID  . metoprolol tartrate  25 mg Oral BID  . sodium chloride  3 mL Intravenous Q12H   Continuous Infusions:  PRN Meds:.sodium chloride, acetaminophen, ALPRAZolam, chlorpheniramine-HYDROcodone, ondansetron (ZOFRAN) IV, sodium chloride, traMADol  Assessment/Plan: Pericardial effusion with cardiac tamponade S/P Pericardiocentesis  Hypertension  Mild native vessel coronary artery disease  Anemia of blood loss and iron deficiency  Anxiety  Weakness  S/P dual chamber pacemaker placement with revision Paroxysmal atrial flutter Moderate Left pleural effusion  CCM consult for thoracentesis. Dr. Titus Mould notified. Add diltiazem for heart rate control. IV lasix one dose. CBC,CMET in AM.   LOS: 5 days    Dixie Dials  MD  06/16/2013, 1:34 PM

## 2013-06-16 NOTE — Progress Notes (Signed)
Upon entering pt's room, pt's rhythm appeared to be in aflutter, paced, new onset. EKG obtained and MD aware; no new orders at this time

## 2013-06-16 NOTE — Evaluation (Signed)
Physical Therapy Evaluation Patient Details Name: Theresa Norton MRN: [id number] DOB: [birth year] Today's Date: 06/16/2013 Time: 6606-3016 PT Time Calculation (min): 24 min  PT Assessment / Plan / Recommendation History of Present Illness  80 year old female with recent pacemaker placement for complete heart block has left sided chest pain increased with breathing and cough. Echocardiogram revealed large pericardial effusion with tamponade.s/p drainage of effusion and replacement of pacemaker lead  Clinical Impression  Pt familiar from prior admission and demonstrates decreased activity tolerance, mobility and transfers and will benefit from acute therapy to maximize mobility, increase gait and function all to decrease burden of care. Recommend daily mobility with nursing assist.     PT Assessment  Patient needs continued PT services    Follow Up Recommendations  Home health PT;Supervision for mobility/OOB    Does the patient have the potential to tolerate intense rehabilitation      Barriers to Discharge        Equipment Recommendations  None recommended by PT    Recommendations for Other Services     Frequency Min 3X/week    Precautions / Restrictions Precautions Precautions: Fall;ICD/Pacemaker   Pertinent Vitals/Pain HR 65-72 BP 157/75 sitting Sat 89-94% on RA with activity Pt with 3/10 Left chest pain with mobility      Mobility  Bed Mobility Overal bed mobility: Needs Assistance Bed Mobility: Supine to Sit Supine to sit: Min assist General bed mobility comments: cueing for sequence to prevent pushing with LUE, assist to fully elevate trunk and scoot to EOB Transfers Overall transfer level: Needs assistance Transfers: Sit to/from Stand;Stand Pivot Transfers Sit to Stand: Min assist General transfer comment: cueing for sequence and to adhere to precautions not pushing with LUE with assist for balance and elevation from surface Ambulation/Gait Ambulation/Gait  assistance: Min guard Ambulation Distance (Feet): 65 Feet Assistive device: Rolling walker (2 wheeled) Gait Pattern/deviations: Step-through pattern;Decreased stride length Gait velocity interpretation: <1.8 ft/sec, indicative of risk for recurrent falls General Gait Details: cueing for position in [place] and posture as pt initially without RW and reaching for environmental supports, much steadier with use of AD    Exercises     PT Diagnosis: Difficulty walking  PT Problem List: Decreased strength;Decreased activity tolerance;Decreased balance;Decreased mobility;Decreased knowledge of precautions;Decreased knowledge of use of DME PT Treatment Interventions: Gait training;Stair training;Functional mobility training;Therapeutic activities;Therapeutic exercise;Patient/family education;DME instruction     PT Goals(Current goals can be found in the care plan section) Acute Rehab PT Goals Patient Stated Goal: to feel PT Goal Formulation: With patient/family Time For Goal Achievement: 06/30/13 Potential to Achieve Goals: Good  Visit Information  Last PT Received On: 06/16/13 Assistance Needed: +1 History of Present Illness: 80 year old female with recent pacemaker placement for complete heart block has left sided chest pain increased with breathing and cough. Echocardiogram revealed large pericardial effusion with tamponade.s/p drainage of effusion and replacement of pacemaker lead       Prior [place] expects to be discharged to:: Private residence Living Arrangements: Children Available Help at Discharge: Family;Available PRN/intermittently Type of Home: House Home Access: Stairs to enter [employer] of Steps: 4 Entrance Stairs-Rails: Right Home Layout: Two level;Able to live on main level with bedroom/bathroom Home Equipment: Gilford Rile - 2 wheels;Cane - single point;Shower seat Prior Function Level of Independence: Needs assistance ADL's /  Homemaking Assistance Needed: needing assist for bathing, dressing and housework since initial pacemaker placement was previously independent Communication Communication: No difficulties    Cognition  Cognition Arousal/Alertness: Awake/alert Behavior During Therapy: WFL for tasks assessed/performed Overall Cognitive Status: Within Functional Limits for tasks assessed    Extremity/Trunk Assessment Upper Extremity Assessment Upper Extremity Assessment: Generalized weakness Lower Extremity Assessment Lower Extremity Assessment: Generalized weakness Cervical / Trunk Assessment Cervical / Trunk Assessment: Normal   Balance    End of Session PT - End of Session Equipment Utilized During Treatment: Gait belt Activity Tolerance: Patient tolerated treatment well Patient left: in bed;with call bell/phone within reach;with family/visitor present Nurse Communication: Mobility status;Precautions  GP     Lanetta Inch Beth 06/16/2013, 12:19 PM Elwyn Reach, [place]

## 2013-06-16 NOTE — Procedures (Signed)
[place] chest  1. Large free flowing effusion left chest No loculation or fibrinous material Small lung flap  Lavon Paganini. Titus Mould, MD, [place] Pgr: [facility]

## 2013-06-17 ENCOUNTER — Inpatient Hospital Stay (HOSPITAL_COMMUNITY): Payer: Medicare Other

## 2013-06-17 DIAGNOSIS — J90 Pleural effusion, not elsewhere classified: Secondary | ICD-10-CM | POA: Diagnosis not present

## 2013-06-17 DIAGNOSIS — J158 Pneumonia due to other specified bacteria: Secondary | ICD-10-CM | POA: Diagnosis not present

## 2013-06-17 DIAGNOSIS — I2510 Atherosclerotic heart disease of native coronary artery without angina pectoris: Secondary | ICD-10-CM | POA: Diagnosis not present

## 2013-06-17 DIAGNOSIS — Z950 Presence of cardiac pacemaker: Secondary | ICD-10-CM | POA: Diagnosis not present

## 2013-06-17 DIAGNOSIS — I314 Cardiac tamponade: Secondary | ICD-10-CM | POA: Diagnosis not present

## 2013-06-17 LAB — COMPREHENSIVE METABOLIC PANEL
ALT: 16 U/L (ref 0–35)
AST: 28 U/L (ref 0–37)
Albumin: 2.1 g/dL — ABNORMAL LOW (ref 3.5–5.2)
Alkaline Phosphatase: 67 U/L (ref 39–117)
BUN: 15 mg/dL (ref 6–23)
CO2: 21 mEq/L (ref 19–32)
Calcium: 8.1 mg/dL — ABNORMAL LOW (ref 8.4–10.5)
Chloride: 94 mEq/L — ABNORMAL LOW (ref 96–112)
Creatinine, Ser: 0.69 mg/dL (ref 0.50–1.10)
GFR calc Af Amer: >90 mL/min (ref >90)
GFR calc non Af Amer: 79 mL/min — ABNORMAL LOW (ref >90)
Glucose, Bld: 105 mg/dL — ABNORMAL HIGH (ref 70–99)
Potassium: 4.6 mEq/L (ref 3.7–5.3)
Sodium: 131 mEq/L — ABNORMAL LOW (ref 137–147)
Total Bilirubin: 0.4 mg/dL (ref 0.3–1.2)
Total Protein: 5.8 g/dL — ABNORMAL LOW (ref 6.0–8.3)

## 2013-06-17 LAB — CBC
HCT: 29 % — ABNORMAL LOW (ref 36.0–46.0)
Hemoglobin: 9.9 g/dL — ABNORMAL LOW (ref 12.0–15.0)
MCH: 31.6 pg (ref 26.0–34.0)
MCHC: 34.1 g/dL (ref 30.0–36.0)
MCV: 92.7 fL (ref 78.0–100.0)
Platelets: 317 10*3/uL (ref 150–400)
RBC: 3.13 MIL/uL — ABNORMAL LOW (ref 3.87–5.11)
RDW: 14 % (ref 11.5–15.5)
WBC: 6.4 10*3/uL (ref 4.0–10.5)

## 2013-06-17 LAB — FUNGAL STAIN
Fungal Smear: NONE SEEN
Special Requests: 6

## 2013-06-17 LAB — OTHER BODY FLUID CHEMISTRY

## 2013-06-17 LAB — PH, BODY FLUID: pH, Fluid: 8

## 2013-06-17 MED ORDER — DEXTROSE 5 % IV SOLN
1.0000 g | Freq: Every day | INTRAVENOUS | Status: DC
  Administered 2013-06-17 – 2013-06-19 (×4): 1 g via INTRAVENOUS
  Filled 2013-06-17 (×7): qty 10

## 2013-06-17 MED ORDER — SODIUM CHLORIDE 0.9 % IV SOLN: 1500.0000 mg | Freq: Once | INTRAVENOUS | Status: DC

## 2013-06-17 MED ORDER — VANCOMYCIN HCL IN DEXTROSE 1-5 GM/200ML-% IV SOLN
1000.0000 mg | INTRAVENOUS | Status: DC
  Administered 2013-06-18 – 2013-06-20 (×3): 1000 mg via INTRAVENOUS
  Filled 2013-06-17 (×3): qty 200

## 2013-06-17 MED ORDER — DILTIAZEM HCL ER COATED BEADS 120 MG PO CP24
120.0000 mg | ORAL_CAPSULE | Freq: Every day | ORAL | Status: DC
  Administered 2013-06-17 – 2013-06-18 (×2): 120 mg via ORAL
  Filled 2013-06-17 (×2): qty 1

## 2013-06-17 MED ORDER — VANCOMYCIN HCL 10 G IV SOLR
1500.0000 mg | Freq: Once | INTRAVENOUS | Status: AC
  Administered 2013-06-17: 1500 mg via INTRAVENOUS
  Filled 2013-06-17: qty 1500

## 2013-06-17 MED ORDER — DIGOXIN 250 MCG PO TABS
0.2500 mg | ORAL_TABLET | Freq: Every day | ORAL | Status: DC
Start: 2013-06-17 — End: 2013-06-18
  Administered 2013-06-17: 0.25 mg via ORAL
  Filled 2013-06-17 (×2): qty 1

## 2013-06-17 NOTE — Progress Notes (Signed)
Subjective:  Feeling better. Some cough. No fever or chest pain. Oxygenation and heart rate improved. On IV antibiotic for gram positive cocci in cluster.  Objective:  Vital Signs in the last 24 hours: Temp:  [97.8 F (36.6 C)-98.7 F (37.1 C)] 98.5 F (36.9 C) (02/10 1134) Pulse Rate:  [58-97] 60 (02/10 1114) Cardiac Rhythm:  [-] Ventricular paced (02/10 0733) Resp:  [13-31] 19 (02/10 0600) BP: (90-150)/(50-99) 140/66 mmHg (02/10 1114) SpO2:  [91 %-100 %] 96 % (02/10 1114) Weight:  [69.3 kg (152 lb 12.5 oz)] 69.3 kg (152 lb 12.5 oz) (02/10 0400)  Physical Exam: BP Readings from Last 1 Encounters:  06/17/13 140/66     Wt Readings from Last 1 Encounters:  06/17/13 69.3 kg (152 lb 12.5 oz)    Weight change: -3 kg (-6 lb 9.8 oz)  HEENT: [place]/AT, Eyes-Hazel, PERL, EOMI, Conjunctiva-Pink, Sclera-Non-icteric Neck: No JVD, No bruit, Trachea midline. Lungs:  Crackles on left side more than right base. Cardiac:  Regular rhythm, normal S1 and S2, no S3. Pericardial rub in supne position. Abdomen:  Soft, non-tender. Extremities:  No edema present. No cyanosis. No clubbing. CNS: AxOx3, Cranial nerves grossly intact, moves all 4 extremities. Right handed. Skin: Warm and dry.   Intake/Output from previous day: 02/09 0701 - 02/10 0700 In: 603 [P.O.:600; I.V.:3] Out: 3500 [Urine:2500]    Lab Results: BMET    Component Value Date/Time   NA 131* 06/17/2013 0240   K 4.6 06/17/2013 0240   CL 94* 06/17/2013 0240   CO2 21 06/17/2013 0240   GLUCOSE 105* 06/17/2013 0240   BUN 15 06/17/2013 0240   CREATININE 0.69 06/17/2013 0240   CALCIUM 8.1* 06/17/2013 0240   GFRNONAA 79* 06/17/2013 0240   GFRAA >90 06/17/2013 0240   CBC    Component Value Date/Time   WBC 6.4 06/17/2013 0240   RBC 3.13* 06/17/2013 0240   HGB 9.9* 06/17/2013 0240   HCT 29.0* 06/17/2013 0240   PLT 317 06/17/2013 0240   MCV 92.7 06/17/2013 0240   MCH 31.6 06/17/2013 0240   MCHC 34.1 06/17/2013 0240   RDW 14.0 06/17/2013 0240   LYMPHSABS 0.8 06/07/2013 1152   MONOABS 0.9 06/07/2013 1152   EOSABS 0.1 06/07/2013 1152   BASOSABS 0.0 06/07/2013 1152   CARDIAC ENZYMES Lab Results  Component Value Date   TROPONINI <0.30 06/07/2013    Scheduled Meds: . cefTRIAXone (ROCEPHIN)  IV  1 g Intravenous QHS  . colchicine  0.6 mg Oral Daily  . metoprolol tartrate  25 mg Oral BID  . sodium chloride  3 mL Intravenous Q12H  . [START ON 06/18/2013] vancomycin  1,000 mg Intravenous Q24H   Continuous Infusions:  PRN Meds:.sodium chloride, acetaminophen, ALPRAZolam, chlorpheniramine-HYDROcodone, ondansetron (ZOFRAN) IV, sodium chloride, traMADol  Assessment/Plan: Pericardial effusion with cardiac tamponade-improved S/P Pericardiocentesis  Hypertension  Mild native vessel coronary artery disease  Anemia of blood loss and iron deficiency  Anxiety  Weakness  S/P dual chamber pacemaker placement with revision  Paroxysmal atrial flutter/fibrillation  Moderate Left pleural effusion Left lung pneumonia  Transfer to telemetry. Continue antibiotics.   LOS: 6 days    Dixie Dials  MD  06/17/2013, 1:22 PM

## 2013-06-17 NOTE — Progress Notes (Signed)
eLink Physician-Brief Progress Note Patient Name: Theresa Norton DOB: [birth year] MRN: [id number]  Date of Service  06/17/2013   HPI/Events of Note  Pleural fluid Gram stain positive for GPC clusters. Possible contaminant (fluid not pus and lymphocyte predominant) but pt has pacemaker in place. Therefore need to trat as real until final cx results back   eICU Interventions  Vanc and ceftriaxone ordered   Intervention Category Major Interventions: Infection - evaluation and management  Merton Border 06/17/2013, 12:40 AM

## 2013-06-17 NOTE — Progress Notes (Signed)
ANTIBIOTIC CONSULT NOTE - INITIAL  Pharmacy Consult for Vancomycin Indication: pleural infection  Allergies  Allergen Reactions  . Codeine     GI upset  . Morphine And Related     GI upset    Patient Measurements: Height: 5\' 3"  (160 cm) Weight: 159 lb 6.3 oz (72.3 kg) IBW/kg (Calculated) : 52.4  Vital Signs: Temp: 98.7 F (37.1 C) (02/10 0000) Temp src: Oral (02/10 0000) BP: 133/99 mmHg (02/10 0000) Pulse Rate: 77 (02/10 0000) Intake/Output from previous day: 02/09 0701 - 02/10 0700 In: 603 [P.O.:600; I.V.:3] Out: 3500 [Urine:2500] Intake/Output from this shift: Total I/O In: 123 [P.O.:120; I.V.:3] Out: 1000 [Urine:1000]  Labs:  Recent Labs  06/14/13 0248  WBC 11.6*  HGB 9.7*  PLT 473*  CREATININE 0.78   Estimated Creatinine Clearance: 51.7 ml/min (by C-G formula based on Cr of 0.78). No results found for this basename: VANCOTROUGH, Corlis Leak, VANCORANDOM, GENTTROUGH, GENTPEAK, GENTRANDOM, TOBRATROUGH, TOBRAPEAK, TOBRARND, AMIKACINPEAK, AMIKACINTROU, AMIKACIN,  in the last 72 hours   Microbiology: Recent Results (from the past 720 hour(s))  MRSA PCR SCREENING     Status: None   Collection Time    05/26/13 11:31 PM      Result Value Range Status   MRSA by PCR NEGATIVE  NEGATIVE Final   Comment:            The GeneXpert MRSA Assay (FDA     approved for NASAL specimens     only), is one component of a     comprehensive MRSA colonization     surveillance program. It is not     intended to diagnose MRSA     infection nor to guide or     monitor treatment for     MRSA infections.  BODY FLUID CULTURE     Status: None   Collection Time    06/11/13  4:40 PM      Result Value Range Status   Specimen Description FLUID PERICARDIAL   Final   Special Requests NONE   Final   Gram Stain     Final   Value: FEW WBC PRESENT,BOTH PMN AND MONONUCLEAR     NO ORGANISMS SEEN     Performed at [facility]   Culture     Final   Value: NO GROWTH 3 DAYS      Performed at [facility]   Report Status 06/14/2013 FINAL   Final  AFB CULTURE WITH SMEAR     Status: None   Collection Time    06/11/13  4:40 PM      Result Value Range Status   Specimen Description PERICARDIAL FLUID   Final   Special Requests ADDED [id number]   Final   ACID FAST SMEAR     Final   Value: NO ACID FAST BACILLI SEEN     Performed at [facility]   Culture     Final   Value: CULTURE WILL BE EXAMINED FOR 6 WEEKS BEFORE ISSUING A FINAL REPORT     Performed at [facility]   Report Status PENDING   Incomplete  BODY FLUID CULTURE     Status: None   Collection Time    06/16/13  4:58 PM      Result Value Range Status   Specimen Description PLEURAL FLUID LEFT   Final   Special Requests 6ML FLUID   Final   Gram Stain     Final   Value: WBC PRESENT,BOTH PMN AND  MONONUCLEAR     GRAM POSITIVE COCCI IN CLUSTERS     Performed at [facility]   Culture PENDING   Incomplete   Report Status PENDING   Incomplete    Medical History: Past Medical History  Diagnosis Date  . Arthritis   . Allergy   . Hypertension   . Colon polyps   . Heart block AV second degree     a. s/p MDT Addapta dual chamber pacemaker 05/2013 by Dr Rayann Heman.  Marland Kitchen PAF (paroxysmal atrial fibrillation)     a. Found post-pacer 05/2013->Xarelto added;  b. 05/2013 Echo: EF 60-65%, mild LVH, nl wall motion w/o rwma.  . Groin hematoma     a. 05/2013 R groin hematoma post-cath - u/s 05/31/13 large 7.49 cm r inguinal hematoma extending into thigh, no psa or avf.  . Anemia   . CAD (coronary artery disease)     a. 05/2013 nonobs dzs by cath.    Medications:  Scheduled:  . cefTRIAXone (ROCEPHIN)  IV  1 g Intravenous QHS  . colchicine  0.6 mg Oral Daily  . diltiazem  30 mg Oral QID  . metoprolol tartrate  25 mg Oral BID  . sodium chloride  3 mL Intravenous Q12H   Assessment: 80 yo female with pleural effusion for empiric antibiotics  Goal of Therapy:  Vancomycin trough  15-20  Plan:  Vancomycin 1500 mg IV now, then 1 g  IV q24h  Aalina Brege, [place] Curb 06/17/2013,12:42 AM

## 2013-06-17 NOTE — Progress Notes (Addendum)
Name: Theresa Norton MRN: [id number] DOB: Jun 08, 1928    ADMISSION DATE:  06/11/2013 CONSULTATION DATE:  06/16/13  REFERRING MD :  Dr. Doylene Canard PRIMARY SERVICE:  Dr. Doylene Canard  CHIEF COMPLAINT:  Pleural Effusion  BRIEF PATIENT DESCRIPTION: 80 y/o female with PMH of HTN, PAF, CAD, and recent pacemaker revision on 1/20 for complete heart block admitted 2/4 with increased cough & SOB.  ECHO revealed a large pericardial effusion with tamponade.  Pericardicentesis was performed with 820 cc of bloody fluid removed per Dr. Doylene Canard.   She underwent revision of R ventricular pacemaker lead.  Under fluoroscopy on 2/7, pericardial effusion noted to be small.  CT Scan on 2/7 revealed increase in bilateral pleural effusion L>R and stable pericardial effusion.  PCCM consulted for pleural effusion evaluation.   SIGNIFICANT EVENTS / STUDIES:  2/4 - admit with cough & SOB, found to have large pericardial effusion with tamponade on echo, s/p pericardiocentesis 2/5 - CT chest: RV lead of pacer penetrating myocardium, hemopericardium with drain in place, small to mod b/l effusions L>R 2/6 - RV lead revision for cardiac perforation 2/6 - Echo 55-60%, small pericardial effusion posterior to heart 2/7 - CT chest: increased b/l effusions, stable pericardial effusion 2/7 - normal device interrogation 2/9 - Pericardial drain removed, PCCM consult for pleural effusion, thora with 1.2L clear yellow fluid removed--Exudative, 62 lymphs, 22 neutrophils 2/10 - CXR: new infiltrate left mid lung  CULTURES: Pericardial Culture 2/4>>>neg Pericardial AFB 2/4>>> Pleural fluid 2/9>>>gram positive cocci in clusters Pleural AFB 2/9>>> Pleural fungal stain 2/9>>>  ANTIBIOTICS: Vancomycin 2/9>>> Ceftriaxone 2/9>>>  SUBJECTIVE: Reports feeling well today, sweating overnight  VITAL SIGNS: Temp:  [97.8 F (36.6 C)-98.7 F (37.1 C)] 97.8 F (36.6 C) (02/10 0720) Pulse Rate:  [58-97] 64 (02/10 0934) Resp:  [12-31] 19 (02/10  0600) BP: (90-157)/(50-99) 97/64 mmHg (02/10 0400) SpO2:  [88 %-100 %] 97 % (02/10 0934) Weight:  [152 lb 12.5 oz (69.3 kg)] 152 lb 12.5 oz (69.3 kg) (02/10 0400)  PHYSICAL EXAMINATION: General: elderly female sitting up in bed, NAD Neuro:  AAOx3 HEENT:  Mm pink/moist Cardiovascular:  RRR Lungs:  CTA B/L Abdomen:  Soft, +bs Musculoskeletal:  Moving all 4 extremities Skin:  Warm/dry   Recent Labs Lab 06/13/13 0230 06/14/13 0248 06/17/13 0240  NA 128* 126* 131*  K 4.5 4.4 4.6  CL 93* 91* 94*  CO2 20 21 21   BUN 21 20 15   CREATININE 0.88 0.78 0.69  GLUCOSE 104* 105* 105*    Recent Labs Lab 06/13/13 0230 06/14/13 0248 06/17/13 0240  HGB 9.8* 9.7* 9.9*  HCT 28.2* 28.3* 29.0*  WBC 12.9* 11.6* 6.4  PLT 546* 473* 317   Dg Chest 2 View  06/17/2013   CLINICAL DATA:  Cough and pleural effusion  EXAM: CHEST  2 VIEW  COMPARISON:  June 16, 2013  FINDINGS: There is new airspace consolidation in the left mid lung. Small pleural effusions with atelectasis in the left base persist. There is also mild atelectatic change in the right upper lobe, stable. No pneumothorax.  Heart is mildly prominent with normal pulmonary vascular. Pacemaker leads are attached to the right atrium and right ventricle. No adenopathy. No bone lesions.  IMPRESSION: New infiltrate left mid lung. Small pleural effusions bilaterally persist as does atelectasis in the right upper and left lower lobe regions. No pneumothorax.   Electronically Signed   By: Lowella Grip M.D.   On: 06/17/2013 07:33   Dg Chest 2 View  06/16/2013  CLINICAL DATA:  Follow-up effusion.  EXAM: CHEST  2 VIEW  COMPARISON:  Chest radiograph June 14, 2013.  FINDINGS: Stable appearance of moderate left and small right pleural effusions. Left lung base consolidation is similar may reflect atelectasis.  The cardiac silhouette is predominantly obscured by the effusions. Mediastinal silhouette is nonsuspicious. Dual lead left cardiac pacemaker in  situ. Small bore left lung base versus pericardial drainage catheter. No pneumothorax.  Multiple EKG lines overlie the patient and may obscure subtle underlying pathology. Soft tissue planes and included osseous structures are nonsuspicious.  IMPRESSION: Stable moderate left and small right pleural effusions with left lung base versus pericardial drainage catheter in place.   Electronically Signed   By: Elon Alas   On: 06/16/2013 04:45   Dg Chest Port 1 View  06/16/2013   CLINICAL DATA:  Status post thoracentesis.  EXAM: PORTABLE CHEST - 1 VIEW  COMPARISON:  PA and lateral chest 06/16/2013.  FINDINGS: Left pleural effusion is decreased after thoracentesis. No pneumothorax is identified. Small right effusion is noted. There is cardiomegaly without edema.  IMPRESSION: Decreased left pleural effusion after thoracentesis. Negative for pneumothorax or other new abnormality.   Electronically Signed   By: Inge Rise M.D.   On: 06/16/2013 17:09   ASSESSMENT / PLAN:  Bilateral Pleural Effusion L>R - s/p thoracentesis 2/9, exudative with 835 wbc, 62 lymphocytes, and 22 neutrophils.  Less likely malignant given acute onset. ?2/2 PNA given new infiltrate on L mid lung with small b/l effusions on cxr today; however, clinically afebrile and appears well with no leukocytosis.   Pleural fluid culture with gram positive cocci in clusters on gram stain.    Plan: -continue vancomycin and rocephin for now, awaiting culture results  -f/u cytology   Rest per cards Can transfer to tele  Signed: Jerene Pitch, MD PGY-2, Internal Medicine Resident Pager: [phone]   [facility] V.  06/17/2013,10:25 AM

## 2013-06-17 NOTE — Progress Notes (Signed)
Patient: Theresa Norton Date of Encounter: 06/17/2013, 7:15 AM Admit date: 06/11/2013     Subjective  Theresa Norton reports persistent cough and mild soreness at implant site. Theresa Norton denies CP or SOB.   Objective  Physical Exam: Vitals: BP 97/64  Pulse 59  Temp(Src) 98.3 F (36.8 C) (Oral)  Resp 19  Ht 5\' 3"  (1.6 m)  Wt 152 lb 12.5 oz (69.3 kg)  BMI 27.07 kg/m2  SpO2 100% General: Well developed, well appearing 80 year old female in no acute distress. Neck: Supple. JVD not elevated. Lungs: Clear bilaterally to auscultation without wheezes, rales, or rhonchi. Breathing is unlabored. Heart: RRR S1 S2 without murmurs, rubs, or gallops.  Abdomen: Soft, non-distended. Extremities: No clubbing or cyanosis. No edema.   Neuro: Alert and oriented X 3. Moves all extremities spontaneously. No focal deficits.  Intake/Output:  Intake/Output Summary (Last 24 hours) at 06/17/13 0715 Last data filed at 06/16/13 2200  Gross per 24 hour  Intake    603 ml  Output   3500 ml  Net  -2897 ml    Inpatient Medications:  . cefTRIAXone (ROCEPHIN)  IV  1 g Intravenous QHS  . colchicine  0.6 mg Oral Daily  . diltiazem  30 mg Oral QID  . metoprolol tartrate  25 mg Oral BID  . sodium chloride  3 mL Intravenous Q12H  . [START ON 06/18/2013] vancomycin  1,000 mg Intravenous Q24H    Labs:  Recent Labs  06/17/13 0240  NA 131*  K 4.6  CL 94*  CO2 21  GLUCOSE 105*  BUN 15  CREATININE 0.69  CALCIUM 8.1*    Recent Labs  06/17/13 0240  WBC 6.4  HGB 9.9*  HCT 29.0*  MCV 92.7  PLT 317    Radiology/Studies: Dg Chest 2 View  06/16/2013   CLINICAL DATA:  Follow-up effusion.  EXAM: CHEST  2 VIEW  COMPARISON:  Chest radiograph June 14, 2013.  FINDINGS: Stable appearance of moderate left and small right pleural effusions. Left lung base consolidation is similar may reflect atelectasis.  The cardiac silhouette is predominantly obscured by the effusions. Mediastinal silhouette is  nonsuspicious. Dual lead left cardiac pacemaker in situ. Small bore left lung base versus pericardial drainage catheter. No pneumothorax.  Multiple EKG lines overlie the patient and may obscure subtle underlying pathology. Soft tissue planes and included osseous structures are nonsuspicious.  IMPRESSION: Stable moderate left and small right pleural effusions with left lung base versus pericardial drainage catheter in place.   Electronically Signed   By: Elon Alas   On: 06/16/2013 04:45   Dg Chest 2 View  06/14/2013   CLINICAL DATA:  Weakness  EXAM: CHEST  2 VIEW  COMPARISON:  06/12/2013  FINDINGS: A pacing device is again seen. Cardiac shadow remains enlarged. A pericardial drain is again seen. And increasing left-sided pleural effusion is noted as well. Some air bronchograms are seen likely related to some left basilar consolidation.  IMPRESSION: Increasing left basilar infiltrate and effusion.   Electronically Signed   By: Inez Catalina M.D.   On: 06/14/2013 07:21   Ct Chest Wo Contrast  06/14/2013   CLINICAL DATA:  CXR with bilateral pleural effusions, Patient is 1 day Post-op RA and RV lead repositioning with pericardial drain- reassess for pericardial/pleural effusions  EXAM: CT CHEST WITHOUT CONTRAST  TECHNIQUE: Multidetector CT imaging of the chest was performed following the standard protocol without IV contrast.  COMPARISON:  DG CHEST 2 VIEW dated 06/14/2013;  CT CHEST W/O CM dated 06/12/2013  FINDINGS: The thoracic inlet is unremarkable.  Multichamber cardiac enlargement is appreciated. The dual-chambered cardiac pacing unit leads are appreciated projecting in the region of the right atrium and right ventricle. Evaluation is otherwise limited secondary to cardiac motion. Pericardial drain is again appreciated and unchanged. The small to moderate pericardial effusion unchanged. There is no evidence of mediastinal adenopathy nor mass is appreciated within the limitations of a non contrasted CT.  There  has been slight increase sized patient's left pleural effusion. Again appearing to be a small to moderate in size. A small right pleural effusion is identified which is also slightly increased in size.  The lung parenchyma demonstrates areas of compressive atelectasis within the bases adjacent to the previously described effusions. Otherwise no focal regions of consolidation appreciated.  The visualized upper abdominal viscera demonstrate a stable low attenuating 1 cm focus within the dome of right lobe of the liver likely reflecting a cyst. Remaining visualized upper abdominal viscera are unremarkable.  Atherosclerotic calcification identified within the thoracic and visualized portion of the abdominal aorta. Small punctate coronary artery calcifications project within the left coronary artery.  IMPRESSION: 1. Slight increased size of the patient's bilateral effusions moderate on the left, small on the right 2. Stable pericardial effusion. 3. Stable findings otherwise within the chest. Atherosclerotic calcifications are appreciated.   Electronically Signed   By: Margaree Mackintosh M.D.   On: 06/14/2013 10:52    Telemetry: A sensed V paced; brief atrial flutter yesterday afternoon   Assessment and Plan   1. RV lead perforations/p RV lead revision - has left pleural effusion with atelectasis, now s/p left sided thoracentesis yesterday  2. Complete heart block s/p recent PPM implant - normal device function by interrogation 06/14/2013  3. Paroxysmal atrial fibrillation - diagnosed post PPM implant Jan 2015 and Xarelto added - Dr. Rayann Heman advised no anticoagulation at this time   Signed, EDMISTEN, BROOKE PA-C  I have seen, examined the patient, and reviewed the above assessment and plan.  Changes to above are made where necessary.  Theresa Norton continues to make slow improvement.  Theresa Norton is s/p thoracentesis yesterday.  CXR reviewed.  I agree with pulmonary that antibiotics are reasonable at this time.  Theresa Norton is now  in afib.  As Theresa Norton has complete heart block, I will stop diltiazem which should not be required for rate control.  Theresa Norton is not a candidate for anticoagulation at this time.  I would recommend that we transfer to telemetry today. Continue physical therapy  Co Sign: Thompson Grayer, MD 06/17/2013 8:24 AM

## 2013-06-18 DIAGNOSIS — Z950 Presence of cardiac pacemaker: Secondary | ICD-10-CM | POA: Diagnosis not present

## 2013-06-18 DIAGNOSIS — I2510 Atherosclerotic heart disease of native coronary artery without angina pectoris: Secondary | ICD-10-CM | POA: Diagnosis not present

## 2013-06-18 DIAGNOSIS — J90 Pleural effusion, not elsewhere classified: Secondary | ICD-10-CM | POA: Diagnosis not present

## 2013-06-18 DIAGNOSIS — I314 Cardiac tamponade: Secondary | ICD-10-CM | POA: Diagnosis not present

## 2013-06-18 DIAGNOSIS — D509 Iron deficiency anemia, unspecified: Secondary | ICD-10-CM | POA: Diagnosis not present

## 2013-06-18 MED ORDER — FUROSEMIDE 10 MG/ML IJ SOLN
40.0000 mg | Freq: Once | INTRAMUSCULAR | Status: AC
  Administered 2013-06-18: 40 mg via INTRAVENOUS
  Filled 2013-06-18: qty 4

## 2013-06-18 MED ORDER — GUAIFENESIN ER 600 MG PO TB12
600.0000 mg | ORAL_TABLET | Freq: Two times a day (BID) | ORAL | Status: DC
  Administered 2013-06-18 – 2013-06-19 (×4): 600 mg via ORAL
  Filled 2013-06-18 (×7): qty 1

## 2013-06-18 MED ORDER — DIGOXIN 125 MCG PO TABS
0.1250 mg | ORAL_TABLET | Freq: Every day | ORAL | Status: DC
  Administered 2013-06-18 – 2013-06-19 (×2): 0.125 mg via ORAL
  Filled 2013-06-18 (×3): qty 1

## 2013-06-18 MED ORDER — DILTIAZEM HCL ER COATED BEADS 180 MG PO CP24
180.0000 mg | ORAL_CAPSULE | Freq: Every day | ORAL | Status: DC
  Administered 2013-06-19: 180 mg via ORAL
  Filled 2013-06-18 (×2): qty 1

## 2013-06-18 NOTE — Progress Notes (Signed)
Name: Theresa Norton MRN: [id number] DOB: 1928/12/06    ADMISSION DATE:  06/11/2013 CONSULTATION DATE:  06/16/13  REFERRING MD :  Dr. Doylene Canard  CHIEF COMPLAINT:  Pleural Effusion  BRIEF PATIENT DESCRIPTION: 80 y/o female with PMH of HTN, PAF, CAD, and recent pacemaker revision on 1/20 for complete heart block admitted 2/4 with increased cough & SOB.  ECHO revealed a large pericardial effusion with tamponade.  Pericardicentesis was performed with 820 cc of bloody fluid removed per Dr. Doylene Canard.   She underwent revision of R ventricular pacemaker lead.  Under fluoroscopy on 2/7, pericardial effusion noted to be small.  CT Scan on 2/7 revealed increase in bilateral pleural effusion L>R and stable pericardial effusion.  PCCM consulted for pleural effusion evaluation.   SIGNIFICANT EVENTS / STUDIES:  2/4 - admit with cough & SOB, found to have large pericardial effusion with tamponade on echo, s/p pericardiocentesis 2/5 - CT chest: RV lead of pacer penetrating myocardium, hemopericardium with drain in place, small to mod b/l effusions L>R 2/6 - RV lead revision for cardiac perforation 2/6 - Echo 55-60%, small pericardial effusion posterior to heart 2/7 - CT chest: increased b/l effusions, stable pericardial effusion 2/7 - normal device interrogation 2/9 - Pericardial drain removed, PCCM consult for pleural effusion, thora with 1.2L clear yellow fluid removed--Exudative, 62 lymphs, 22 neutrophils 2/10 - CXR: new infiltrate left mid lung  CULTURES: Pericardial Culture 2/4>>>neg Pericardial AFB 2/4>>> Pleural fluid 2/9>>>gram positive cocci in clusters Pleural AFB 2/9>>> Pleural fungal stain 2/9>>>  ANTIBIOTICS: Vancomycin 2/9>>> Ceftriaxone 2/9>>>  SUBJECTIVE:  No chest pain.  Breathing okay.  C/o chest congestion, but difficulty to expectorate.  Feels fatigued with activity.  VITAL SIGNS: Temp:  [97.8 F (36.6 C)-98.5 F (36.9 C)] 98 F (36.7 C) (02/11 0500) Pulse Rate:  [58-89] 89  (02/11 1023) Resp:  [18-20] 18 (02/11 1023) BP: (145-155)/(61-89) 152/89 mmHg (02/11 1023) SpO2:  [80 %-97 %] 95 % (02/11 1100) Weight:  [155 lb 4.8 oz (70.444 kg)] 155 lb 4.8 oz (70.444 kg) (02/11 0500)  PHYSICAL EXAMINATION: General: elderly female sitting up in bed, NAD Neuro:  AAOx3 HEENT:  Mm pink/moist Cardiovascular:  RRR Lungs: faint rales LT base Abdomen:  Soft, +bs Musculoskeletal:  Moving all 4 extremities Skin:  Warm/dry  CBC Recent Labs     06/17/13  0240  WBC  6.4  HGB  9.9*  HCT  29.0*  PLT  317   BMET Recent Labs     06/17/13  0240  NA  131*  K  4.6  CL  94*  CO2  21  BUN  15  CREATININE  0.69  GLUCOSE  105*    Electrolytes Recent Labs     06/17/13  0240  CALCIUM  8.1*   Liver Enzymes Recent Labs     06/17/13  0240  AST  28  ALT  16  ALKPHOS  67  BILITOT  0.4  ALBUMIN  2.1*   Imaging Dg Chest 2 View  06/17/2013   CLINICAL DATA:  Cough and pleural effusion  EXAM: CHEST  2 VIEW  COMPARISON:  June 16, 2013  FINDINGS: There is new airspace consolidation in the left mid lung. Small pleural effusions with atelectasis in the left base persist. There is also mild atelectatic change in the right upper lobe, stable. No pneumothorax.  Heart is mildly prominent with normal pulmonary vascular. Pacemaker leads are attached to the right atrium and right ventricle. No adenopathy. No bone lesions.  IMPRESSION: New infiltrate left mid lung. Small pleural effusions bilaterally persist as does atelectasis in the right upper and left lower lobe regions. No pneumothorax.   Electronically Signed   By: Lowella Grip M.D.   On: 06/17/2013 07:33   Dg Chest Port 1 View  06/16/2013   CLINICAL DATA:  Status post thoracentesis.  EXAM: PORTABLE CHEST - 1 VIEW  COMPARISON:  PA and lateral chest 06/16/2013.  FINDINGS: Left pleural effusion is decreased after thoracentesis. No pneumothorax is identified. Small right effusion is noted. There is cardiomegaly without  edema.  IMPRESSION: Decreased left pleural effusion after thoracentesis. Negative for pneumothorax or other new abnormality.   Electronically Signed   By: Inge Rise M.D.   On: 06/16/2013 17:09       ASSESSMENT / PLAN: A: Left exudate pleural effusion with GPC on gram stain. Lt lung infiltrate on CXR 2/10. P: -continue vancomycin and rocephin for now, awaiting culture results  -f/u cytology  -f/u PA/Lateral CXR 2/12  Updated family at bedside.  Chesley Mires, MD [facility] Pulmonary/Critical Care 06/18/2013, 12:05 PM Pager:  [phone] After 3pm call: [phone]

## 2013-06-18 NOTE — Progress Notes (Signed)
Physical Therapy Treatment Patient Details Name: Theresa Norton MRN: [id number] DOB: [birth year] Today's Date: 06/18/2013 Time: 4580-9983 PT Time Calculation (min): 23 min  PT Assessment / Plan / Recommendation  History of Present Illness 80 year old female with recent pacemaker placement for complete heart block has left sided chest pain increased with breathing and cough. Echocardiogram revealed large pericardial effusion with tamponade.s/p drainage of effusion and replacement of pacemaker lead   PT Comments   Pt progressing well; she ambulated with RW  (Or use of the rail ) and min guard, but needed min assist when unable to touch a rail or countertop.  Pt also amb. without oxygen and appear to do well.  Unable to get an objective sat reading from pt's cold fingers.  Have asked the RN to try her off O2 this pm to get some objective numbers.  Hopefully pt will not need to d/c home on oxygen.   Follow Up Recommendations  Home health PT;Supervision for mobility/OOB     Does the patient have the potential to tolerate intense rehabilitation     Barriers to Discharge        Equipment Recommendations  None recommended by PT    Recommendations for Other Services    Frequency Min 3X/week   Progress towards PT Goals Progress towards PT goals: Progressing toward goals  Plan Current plan remains appropriate    Precautions / Restrictions Precautions Precautions: Fall;ICD/Pacemaker Restrictions Weight Bearing Restrictions: No   Pertinent Vitals/Pain     Mobility  Bed Mobility Overal bed mobility: Needs Assistance Bed Mobility: Supine to Sit Supine to sit: Min guard General bed mobility comments: cuing to prevent pt from using L UE Transfers Overall transfer level: Needs assistance Transfers: Sit to/from Stand Sit to Stand: Min guard General transfer comment: cueing for sequence and to adhere to precautions not pushing with LUE with assist for balance and elevation from  surface Ambulation/Gait Ambulation/Gait assistance: Min guard;Min assist Ambulation Distance (Feet): 250 Feet (total min guard with RW or rail and min no device) Assistive device: Rolling walker (2 wheeled);None (or rail) Gait Pattern/deviations: Step-through pattern Gait velocity interpretation: Below normal speed for age/gender General Gait Details: mildly unsteady when not able to hold to an assistive device Stairs: Yes Stairs assistance: Min guard Stair Management: One rail Right;Step to pattern;Forwards Number of Stairs: 4    Exercises     PT Diagnosis:    PT Problem List:   PT Treatment Interventions:     PT Goals (current goals can now be found in the care plan section) Acute Rehab PT Goals PT Goal Formulation: With patient/family Time For Goal Achievement: 06/30/13 Potential to Achieve Goals: Good  Visit Information  Last PT Received On: 06/18/13 Assistance Needed: +1 History of Present Illness: 80 year old female with recent pacemaker placement for complete heart block has left sided chest pain increased with breathing and cough. Echocardiogram revealed large pericardial effusion with tamponade.s/p drainage of effusion and replacement of pacemaker lead    Subjective Data  Subjective: I'm a little nervous about walking without holding to something   Cognition  Cognition Arousal/Alertness: Awake/alert Behavior During Therapy: WFL for tasks assessed/performed Overall Cognitive Status: Within Functional Limits for tasks assessed    Balance  Balance Overall balance assessment: Needs assistance Sitting-balance support: Feet supported;No upper extremity supported Sitting balance-Leahy Scale: Good Standing balance support: No upper extremity supported;During functional activity Standing balance-Leahy Scale: Fair  End of Session PT - End of Session Activity Tolerance: Patient tolerated  treatment well Patient left: in bed;with call bell/phone within reach;with  family/visitor present Nurse Communication: Mobility status;Precautions   GP     Dhriti Fales, Tessie Fass 06/18/2013, 4:05 PM 06/18/2013  Donnella Sham, [place] [phone]  (pager)

## 2013-06-18 NOTE — Progress Notes (Signed)
Subjective:  Feeling better. Some cough. Low oxygen saturation of 80 % on room air.  Objective:  Vital Signs in the last 24 hours: Temp:  [97.8 F (36.6 C)-98.5 F (36.9 C)] 98 F (36.7 C) (02/11 0500) Pulse Rate:  [58-81] 81 (02/11 0500) Cardiac Rhythm:  [-] Ventricular paced (02/11 0700) Resp:  [18-20] 20 (02/11 0500) BP: (128-155)/(61-88) 155/81 mmHg (02/11 0500) SpO2:  [95 %-97 %] 97 % (02/11 0500) Weight:  [70.444 kg (155 lb 4.8 oz)] 70.444 kg (155 lb 4.8 oz) (02/11 0500)  Physical Exam: BP Readings from Last 1 Encounters:  06/18/13 155/81     Wt Readings from Last 1 Encounters:  06/18/13 70.444 kg (155 lb 4.8 oz)    Weight change: 1.144 kg (2 lb 8.3 oz)  HEENT: [place]/AT, Eyes-Hazel, PERL, EOMI, Conjunctiva-Pale pink, Sclera-Non-icteric Neck: No JVD, No bruit, Trachea midline. Lungs:  Crackles on left side. Cardiac:  Regular rhythm, normal S1 and S2, no S3. II/VI systolic murmur. Abdomen:  Soft, non-tender. Extremities:  No edema present. No cyanosis. No clubbing. CNS: AxOx3, Cranial nerves grossly intact, moves all 4 extremities. Right handed. Skin: Warm and dry.   Intake/Output from previous day: 02/10 0701 - 02/11 0700 In: 240 [P.O.:240] Out: 400 [Urine:400]    Lab Results: BMET    Component Value Date/Time   NA 131* 06/17/2013 0240   K 4.6 06/17/2013 0240   CL 94* 06/17/2013 0240   CO2 21 06/17/2013 0240   GLUCOSE 105* 06/17/2013 0240   BUN 15 06/17/2013 0240   CREATININE 0.69 06/17/2013 0240   CALCIUM 8.1* 06/17/2013 0240   GFRNONAA 79* 06/17/2013 0240   GFRAA >90 06/17/2013 0240   CBC    Component Value Date/Time   WBC 6.4 06/17/2013 0240   RBC 3.13* 06/17/2013 0240   HGB 9.9* 06/17/2013 0240   HCT 29.0* 06/17/2013 0240   PLT 317 06/17/2013 0240   MCV 92.7 06/17/2013 0240   MCH 31.6 06/17/2013 0240   MCHC 34.1 06/17/2013 0240   RDW 14.0 06/17/2013 0240   LYMPHSABS 0.8 06/07/2013 1152   MONOABS 0.9 06/07/2013 1152   EOSABS 0.1 06/07/2013 1152   BASOSABS 0.0  06/07/2013 1152   CARDIAC ENZYMES Lab Results  Component Value Date   TROPONINI <0.30 06/07/2013    Scheduled Meds: . cefTRIAXone (ROCEPHIN)  IV  1 g Intravenous QHS  . colchicine  0.6 mg Oral Daily  . diltiazem  120 mg Oral Daily  . metoprolol tartrate  25 mg Oral BID  . sodium chloride  3 mL Intravenous Q12H  . vancomycin  1,000 mg Intravenous Q24H   Continuous Infusions:  PRN Meds:.sodium chloride, acetaminophen, ALPRAZolam, chlorpheniramine-HYDROcodone, ondansetron (ZOFRAN) IV, sodium chloride, traMADol  Assessment/Plan: Pericardial effusion with cardiac tamponade-improved  S/P Pericardiocentesis  Hypertension  Mild native vessel coronary artery disease  Anemia of blood loss and iron deficiency  Anxiety  Weakness  S/P dual chamber pacemaker placement with revision  Paroxysmal atrial flutter/fibrillation - Now SR and V-paced  Moderate Left pleural effusion-drained Left lung pneumonia  Continue medical treatment. Labs and chest x-ray in AM.   LOS: 7 days    Dixie Dials  MD  06/18/2013, 10:14 AM

## 2013-06-18 NOTE — Progress Notes (Signed)
    Patient: Theresa Norton Date of Encounter: 06/18/2013, 8:08 AM Admit date: 06/11/2013     Subjective  Theresa Norton reports persistent cough and mild soreness at implant site. She denies CP or SOB.   Objective  Physical Exam: Vitals: BP 155/81  Pulse 81  Temp(Src) 98 F (36.7 C) (Oral)  Resp 20  Ht 5\' 3"  (1.6 m)  Wt 155 lb 4.8 oz (70.444 kg)  BMI 27.52 kg/m2  SpO2 97% General: Well developed, well appearing 80 year old female in no acute distress. Neck: Supple. JVD not elevated. Lungs: Crackles at left base Heart: RRR S1 S2 without murmurs, rubs, or gallops.  Abdomen: Soft, non-distended. Extremities: No clubbing or cyanosis. No edema.   Neuro: Alert and oriented X 3. Moves all extremities spontaneously. No focal deficits.  Intake/Output:  Intake/Output Summary (Last 24 hours) at 06/18/13 5176 Last data filed at 06/17/13 1700  Gross per 24 hour  Intake    240 ml  Output    400 ml  Net   -160 ml    Inpatient Medications:  . cefTRIAXone (ROCEPHIN)  IV  1 g Intravenous QHS  . colchicine  0.6 mg Oral Daily  . digoxin  0.25 mg Oral Daily  . diltiazem  120 mg Oral Daily  . metoprolol tartrate  25 mg Oral BID  . sodium chloride  3 mL Intravenous Q12H  . vancomycin  1,000 mg Intravenous Q24H    Labs:  Recent Labs  06/17/13 0240  NA 131*  K 4.6  CL 94*  CO2 21  GLUCOSE 105*  BUN 15  CREATININE 0.69  CALCIUM 8.1*    Recent Labs  06/17/13 0240  WBC 6.4  HGB 9.9*  HCT 29.0*  MCV 92.7  PLT 317   Telemetry: A sensed V paced    Assessment and Plan   1. RV lead revision - stable  2. Complete heart block s/p recent PPM implant - normal device function by interrogation 06/14/2013  3. Paroxysmal atrial fibrillation - diagnosed post PPM implant Jan 2015 and Xarelto added which is now on hold - Dr. Rayann Heman advised no anticoagulation at this time  - continue metoprolol for rate control and recommend discontinuing digoxin in setting of normal  LVEF  Signed, EDMISTEN, BROOKE PA-C I have seen, examined the patient, and reviewed the above assessment and plan.  Changes to above are made where necessary.  Making slow but steady progress. Will add incentive spirometry Pulmonary to assist with antibiotics PT following I will follow from a distance.  Please call if I can assist further.  Co Sign: Thompson Grayer, MD 06/18/2013 8:33 AM

## 2013-06-19 ENCOUNTER — Inpatient Hospital Stay (HOSPITAL_COMMUNITY): Payer: Medicare Other

## 2013-06-19 DIAGNOSIS — J90 Pleural effusion, not elsewhere classified: Secondary | ICD-10-CM | POA: Diagnosis not present

## 2013-06-19 DIAGNOSIS — I2510 Atherosclerotic heart disease of native coronary artery without angina pectoris: Secondary | ICD-10-CM | POA: Diagnosis not present

## 2013-06-19 DIAGNOSIS — Z950 Presence of cardiac pacemaker: Secondary | ICD-10-CM | POA: Diagnosis not present

## 2013-06-19 DIAGNOSIS — E871 Hypo-osmolality and hyponatremia: Secondary | ICD-10-CM | POA: Diagnosis not present

## 2013-06-19 DIAGNOSIS — J9819 Other pulmonary collapse: Secondary | ICD-10-CM | POA: Diagnosis not present

## 2013-06-19 DIAGNOSIS — I314 Cardiac tamponade: Secondary | ICD-10-CM | POA: Diagnosis not present

## 2013-06-19 LAB — CBC
HCT: 29 % — ABNORMAL LOW (ref 36.0–46.0)
Hemoglobin: 10 g/dL — ABNORMAL LOW (ref 12.0–15.0)
MCH: 31.3 pg (ref 26.0–34.0)
MCHC: 34.5 g/dL (ref 30.0–36.0)
MCV: 90.9 fL (ref 78.0–100.0)
Platelets: 268 10*3/uL (ref 150–400)
RBC: 3.19 MIL/uL — ABNORMAL LOW (ref 3.87–5.11)
RDW: 13.7 % (ref 11.5–15.5)
WBC: 6.5 10*3/uL (ref 4.0–10.5)

## 2013-06-19 LAB — BASIC METABOLIC PANEL
BUN: 10 mg/dL (ref 6–23)
BUN: 8 mg/dL (ref 6–23)
CO2: 26 mEq/L (ref 19–32)
CO2: 25 mEq/L (ref 19–32)
Calcium: 8 mg/dL — ABNORMAL LOW (ref 8.4–10.5)
Calcium: 8.1 mg/dL — ABNORMAL LOW (ref 8.4–10.5)
Chloride: 86 mEq/L — ABNORMAL LOW (ref 96–112)
Chloride: 90 mEq/L — ABNORMAL LOW (ref 96–112)
Creatinine, Ser: 0.77 mg/dL (ref 0.50–1.10)
Creatinine, Ser: 0.69 mg/dL (ref 0.50–1.10)
GFR calc Af Amer: 88 mL/min — ABNORMAL LOW (ref >90)
GFR calc Af Amer: >90 mL/min (ref >90)
GFR calc non Af Amer: 76 mL/min — ABNORMAL LOW (ref >90)
GFR calc non Af Amer: 79 mL/min — ABNORMAL LOW (ref >90)
Glucose, Bld: 102 mg/dL — ABNORMAL HIGH (ref 70–99)
Glucose, Bld: 111 mg/dL — ABNORMAL HIGH (ref 70–99)
Potassium: 3.9 mEq/L (ref 3.7–5.3)
Potassium: 4.4 mEq/L (ref 3.7–5.3)
Sodium: 125 mEq/L — ABNORMAL LOW (ref 137–147)
Sodium: 129 mEq/L — ABNORMAL LOW (ref 137–147)

## 2013-06-19 LAB — OTHER BODY FLUID CHEMISTRY

## 2013-06-19 MED ORDER — COLCHICINE 0.6 MG PO TABS: 0.6000 mg | ORAL_TABLET | Freq: Every day | ORAL | Status: DC

## 2013-06-19 MED ORDER — SODIUM CHLORIDE 0.9 % IV SOLN
INTRAVENOUS | Status: DC
  Administered 2013-06-19: 50 mL/h via INTRAVENOUS

## 2013-06-19 MED ORDER — POTASSIUM CHLORIDE ER 10 MEQ PO TBCR: 10.0000 meq | EXTENDED_RELEASE_TABLET | Freq: Every day | ORAL | Status: DC

## 2013-06-19 MED ORDER — LEVOFLOXACIN 500 MG PO TABS: 500.0000 mg | ORAL_TABLET | Freq: Every day | ORAL | Status: DC

## 2013-06-19 MED ORDER — FUROSEMIDE 20 MG PO TABS: 20.0000 mg | ORAL_TABLET | ORAL | Status: DC

## 2013-06-19 MED ORDER — POTASSIUM CHLORIDE ER 10 MEQ PO TBCR
10.0000 meq | EXTENDED_RELEASE_TABLET | Freq: Two times a day (BID) | ORAL | Status: DC
  Administered 2013-06-19 (×2): 10 meq via ORAL
  Filled 2013-06-19 (×4): qty 1

## 2013-06-19 MED ORDER — METOPROLOL TARTRATE 50 MG PO TABS: 25.0000 mg | ORAL_TABLET | Freq: Two times a day (BID) | ORAL | Status: DC

## 2013-06-19 MED ORDER — DILTIAZEM HCL ER COATED BEADS 180 MG PO CP24: 180.0000 mg | ORAL_CAPSULE | Freq: Every day | ORAL | Status: DC

## 2013-06-19 MED ORDER — DIGOXIN 125 MCG PO TABS: 0.1250 mg | ORAL_TABLET | Freq: Every day | ORAL | Status: DC

## 2013-06-19 MED ORDER — FUROSEMIDE 20 MG PO TABS
20.0000 mg | ORAL_TABLET | ORAL | Status: DC
  Filled 2013-06-19: qty 1

## 2013-06-19 NOTE — Progress Notes (Signed)
Patient evaluated for community based chronic disease management services with [facility] as a benefit of patient's [employer]. Spoke with patient at bedside to explain [facility] services.  Services have been declined as she feels that she and her daughter can manage with home healtn.  Left contact information and [facility] literature at bedside. Made Inpatient Case Manager aware that [facility] following. Of note, [facility] Management services does not replace or interfere with any services that are arranged by inpatient case management or social work.  For additional questions or referrals please contact Corliss Blacker BSN RN [facility] Liaison at [phone].

## 2013-06-19 NOTE — Progress Notes (Signed)
SATURATION QUALIFICATIONS: (This note is used to comply with regulatory documentation for home oxygen)  Patient Saturations on Room Air at Rest = 91%  Patient Saturations on Room Air while Ambulating = 85%  Patient Saturations on 2 Liters of oxygen while Ambulating = 95%  Please briefly explain why patient needs home oxygen: Pt saturations dropped periodically with activity.

## 2013-06-19 NOTE — Progress Notes (Signed)
Name: Theresa Norton MRN: [id number] DOB: Apr 27, 1929    ADMISSION DATE:  06/11/2013 CONSULTATION DATE:  06/16/13  REFERRING MD :  Dr. Doylene Canard  CHIEF COMPLAINT:  Pleural Effusion  BRIEF PATIENT DESCRIPTION: 80 y/o female with PMH of HTN, PAF, CAD, and recent pacemaker revision on 1/20 for complete heart block admitted 2/4 with increased cough & SOB.  ECHO revealed a large pericardial effusion with tamponade.  Pericardicentesis was performed with 820 cc of bloody fluid removed per Dr. Doylene Canard.   She underwent revision of R ventricular pacemaker lead.  Under fluoroscopy on 2/7, pericardial effusion noted to be small.  CT Scan on 2/7 revealed increase in bilateral pleural effusion L>R and stable pericardial effusion.  PCCM consulted for pleural effusion evaluation.   SIGNIFICANT EVENTS / STUDIES:  2/4 - admit with cough & SOB, found to have large pericardial effusion with tamponade on echo, s/p pericardiocentesis 2/5 - CT chest: RV lead of pacer penetrating myocardium, hemopericardium with drain in place, small to mod b/l effusions L>R 2/6 - RV lead revision for cardiac perforation 2/6 - Echo 55-60%, small pericardial effusion posterior to heart 2/7 - CT chest: increased b/l effusions, stable pericardial effusion 2/7 - normal device interrogation 2/9 - Pericardial drain removed, PCCM consult for pleural effusion, thora with 1.2L clear yellow fluid removed--Exudative, 62 lymphs, 22 neutrophils, cytology negative 2/10 - CXR: new infiltrate left mid lung  CULTURES: Pericardial Culture 2/4>>>neg Pericardial AFB 2/4>>> Pleural fluid 2/9>>>gram positive cocci in clusters Pleural AFB 2/9>>> Pleural fungal stain 2/9>>>  ANTIBIOTICS: Vancomycin 2/9>>> Ceftriaxone 2/9>>>  SUBJECTIVE:  Anxious to go home.  Denies chest pain.  VITAL SIGNS: Temp:  [98.1 F (36.7 C)-98.6 F (37 C)] 98.4 F (36.9 C) (02/12 0409) Pulse Rate:  [81-92] 86 (02/12 0409) Resp:  [18-20] 20 (02/12 0409) BP:  (151-153)/(76-92) 151/76 mmHg (02/12 0409) SpO2:  [91 %-99 %] 91 % (02/12 0409) Weight:  [154 lb 8 oz (70.081 kg)] 154 lb 8 oz (70.081 kg) (02/12 0409)  PHYSICAL EXAMINATION: General: elderly female sitting up in bed, NAD Neuro:  AAOx3 HEENT:  Mm pink/moist Cardiovascular:  RRR Lungs: faint rales LT base Abdomen:  Soft, +bs Musculoskeletal:  Moving all 4 extremities Skin:  Warm/dry  CBC Recent Labs     06/17/13  0240  06/19/13  0532  WBC  6.4  6.5  HGB  9.9*  10.0*  HCT  29.0*  29.0*  PLT  317  268   BMET Recent Labs     06/17/13  0240  06/19/13  0532  NA  131*  125*  K  4.6  3.9  CL  94*  86*  CO2  21  26  BUN  15  10  CREATININE  0.69  0.77  GLUCOSE  105*  102*    Electrolytes Recent Labs     06/17/13  0240  06/19/13  0532  CALCIUM  8.1*  8.0*   Liver Enzymes Recent Labs     06/17/13  0240  AST  28  ALT  16  ALKPHOS  67  BILITOT  0.4  ALBUMIN  2.1*   Imaging Dg Chest 2 View  06/19/2013   CLINICAL DATA:  Follow-up pleural effusion  EXAM: CHEST  2 VIEW  COMPARISON:  Prior chest x-ray 06/17/2013  FINDINGS: Stable position of left subclavian approach cardiac rhythm maintenance device. Leads project over the right atrium and right ventricle. Unchanged cardiac and mediastinal contours. Improving patchy left basilar opacity compared to prior. Persistent  small bilateral pleural effusions with associated left worse than right bibasilar opacities. Mildly ectatic and atherosclerotic thoracic aorta. Chronic right upper lung pleural parenchymal scarring. No pneumothorax or new abnormality.  IMPRESSION: 1. Improving patchy atelectasis versus infiltrates in the left mid lung. 2. Stable appearance of small left slightly larger than right layering pleural effusions and associated bibasilar atelectasis.   Electronically Signed   By: Jacqulynn Cadet M.D.   On: 06/19/2013 08:14       ASSESSMENT / PLAN: A: Left exudate pleural effusion with GPC on gram stain. Lt lung  infiltrate on CXR 2/10 >> stable on CXR 2/12. P: -continue vancomycin and rocephin for now, awaiting culture results >> once cx results available can narrow abx accordingly and switch to oral Abx -will need CXR f/u as outpt to ensure clearing of infiltrate and effusions  PCCM will sign off.  Please call if additional help is needed.  Chesley Mires, MD [facility] Pulmonary/Critical Care 06/19/2013, 1:42 PM Pager:  [phone] After 3pm call: [phone]

## 2013-06-19 NOTE — Progress Notes (Addendum)
Physical Therapy Treatment and Discharge Patient Details Name: Theresa Norton MRN: [id number] DOB: 04-23-1929 Today's Date: 06/19/2013 Time: 9233-0076 PT Time Calculation (min): 31 min  PT Assessment / Plan / Recommendation  History of Present Illness 80 year old female with recent pacemaker placement for complete heart block has left sided chest pain increased with breathing and cough. Echocardiogram revealed large pericardial effusion with tamponade.s/p drainage of effusion and replacement of pacemaker lead, 2/9 Lt thoracentesis to remove 1.2L fluid   PT Comments   Pt mobilizing well--slightly cautious with good safety awareness. Pt and son report no further concerns re: her mobility and no needs identified. Discussed possible HHPT and pt agrees this is not necessary. PT goals met and signing off.   Follow Up Recommendations  Supervision for mobility/OOB;No PT follow up                 Equipment Recommendations  None recommended by PT    Recommendations for Other Services    Frequency     Progress towards PT Goals Progress towards PT goals: Goals met/education completed, patient discharged from PT  Plan Discharge plan needs to be updated    Precautions / Restrictions Precautions Precautions: ICD/Pacemaker Restrictions Weight Bearing Restrictions: No   Pertinent Vitals/Pain SaO2 92% RA at rest            90% RA with walking HR 90-96 Denied pain    Mobility  Bed Mobility Overal bed mobility: Modified Independent Bed Mobility: Supine to Sit Supine to sit: Modified independent (Device/Increase time) Transfers Overall transfer level: Independent Equipment used: None Transfers: Sit to/from Stand Sit to Stand: Independent General transfer comment: no cues needed from bed or toilet Ambulation/Gait Ambulation/Gait assistance: Min guard;Supervision Ambulation Distance (Feet): 250 Feet Assistive device: None (or rail) Gait Pattern/deviations: WFL(Within Functional  Limits) Gait velocity: decr, however >1.8 ft/sec General Gait Details: reports she feels more secure holding onto rail, however denies feeling off-balance; no unsteadiness, drifting, staggering observed    Exercises  Pt instructed in proper use of incentive spirometer. She was initially unable to state how often she should perform, however by end of session she was able to state 10x/hour.      PT Goals (current goals can now be found in the care plan section)    Visit Information  Last PT Received On: 06/19/13 Assistance Needed: +1 History of Present Illness: 80 year old female with recent pacemaker placement for complete heart block has left sided chest pain increased with breathing and cough. Echocardiogram revealed large pericardial effusion with tamponade.s/p drainage of effusion and replacement of pacemaker lead, 2/9 Lt thoracentesis to remove 1.2L fluid    Subjective Data      Cognition  Cognition Arousal/Alertness: Awake/alert Behavior During Therapy: WFL for tasks assessed/performed Overall Cognitive Status: Within Functional Limits for tasks assessed    Balance  Balance Sitting balance-Leahy Scale: Good Standing balance-Leahy Scale: Good Tandem Stance - Left Leg: 30 Rhomberg - Eyes Opened: 30 Rhomberg - Eyes Closed: 30 Standardized Balance Assessment Standardized Balance Assessment : Berg Balance Test Berg Balance Test Sit to Stand: Able to stand without using hands and stabilize independently Standing Unsupported: Able to stand safely 2 minutes Sitting with Back Unsupported but Feet Supported on Floor or Stool: Able to sit safely and securely 2 minutes Stand to Sit: Sits safely with minimal use of hands Transfers: Able to transfer safely, minor use of hands Standing Unsupported with Eyes Closed: Able to stand 10 seconds safely Standing Ubsupported with Feet  Together: Able to place feet together independently and stand 1 minute safely From Standing, Reach Forward  with Outstretched Arm: Can reach forward >12 cm safely (5") From Standing Position, Pick up Object from Floor: Able to pick up shoe safely and easily From Standing Position, Turn to Look Behind Over each Shoulder: Looks behind one side only/other side shows less weight shift Turn 360 Degrees: Able to turn 360 degrees safely in 4 seconds or less Standing Unsupported, One Foot in Front: Able to plae foot ahead of the other independently and hold 30 seconds  End of Session PT - End of Session Equipment Utilized During Treatment: Gait belt Activity Tolerance: Patient tolerated treatment well Patient left: with call bell/phone within reach;with family/visitor present;in chair Nurse Communication: Mobility status;Other (comment) (D/C from PT; goal to walk 3x/day)   GP     Deamonte Sayegh 06/19/2013, 10:14 AM Pager [phone]

## 2013-06-19 NOTE — Progress Notes (Signed)
Subjective:  Decreasing cough, improving oxygenation. Chest X-ray showing improving patchy atelectasis or infiltrate in left mid lung and small bilateral pleural effusions. Low sodium without dizziness or confusion or leg edema. Afebrile and normal WBC count.  Objective:  Vital Signs in the last 24 hours: Temp:  [98.1 F (36.7 C)-98.6 F (37 C)] 98.4 F (36.9 C) (02/12 0409) Pulse Rate:  [81-92] 86 (02/12 0409) Cardiac Rhythm:  [-] Ventricular paced (02/11 2000) Resp:  [18-20] 20 (02/12 0409) BP: (151-153)/(76-92) 151/76 mmHg (02/12 0409) SpO2:  [80 %-99 %] 91 % (02/12 0409) Weight:  [70.081 kg (154 lb 8 oz)] 70.081 kg (154 lb 8 oz) (02/12 0409)  Physical Exam: BP Readings from Last 1 Encounters:  06/19/13 151/76     Wt Readings from Last 1 Encounters:  06/19/13 70.081 kg (154 lb 8 oz)    Weight change: -0.363 kg (-12.8 oz)  HEENT: [place]/AT, Eyes-Hazel, PERL, EOMI, Conjunctiva-Pale pink, Sclera-Non-icteric Neck: No JVD, No bruit, Trachea midline. Lungs:  Crackles on left side as before. Cardiac:  Regular rhythm, normal S1 and S2, no S3. II/VI systolic murmur. Abdomen:  Soft, non-tender. Extremities:  No edema present. No cyanosis. No clubbing. CNS: AxOx3, Cranial nerves grossly intact, moves all 4 extremities. Right handed. Skin: Warm and dry.   Intake/Output from previous day: 02/11 0701 - 02/12 0700 In: 240 [P.O.:240] Out: 200 [Urine:200]    Lab Results: BMET    Component Value Date/Time   NA 125* 06/19/2013 0532   K 3.9 06/19/2013 0532   CL 86* 06/19/2013 0532   CO2 26 06/19/2013 0532   GLUCOSE 102* 06/19/2013 0532   BUN 10 06/19/2013 0532   CREATININE 0.77 06/19/2013 0532   CALCIUM 8.0* 06/19/2013 0532   GFRNONAA 76* 06/19/2013 0532   GFRAA 88* 06/19/2013 0532   CBC    Component Value Date/Time   WBC 6.5 06/19/2013 0532   RBC 3.19* 06/19/2013 0532   HGB 10.0* 06/19/2013 0532   HCT 29.0* 06/19/2013 0532   PLT 268 06/19/2013 0532   MCV 90.9 06/19/2013 0532   MCH 31.3  06/19/2013 0532   MCHC 34.5 06/19/2013 0532   RDW 13.7 06/19/2013 0532   LYMPHSABS 0.8 06/07/2013 1152   MONOABS 0.9 06/07/2013 1152   EOSABS 0.1 06/07/2013 1152   BASOSABS 0.0 06/07/2013 1152   CARDIAC ENZYMES Lab Results  Component Value Date   TROPONINI <0.30 06/07/2013    Scheduled Meds: . cefTRIAXone (ROCEPHIN)  IV  1 g Intravenous QHS  . colchicine  0.6 mg Oral Daily  . digoxin  0.125 mg Oral Daily  . diltiazem  180 mg Oral Daily  . guaiFENesin  600 mg Oral BID  . metoprolol tartrate  25 mg Oral BID  . potassium chloride  10 mEq Oral BID  . sodium chloride  3 mL Intravenous Q12H  . vancomycin  1,000 mg Intravenous Q24H   Continuous Infusions: . sodium chloride 50 mL/hr (06/19/13 0846)   PRN Meds:.sodium chloride, acetaminophen, ALPRAZolam, ondansetron (ZOFRAN) IV, sodium chloride, traMADol  Assessment/Plan: Pericardial effusion with cardiac tamponade-improved  S/P Pericardiocentesis  Hypertension  Mild native vessel coronary artery disease  Anemia of blood loss and iron deficiency  Anxiety  Weakness  S/P dual chamber pacemaker placement with revision  Paroxysmal atrial flutter/fibrillation - Now SR and V-paced  Moderate Left pleural effusion-drained  Left lung pneumonia Hyponatremia  Continue medical treatment with liberal salt intake. Home soon.    LOS: 8 days    Dixie Dials  MD  06/19/2013,  9:14 AM

## 2013-06-19 NOTE — Discharge Summary (Signed)
Physician Discharge Summary  Patient ID: Theresa Norton MRN: [id number] DOB/AGE: 05-25-1928 80 y.o.  Admit date: 06/11/2013 Discharge date: 06/19/2013  Admission Diagnoses: Acute Pericardial effusion with cardiac tamponade  Hypertension  Mild native vessel coronary artery disease  S/P Dual chamber pacemaker placement   S/P Displacement of pacemaker electrode lead  Discharge Diagnoses:  Principle Problem: * Acute Pericardial effusion with cardiac tamponade * H/O Complete heart block Displacement of pacemaker electrode lead S/P dual chamber pacemaker placement with revision S/P Pericardiocentesis  Hypertension  Mild native vessel coronary artery disease  Anemia of blood loss   Anxiety  Weakness  Paroxysmal atrial flutter/fibrillation   Moderate Left pleural effusion S/P Thoracentesis  Possible Left lung pneumonia  Possible left lung atelectasis Hyponatremia  Discharged Condition: fair  Hospital Course: 80 year old female with recent pacemaker placement for complete heart block has left sided chest pain increased with breathing and cough. Echocardiogram revealed large pericardial effusion with tamponade. 820 cc of bloody pericardial fluid was drained with significant relief on 06/11/2013. The pericardial drain was left in for 5 days with little subsequent pericardial fluid recovery. Colchicine was used to promote healing. CT of chest showed displacement of pacemaker lead to epicardial fat pad. Pacemaker lead revision was done by Dr. Rayann Heman on 06/13/2013.  She had developed gradually increasing moderate left pleural effusion with over one litre fluid recovery on thoracentesis by Dr. Lily Kocher on 06/16/2013. Possible left lung pneumonia was suspected post thoracentesis. IV vancomycin and Rocephin was started for possible gram positive cocci in cluster on gram stain of pleural fluid. All other cultures were negative. Patient had recurrent hypoxemia. Home oxygen and oral antibiotic were  arranged. Patient was not a candidate for anticoagulation for 1 month due to recent events, anemia  and unsteady gait.  Consults: cardiology and pulmonary/intensive care  Significant Diagnostic Studies: labs: Near normal WBC and platelets count. Stable Hgb of 9.2 to 10 g/dL. Low sodium of 124 meq improving to 129 on discharge. Creatine of 2.10 improved to 0.69. Troponin-I less than 0.30. AFB stain and cultures and body fluid cultures are negative at time of typing this report.  EKG showed atrial sensed ventricular paced rhythm, AV paced rhythm and susequently atrial fibrillation with V-paced rhythm.  Echocardiogram: Preserved LV systolic function with large pericardial effusion and tamponade physiology followed by small posterior pericardial effusion on follow up exam.  CT chest on 06/12/2013: Pacing device in place. The right ventricular lead penetrates the myocardium. There is associated hemopericardium with a drain in place.  Small to moderate bilateral pleural effusions, larger on the left.  Associated compressive atelectasis is much worse than left.  CT chest on 06/14/2013: 1. Slight increased size of the patient's bilateral effusions moderate on the left, small on the right  2. Stable pericardial effusion.  3. Stable findings otherwise within the chest. Atherosclerotic calcifications are appreciated.  Treatments: cardiac meds: metoprolol, diltiazem, digoxin, furosemide and Losartan and potassium. Antibiotics-Rocephin, Vancomycin followed by Levaquin. Home Oxygen, RN and PT.  Discharge Exam: Blood pressure 130/80, pulse 61, temperature 98.7 F (37.1 C), temperature source Oral, resp. rate 18, height 5\' 3"  (1.6 m), weight 70.081 kg (154 lb 8 oz), SpO2 91.00%. HEENT: [place]/AT, Eyes-Hazel, PERL, EOMI, Conjunctiva-Pale pink, Sclera-Non-icteric  Neck: No JVD, No bruit, Trachea midline.  Lungs: Crackles on left side as before.  Cardiac: Regular rhythm, normal S1 and S2, no S3. II/VI systolic  murmur.  Abdomen: Soft, non-tender.  Extremities: No edema present. No cyanosis. No clubbing.  CNS:  AxOx3, Cranial nerves grossly intact, moves all 4 extremities. Right handed.  Skin: Warm and dry.  Disposition: 01-Home or Self Care   Future Appointments Provider Department Dept Phone   06/25/2013 12:00 PM [facility] [place] [facility] [phone]   06/26/2013 11:15 AM Eulas Post, MD [place] [facility] [place]   09/04/2013 11:00 AM Thompson Grayer, MD [facility] Office [phone]       Medication List    STOP taking these medications       Rivaroxaban 20 MG Tabs tablet  Commonly known as:  XARELTO      TAKE these medications       acetaminophen 325 MG tablet  Commonly known as:  TYLENOL  Take 325 mg by mouth every 6 (six) hours as needed for mild pain.     ALPRAZolam 0.25 MG tablet  Commonly known as:  XANAX  Take 1 tablet (0.25 mg total) by mouth 2 (two) times daily as needed for anxiety.     colchicine 0.6 MG tablet  Take 1 tablet (0.6 mg total) by mouth daily.     digoxin 0.125 MG tablet  Commonly known as:  LANOXIN  Take 1 tablet (0.125 mg total) by mouth daily.     diltiazem 180 MG 24 hr capsule  Commonly known as:  CARDIZEM CD  Take 1 capsule (180 mg total) by mouth daily.     furosemide 20 MG tablet  Commonly known as:  LASIX  Take 1 tablet (20 mg total) by mouth every Monday, Wednesday, and Friday.  Start taking on:  06/20/2013     levofloxacin 500 MG tablet  Commonly known as:  LEVAQUIN  Take 1 tablet (500 mg total) by mouth daily.     losartan 100 MG tablet  Commonly known as:  COZAAR  Take 1 tablet (100 mg total) by mouth daily.     metoprolol 50 MG tablet  Commonly known as:  LOPRESSOR  Take 0.5 tablets (25 mg total) by mouth 2 (two) times daily.     potassium chloride 10 MEQ tablet  Commonly known as:  K-DUR  Take 1 tablet (10 mEq total) by mouth daily.           Follow-up  Information   Follow up with Eulas Post, MD. Schedule an appointment as soon as possible for a visit in 1 week.   Specialty:  Family Medicine   Contact information:   [place] [address] [phone]       Follow up with [facility], MD. Schedule an appointment as soon as possible for a visit in 1 week.   Specialty:  Cardiology   Contact information:   [place] [address] [phone]       Follow up with Thompson Grayer, MD. Schedule an appointment as soon as possible for a visit in 1 month.   Specialty:  Cardiology   Contact information:   [place] [address] [phone]       Signed: Birdie Riddle 06/19/2013, 6:31 PM

## 2013-06-20 DIAGNOSIS — I314 Cardiac tamponade: Secondary | ICD-10-CM | POA: Diagnosis not present

## 2013-06-20 DIAGNOSIS — Z950 Presence of cardiac pacemaker: Secondary | ICD-10-CM | POA: Diagnosis not present

## 2013-06-20 DIAGNOSIS — I2510 Atherosclerotic heart disease of native coronary artery without angina pectoris: Secondary | ICD-10-CM | POA: Diagnosis not present

## 2013-06-20 DIAGNOSIS — E871 Hypo-osmolality and hyponatremia: Secondary | ICD-10-CM | POA: Diagnosis not present

## 2013-06-21 DIAGNOSIS — Z48812 Encounter for surgical aftercare following surgery on the circulatory system: Secondary | ICD-10-CM | POA: Diagnosis not present

## 2013-06-21 DIAGNOSIS — I10 Essential (primary) hypertension: Secondary | ICD-10-CM | POA: Diagnosis not present

## 2013-06-21 DIAGNOSIS — Z7901 Long term (current) use of anticoagulants: Secondary | ICD-10-CM | POA: Diagnosis not present

## 2013-06-21 DIAGNOSIS — Z45018 Encounter for adjustment and management of other part of cardiac pacemaker: Secondary | ICD-10-CM | POA: Diagnosis not present

## 2013-06-21 DIAGNOSIS — D509 Iron deficiency anemia, unspecified: Secondary | ICD-10-CM | POA: Diagnosis not present

## 2013-06-21 DIAGNOSIS — I442 Atrioventricular block, complete: Secondary | ICD-10-CM | POA: Diagnosis not present

## 2013-06-23 DIAGNOSIS — Z48812 Encounter for surgical aftercare following surgery on the circulatory system: Secondary | ICD-10-CM | POA: Diagnosis not present

## 2013-06-23 DIAGNOSIS — Z45018 Encounter for adjustment and management of other part of cardiac pacemaker: Secondary | ICD-10-CM | POA: Diagnosis not present

## 2013-06-23 DIAGNOSIS — D509 Iron deficiency anemia, unspecified: Secondary | ICD-10-CM | POA: Diagnosis not present

## 2013-06-23 DIAGNOSIS — I442 Atrioventricular block, complete: Secondary | ICD-10-CM | POA: Diagnosis not present

## 2013-06-23 DIAGNOSIS — Z7901 Long term (current) use of anticoagulants: Secondary | ICD-10-CM | POA: Diagnosis not present

## 2013-06-23 DIAGNOSIS — I10 Essential (primary) hypertension: Secondary | ICD-10-CM | POA: Diagnosis not present

## 2013-06-23 LAB — MISCELLANEOUS TEST

## 2013-06-25 ENCOUNTER — Ambulatory Visit (INDEPENDENT_AMBULATORY_CARE_PROVIDER_SITE_OTHER): Payer: Medicare Other | Admitting: *Deleted

## 2013-06-25 ENCOUNTER — Encounter: Payer: Self-pay | Admitting: Internal Medicine

## 2013-06-25 DIAGNOSIS — I442 Atrioventricular block, complete: Secondary | ICD-10-CM

## 2013-06-25 DIAGNOSIS — D509 Iron deficiency anemia, unspecified: Secondary | ICD-10-CM | POA: Diagnosis not present

## 2013-06-25 DIAGNOSIS — Z950 Presence of cardiac pacemaker: Secondary | ICD-10-CM | POA: Diagnosis not present

## 2013-06-25 DIAGNOSIS — R5381 Other malaise: Secondary | ICD-10-CM

## 2013-06-25 DIAGNOSIS — R5383 Other fatigue: Secondary | ICD-10-CM | POA: Diagnosis not present

## 2013-06-25 DIAGNOSIS — Z79899 Other long term (current) drug therapy: Secondary | ICD-10-CM

## 2013-06-25 DIAGNOSIS — I4891 Unspecified atrial fibrillation: Secondary | ICD-10-CM

## 2013-06-25 DIAGNOSIS — Z7901 Long term (current) use of anticoagulants: Secondary | ICD-10-CM | POA: Diagnosis not present

## 2013-06-25 DIAGNOSIS — I2510 Atherosclerotic heart disease of native coronary artery without angina pectoris: Secondary | ICD-10-CM | POA: Diagnosis not present

## 2013-06-25 DIAGNOSIS — I441 Atrioventricular block, second degree: Secondary | ICD-10-CM | POA: Diagnosis not present

## 2013-06-25 DIAGNOSIS — I10 Essential (primary) hypertension: Secondary | ICD-10-CM | POA: Diagnosis not present

## 2013-06-25 DIAGNOSIS — Z48812 Encounter for surgical aftercare following surgery on the circulatory system: Secondary | ICD-10-CM | POA: Diagnosis not present

## 2013-06-25 DIAGNOSIS — Z45018 Encounter for adjustment and management of other part of cardiac pacemaker: Secondary | ICD-10-CM | POA: Diagnosis not present

## 2013-06-25 LAB — MDC_IDC_ENUM_SESS_TYPE_INCLINIC
Battery Impedance: 100 Ohm
Battery Remaining Longevity: 114 mo
Battery Voltage: 2.8 V
Brady Statistic AP VP Percent: 1 %
Brady Statistic AP VS Percent: 0 %
Brady Statistic AS VP Percent: 99 %
Brady Statistic AS VS Percent: 0 %
Date Time Interrogation Session: 20150218144828
Lead Channel Impedance Value: 409 Ohm
Lead Channel Impedance Value: 595 Ohm
Lead Channel Pacing Threshold Amplitude: 0.5 V
Lead Channel Pacing Threshold Amplitude: 1 V
Lead Channel Pacing Threshold Pulse Width: 0.4 ms
Lead Channel Pacing Threshold Pulse Width: 1 ms
Lead Channel Sensing Intrinsic Amplitude: 1.4 mV
Lead Channel Setting Pacing Amplitude: 3.5 V
Lead Channel Setting Pacing Amplitude: 3.5 V
Lead Channel Setting Pacing Pulse Width: 0.76 ms
Lead Channel Setting Sensing Sensitivity: 8 mV

## 2013-06-25 LAB — CBC
HCT: 31.1 % — ABNORMAL LOW (ref 36.0–46.0)
Hemoglobin: 9.9 g/dL — ABNORMAL LOW (ref 12.0–15.0)
MCHC: 31.8 g/dL (ref 30.0–36.0)
MCV: 95.6 fl (ref 78.0–100.0)
Platelets: 370 10*3/uL (ref 150.0–400.0)
RBC: 3.25 Mil/uL — ABNORMAL LOW (ref 3.87–5.11)
RDW: 15.5 % — ABNORMAL HIGH (ref 11.5–14.6)
WBC: 10.3 10*3/uL (ref 4.5–10.5)

## 2013-06-25 LAB — BASIC METABOLIC PANEL
BUN: 15 mg/dL (ref 6–23)
CO2: 24 mEq/L (ref 19–32)
Calcium: 8.6 mg/dL (ref 8.4–10.5)
Chloride: 95 mEq/L — ABNORMAL LOW (ref 96–112)
Creatinine, Ser: 1.3 mg/dL — ABNORMAL HIGH (ref 0.4–1.2)
GFR: 42.35 mL/min — ABNORMAL LOW (ref >60.00)
Glucose, Bld: 94 mg/dL (ref 70–99)
Potassium: 3.8 mEq/L (ref 3.5–5.1)
Sodium: 129 mEq/L — ABNORMAL LOW (ref 135–145)

## 2013-06-25 LAB — BODY FLUID CULTURE: Culture: NO GROWTH

## 2013-06-25 NOTE — Progress Notes (Signed)
Wound check appointment. Steri-strips removed. Wound without redness or edema. Incision edges approximated, wound well healed. Normal device function. Thresholds, sensing, and impedances consistent with implant measurements. RV threshold slightly higher than implant measurements---JA aware---higher PW programmed. Device programmed at 3.5V programmed  for extra safety margin until 3 month visit. Histogram distribution appropriate for patient and level of activity. 115 mode switches (20.9%)---22 AHR episodes---max dur. 28 hours, Max A >400, Max V 100----AF--unable to anticoagulate at this time per JA---previously on Xarelto. No high ventricular rates noted. Patient educated about wound care, arm mobility, lifting restrictions. ROV in 2 weeks with JA for symptoms related to procedure complications.

## 2013-06-26 ENCOUNTER — Encounter: Payer: Self-pay | Admitting: Family Medicine

## 2013-06-26 ENCOUNTER — Ambulatory Visit (INDEPENDENT_AMBULATORY_CARE_PROVIDER_SITE_OTHER): Payer: Medicare Other | Admitting: Family Medicine

## 2013-06-26 VITALS — BP 128/70 | HR 86 | Wt 154.0 lb

## 2013-06-26 DIAGNOSIS — D509 Iron deficiency anemia, unspecified: Secondary | ICD-10-CM | POA: Insufficient documentation

## 2013-06-26 DIAGNOSIS — I10 Essential (primary) hypertension: Secondary | ICD-10-CM

## 2013-06-26 DIAGNOSIS — R5381 Other malaise: Secondary | ICD-10-CM

## 2013-06-26 DIAGNOSIS — R5383 Other fatigue: Secondary | ICD-10-CM

## 2013-06-26 DIAGNOSIS — E871 Hypo-osmolality and hyponatremia: Secondary | ICD-10-CM | POA: Insufficient documentation

## 2013-06-26 NOTE — Progress Notes (Signed)
Subjective:    Patient ID: Theresa Norton, female    DOB: [birth year], 80 y.o.   MRN: [id number]  HPI Patient seen for hospital followup. She was admitted on 06/11/2013 and discharged on 06/19/2013.  Patient had recent pacemaker implanted for complete heart block back in January. She presented on this admission with left-sided chest pains and dyspnea along with cough. Echocardiogram revealed pericardial effusion with tamponade and 820 cc of bloody pericardial fluid removed. She was subsequently placed on colchicine. CT chest revealed displacement of pacemaker lead tip epicardial fat pad. Pacemaker was revised on 06/13/2013.  Patient also had moderate left pleural effusion with over 1 L of fluid removed by thoracentesis. Pneumonia was considered. She had Gram stain positive for gram-positive cocci in clusters.  Cultures proved to be negative. AFB and fungal studies were negative. Patient was initially treated with IV vancomycin and Rocephin and transitioned to Levaquin. At this time, she has occasional dry cough but no significant dyspnea and not denies any fever chills  She had hyponatremia which did improve on admission. Patient had acute renal failure and creatinine at the time of discharge had returned to normal. She had anemia with iron deficiency on recent labs in January. We had a comparison hemoglobin back in June of 2014 which was normal. She was discharged on iron replacement which she takes once daily.  She had creatinine back up to 1.3 on labs that were done per cardiology yesterday. She admits that she is not drinking a lot of fluids. Her sodium was 129.  He complains of some generalized fatigue issues since discharge. Appetite is slowly improving. Home health nursing and physical therapy.  She had poorly controlled blood pressure with recent visit to cardiology and low dose amlodipine was added to her other antihypertensive medications including losartan, diltiazem, and metoprolol.  No orthostasis.   Past Medical History  Diagnosis Date  . Arthritis   . Allergy   . Hypertension   . Colon polyps   . Heart block AV second degree     a. s/p MDT Addapta dual chamber pacemaker 05/2013 by Dr Rayann Heman.  Marland Kitchen PAF (paroxysmal atrial fibrillation)     a. Found post-pacer 05/2013->Xarelto added;  b. 05/2013 Echo: EF 60-65%, mild LVH, nl wall motion w/o rwma.  . Groin hematoma     a. 05/2013 R groin hematoma post-cath - u/s 05/31/13 large 7.49 cm r inguinal hematoma extending into thigh, no psa or avf.  . Anemia   . CAD (coronary artery disease)     a. 05/2013 nonobs dzs by cath.   Past Surgical History  Procedure Laterality Date  . Breast surgery  1959    cyst removal  . Bunionectomy  1967    both feet  . Pacemaker insertion  05/27/2013    MDT ADDRL1 implanted by Dr Rayann Heman for heart block    reports that she has never smoked. She does not have any smokeless tobacco history on file. She reports that she drinks alcohol. She reports that she does not use illicit drugs. family history includes Cancer in her father. Allergies  Allergen Reactions  . Codeine     GI upset  . Morphine And Related     GI upset      Review of Systems  Constitutional: Positive for fatigue. Negative for fever and chills.  Respiratory: Positive for cough. Negative for shortness of breath and wheezing.   Cardiovascular: Negative for chest pain, palpitations and leg swelling.  Gastrointestinal: Negative for abdominal  pain.  Endocrine: Negative for polydipsia and polyuria.  Genitourinary: Negative for dysuria.  Neurological: Negative for dizziness.       Objective:   Physical Exam  Constitutional: She is oriented to person, place, and time. She appears well-developed and well-nourished.  Neck: Neck supple. No thyromegaly present.  Cardiovascular: Normal rate and regular rhythm.   Pulmonary/Chest: Effort normal and breath sounds normal. No respiratory distress. She has no wheezes. She has no  rales.  Musculoskeletal: She exhibits no edema.  Neurological: She is alert and oriented to person, place, and time.          Assessment & Plan:  #1 history of complete heart block with pacemaker insertion # 2 hypertension which is controlled by today's reading. Denies any adverse side effects to medication #3 mild hyponatremia. Probably related to poor intake. Takes very low dose furosemide 20 mg 3 times per week. No thiazide use. #4 anemia probably related to recent bleeding events. She's not any prior history of anemia. Continue iron replacement and repeat CBC in one month #5 fatigue likely multifactorial. She has home physical therapy established.

## 2013-06-26 NOTE — Patient Instructions (Signed)
° °  Iron-Rich Diet ° °An iron-rich diet contains foods that are good sources of iron. Iron is an important mineral that helps your body produce hemoglobin. Hemoglobin is a protein in red blood cells that carries oxygen to the body's tissues. Sometimes, the iron level in your blood can be low. This may be caused by: °· A lack of iron in your diet. °· Blood loss. °· Times of growth, such as during pregnancy or during a child's growth and development. °Low levels of iron can cause a decrease in the number of red blood cells. This can result in iron deficiency anemia. Iron deficiency anemia symptoms include: °· Tiredness. °· Weakness. °· Irritability. °· Increased chance of infection. °Here are some recommendations for daily iron intake: °· Males older than 78 years of age need 8 mg of iron per day. °· Women ages 19 to 50 need 18 mg of iron per day. °· Pregnant women need 27 mg of iron per day, and women who are over 19 years of age and breastfeeding need 9 mg of iron per day. °· Women over the age of 50 need 8 mg of iron per day. °SOURCES OF IRON °There are 2 types of iron that are found in food: heme iron and nonheme iron. Heme iron is absorbed by the body better than nonheme iron. Heme iron is found in meat, poultry, and fish. Nonheme iron is found in grains, beans, and vegetables. °Heme Iron Sources °Food / Iron (mg) °· Chicken liver, 3 oz (85 g)/ 10 mg °· Beef liver, 3 oz (85 g)/ 5.5 mg °· Oysters, 3 oz (85 g)/ 8 mg °· Beef, 3 oz (85 g)/ 2 to 3 mg °· Shrimp, 3 oz (85 g)/ 2.8 mg °· Turkey, 3 oz (85 g)/ 2 mg °· Chicken, 3 oz (85 g) / 1 mg °· Fish (tuna, halibut), 3 oz (85 g)/ 1 mg °· Pork, 3 oz (85 g)/ 0.9 mg °Nonheme Iron Sources °Food / Iron (mg) °· Ready-to-eat breakfast cereal, iron-fortified / 3.9 to 7 mg °· Tofu, ½ cup / 3.4 mg °· Kidney beans, ½ cup / 2.6 mg °· Baked potato with skin / 2.7 mg °· Asparagus, ½ cup / 2.2 mg °· Avocado / 2 mg °· Dried peaches, ½ cup / 1.6 mg °· Raisins, ½ cup / 1.5 mg °· Soy milk,  1 cup / 1.5 mg °· Whole-wheat bread, 1 slice / 1.2 mg °· Spinach, 1 cup / 0.8 mg °· Broccoli, ½ cup / 0.6 mg °IRON ABSORPTION °Certain foods can decrease the body's absorption of iron. Try to avoid these foods and beverages while eating meals with iron-containing foods: °· Coffee. °· Tea. °· Fiber. °· Soy. °Foods containing vitamin C can help increase the amount of iron your body absorbs from iron sources, especially from nonheme sources. Eat foods with vitamin C along with iron-containing foods to increase your iron absorption. Foods that are high in vitamin C include many fruits and vegetables. Some good sources are: °· Fresh orange juice. °· Oranges. °· Strawberries. °· Mangoes. °· Grapefruit. °· Red bell peppers. °· Green bell peppers. °· Broccoli. °· Potatoes with skin. °· Tomato juice. °Document Released: 12/06/2004 Document Revised: 07/17/2011 Document Reviewed: 10/13/2010 °[employer]® Patient Information ©2014 [employer]. ° °

## 2013-06-26 NOTE — Progress Notes (Signed)
Pre visit review using our clinic review tool, if applicable. No additional management support is needed unless otherwise documented below in the visit note. 

## 2013-06-27 DIAGNOSIS — I442 Atrioventricular block, complete: Secondary | ICD-10-CM | POA: Diagnosis not present

## 2013-06-27 DIAGNOSIS — Z7901 Long term (current) use of anticoagulants: Secondary | ICD-10-CM | POA: Diagnosis not present

## 2013-06-27 DIAGNOSIS — D509 Iron deficiency anemia, unspecified: Secondary | ICD-10-CM | POA: Diagnosis not present

## 2013-06-27 DIAGNOSIS — Z48812 Encounter for surgical aftercare following surgery on the circulatory system: Secondary | ICD-10-CM | POA: Diagnosis not present

## 2013-06-27 DIAGNOSIS — I10 Essential (primary) hypertension: Secondary | ICD-10-CM | POA: Diagnosis not present

## 2013-06-27 DIAGNOSIS — Z45018 Encounter for adjustment and management of other part of cardiac pacemaker: Secondary | ICD-10-CM | POA: Diagnosis not present

## 2013-07-01 DIAGNOSIS — D509 Iron deficiency anemia, unspecified: Secondary | ICD-10-CM | POA: Diagnosis not present

## 2013-07-01 DIAGNOSIS — I10 Essential (primary) hypertension: Secondary | ICD-10-CM | POA: Diagnosis not present

## 2013-07-01 DIAGNOSIS — Z45018 Encounter for adjustment and management of other part of cardiac pacemaker: Secondary | ICD-10-CM | POA: Diagnosis not present

## 2013-07-01 DIAGNOSIS — Z48812 Encounter for surgical aftercare following surgery on the circulatory system: Secondary | ICD-10-CM | POA: Diagnosis not present

## 2013-07-01 DIAGNOSIS — I442 Atrioventricular block, complete: Secondary | ICD-10-CM | POA: Diagnosis not present

## 2013-07-01 DIAGNOSIS — Z7901 Long term (current) use of anticoagulants: Secondary | ICD-10-CM | POA: Diagnosis not present

## 2013-07-02 DIAGNOSIS — D509 Iron deficiency anemia, unspecified: Secondary | ICD-10-CM | POA: Diagnosis not present

## 2013-07-02 DIAGNOSIS — Z45018 Encounter for adjustment and management of other part of cardiac pacemaker: Secondary | ICD-10-CM | POA: Diagnosis not present

## 2013-07-02 DIAGNOSIS — I10 Essential (primary) hypertension: Secondary | ICD-10-CM | POA: Diagnosis not present

## 2013-07-02 DIAGNOSIS — I442 Atrioventricular block, complete: Secondary | ICD-10-CM | POA: Diagnosis not present

## 2013-07-02 DIAGNOSIS — Z48812 Encounter for surgical aftercare following surgery on the circulatory system: Secondary | ICD-10-CM | POA: Diagnosis not present

## 2013-07-02 DIAGNOSIS — Z7901 Long term (current) use of anticoagulants: Secondary | ICD-10-CM | POA: Diagnosis not present

## 2013-07-03 DIAGNOSIS — Z45018 Encounter for adjustment and management of other part of cardiac pacemaker: Secondary | ICD-10-CM | POA: Diagnosis not present

## 2013-07-03 DIAGNOSIS — Z7901 Long term (current) use of anticoagulants: Secondary | ICD-10-CM | POA: Diagnosis not present

## 2013-07-03 DIAGNOSIS — I10 Essential (primary) hypertension: Secondary | ICD-10-CM | POA: Diagnosis not present

## 2013-07-03 DIAGNOSIS — I442 Atrioventricular block, complete: Secondary | ICD-10-CM | POA: Diagnosis not present

## 2013-07-03 DIAGNOSIS — Z48812 Encounter for surgical aftercare following surgery on the circulatory system: Secondary | ICD-10-CM | POA: Diagnosis not present

## 2013-07-03 DIAGNOSIS — D509 Iron deficiency anemia, unspecified: Secondary | ICD-10-CM | POA: Diagnosis not present

## 2013-07-04 ENCOUNTER — Other Ambulatory Visit: Payer: Self-pay | Admitting: Cardiovascular Disease

## 2013-07-04 ENCOUNTER — Ambulatory Visit
Admission: RE | Admit: 2013-07-04 | Discharge: 2013-07-04 | Disposition: A | Discharge status: Home / Self Care | Payer: Medicare Other | Source: Ambulatory Visit | Attending: Cardiovascular Disease | Admitting: Cardiovascular Disease

## 2013-07-04 DIAGNOSIS — I059 Rheumatic mitral valve disease, unspecified: Secondary | ICD-10-CM | POA: Diagnosis not present

## 2013-07-04 DIAGNOSIS — J90 Pleural effusion, not elsewhere classified: Secondary | ICD-10-CM

## 2013-07-04 DIAGNOSIS — R0602 Shortness of breath: Secondary | ICD-10-CM | POA: Diagnosis not present

## 2013-07-04 DIAGNOSIS — I359 Nonrheumatic aortic valve disorder, unspecified: Secondary | ICD-10-CM | POA: Diagnosis not present

## 2013-07-04 DIAGNOSIS — J9819 Other pulmonary collapse: Secondary | ICD-10-CM | POA: Diagnosis not present

## 2013-07-04 DIAGNOSIS — I312 Hemopericardium, not elsewhere classified: Secondary | ICD-10-CM | POA: Diagnosis not present

## 2013-07-04 DIAGNOSIS — R0600 Dyspnea, unspecified: Secondary | ICD-10-CM

## 2013-07-07 ENCOUNTER — Encounter (HOSPITAL_COMMUNITY): Payer: Self-pay | Admitting: Radiology

## 2013-07-07 ENCOUNTER — Observation Stay (HOSPITAL_COMMUNITY): Payer: Medicare Other

## 2013-07-07 ENCOUNTER — Encounter: Payer: Self-pay | Admitting: Internal Medicine

## 2013-07-07 ENCOUNTER — Inpatient Hospital Stay (HOSPITAL_COMMUNITY)
Admission: AD | Admit: 2013-07-07 | Discharge: 2013-07-11 | DRG: 291 | Disposition: A | Discharge status: Home Health | Payer: Medicare Other | Source: Ambulatory Visit | Attending: Cardiovascular Disease | Admitting: Cardiovascular Disease

## 2013-07-07 ENCOUNTER — Encounter: Payer: Medicare Other | Admitting: *Deleted

## 2013-07-07 DIAGNOSIS — M129 Arthropathy, unspecified: Secondary | ICD-10-CM | POA: Diagnosis present

## 2013-07-07 DIAGNOSIS — D509 Iron deficiency anemia, unspecified: Secondary | ICD-10-CM | POA: Diagnosis not present

## 2013-07-07 DIAGNOSIS — F411 Generalized anxiety disorder: Secondary | ICD-10-CM | POA: Diagnosis present

## 2013-07-07 DIAGNOSIS — I2510 Atherosclerotic heart disease of native coronary artery without angina pectoris: Secondary | ICD-10-CM | POA: Diagnosis present

## 2013-07-07 DIAGNOSIS — D500 Iron deficiency anemia secondary to blood loss (chronic): Secondary | ICD-10-CM | POA: Diagnosis present

## 2013-07-07 DIAGNOSIS — I4891 Unspecified atrial fibrillation: Secondary | ICD-10-CM | POA: Diagnosis present

## 2013-07-07 DIAGNOSIS — J9600 Acute respiratory failure, unspecified whether with hypoxia or hypercapnia: Secondary | ICD-10-CM | POA: Diagnosis not present

## 2013-07-07 DIAGNOSIS — J90 Pleural effusion, not elsewhere classified: Secondary | ICD-10-CM | POA: Diagnosis present

## 2013-07-07 DIAGNOSIS — I459 Conduction disorder, unspecified: Secondary | ICD-10-CM | POA: Diagnosis present

## 2013-07-07 DIAGNOSIS — I509 Heart failure, unspecified: Principal | ICD-10-CM | POA: Diagnosis present

## 2013-07-07 DIAGNOSIS — I10 Essential (primary) hypertension: Secondary | ICD-10-CM | POA: Diagnosis present

## 2013-07-07 DIAGNOSIS — T85698A Other mechanical complication of other specified internal prosthetic devices, implants and grafts, initial encounter: Secondary | ICD-10-CM | POA: Diagnosis not present

## 2013-07-07 DIAGNOSIS — Z950 Presence of cardiac pacemaker: Secondary | ICD-10-CM

## 2013-07-07 DIAGNOSIS — R0602 Shortness of breath: Secondary | ICD-10-CM | POA: Diagnosis not present

## 2013-07-07 HISTORY — DX: Presence of cardiac pacemaker: Z95.0

## 2013-07-07 HISTORY — DX: Dependence on supplemental oxygen: Z99.81

## 2013-07-07 HISTORY — DX: Personal history of other diseases of the digestive system: Z87.19

## 2013-07-07 LAB — COMPREHENSIVE METABOLIC PANEL
ALT: 16 U/L (ref 0–35)
AST: 16 U/L (ref 0–37)
Albumin: 2.4 g/dL — ABNORMAL LOW (ref 3.5–5.2)
Alkaline Phosphatase: 93 U/L (ref 39–117)
BUN: 27 mg/dL — ABNORMAL HIGH (ref 6–23)
CO2: 20 mEq/L (ref 19–32)
Calcium: 8.5 mg/dL (ref 8.4–10.5)
Chloride: 94 mEq/L — ABNORMAL LOW (ref 96–112)
Creatinine, Ser: 0.81 mg/dL (ref 0.50–1.10)
GFR calc Af Amer: 76 mL/min — ABNORMAL LOW (ref >90)
GFR calc non Af Amer: 66 mL/min — ABNORMAL LOW (ref >90)
Glucose, Bld: 115 mg/dL — ABNORMAL HIGH (ref 70–99)
Potassium: 4.5 mEq/L (ref 3.7–5.3)
Sodium: 130 mEq/L — ABNORMAL LOW (ref 137–147)
Total Bilirubin: 0.5 mg/dL (ref 0.3–1.2)
Total Protein: 6.7 g/dL (ref 6.0–8.3)

## 2013-07-07 LAB — CBC WITH DIFFERENTIAL/PLATELET
Basophils Absolute: 0 10*3/uL (ref 0.0–0.1)
Basophils Relative: 0 % (ref 0–1)
Eosinophils Absolute: 0.1 10*3/uL (ref 0.0–0.7)
Eosinophils Relative: 0 % (ref 0–5)
HCT: 29.6 % — ABNORMAL LOW (ref 36.0–46.0)
Hemoglobin: 10.1 g/dL — ABNORMAL LOW (ref 12.0–15.0)
Lymphocytes Relative: 6 % — ABNORMAL LOW (ref 12–46)
Lymphs Abs: 0.8 10*3/uL (ref 0.7–4.0)
MCH: 29.8 pg (ref 26.0–34.0)
MCHC: 34.1 g/dL (ref 30.0–36.0)
MCV: 87.3 fL (ref 78.0–100.0)
Monocytes Absolute: 1.2 10*3/uL — ABNORMAL HIGH (ref 0.1–1.0)
Monocytes Relative: 8 % (ref 3–12)
Neutro Abs: 12.4 10*3/uL — ABNORMAL HIGH (ref 1.7–7.7)
Neutrophils Relative %: 85 % — ABNORMAL HIGH (ref 43–77)
Platelets: 380 10*3/uL (ref 150–400)
RBC: 3.39 MIL/uL — ABNORMAL LOW (ref 3.87–5.11)
RDW: 15.4 % (ref 11.5–15.5)
WBC: 14.5 10*3/uL — ABNORMAL HIGH (ref 4.0–10.5)

## 2013-07-07 LAB — PRO B NATRIURETIC PEPTIDE: Pro B Natriuretic peptide (BNP): 1705 pg/mL — ABNORMAL HIGH (ref 0–450)

## 2013-07-07 MED ORDER — DILTIAZEM HCL ER COATED BEADS 180 MG PO CP24
180.0000 mg | ORAL_CAPSULE | Freq: Every day | ORAL | Status: DC
  Administered 2013-07-07 – 2013-07-11 (×5): 180 mg via ORAL
  Filled 2013-07-07 (×5): qty 1

## 2013-07-07 MED ORDER — SODIUM CHLORIDE 0.9 % IJ SOLN
3.0000 mL | Freq: Two times a day (BID) | INTRAMUSCULAR | Status: DC
  Administered 2013-07-07 – 2013-07-09 (×4): 3 mL via INTRAVENOUS

## 2013-07-07 MED ORDER — CEFAZOLIN SODIUM-DEXTROSE 2-3 GM-% IV SOLR
2.0000 g | Freq: Once | INTRAVENOUS | Status: AC
  Administered 2013-07-07: 2 g via INTRAVENOUS
  Filled 2013-07-07: qty 50

## 2013-07-07 MED ORDER — FENTANYL CITRATE 0.05 MG/ML IJ SOLN
INTRAMUSCULAR | Status: AC | PRN
  Administered 2013-07-07 (×2): 50 ug via INTRAVENOUS

## 2013-07-07 MED ORDER — SODIUM CHLORIDE 0.9 % IV SOLN: 250.0000 mL | INTRAVENOUS | Status: DC | PRN

## 2013-07-07 MED ORDER — DIGOXIN 125 MCG PO TABS
0.1250 mg | ORAL_TABLET | Freq: Every day | ORAL | Status: DC
  Administered 2013-07-07 – 2013-07-11 (×5): 0.125 mg via ORAL
  Filled 2013-07-07 (×5): qty 1

## 2013-07-07 MED ORDER — MIDAZOLAM HCL 2 MG/2ML IJ SOLN
INTRAMUSCULAR | Status: AC | PRN
  Administered 2013-07-07: 2 mg via INTRAVENOUS

## 2013-07-07 MED ORDER — MIDAZOLAM HCL 2 MG/2ML IJ SOLN
INTRAMUSCULAR | Status: AC
  Filled 2013-07-07: qty 4

## 2013-07-07 MED ORDER — POTASSIUM CHLORIDE ER 10 MEQ PO TBCR
10.0000 meq | EXTENDED_RELEASE_TABLET | Freq: Every day | ORAL | Status: DC
  Administered 2013-07-07: 10 meq via ORAL
  Filled 2013-07-07: qty 1

## 2013-07-07 MED ORDER — SODIUM CHLORIDE 0.9 % IJ SOLN
3.0000 mL | Freq: Two times a day (BID) | INTRAMUSCULAR | Status: DC
  Administered 2013-07-07 – 2013-07-10 (×5): 3 mL via INTRAVENOUS
  Administered 2013-07-11: 12:00:00 via INTRAVENOUS

## 2013-07-07 MED ORDER — HYDROCODONE-ACETAMINOPHEN 5-325 MG PO TABS
1.0000 | ORAL_TABLET | Freq: Four times a day (QID) | ORAL | Status: DC | PRN
  Administered 2013-07-07 – 2013-07-11 (×11): 1 via ORAL
  Filled 2013-07-07 (×11): qty 1

## 2013-07-07 MED ORDER — HEPARIN SODIUM (PORCINE) 5000 UNIT/ML IJ SOLN
5000.0000 [IU] | Freq: Three times a day (TID) | INTRAMUSCULAR | Status: DC
  Administered 2013-07-07 – 2013-07-09 (×7): 5000 [IU] via SUBCUTANEOUS
  Filled 2013-07-07 (×16): qty 1

## 2013-07-07 MED ORDER — ALPRAZOLAM 0.25 MG PO TABS
0.2500 mg | ORAL_TABLET | Freq: Two times a day (BID) | ORAL | Status: DC | PRN
  Administered 2013-07-09 – 2013-07-10 (×2): 0.25 mg via ORAL
  Filled 2013-07-07 (×2): qty 1

## 2013-07-07 MED ORDER — ADULT MULTIVITAMIN W/MINERALS CH
1.0000 | ORAL_TABLET | Freq: Every day | ORAL | Status: DC
  Administered 2013-07-07 – 2013-07-11 (×5): 1 via ORAL
  Filled 2013-07-07 (×5): qty 1

## 2013-07-07 MED ORDER — PANTOPRAZOLE SODIUM 40 MG IV SOLR
40.0000 mg | Freq: Every day | INTRAVENOUS | Status: DC
  Administered 2013-07-07: 40 mg via INTRAVENOUS
  Filled 2013-07-07 (×2): qty 40

## 2013-07-07 MED ORDER — LOSARTAN POTASSIUM 50 MG PO TABS
100.0000 mg | ORAL_TABLET | Freq: Every day | ORAL | Status: DC
  Administered 2013-07-07: 100 mg via ORAL
  Filled 2013-07-07: qty 2

## 2013-07-07 MED ORDER — FUROSEMIDE 20 MG PO TABS
20.0000 mg | ORAL_TABLET | ORAL | Status: DC
  Administered 2013-07-07: 20 mg via ORAL
  Filled 2013-07-07: qty 1

## 2013-07-07 MED ORDER — SODIUM CHLORIDE 0.9 % IJ SOLN: 3.0000 mL | INTRAMUSCULAR | Status: DC | PRN

## 2013-07-07 MED ORDER — AMLODIPINE BESYLATE 2.5 MG PO TABS
2.5000 mg | ORAL_TABLET | Freq: Every day | ORAL | Status: DC
Start: 2013-07-07 — End: 2013-07-07
  Administered 2013-07-07: 2.5 mg via ORAL
  Filled 2013-07-07: qty 1

## 2013-07-07 MED ORDER — METOPROLOL TARTRATE 25 MG PO TABS
25.0000 mg | ORAL_TABLET | Freq: Two times a day (BID) | ORAL | Status: DC
  Administered 2013-07-07 – 2013-07-09 (×5): 25 mg via ORAL
  Filled 2013-07-07 (×6): qty 1

## 2013-07-07 MED ORDER — CEFAZOLIN SODIUM-DEXTROSE 2-3 GM-% IV SOLR
INTRAVENOUS | Status: AC
  Filled 2013-07-07: qty 50

## 2013-07-07 MED ORDER — FENTANYL CITRATE 0.05 MG/ML IJ SOLN
INTRAMUSCULAR | Status: AC
  Filled 2013-07-07: qty 4

## 2013-07-07 MED ORDER — COLCHICINE 0.6 MG PO TABS
0.6000 mg | ORAL_TABLET | Freq: Every day | ORAL | Status: DC
  Administered 2013-07-07 – 2013-07-11 (×5): 0.6 mg via ORAL
  Filled 2013-07-07 (×5): qty 1

## 2013-07-07 NOTE — Consult Note (Signed)
HPI: Theresa Norton is an 80 y.o. female with CHF, Afib, recently placed pacemaker and pericardial effusion last month. She also had large left pleural effusion treated by thoracentesis. She now has recurrent SOB and large left pleural effusion and has been admitted. IR is asked to place left sided chest tube for decompression of effusion. Chart, PMHx, meds, labs reviewed.  Past Medical History:  Past Medical History  Diagnosis Date  . Arthritis   . Allergy   . Hypertension   . Colon polyps   . Heart block AV second degree     a. s/p MDT Addapta dual chamber pacemaker 05/2013 by Dr Rayann Heman.  Marland Kitchen PAF (paroxysmal atrial fibrillation)     a. Found post-pacer 05/2013->Xarelto added;  b. 05/2013 Echo: EF 60-65%, mild LVH, nl wall motion w/o rwma.  . Groin hematoma     a. 05/2013 R groin hematoma post-cath - u/s 05/31/13 large 7.49 cm r inguinal hematoma extending into thigh, no psa or avf.  . Anemia   . CAD (coronary artery disease)     a. 05/2013 nonobs dzs by cath.    Past Surgical History:  Past Surgical History  Procedure Laterality Date  . Breast surgery  1959    cyst removal  . Bunionectomy  1967    both feet  . Pacemaker insertion  05/27/2013    MDT ADDRL1 implanted by Dr Rayann Heman for heart block    Family History:  Family History  Problem Relation Age of Onset  . Cancer Father     lung    Social History:  reports that she has never smoked. She does not have any smokeless tobacco history on file. She reports that she drinks alcohol. She reports that she does not use illicit drugs.  Allergies:  Allergies  Allergen Reactions  . Codeine     GI upset  . Morphine And Related     GI upset    Medications:   Medication List    ASK your doctor about these medications       acetaminophen 325 MG tablet  Commonly known as:  TYLENOL  Take 325 mg by mouth every 6 (six) hours as needed for mild pain.     ALPRAZolam 0.25 MG tablet  Commonly known as:  XANAX  Take 1 tablet  (0.25 mg total) by mouth 2 (two) times daily as needed for anxiety.     amLODipine 5 MG tablet  Commonly known as:  NORVASC  Take 2.5 mg by mouth daily.     colchicine 0.6 MG tablet  Take 1 tablet (0.6 mg total) by mouth daily.     digoxin 0.125 MG tablet  Commonly known as:  LANOXIN  Take 1 tablet (0.125 mg total) by mouth daily.     diltiazem 180 MG 24 hr capsule  Commonly known as:  CARDIZEM CD  Take 1 capsule (180 mg total) by mouth daily.     furosemide 20 MG tablet  Commonly known as:  LASIX  Take 1 tablet (20 mg total) by mouth every Monday, Wednesday, and Friday.     IRON PO  Take by mouth daily.     losartan 100 MG tablet  Commonly known as:  COZAAR  Take 1 tablet (100 mg total) by mouth daily.     metoprolol 50 MG tablet  Commonly known as:  LOPRESSOR  Take 0.5 tablets (25 mg total) by mouth 2 (two) times daily.     potassium chloride 10 MEQ tablet  Commonly known as:  K-DUR  Take 1 tablet (10 mEq total) by mouth daily.        Please HPI for pertinent positives, otherwise complete 10 system ROS negative.  Physical Exam: BP 114/75  Pulse 114  Resp 20  SpO2 95% There is no weight on file to calculate BMI.   General Appearance:  Alert, cooperative, no distress, appears stated age  Head:  Normocephalic, without obvious abnormality, atraumatic  ENT: Unremarkable  Neck: Supple, symmetrical, trachea midline  Lungs:   Diminished left sided BS. No wheezing/rhonchi. Respirations unlabored without use of accessory muscles.  Chest Wall:  No tenderness or deformity  Heart:  Regular rate and rhythm, S1, S2 normal, no murmur, rub or gallop.  Abdomen:   Soft, non-tender, non distended.  Neurologic: Normal affect, no gross deficits.   Results for orders placed during the hospital encounter of 07/07/13 (from the past 48 hour(s))  COMPREHENSIVE METABOLIC PANEL     Status: Abnormal   Collection Time    07/07/13  9:13 AM      Result Value Ref Range   Sodium 130 (*)  137 - 147 mEq/L   Potassium 4.5  3.7 - 5.3 mEq/L   Chloride 94 (*) 96 - 112 mEq/L   CO2 20  19 - 32 mEq/L   Glucose, Bld 115 (*) 70 - 99 mg/dL   BUN 27 (*) 6 - 23 mg/dL   Creatinine, Ser 0.81  0.50 - 1.10 mg/dL   Calcium 8.5  8.4 - 10.5 mg/dL   Total Protein 6.7  6.0 - 8.3 g/dL   Albumin 2.4 (*) 3.5 - 5.2 g/dL   AST 16  0 - 37 U/L   ALT 16  0 - 35 U/L   Alkaline Phosphatase 93  39 - 117 U/L   Total Bilirubin 0.5  0.3 - 1.2 mg/dL   GFR calc non Af Amer 66 (*) >90 mL/min   GFR calc Af Amer 76 (*) >90 mL/min   Comment: (NOTE)     The eGFR has been calculated using the CKD EPI equation.     This calculation has not been validated in all clinical situations.     eGFR's persistently <90 mL/min signify possible Chronic Kidney     Disease.  CBC WITH DIFFERENTIAL     Status: Abnormal   Collection Time    07/07/13  9:13 AM      Result Value Ref Range   WBC 14.5 (*) 4.0 - 10.5 K/uL   RBC 3.39 (*) 3.87 - 5.11 MIL/uL   Hemoglobin 10.1 (*) 12.0 - 15.0 g/dL   HCT 29.6 (*) 36.0 - 46.0 %   MCV 87.3  78.0 - 100.0 fL   MCH 29.8  26.0 - 34.0 pg   MCHC 34.1  30.0 - 36.0 g/dL   RDW 15.4  11.5 - 15.5 %   Platelets 380  150 - 400 K/uL   Neutrophils Relative % 85 (*) 43 - 77 %   Neutro Abs 12.4 (*) 1.7 - 7.7 K/uL   Lymphocytes Relative 6 (*) 12 - 46 %   Lymphs Abs 0.8  0.7 - 4.0 K/uL   Monocytes Relative 8  3 - 12 %   Monocytes Absolute 1.2 (*) 0.1 - 1.0 K/uL   Eosinophils Relative 0  0 - 5 %   Eosinophils Absolute 0.1  0.0 - 0.7 K/uL   Basophils Relative 0  0 - 1 %   Basophils Absolute 0.0  0.0 - 0.1  K/uL  PRO B NATRIURETIC PEPTIDE     Status: Abnormal   Collection Time    07/07/13  9:13 AM      Result Value Ref Range   Pro B Natriuretic peptide (BNP) 1705.0 (*) 0 - 450 pg/mL   X-ray Chest Pa And Lateral   07/07/2013   CLINICAL DATA:  Shortness of breath  EXAM: CHEST  2 VIEW  COMPARISON:  DG CHEST 2 VIEW dated 07/04/2013  FINDINGS: Low lung volumes. A left chest wall dual chamber cardiac  pacing unit is appreciated lead tips projecting in the region the right atrium and right ventricle. The cardiac silhouette is mild-to-moderately enlarged. A stable left pleural effusion is appreciated. There is blunting of the right costophrenic angle. No new focal regions of consolidation no new focal infiltrates. The osseous structures are unremarkable.  IMPRESSION: Low lung volumes otherwise stable left pleural effusion and likely trace right effusion. No evidence of acute cardiopulmonary disease.   Electronically Signed   By: Margaree Mackintosh M.D.   On: 07/07/2013 10:07    Assessment/Plan Recurrent left pleural effusion. Discussed placement of pigtail chest tube with pt and family. Explained procedure, risks, complications, use of sedation. Labs reviewed. Consent signed in chart  Ascencion Dike PA-C 07/07/2013, 11:20 AM

## 2013-07-07 NOTE — Progress Notes (Signed)
HPI The patient is an 80 yo woman with a h/o symptomatic bradycardia who underwent PPM insertion in January of this year. She presented for routing device/wound check and was subsequently found to have atrial fibrillation with a controlled VR. She has not had palpitations or syncope and no significant increase in dyspnea.  Allergies  Allergen Reactions  . Codeine     GI upset  . Morphine And Related     GI upset     No current facility-administered medications for this visit.   No current outpatient prescriptions on file.   Facility-Administered Medications Ordered in Other Visits  Medication Dose Route Frequency Provider Last Rate Last Dose  . 0.9 %  sodium chloride infusion  250 mL Intravenous PRN Birdie Riddle, MD      . ALPRAZolam Duanne Moron) tablet 0.25 mg  0.25 mg Oral BID PRN Birdie Riddle, MD      . amLODipine (NORVASC) tablet 2.5 mg  2.5 mg Oral Daily Birdie Riddle, MD      . colchicine tablet 0.6 mg  0.6 mg Oral Daily Birdie Riddle, MD      . digoxin (LANOXIN) tablet 0.125 mg  0.125 mg Oral Daily Birdie Riddle, MD      . diltiazem (CARDIZEM CD) 24 hr capsule 180 mg  180 mg Oral Daily Birdie Riddle, MD      . furosemide (LASIX) tablet 20 mg  20 mg Oral Q M,W,F Birdie Riddle, MD      . heparin injection 5,000 Units  5,000 Units Subcutaneous 3 times per day Birdie Riddle, MD      . losartan (COZAAR) tablet 100 mg  100 mg Oral Daily Birdie Riddle, MD      . metoprolol tartrate (LOPRESSOR) tablet 25 mg  25 mg Oral BID Birdie Riddle, MD      . multivitamin with minerals tablet 1 tablet  1 tablet Oral Daily Birdie Riddle, MD      . potassium chloride (K-DUR) CR tablet 10 mEq  10 mEq Oral Daily Birdie Riddle, MD      . sodium chloride 0.9 % injection 3 mL  3 mL Intravenous Q12H Birdie Riddle, MD      . sodium chloride 0.9 % injection 3 mL  3 mL Intravenous Q12H Birdie Riddle, MD      . sodium chloride 0.9 % injection 3 mL  3 mL Intravenous PRN Birdie Riddle, MD          Past Medical History  Diagnosis Date  . Arthritis   . Allergy   . Hypertension   . Colon polyps   . Heart block AV second degree     a. s/p MDT Addapta dual chamber pacemaker 05/2013 by Dr Rayann Heman.  Marland Kitchen PAF (paroxysmal atrial fibrillation)     a. Found post-pacer 05/2013->Xarelto added;  b. 05/2013 Echo: EF 60-65%, mild LVH, nl wall motion w/o rwma.  . Groin hematoma     a. 05/2013 R groin hematoma post-cath - u/s 05/31/13 large 7.49 cm r inguinal hematoma extending into thigh, no psa or avf.  . Anemia   . CAD (coronary artery disease)     a. 05/2013 nonobs dzs by cath.    ROS:   All systems reviewed and negative except as noted in the HPI.   Past Surgical History  Procedure Laterality Date  . Breast surgery  1959    cyst  removal  . Bunionectomy  1967    both feet  . Pacemaker insertion  05/27/2013    MDT ADDRL1 implanted by Dr Rayann Heman for heart block     Family History  Problem Relation Age of Onset  . Cancer Father     lung     History   Social History  . Marital Status: Married    Spouse Name: N/A    Number of Children: N/A  . Years of Education: N/A   Occupational History  . Not on file.   Social History Main Topics  . Smoking status: Never Smoker   . Smokeless tobacco: Not on file  . Alcohol Use: Yes     Comment: daily  . Drug Use: No  . Sexual Activity: Not on file   Other Topics Concern  . Not on file   Social History Narrative  . No narrative on file      Physical Exam: BP - 130/85, p- 74, R- 18 Well appearing elderly woman, NAD HEENT: Unremarkable Neck:  No JVD, no thyromegally Back:  No CVA tenderness Lungs:  Clear with no wheezes HEART:  IRegular rate rhythm, no murmurs, no rubs, no clicks Abd:  soft, positive bowel sounds, no organomegally, no rebound, no guarding Ext:  2 plus pulses, no edema, no cyanosis, no clubbing Skin:  No rashes no nodules Neuro:  CN II through XII intact, motor grossly intact   DEVICE  Normal device  function.  See PaceArt for details. Underlying atrial fibrillation is noted.  Assess/Plan:

## 2013-07-07 NOTE — Procedures (Signed)
Successful [place] AND FLUORO LT 10FR CHEST TUBE INSERTION NO COMP STABLE KEEP TO LWS AT 20CM H2O FULL REPORT IN PACS

## 2013-07-07 NOTE — H&P (Signed)
Theresa Norton is an 80 y.o. female.   Chief Complaint: Shortness of breath HPI: 80 year old female with recent pacemaker revision (06/14/2013 by Dr. Rayann Heman) had pericardial fluid drained on 06/11/2013. Then she had large pleural effusion drained on 06/16/2013 by Dr. Titus Mould. Now she has moderate to large pleural fluid re-accumulating along with weakness and shortness of breath.  Past Medical History  Diagnosis Date  . Arthritis   . Allergy   . Hypertension   . Colon polyps   . Heart block AV second degree     a. s/p MDT Addapta dual chamber pacemaker 05/2013 by Dr Rayann Heman.  Marland Kitchen PAF (paroxysmal atrial fibrillation)     a. Found post-pacer 05/2013->Xarelto added;  b. 05/2013 Echo: EF 60-65%, mild LVH, nl wall motion w/o rwma.  . Groin hematoma     a. 05/2013 R groin hematoma post-cath - u/s 05/31/13 large 7.49 cm r inguinal hematoma extending into thigh, no psa or avf.  . Anemia   . CAD (coronary artery disease)     a. 05/2013 nonobs dzs by cath.      Past Surgical History  Procedure Laterality Date  . Breast surgery  1959    cyst removal  . Bunionectomy  1967    both feet  . Pacemaker insertion  05/27/2013    MDT ADDRL1 implanted by Dr Rayann Heman for heart block    Family History  Problem Relation Age of Onset  . Cancer Father     lung   Social History:  reports that she has never smoked. She does not have any smokeless tobacco history on file. She reports that she drinks alcohol. She reports that she does not use illicit drugs.  Allergies:  Allergies  Allergen Reactions  . Codeine     GI upset  . Morphine And Related     GI upset    Medications Prior to Admission  Medication Sig Dispense Refill  . acetaminophen (TYLENOL) 325 MG tablet Take 325 mg by mouth every 6 (six) hours as needed for mild pain.      Marland Kitchen ALPRAZolam (XANAX) 0.25 MG tablet Take 1 tablet (0.25 mg total) by mouth 2 (two) times daily as needed for anxiety.  60 tablet  0  . amLODipine (NORVASC) 5 MG tablet Take 2.5  mg by mouth daily.      . colchicine 0.6 MG tablet Take 1 tablet (0.6 mg total) by mouth daily.  30 tablet  0  . digoxin (LANOXIN) 0.125 MG tablet Take 1 tablet (0.125 mg total) by mouth daily.  30 tablet  2  . diltiazem (CARDIZEM CD) 180 MG 24 hr capsule Take 1 capsule (180 mg total) by mouth daily.  30 capsule  3  . furosemide (LASIX) 20 MG tablet Take 1 tablet (20 mg total) by mouth every Monday, Wednesday, and Friday.  15 tablet  3  . IRON PO Take by mouth daily.      Marland Kitchen losartan (COZAAR) 100 MG tablet Take 1 tablet (100 mg total) by mouth daily.  30 tablet  11  . metoprolol (LOPRESSOR) 50 MG tablet Take 0.5 tablets (25 mg total) by mouth 2 (two) times daily.  60 tablet  3  . potassium chloride (K-DUR) 10 MEQ tablet Take 1 tablet (10 mEq total) by mouth daily.  30 tablet  1    No results found for this or any previous visit (from the past 48 hour(s)). No results found.  ROS Constitutional: Negative for fever and chills.  HENT: Negative.  Respiratory: Positive for cough and shortness of breath.  Cardiovascular: Positive for chest pain.  Gastrointestinal: Negative.  Musculoskeletal: Negative.  Skin: Negative.  Neurological: Positive for dizziness  Blood pressure 114/75, pulse 114, resp. rate 20, SpO2 95.00%.  HEENT: [place]/AT, Eyes-[place], PERL, EOMI, Conjunctiva-Pale pink, Sclera-Non-icteric  Neck: No JVD, No bruit, Trachea midline.  Lungs: Clear with left sided dullness. Left pectoral pacer pocket-stable, no erythema or drainage..  Cardiac: Regular rhythm, soft S1 and S2, no S3. II/VI systolic murmur. Abdomen: Soft, non-tender.  Extremities: No edema present. No cyanosis. No clubbing.   CNS: AxOx3, Cranial nerves grossly intact, moves all 4 extremities. Right handed.  Skin: Warm and dry.  Assessment/Plan Dyspnea and weakness H/O Complete heart block  S/P dual chamber pacemaker placement with revision  S/P Pericardiocentesis  Hypertension  Mild native vessel coronary artery  disease  Anemia of blood loss  Anxiety  Weakness  Paroxysmal atrial flutter/fibrillation  Moderate Left pleural effusion  S/P Thoracentesis  R/O Left lung pneumonia  R/O left lung atelectasis  Thoracentesis Blood work Oxygen Home medications.  Kim Lauver S 07/07/2013, 9:32 AM

## 2013-07-07 NOTE — Progress Notes (Signed)
Patient arrived, direct admit. Daughter at bedside. VS stable, patient assessed. Able to get IV access. Patient stable at this time. Patient  awaiting MD. Roxan Hockey

## 2013-07-07 NOTE — Sedation Documentation (Signed)
Patient denies pain and is resting comfortably.  

## 2013-07-07 NOTE — Assessment & Plan Note (Signed)
This is a new problem and the patient was started on Xarelto. She will require careful followup. She is on rate controlling medications. She is minimally symptomatic.

## 2013-07-07 NOTE — Progress Notes (Signed)
Patient arrived back from chest tube placement. Patient seems to be tolerating chest tube well. Some coughing once hooked to suction. Suction set at 20. Chest tube has already drained 1500 mL of yellow fluid. MD notified. BP stable. Will continue to monitor closely. Elmarie Shiley R

## 2013-07-07 NOTE — Assessment & Plan Note (Signed)
Her blood pressure was controlled today. Will recheck in several months.

## 2013-07-08 ENCOUNTER — Inpatient Hospital Stay (HOSPITAL_COMMUNITY): Payer: Medicare Other

## 2013-07-08 ENCOUNTER — Encounter (HOSPITAL_COMMUNITY): Payer: Self-pay | Admitting: General Practice

## 2013-07-08 DIAGNOSIS — J90 Pleural effusion, not elsewhere classified: Secondary | ICD-10-CM | POA: Diagnosis not present

## 2013-07-08 DIAGNOSIS — D509 Iron deficiency anemia, unspecified: Secondary | ICD-10-CM | POA: Diagnosis not present

## 2013-07-08 DIAGNOSIS — Z950 Presence of cardiac pacemaker: Secondary | ICD-10-CM | POA: Diagnosis not present

## 2013-07-08 DIAGNOSIS — I2510 Atherosclerotic heart disease of native coronary artery without angina pectoris: Secondary | ICD-10-CM | POA: Diagnosis not present

## 2013-07-08 DIAGNOSIS — I509 Heart failure, unspecified: Secondary | ICD-10-CM | POA: Diagnosis not present

## 2013-07-08 DIAGNOSIS — I4891 Unspecified atrial fibrillation: Secondary | ICD-10-CM | POA: Diagnosis not present

## 2013-07-08 DIAGNOSIS — J9600 Acute respiratory failure, unspecified whether with hypoxia or hypercapnia: Secondary | ICD-10-CM | POA: Diagnosis not present

## 2013-07-08 LAB — URINALYSIS, ROUTINE W REFLEX MICROSCOPIC
Bilirubin Urine: NEGATIVE
Glucose, UA: NEGATIVE mg/dL
Hgb urine dipstick: NEGATIVE
Ketones, ur: 15 mg/dL — AB
Nitrite: NEGATIVE
Protein, ur: NEGATIVE mg/dL
Specific Gravity, Urine: 1.021 (ref 1.005–1.030)
Urobilinogen, UA: 0.2 mg/dL (ref 0.0–1.0)
pH: 5.5 (ref 5.0–8.0)

## 2013-07-08 LAB — DIGOXIN LEVEL: Digoxin Level: 1.3 ng/mL (ref 0.8–2.0)

## 2013-07-08 LAB — BASIC METABOLIC PANEL
BUN: 26 mg/dL — ABNORMAL HIGH (ref 6–23)
CO2: 23 mEq/L (ref 19–32)
Calcium: 8.6 mg/dL (ref 8.4–10.5)
Chloride: 96 mEq/L (ref 96–112)
Creatinine, Ser: 1.03 mg/dL (ref 0.50–1.10)
GFR calc Af Amer: 57 mL/min — ABNORMAL LOW (ref >90)
GFR calc non Af Amer: 49 mL/min — ABNORMAL LOW (ref >90)
Glucose, Bld: 95 mg/dL (ref 70–99)
Potassium: 5.1 mEq/L (ref 3.7–5.3)
Sodium: 133 mEq/L — ABNORMAL LOW (ref 137–147)

## 2013-07-08 LAB — URINE MICROSCOPIC-ADD ON

## 2013-07-08 MED ORDER — PANTOPRAZOLE SODIUM 40 MG PO TBEC
40.0000 mg | DELAYED_RELEASE_TABLET | Freq: Every day | ORAL | Status: DC
  Administered 2013-07-08 – 2013-07-11 (×4): 40 mg via ORAL
  Filled 2013-07-08 (×3): qty 1

## 2013-07-08 NOTE — Progress Notes (Signed)
Patient evaluated for community based chronic disease management services with [facility] as a benefit of patient's [employer]. Spoke with patient's daughter Abagale Boulos [phone]) to explain [facility] services.  Past medical history includes HTN, CAD, and Anemia.  Admitted on 3.2.15 with pleural effusion and near syncope.  Verbal consent for services has been received. Daughter is primary contact.  Patient will receive a post discharge transition of care call and will be evaluated for monthly home visits for assessments and disease process education.  Left contact information and [facility] literature at bedside for daughter. Made Inpatient Case Manager aware that [facility] following. Of note, [facility] Care Management services does not replace or interfere with any services that are arranged by inpatient case management or social work.  For additional questions or referrals please contact Corliss Blacker BSN RN [facility] Liaison at [phone].

## 2013-07-08 NOTE — Progress Notes (Signed)
Subjective: Pt with some discomfort at left chest tube insertion site; denies increased dyspnea; occ cough  Objective: Vital signs in last 24 hours: Temp:  [97.4 F (36.3 C)-98.6 F (37 C)] 98.6 F (37 C) (03/03 0541) Pulse Rate:  [86-109] 86 (03/03 0541) Resp:  [16-25] 18 (03/03 0541) BP: (110-124)/(63-79) 124/79 mmHg (03/03 0541) SpO2:  [95 %-100 %] 99 % (03/03 0541) Weight:  [146 lb 6.2 oz (66.4 kg)] 146 lb 6.2 oz (66.4 kg) (03/02 1520) Last BM Date: 07/07/13  Intake/Output from previous day: 03/02 0701 - 03/03 0700 In: 240 [P.O.:240] Out: 1810 [Chest Tube:1810] Intake/Output this shift: Total I/O In: 240 [P.O.:240] Out: 720 [Urine:700; Chest Tube:20]  Left chest drain intact, output since yesterday 1.8 liters yellow fluid; no air leak  Lab Results:   Recent Labs  07/07/13 0913  WBC 14.5*  HGB 10.1*  HCT 29.6*  PLT 380   BMET  Recent Labs  07/07/13 0913 07/08/13 0332  NA 130* 133*  K 4.5 5.1  CL 94* 96  CO2 20 23  GLUCOSE 115* 95  BUN 27* 26*  CREATININE 0.81 1.03  CALCIUM 8.5 8.6   PT/INR No results found for this basename: LABPROT, INR,  in the last 72 hours ABG No results found for this basename: PHART, PCO2, PO2, HCO3,  in the last 72 hours  Studies/Results: X-ray Chest Pa And Lateral   07/07/2013   CLINICAL DATA:  Shortness of breath  EXAM: CHEST  2 VIEW  COMPARISON:  DG CHEST 2 VIEW dated 07/04/2013  FINDINGS: Low lung volumes. A left chest wall dual chamber cardiac pacing unit is appreciated lead tips projecting in the region the right atrium and right ventricle. The cardiac silhouette is mild-to-moderately enlarged. A stable left pleural effusion is appreciated. There is blunting of the right costophrenic angle. No new focal regions of consolidation no new focal infiltrates. The osseous structures are unremarkable.  IMPRESSION: Low lung volumes otherwise stable left pleural effusion and likely trace right effusion. No evidence of acute  cardiopulmonary disease.   Electronically Signed   By: Margaree Mackintosh M.D.   On: 07/07/2013 10:07   Ir Perc Pleural Drain W/[place]well Cath W/img Guide  07/07/2013   CLINICAL DATA:  SHORTNESS OF BREATH, RECURRENT LEFT PLEURAL EFFUSION  EXAM: ULTRASOUND AND FLUOROSCOPIC 10 FRENCH LEFT CHEST TUBE INSERTION  Date:  3/2/20153/06/2013 2:30 PM  Radiologist:  Jerilynn Mages. Daryll Brod, MD  Guidance:  Ultrasound and fluoroscopic  MEDICATIONS AND MEDICAL HISTORY: 2 mg Versed and, 100 mcg fentanyl  ANESTHESIA/SEDATION: 15 min  CONTRAST:  None.  FLUOROSCOPY TIME:  6 seconds  PROCEDURE: Informed consent was obtained from the patient following explanation of the procedure, risks, benefits and alternatives. The patient understands, agrees and consents for the procedure. All questions were addressed. A time out was performed.  Maximal barrier sterile technique utilized including caps, mask, sterile gowns, sterile gloves, large sterile drape, hand hygiene, and lower breath.  Previous imaging reviewed. Patient position the right anterior oblique. Preliminary ultrasound performed. The large left effusion was localized. Intercostal space was marked. Under sterile conditions and local anesthesia, ultrasound percutaneous needle access was performed of the left chest. Needle position confirmed within the pleural fluid. Guidewire inserted followed by tract dilatation to insert a 10 [place] drain. Drain catheter position confirmed with ultrasound and fluoroscopy. Images obtained for documentation. Catheter secured with a Prolene suture and connected to a Pleur-Evac. Drain secured with a Prolene suture. Sterile dressing applied. No immediate complication. Patient tolerated the procedure  well.  COMPLICATIONS: No immediate  IMPRESSION: Successful ultrasound and fluoroscopic 10 French left chest tube insertion for recurrent large effusion.  PLAN: Chest tube to low wall suction through Pleur-Evac at 20 cm water   Electronically Signed   By: Daryll Brod  M.D.   On: 07/07/2013 14:39    Anti-infectives: Anti-infectives   Start     Dose/Rate Route Frequency Ordered Stop   07/07/13 1311  ceFAZolin (ANCEF) 2-3 GM-% IVPB SOLR    Comments:  Levin Erp   : cabinet override      07/07/13 1311 07/07/13 1315   07/07/13 1300  ceFAZolin (ANCEF) IVPB 2 g/50 mL premix    Comments:  Will be given in and by IR RN   2 g 100 mL/hr over 30 Minutes Intravenous  Once 07/07/13 1221 07/07/13 1343      Assessment/Plan: s/p left chest tube 3/2 for recurrent effusion; check CXR today; cont current tx; once output diminshes and remains minimal over 2-3 day period consider removing tube  LOS: 1 day    Jeweliana Dudgeon,D [facility] 07/08/2013

## 2013-07-09 ENCOUNTER — Encounter: Payer: Medicare Other | Admitting: Internal Medicine

## 2013-07-09 ENCOUNTER — Encounter (HOSPITAL_COMMUNITY): Payer: Self-pay | Admitting: Radiology

## 2013-07-09 ENCOUNTER — Inpatient Hospital Stay (HOSPITAL_COMMUNITY): Payer: Medicare Other

## 2013-07-09 DIAGNOSIS — Z950 Presence of cardiac pacemaker: Secondary | ICD-10-CM | POA: Diagnosis not present

## 2013-07-09 DIAGNOSIS — J9600 Acute respiratory failure, unspecified whether with hypoxia or hypercapnia: Secondary | ICD-10-CM | POA: Diagnosis not present

## 2013-07-09 DIAGNOSIS — J90 Pleural effusion, not elsewhere classified: Secondary | ICD-10-CM | POA: Diagnosis not present

## 2013-07-09 DIAGNOSIS — D509 Iron deficiency anemia, unspecified: Secondary | ICD-10-CM | POA: Diagnosis not present

## 2013-07-09 DIAGNOSIS — I2510 Atherosclerotic heart disease of native coronary artery without angina pectoris: Secondary | ICD-10-CM | POA: Diagnosis not present

## 2013-07-09 MED ORDER — METOPROLOL TARTRATE 25 MG PO TABS
25.0000 mg | ORAL_TABLET | Freq: Two times a day (BID) | ORAL | Status: DC
  Administered 2013-07-09 – 2013-07-11 (×4): 25 mg via ORAL
  Filled 2013-07-09 (×4): qty 1

## 2013-07-09 MED ORDER — FUROSEMIDE 20 MG PO TABS
20.0000 mg | ORAL_TABLET | Freq: Every day | ORAL | Status: DC
  Administered 2013-07-09 – 2013-07-11 (×3): 20 mg via ORAL
  Filled 2013-07-09 (×3): qty 1

## 2013-07-09 NOTE — Progress Notes (Signed)
Late entry: Subjective:  Feeling better. Had over 1.5 litre of fluid drained. Afebrile.  Objective:  Vital Signs in the last 24 hours: Temp:  [98 F (36.7 C)-98.1 F (36.7 C)] 98 F (36.7 C)  Pulse Rate:  [80-86] 80  Cardiac Rhythm:  [-] Ventricular paced  Resp:  [18] 18  BP: (130-159)/(71-74) 159/74 mmHg  SpO2:  [93 %-94 %] 93 %   Physical Exam: BP Readings from Last 1 Encounters:  07/09/13 159/74     Wt Readings from Last 1 Encounters:  07/07/13 66.4 kg (146 lb 6.2 oz)    Weight change:   HEENT: [place]/AT, Eyes-Hazel, PERL, EOMI, Conjunctiva-Pale pink, Sclera-Non-icteric Neck: No JVD, No bruit, Trachea midline. Lungs:  Clearing, Bilateral. Cardiac:  Regular rhythm, normal S1 and S2, no S3. II/VI systolic murmur. Abdomen:  Soft, non-tender. Extremities:  No edema present. No cyanosis. No clubbing. CNS: AxOx3, Cranial nerves grossly intact, moves all 4 extremities. Right handed. Skin: Warm and dry.   Intake/Output from previous day: 03/03 0701 - 03/04 0700 In: 841 [P.O.:840; I.V.:3] Out: 1520 [Urine:1500; Chest Tube:20]    Lab Results: BMET    Component Value Date/Time   NA 133* 07/08/2013 0332   K 5.1 07/08/2013 0332   CL 96 07/08/2013 0332   CO2 23 07/08/2013 0332   GLUCOSE 95 07/08/2013 0332   BUN 26* 07/08/2013 0332   CREATININE 1.03 07/08/2013 0332   CALCIUM 8.6 07/08/2013 0332   GFRNONAA 49* 07/08/2013 0332   GFRAA 57* 07/08/2013 0332   CBC    Component Value Date/Time   WBC 14.5* 07/07/2013 0913   RBC 3.39* 07/07/2013 0913   HGB 10.1* 07/07/2013 0913   HCT 29.6* 07/07/2013 0913   PLT 380 07/07/2013 0913   MCV 87.3 07/07/2013 0913   MCH 29.8 07/07/2013 0913   MCHC 34.1 07/07/2013 0913   RDW 15.4 07/07/2013 0913   LYMPHSABS 0.8 07/07/2013 0913   MONOABS 1.2* 07/07/2013 0913   EOSABS 0.1 07/07/2013 0913   BASOSABS 0.0 07/07/2013 0913   CARDIAC ENZYMES Lab Results  Component Value Date   TROPONINI <0.30 06/07/2013    Scheduled Meds: . colchicine  0.6 mg Oral Daily  . digoxin  0.125 mg  Oral Daily  . diltiazem  180 mg Oral Daily  . furosemide  20 mg Oral Daily  . heparin  5,000 Units Subcutaneous 3 times per day  . metoprolol  25 mg Oral BID  . multivitamin with minerals  1 tablet Oral Daily  . pantoprazole  40 mg Oral Daily  . sodium chloride  3 mL Intravenous Q12H  . sodium chloride  3 mL Intravenous Q12H   Continuous Infusions:  PRN Meds:.sodium chloride, ALPRAZolam, HYDROcodone-acetaminophen, sodium chloride  Assessment/Plan: Dyspnea and weakness  H/O Complete heart block  S/P dual chamber pacemaker placement with revision  S/P Pericardiocentesis  Hypertension  Mild native vessel coronary artery disease  Anemia of blood loss  Anxiety  Weakness  Paroxysmal atrial flutter/fibrillation  Moderate Left pleural effusion  S/P Thoracentesis  R/O Left lung pneumonia  R/O left lung atelectasis  Continue current treatment   LOS: 1 day    Dixie Dials  MD  07/09/2013, 7:46 PM

## 2013-07-09 NOTE — Progress Notes (Addendum)
Subjective: Pt denies SOB Main c/o is pain with deep inspiration on left side  Objective: Vital signs in last 24 hours: Temp:  [97.3 F (36.3 C)-98.1 F (36.7 C)] 98 F (36.7 C) (03/04 8416) Pulse Rate:  [80-95] 80 (03/04 0633) Resp:  [18] 18 (03/04 0633) BP: (90-159)/(58-74) 159/74 mmHg (03/04 0633) SpO2:  [93 %-100 %] 93 % (03/04 0633) Last BM Date: 07/07/13  Intake/Output from previous day: 03/03 0701 - 03/04 0700 In: 606 [P.O.:840; I.V.:3] Out: 1520 [Urine:1500; Chest Tube:20] Intake/Output this shift: Total I/O In: 100 [P.O.:100] Out: 35 [Chest Tube:35]  Left chest drain intact,  Initial output 1.8L Since then, only 25-62mL per day  Lab Results:   Recent Labs  07/07/13 0913  WBC 14.5*  HGB 10.1*  HCT 29.6*  PLT 380   BMET  Recent Labs  07/07/13 0913 07/08/13 0332  NA 130* 133*  K 4.5 5.1  CL 94* 96  CO2 20 23  GLUCOSE 115* 95  BUN 27* 26*  CREATININE 0.81 1.03  CALCIUM 8.5 8.6   PT/INR No results found for this basename: LABPROT, INR,  in the last 72 hours ABG No results found for this basename: PHART, PCO2, PO2, HCO3,  in the last 72 hours  Studies/Results: X-ray Chest Pa And Lateral   07/07/2013   CLINICAL DATA:  Shortness of breath  EXAM: CHEST  2 VIEW  COMPARISON:  DG CHEST 2 VIEW dated 07/04/2013  FINDINGS: Low lung volumes. A left chest wall dual chamber cardiac pacing unit is appreciated lead tips projecting in the region the right atrium and right ventricle. The cardiac silhouette is mild-to-moderately enlarged. A stable left pleural effusion is appreciated. There is blunting of the right costophrenic angle. No new focal regions of consolidation no new focal infiltrates. The osseous structures are unremarkable.  IMPRESSION: Low lung volumes otherwise stable left pleural effusion and likely trace right effusion. No evidence of acute cardiopulmonary disease.   Electronically Signed   By: Margaree Mackintosh M.D.   On: 07/07/2013 10:07   Dg Chest  Port 1 View  07/08/2013   CLINICAL DATA:  Reassess left-sided chest tube  EXAM: PORTABLE CHEST - 1 VIEW  COMPARISON:  IR PERC PLEURAL DRAIN W/INDWELL CATH W/IMG GUIDE dated 07/07/2013; DG CHEST 2 VIEW dated 07/07/2013  FINDINGS: The pleural effusion on the left has decreased in volume since placement of the small caliber left chest tube in the lower pleural space. A moderate amount of fluid likely remains, but atelectasis is most likely contributing to the density is well. The left upper lobe is clear. The right lung is clear. The cardiac silhouette is top-normal in size. The pulmonary vascularity is not engorged. A permanent pacemaker is in place. The observed portions of the bony thorax exhibit no acute abnormalities.  IMPRESSION: There has been interval decrease in the density in the left lung base can assistant with drainage of some of the moderate-sized pleural effusions. There does remain pleural fluid and/or basilar atelectasis on the left. There is no evidence of a pneumothorax.   Electronically Signed   By: David  Martinique   On: 07/08/2013 10:33   Ir Perc Pleural Drain Genia Harold Cath W/img Guide  07/07/2013   CLINICAL DATA:  SHORTNESS OF BREATH, RECURRENT LEFT PLEURAL EFFUSION  EXAM: ULTRASOUND AND FLUOROSCOPIC 10 FRENCH LEFT CHEST TUBE INSERTION  Date:  3/2/20153/06/2013 2:30 PM  Radiologist:  Jerilynn Mages. Daryll Brod, MD  Guidance:  Ultrasound and fluoroscopic  MEDICATIONS AND MEDICAL HISTORY: 2 mg Versed and,  100 mcg fentanyl  ANESTHESIA/SEDATION: 15 min  CONTRAST:  None.  FLUOROSCOPY TIME:  6 seconds  PROCEDURE: Informed consent was obtained from the patient following explanation of the procedure, risks, benefits and alternatives. The patient understands, agrees and consents for the procedure. All questions were addressed. A time out was performed.  Maximal barrier sterile technique utilized including caps, mask, sterile gowns, sterile gloves, large sterile drape, hand hygiene, and lower breath.  Previous imaging  reviewed. Patient position the right anterior oblique. Preliminary ultrasound performed. The large left effusion was localized. Intercostal space was marked. Under sterile conditions and local anesthesia, ultrasound percutaneous needle access was performed of the left chest. Needle position confirmed within the pleural fluid. Guidewire inserted followed by tract dilatation to insert a 10 [place] drain. Drain catheter position confirmed with ultrasound and fluoroscopy. Images obtained for documentation. Catheter secured with a Prolene suture and connected to a Pleur-Evac. Drain secured with a Prolene suture. Sterile dressing applied. No immediate complication. Patient tolerated the procedure well.  COMPLICATIONS: No immediate  IMPRESSION: Successful ultrasound and fluoroscopic 10 French left chest tube insertion for recurrent large effusion.  PLAN: Chest tube to low wall suction through Pleur-Evac at 20 cm water   Electronically Signed   By: Daryll Brod M.D.   On: 07/07/2013 14:39    Anti-infectives: Anti-infectives   Start     Dose/Rate Route Frequency Ordered Stop   07/07/13 1311  ceFAZolin (ANCEF) 2-3 GM-% IVPB SOLR    Comments:  Levin Erp   : cabinet override      07/07/13 1311 07/07/13 1315   07/07/13 1300  ceFAZolin (ANCEF) IVPB 2 g/50 mL premix    Comments:  Will be given in and by IR RN   2 g 100 mL/hr over 30 Minutes Intravenous  Once 07/07/13 1221 07/07/13 1343      Assessment/Plan: s/p left chest tube 3/2 for recurrent effusion No CXR today If output remains low (<25cc/day)for another 1-2 days, consider removal of drain.  LOS: 2 days    Ascencion Dike 07/09/2013   Addendum:  CXR shows interval increase in left pleural effusion despite chest tube.  Possibly kinked, partially retracted, or obstructed. There is fibrinous tissue in the tubing when re-evaluated this morning. D/W Dr. Doylene Canard that she will need exchange and likely upsize of her chest tube. She has eaten today  and would prefer to have this with sedation again. I discussed the procedure with her and her family. She has signed consent. NPO p MN  Ascencion Dike PA-C Interventional Radiology 07/09/2013 2:57 PM

## 2013-07-09 NOTE — Progress Notes (Signed)
Subjective:  Left sided chest pain with deep breath. Afebrile.  Objective:  Vital Signs in the last 24 hours: Temp:  [98 F (36.7 C)-98.1 F (36.7 C)] 98 F (36.7 C) (03/04 0981) Pulse Rate:  [80-86] 80 (03/04 0633) Cardiac Rhythm:  [-] Ventricular paced (03/04 0742) Resp:  [18] 18 (03/04 0633) BP: (130-159)/(71-74) 159/74 mmHg (03/04 0633) SpO2:  [93 %-94 %] 93 % (03/04 1914)  Physical Exam: BP Readings from Last 1 Encounters:  07/09/13 159/74     Wt Readings from Last 1 Encounters:  07/07/13 66.4 kg (146 lb 6.2 oz)    Weight change:   HEENT: [place]/AT, Eyes-Hazel, Conjunctiva-Pale pink, Sclera-Non-icteric Neck: No JVD, No bruit, Trachea midline. Lungs:  Clearing, Bilateral. Cardiac:  Regular rhythm, normal S1 and S2, no S3. II/VI systolic murmur  Abdomen:  Soft, non-tender. Extremities:  No edema present. No cyanosis. No clubbing. CNS: AxOx3, Cranial nerves grossly intact, moves all 4 extremities. Right handed. Skin: Warm and dry.   Intake/Output from previous day: 03/03 0701 - 03/04 0700 In: 782 [P.O.:840; I.V.:3] Out: 1520 [Urine:1500; Chest Tube:20]    Lab Results: BMET    Component Value Date/Time   NA 133* 07/08/2013 0332   K 5.1 07/08/2013 0332   CL 96 07/08/2013 0332   CO2 23 07/08/2013 0332   GLUCOSE 95 07/08/2013 0332   BUN 26* 07/08/2013 0332   CREATININE 1.03 07/08/2013 0332   CALCIUM 8.6 07/08/2013 0332   GFRNONAA 49* 07/08/2013 0332   GFRAA 57* 07/08/2013 0332   CBC    Component Value Date/Time   WBC 14.5* 07/07/2013 0913   RBC 3.39* 07/07/2013 0913   HGB 10.1* 07/07/2013 0913   HCT 29.6* 07/07/2013 0913   PLT 380 07/07/2013 0913   MCV 87.3 07/07/2013 0913   MCH 29.8 07/07/2013 0913   MCHC 34.1 07/07/2013 0913   RDW 15.4 07/07/2013 0913   LYMPHSABS 0.8 07/07/2013 0913   MONOABS 1.2* 07/07/2013 0913   EOSABS 0.1 07/07/2013 0913   BASOSABS 0.0 07/07/2013 0913   CARDIAC ENZYMES Lab Results  Component Value Date   TROPONINI <0.30 06/07/2013    Scheduled Meds: . colchicine  0.6  mg Oral Daily  . digoxin  0.125 mg Oral Daily  . diltiazem  180 mg Oral Daily  . furosemide  20 mg Oral Daily  . heparin  5,000 Units Subcutaneous 3 times per day  . metoprolol  25 mg Oral BID  . multivitamin with minerals  1 tablet Oral Daily  . pantoprazole  40 mg Oral Daily  . sodium chloride  3 mL Intravenous Q12H  . sodium chloride  3 mL Intravenous Q12H   Continuous Infusions:  PRN Meds:.sodium chloride, ALPRAZolam, HYDROcodone-acetaminophen, sodium chloride  Assessment/Plan: Dyspnea and weakness  H/O Complete heart block  S/P dual chamber pacemaker placement with revision  S/P Pericardiocentesis  Hypertension  Mild native vessel coronary artery disease  Anemia of blood loss  Anxiety  Weakness  Paroxysmal atrial flutter/fibrillation  Moderate Left pleural effusion  S/P Thoracentesis  R/O Left lung pneumonia  R/O left lung atelectasis  Recheck Chest X-ray.    LOS: 2 days    Dixie Dials  MD  07/09/2013, 7:50 PM

## 2013-07-10 ENCOUNTER — Inpatient Hospital Stay (HOSPITAL_COMMUNITY): Payer: Medicare Other

## 2013-07-10 DIAGNOSIS — I2510 Atherosclerotic heart disease of native coronary artery without angina pectoris: Secondary | ICD-10-CM | POA: Diagnosis not present

## 2013-07-10 DIAGNOSIS — D509 Iron deficiency anemia, unspecified: Secondary | ICD-10-CM | POA: Diagnosis not present

## 2013-07-10 DIAGNOSIS — Z950 Presence of cardiac pacemaker: Secondary | ICD-10-CM | POA: Diagnosis not present

## 2013-07-10 DIAGNOSIS — J9600 Acute respiratory failure, unspecified whether with hypoxia or hypercapnia: Secondary | ICD-10-CM | POA: Diagnosis not present

## 2013-07-10 DIAGNOSIS — T85698A Other mechanical complication of other specified internal prosthetic devices, implants and grafts, initial encounter: Secondary | ICD-10-CM | POA: Diagnosis not present

## 2013-07-10 LAB — CBC
HCT: 24.1 % — ABNORMAL LOW (ref 36.0–46.0)
Hemoglobin: 8.2 g/dL — ABNORMAL LOW (ref 12.0–15.0)
MCH: 29.5 pg (ref 26.0–34.0)
MCHC: 34 g/dL (ref 30.0–36.0)
MCV: 86.7 fL (ref 78.0–100.0)
Platelets: 332 10*3/uL (ref 150–400)
RBC: 2.78 MIL/uL — ABNORMAL LOW (ref 3.87–5.11)
RDW: 15.4 % (ref 11.5–15.5)
WBC: 6.7 10*3/uL (ref 4.0–10.5)

## 2013-07-10 LAB — BASIC METABOLIC PANEL
BUN: 24 mg/dL — ABNORMAL HIGH (ref 6–23)
CO2: 23 mEq/L (ref 19–32)
Calcium: 8.1 mg/dL — ABNORMAL LOW (ref 8.4–10.5)
Chloride: 98 mEq/L (ref 96–112)
Creatinine, Ser: 1.09 mg/dL (ref 0.50–1.10)
GFR calc Af Amer: 53 mL/min — ABNORMAL LOW (ref >90)
GFR calc non Af Amer: 46 mL/min — ABNORMAL LOW (ref >90)
Glucose, Bld: 89 mg/dL (ref 70–99)
Potassium: 4.8 mEq/L (ref 3.7–5.3)
Sodium: 132 mEq/L — ABNORMAL LOW (ref 137–147)

## 2013-07-10 MED ORDER — FENTANYL CITRATE 0.05 MG/ML IJ SOLN
INTRAMUSCULAR | Status: AC
  Filled 2013-07-10: qty 2

## 2013-07-10 MED ORDER — FENTANYL CITRATE 0.05 MG/ML IJ SOLN
INTRAMUSCULAR | Status: AC | PRN
  Administered 2013-07-10 (×2): 50 ug via INTRAVENOUS

## 2013-07-10 MED ORDER — MIDAZOLAM HCL 2 MG/2ML IJ SOLN
INTRAMUSCULAR | Status: AC | PRN
  Administered 2013-07-10 (×2): 1 mg via INTRAVENOUS

## 2013-07-10 MED ORDER — MIDAZOLAM HCL 2 MG/2ML IJ SOLN
INTRAMUSCULAR | Status: AC
  Filled 2013-07-10: qty 2

## 2013-07-10 NOTE — Progress Notes (Signed)
Subjective:  Awaiting repeat thoracentesis with new tube. Some anxiety. Afebrile  Objective:  Vital Signs in the last 24 hours: Temp:  [98 F (36.7 C)-98.6 F (37 C)] 98.6 F (37 C) (03/05 0458) Pulse Rate:  [70-83] 70 (03/05 0458) Cardiac Rhythm:  [-] Ventricular paced (03/04 2300) Resp:  [18-20] 18 (03/05 0458) BP: (124-150)/(68-70) 149/68 mmHg (03/05 0458) SpO2:  [93 %-95 %] 93 % (03/05 0458)  Physical Exam: BP Readings from Last 1 Encounters:  07/10/13 149/68     Wt Readings from Last 1 Encounters:  07/07/13 66.4 kg (146 lb 6.2 oz)    Weight change:   HEENT: [place]/AT, Eyes-Hazel, PERL, EOMI, Conjunctiva-Pale, Sclera-Non-icteric Neck: No JVD, No bruit, Trachea midline. Lungs:  Left basal crackles. Cardiac:  Regular rhythm, normal S1 and S2, no S3. II/VI systolic murmur. Abdomen:  Soft, non-tender. Extremities:  No edema present. No cyanosis. No clubbing. CNS: AxOx3, Cranial nerves grossly intact, moves all 4 extremities. Right handed. Skin: Warm and dry.   Intake/Output from previous day: 03/04 0701 - 03/05 0700 In: 430 [P.O.:430] Out: 1023 [Urine:975; Chest Tube:48]    Lab Results: BMET    Component Value Date/Time   NA 132* 07/10/2013 0300   K 4.8 07/10/2013 0300   CL 98 07/10/2013 0300   CO2 23 07/10/2013 0300   GLUCOSE 89 07/10/2013 0300   BUN 24* 07/10/2013 0300   CREATININE 1.09 07/10/2013 0300   CALCIUM 8.1* 07/10/2013 0300   GFRNONAA 46* 07/10/2013 0300   GFRAA 53* 07/10/2013 0300   CBC    Component Value Date/Time   WBC 6.7 07/10/2013 0300   RBC 2.78* 07/10/2013 0300   HGB 8.2* 07/10/2013 0300   HCT 24.1* 07/10/2013 0300   PLT 332 07/10/2013 0300   MCV 86.7 07/10/2013 0300   MCH 29.5 07/10/2013 0300   MCHC 34.0 07/10/2013 0300   RDW 15.4 07/10/2013 0300   LYMPHSABS 0.8 07/07/2013 0913   MONOABS 1.2* 07/07/2013 0913   EOSABS 0.1 07/07/2013 0913   BASOSABS 0.0 07/07/2013 0913   CARDIAC ENZYMES Lab Results  Component Value Date   TROPONINI <0.30 06/07/2013    Scheduled Meds: .  colchicine  0.6 mg Oral Daily  . digoxin  0.125 mg Oral Daily  . diltiazem  180 mg Oral Daily  . furosemide  20 mg Oral Daily  . heparin  5,000 Units Subcutaneous 3 times per day  . metoprolol  25 mg Oral BID  . multivitamin with minerals  1 tablet Oral Daily  . pantoprazole  40 mg Oral Daily  . sodium chloride  3 mL Intravenous Q12H  . sodium chloride  3 mL Intravenous Q12H   Continuous Infusions:  PRN Meds:.sodium chloride, ALPRAZolam, HYDROcodone-acetaminophen, sodium chloride  Assessment/Plan: Dyspnea and weakness  H/O Complete heart block  S/P dual chamber pacemaker placement with revision  S/P Pericardiocentesis  Hypertension  Mild native vessel coronary artery disease  Anemia of blood loss  Anxiety  Weakness  Paroxysmal atrial flutter/fibrillation  Moderate Left pleural effusion  S/P Thoracentesis  R/O Left lung pneumonia  R/O left lung atelectasis  Awaiting repeat thoracentesis.   LOS: 3 days    Dixie Dials  MD  07/10/2013, 9:06 AM

## 2013-07-10 NOTE — Sedation Documentation (Signed)
Patient denies pain and is resting comfortably.  

## 2013-07-10 NOTE — Procedures (Signed)
Exchg and reposition of 67fr left chest drain 250cc pleural fld removed No comp Stable Keep to [facility] AT 20CM H2O FULL REPORT IN PACS

## 2013-07-11 ENCOUNTER — Inpatient Hospital Stay (HOSPITAL_COMMUNITY): Payer: Medicare Other

## 2013-07-11 DIAGNOSIS — D509 Iron deficiency anemia, unspecified: Secondary | ICD-10-CM | POA: Diagnosis not present

## 2013-07-11 DIAGNOSIS — J90 Pleural effusion, not elsewhere classified: Secondary | ICD-10-CM | POA: Diagnosis not present

## 2013-07-11 DIAGNOSIS — I2510 Atherosclerotic heart disease of native coronary artery without angina pectoris: Secondary | ICD-10-CM | POA: Diagnosis not present

## 2013-07-11 DIAGNOSIS — Z950 Presence of cardiac pacemaker: Secondary | ICD-10-CM | POA: Diagnosis not present

## 2013-07-11 DIAGNOSIS — J9600 Acute respiratory failure, unspecified whether with hypoxia or hypercapnia: Secondary | ICD-10-CM | POA: Diagnosis not present

## 2013-07-11 LAB — BASIC METABOLIC PANEL
BUN: 18 mg/dL (ref 6–23)
CO2: 23 mEq/L (ref 19–32)
Calcium: 8.4 mg/dL (ref 8.4–10.5)
Chloride: 94 mEq/L — ABNORMAL LOW (ref 96–112)
Creatinine, Ser: 0.97 mg/dL (ref 0.50–1.10)
GFR calc Af Amer: 61 mL/min — ABNORMAL LOW (ref >90)
GFR calc non Af Amer: 53 mL/min — ABNORMAL LOW (ref >90)
Glucose, Bld: 88 mg/dL (ref 70–99)
Potassium: 4.6 mEq/L (ref 3.7–5.3)
Sodium: 131 mEq/L — ABNORMAL LOW (ref 137–147)

## 2013-07-11 LAB — CBC
HCT: 27.8 % — ABNORMAL LOW (ref 36.0–46.0)
Hemoglobin: 9.6 g/dL — ABNORMAL LOW (ref 12.0–15.0)
MCH: 30 pg (ref 26.0–34.0)
MCHC: 34.5 g/dL (ref 30.0–36.0)
MCV: 86.9 fL (ref 78.0–100.0)
Platelets: 323 10*3/uL (ref 150–400)
RBC: 3.2 MIL/uL — ABNORMAL LOW (ref 3.87–5.11)
RDW: 15.6 % — ABNORMAL HIGH (ref 11.5–15.5)
WBC: 6.8 10*3/uL (ref 4.0–10.5)

## 2013-07-11 NOTE — Care Management Note (Unsigned)
    Page 1 of 1   07/11/2013     4:15:25 PM   CARE MANAGEMENT NOTE 07/11/2013  Patient:  KERRILYN, AZBILL   Account Number:  [id number]  Date Initiated:  07/11/2013  Documentation initiated by:  Ivin Rosenbloom  Subjective/Objective Assessment:   PT ADM ON 07/07/13 WITH PLEURAL EFFUSION REQUIRING CHEST TUBE PLACEMENT.  PTA, PT RESIDES WITH DAUGHTER AND IS ACTIVE WITH [facility] FOR HH NEEDS.  SHE IS ON CHRONIC HOME OXYGEN.     Action/Plan:   WILL FOLLOW FOR DC NEEDS.  WILL NEED RESUMPTION OF HH CARE ORDERS PRIOR TO DC.   Anticipated DC Date:  07/14/2013   Anticipated DC Plan:  [place]  CM consult      Choice offered to / List presented to:             Status of service:  In process, will continue to follow Medicare Important Message given?   (If response is "NO", the following Medicare IM given date fields will be blank) Date Medicare IM given:   Date Additional Medicare IM given:    Discharge Disposition:    Per UR Regulation:  Reviewed for med. necessity/level of care/duration of stay  If discussed at [place] of Stay Meetings, dates discussed:    Comments:

## 2013-07-11 NOTE — Progress Notes (Signed)
Patient ID: Theresa Norton, female   DOB: 1928/09/17, 80 y.o.   MRN: [id number]  CXR shows no further residual pleural fluid.  Pigtail left chest tube removed at bedside.  Vaseline gauze dressing applied.

## 2013-07-11 NOTE — Discharge Summary (Signed)
Physician Discharge Summary  Patient ID: Theresa Norton MRN: [id number] DOB/AGE: 09-07-1928 80 y.o.  Admit date: 07/07/2013 Discharge date: 07/11/2013  Admission Diagnoses: Dyspnea and weakness  H/O Complete heart block  S/P dual chamber pacemaker placement with revision  S/P Pericardiocentesis  Hypertension  Mild native vessel coronary artery disease  Anemia of blood loss  Anxiety  Weakness  Paroxysmal atrial flutter/fibrillation  Moderate Left pleural effusion  S/P Thoracentesis  R/O Left lung pneumonia  R/O left lung atelectasis  Discharge Diagnoses:  Principle diagnosis: Acute respiratory failure Active Problems: Pleural effusion- drained Dyspnea and weakness  H/O Complete heart block  S/P dual chamber pacemaker placement with revision  S/P Pericardiocentesis  Hypertension  Mild native vessel coronary artery disease  Anemia of blood loss  Anxiety  Weakness  Paroxysmal atrial flutter/fibrillation  Moderate Left pleural effusion  S/P Thoracentesis  R/O Left lung pneumonia  R/O left lung atelectasis   Discharged Condition: fair  Hospital Course: 80 year old female with recent pacemaker revision (06/14/2013 by Dr. Rayann Heman) had pericardial fluid drained on 06/11/2013. Then she had large pleural effusion drained on 06/16/2013 by Dr. Titus Mould. Readmitted with moderate to large pleural fluid re-accumulation along with weakness and shortness of breath. She had over 1.5 Litre fluid removed with left thoracentesis. Her chest tube was accidentally half way pulled out requiring another slightly larger drainage tube placed yesterday. Repeat chest x-ray today showed no further residual pleural fluid per radiology. Her chest tube was removed and she was discharged home in stable condition.  Consults: cardiology and interventional radiology.  Significant Diagnostic Studies: labs: Near normal BMET except low sodium of 131-133. Normal WBC count with low Hgb of 8.2 to 10.1.  Chest  x-ray-Large left pleural effusion on admission to near resolution of pleural effusion on todays chest x-ray.  Treatments: cardiac meds: metoprolol, diltiazem, digoxin and furosemide and procedures: thoracentesis.  Discharge Exam: Blood pressure 129/74, pulse 89, temperature 97.8 F (36.6 C), temperature source Oral, resp. rate 20, height 5\' 3"  (1.6 m), weight 66.4 kg (146 lb 6.2 oz), SpO2 97.00%.  HEENT: [place]/AT, Eyes-Hazel, PERL, EOMI, Conjunctiva-Pale pink, Sclera-Non-icteric  Neck: No JVD, No bruit, Trachea midline.  Lungs: Clearing, Bilateral.  Cardiac: Regular rhythm, normal S1 and S2, no S3. II/VI systolic murmur.  Abdomen: Soft, non-tender.  Extremities: No edema present. No cyanosis. No clubbing.  CNS: AxOx3, Cranial nerves grossly intact, moves all 4 extremities. Right handed.  Skin: Warm and dry.  Disposition: 06-Home-Health Care Svc   Future Appointments Provider Department Dept Phone   07/24/2013 10:30 AM Eulas Post, MD [facility] [phone]   09/04/2013 11:00 AM Thompson Grayer, MD [facility] [phone]       Medication List    STOP taking these medications       amLODipine 5 MG tablet  Commonly known as:  NORVASC      TAKE these medications       acetaminophen 325 MG tablet  Commonly known as:  TYLENOL  Take 325 mg by mouth every 6 (six) hours as needed for mild pain.     ALPRAZolam 0.25 MG tablet  Commonly known as:  XANAX  Take 1 tablet (0.25 mg total) by mouth 2 (two) times daily as needed for anxiety.     colchicine 0.6 MG tablet  Take 1 tablet (0.6 mg total) by mouth daily.     digoxin 0.125 MG tablet  Commonly known as:  LANOXIN  Take 1 tablet (0.125 mg  total) by mouth daily.     diltiazem 180 MG 24 hr capsule  Commonly known as:  CARDIZEM CD  Take 1 capsule (180 mg total) by mouth daily.     esomeprazole 40 MG capsule  Commonly known as:  NEXIUM  Take 40 mg by mouth daily at 12 noon.      furosemide 20 MG tablet  Commonly known as:  LASIX  Take 1 tablet (20 mg total) by mouth every Monday, Wednesday, and Friday.     IRON PO  Take 1 tablet by mouth daily.     losartan 100 MG tablet  Commonly known as:  COZAAR  Take 1 tablet (100 mg total) by mouth daily.     megestrol 40 MG/ML suspension  Commonly known as:  MEGACE  Take 200 mg by mouth daily.     metoprolol 50 MG tablet  Commonly known as:  LOPRESSOR  Take 0.5 tablets (25 mg total) by mouth 2 (two) times daily.     potassium chloride 10 MEQ tablet  Commonly known as:  K-DUR  Take 1 tablet (10 mEq total) by mouth daily.     promethazine 25 MG tablet  Commonly known as:  PHENERGAN  Take 12.5 mg by mouth daily as needed for nausea or vomiting.           Follow-up Information   Follow up with Eulas Post, MD. Schedule an appointment as soon as possible for a visit in 2 weeks.   Specialty:  Family Medicine   Contact information:   [place] [address] [phone]       Follow up with [facility], MD. Schedule an appointment as soon as possible for a visit in 1 week.   Specialty:  Cardiology   Contact information:   [place] [address] [phone]       Signed: Birdie Riddle 07/11/2013, 6:45 PM

## 2013-07-11 NOTE — Progress Notes (Signed)
Subjective: Pt feels ok this morning. No SOB Still having some pains on (L)side with cough or deep inspiration   Objective: Physical Exam: BP 134/77  Pulse 71  Temp(Src) 98.5 F (36.9 C) (Oral)  Resp 18  Ht 5\' 3"  (1.6 m)  Wt 146 lb 6.2 oz (66.4 kg)  BMI 25.94 kg/m2  SpO2 91% (L)chest tube intact, site clean, no leakage. Mildly tender at insertion site Output serous, no debris in line today Total of 410mL out since new tube placed yesterday. No CXR yet today    Labs: CBC  Recent Labs  07/10/13 0300 07/11/13 0424  WBC 6.7 6.8  HGB 8.2* 9.6*  HCT 24.1* 27.8*  PLT 332 323   BMET  Recent Labs  07/10/13 0300 07/11/13 0424  NA 132* 131*  K 4.8 4.6  CL 98 94*  CO2 23 23  GLUCOSE 89 88  BUN 24* 18  CREATININE 1.09 0.97  CALCIUM 8.1* 8.4   LFT No results found for this basename: PROT, ALBUMIN, AST, ALT, ALKPHOS, BILITOT, BILIDIR, IBILI, LIPASE,  in the last 72 hours PT/INR No results found for this basename: LABPROT, INR,  in the last 72 hours   Studies/Results: Dg Chest 2 View  07/09/2013   CLINICAL DATA:  Follow-up pleural effusion  EXAM: CHEST  2 VIEW  COMPARISON:  07/08/2013  FINDINGS: Cardiomegaly again noted. Mild thoracic dextroscoliosis. Stable left pleural effusion with left basilar atelectasis or infiltrate. Small right pleural effusion with right basilar atelectasis. No pulmonary edema. Cardiac pacemaker unchanged in position. Osteopenia and mild degenerative changes thoracic spine.  IMPRESSION: Cardiomegaly. Mild thoracic dextroscoliosis. Stable left pleural effusion with left basilar atelectasis or infiltrate.   Electronically Signed   By: Lahoma Crocker M.D.   On: 07/09/2013 12:31   Ir Catheter Tube Change  07/10/2013   CLINICAL DATA:  RETRACTED NONFUNCTIONING  LEFT CHEST TUBE  EXAM: FLUOROSCOPIC CHANGE OF THE LEFT CHEST TUBE  Date:  3/5/20153/09/2013 12:57 PM  Radiologist:  Jerilynn Mages. Daryll Brod, MD  Guidance:  Fluoroscopic  MEDICATIONS AND MEDICAL HISTORY: 2 mg  Versed, 100 mcg fentanyl  ANESTHESIA/SEDATION: 15 min  CONTRAST:  None.  FLUOROSCOPY TIME:  36 seconds  PROCEDURE: Informed consent was obtained from the patient following explanation of the procedure, risks, benefits and alternatives. The patient understands, agrees and consents for the procedure. All questions were addressed. A time out was performed.  Maximal barrier sterile technique utilized including caps, mask, sterile gowns, sterile gloves, large sterile drape, hand hygiene, and ChloraPrep.  Under sterile conditions, the existing retracted nonfunctioning left 10 Fr chest drain was removed over an Amplatz guidewire. A new 10 French catheter was advanced over the guidewire and position in the left pleural space. Position confirmed with fluoroscopy. Images obtained for documentation. 250 cc clear pleural fluid removed. Catheter secured with a Prolene suture and a sterile dressing. Catheter reconnected to pleural vac. No immediate complication. Patient tolerated the procedure well.  COMPLICATIONS: No immediate  IMPRESSION: Successful fluoroscopic replacement and reposition of the left 10 French chest drain.   Electronically Signed   By: Daryll Brod M.D.   On: 07/10/2013 14:33    Assessment/Plan: (L)pleural effusion S/p exchange for new pigtail chest tube Cont Pleurevac suction CXR today IR cont to follow Other plans per Primary    LOS: 4 days    Ascencion Dike PA-C 07/11/2013 10:27 AM

## 2013-07-14 DIAGNOSIS — I442 Atrioventricular block, complete: Secondary | ICD-10-CM | POA: Diagnosis not present

## 2013-07-14 DIAGNOSIS — Z48812 Encounter for surgical aftercare following surgery on the circulatory system: Secondary | ICD-10-CM | POA: Diagnosis not present

## 2013-07-14 DIAGNOSIS — I10 Essential (primary) hypertension: Secondary | ICD-10-CM | POA: Diagnosis not present

## 2013-07-14 DIAGNOSIS — Z45018 Encounter for adjustment and management of other part of cardiac pacemaker: Secondary | ICD-10-CM | POA: Diagnosis not present

## 2013-07-14 DIAGNOSIS — Z7901 Long term (current) use of anticoagulants: Secondary | ICD-10-CM | POA: Diagnosis not present

## 2013-07-14 DIAGNOSIS — D509 Iron deficiency anemia, unspecified: Secondary | ICD-10-CM | POA: Diagnosis not present

## 2013-07-15 ENCOUNTER — Ambulatory Visit
Admission: RE | Admit: 2013-07-15 | Discharge: 2013-07-15 | Disposition: A | Discharge status: Home / Self Care | Payer: Medicare Other | Source: Ambulatory Visit | Attending: Cardiovascular Disease | Admitting: Cardiovascular Disease

## 2013-07-15 ENCOUNTER — Other Ambulatory Visit: Payer: Self-pay | Admitting: Cardiovascular Disease

## 2013-07-15 DIAGNOSIS — J90 Pleural effusion, not elsewhere classified: Secondary | ICD-10-CM

## 2013-07-15 DIAGNOSIS — D500 Iron deficiency anemia secondary to blood loss (chronic): Secondary | ICD-10-CM | POA: Diagnosis not present

## 2013-07-15 DIAGNOSIS — R059 Cough, unspecified: Secondary | ICD-10-CM

## 2013-07-15 DIAGNOSIS — R05 Cough: Secondary | ICD-10-CM

## 2013-07-15 DIAGNOSIS — I2510 Atherosclerotic heart disease of native coronary artery without angina pectoris: Secondary | ICD-10-CM | POA: Diagnosis not present

## 2013-07-15 DIAGNOSIS — Z950 Presence of cardiac pacemaker: Secondary | ICD-10-CM | POA: Diagnosis not present

## 2013-07-17 DIAGNOSIS — D509 Iron deficiency anemia, unspecified: Secondary | ICD-10-CM | POA: Diagnosis not present

## 2013-07-17 DIAGNOSIS — Z45018 Encounter for adjustment and management of other part of cardiac pacemaker: Secondary | ICD-10-CM | POA: Diagnosis not present

## 2013-07-17 DIAGNOSIS — I442 Atrioventricular block, complete: Secondary | ICD-10-CM | POA: Diagnosis not present

## 2013-07-17 DIAGNOSIS — Z7901 Long term (current) use of anticoagulants: Secondary | ICD-10-CM | POA: Diagnosis not present

## 2013-07-17 DIAGNOSIS — Z48812 Encounter for surgical aftercare following surgery on the circulatory system: Secondary | ICD-10-CM | POA: Diagnosis not present

## 2013-07-17 DIAGNOSIS — I10 Essential (primary) hypertension: Secondary | ICD-10-CM | POA: Diagnosis not present

## 2013-07-18 ENCOUNTER — Ambulatory Visit (INDEPENDENT_AMBULATORY_CARE_PROVIDER_SITE_OTHER): Payer: Medicare Other | Admitting: Family Medicine

## 2013-07-18 ENCOUNTER — Encounter: Payer: Self-pay | Admitting: Family Medicine

## 2013-07-18 VITALS — BP 126/68 | HR 97 | Wt 144.0 lb

## 2013-07-18 DIAGNOSIS — J90 Pleural effusion, not elsewhere classified: Secondary | ICD-10-CM | POA: Diagnosis not present

## 2013-07-18 DIAGNOSIS — E871 Hypo-osmolality and hyponatremia: Secondary | ICD-10-CM | POA: Diagnosis not present

## 2013-07-18 DIAGNOSIS — I10 Essential (primary) hypertension: Secondary | ICD-10-CM

## 2013-07-18 DIAGNOSIS — D509 Iron deficiency anemia, unspecified: Secondary | ICD-10-CM | POA: Diagnosis not present

## 2013-07-18 LAB — CBC WITH DIFFERENTIAL/PLATELET
Basophils Absolute: 0 10*3/uL (ref 0.0–0.1)
Basophils Relative: 0.5 % (ref 0.0–3.0)
Eosinophils Absolute: 0.4 10*3/uL (ref 0.0–0.7)
Eosinophils Relative: 4.9 % (ref 0.0–5.0)
HCT: 29.8 % — ABNORMAL LOW (ref 36.0–46.0)
Hemoglobin: 9.9 g/dL — ABNORMAL LOW (ref 12.0–15.0)
Lymphocytes Relative: 25.4 % (ref 12.0–46.0)
Lymphs Abs: 2.2 10*3/uL (ref 0.7–4.0)
MCHC: 33.1 g/dL (ref 30.0–36.0)
MCV: 88.3 fl (ref 78.0–100.0)
Monocytes Absolute: 0.8 10*3/uL (ref 0.1–1.0)
Monocytes Relative: 8.9 % (ref 3.0–12.0)
Neutro Abs: 5.2 10*3/uL (ref 1.4–7.7)
Neutrophils Relative %: 60.3 % (ref 43.0–77.0)
Platelets: 423 10*3/uL — ABNORMAL HIGH (ref 150.0–400.0)
RBC: 3.37 Mil/uL — ABNORMAL LOW (ref 3.87–5.11)
RDW: 17.6 % — ABNORMAL HIGH (ref 11.5–14.6)
WBC: 8.6 10*3/uL (ref 4.5–10.5)

## 2013-07-18 LAB — BASIC METABOLIC PANEL
BUN: 23 mg/dL (ref 6–23)
CO2: 24 mEq/L (ref 19–32)
Calcium: 9 mg/dL (ref 8.4–10.5)
Chloride: 105 mEq/L (ref 96–112)
Creatinine, Ser: 1 mg/dL (ref 0.4–1.2)
GFR: 56.95 mL/min — ABNORMAL LOW (ref >60.00)
Glucose, Bld: 97 mg/dL (ref 70–99)
Potassium: 4.3 mEq/L (ref 3.5–5.1)
Sodium: 136 mEq/L (ref 135–145)

## 2013-07-18 NOTE — Progress Notes (Signed)
Subjective:    Patient ID: Theresa Norton, female    DOB: 1928/11/01, 80 y.o.   MRN: [id number]  HPI Hospital followup. Refer to recent note for details. She had complete heart block back in January and then subsequently presented with left chest pains and dyspnea and was noted to have pericardial effusion. This was drained and patient placed on colchicine. She had left pleural effusion which was removed by thoracentesis. She had other medical problems including hyponatremia, anemia with iron deficiency, and acute renal failure.  She is now seen following readmission on 07/07/2013 through 07/11/2013. She was readmitted with dyspnea and weakness. She was noted to have reaccumulation of moderate left pleural effusion. She underwent thoracentesis with over one and a half liters of fluid removed. Patient symptomatically stable this time with regard to her pulmonary status. She had followup chest x-ray on 07/15/2013 which did not show any significant reaccumulation of fluid. She remains on low-dose furosemide 20 mg daily.  She has some chronic mild hyponatremia sodium of 131-133 range. Hemoglobin has been 9-10 range. She complains of some general fatigue but denies any chest pains or dyspnea. No peripheral edema issues. Blood pressures been stable.  Past Medical History  Diagnosis Date  . Allergy   . Hypertension   . Colon polyps   . Heart block AV second degree     a. s/p MDT Addapta dual chamber pacemaker 05/2013 by Dr Rayann Heman.  Marland Kitchen PAF (paroxysmal atrial fibrillation)     a. Found post-pacer 05/2013->Xarelto added;  b. 05/2013 Echo: EF 60-65%, mild LVH, nl wall motion w/o rwma.  . Groin hematoma     a. 05/2013 R groin hematoma post-cath - u/s 05/31/13 large 7.49 cm r inguinal hematoma extending into thigh, no psa or avf.  . Anemia   . CAD (coronary artery disease)     a. 05/2013 nonobs dzs by cath.  . Pacemaker   . H/O hiatal hernia   . Arthritis     "in my fingers" (07/08/2013)  . On home  oxygen therapy     "2L maybe 12h/day" (07/08/2013)   Past Surgical History  Procedure Laterality Date  . Bunionectomy Bilateral 1967  . Breast cyst excision Right 1959; 1970's  . Insert / replace / remove pacemaker  05/2013    MDT ADDRL1 implanted by Dr Rayann Heman for heart block  . Insert / replace / remove pacemaker  06/2013    Right ventricular lead perforation with tamponade  . Cardiac catheterization    . Cataract extraction w/ intraocular lens  implant, bilateral Bilateral   . Dilation and curettage of uterus    . Varicose vein surgery Bilateral ~ 2002    reports that she has never smoked. She has never used smokeless tobacco. She reports that she drinks about 8.4 ounces of alcohol per week. She reports that she does not use illicit drugs. family history includes Cancer in her father. Allergies  Allergen Reactions  . Codeine     GI upset  . Morphine And Related     GI upset      Review of Systems  Constitutional: Positive for fatigue.  Eyes: Negative for visual disturbance.  Respiratory: Negative for cough, chest tightness and wheezing.   Cardiovascular: Negative for chest pain, palpitations and leg swelling.  Endocrine: Negative for polydipsia and polyuria.  Neurological: Negative for dizziness, seizures, syncope, weakness, light-headedness and headaches.       Objective:   Physical Exam  Constitutional: She is oriented to person,  place, and time. She appears well-developed and well-nourished.  Neck: Neck supple. No thyromegaly present.  Cardiovascular: Normal rate.   Pulmonary/Chest: Effort normal.  Only minimally diminished breath sounds left base compared to right. No wheezes. Few faint crackles left base.  Musculoskeletal: She exhibits no edema.  Neurological: She is alert and oriented to person, place, and time.          Assessment & Plan:  #1 recurrent left pleural effusion. Clinically, she appears well today. She is pulse oximetry 97%. She is fairly good  aeration in her left base with only minimally diminished compared to right side. Recent x-ray as above not showing significant reaccumulation 3 days ago. Continue to observe for now. #2 hypertension which is stable at goal. Continue current medications #3 history of recent complete heart block now with pacemaker #4 anemia. Repeat CBC #5 hyponatremia. Repeat basic metabolic panel

## 2013-07-18 NOTE — Progress Notes (Signed)
Pre visit review using our clinic review tool, if applicable. No additional management support is needed unless otherwise documented below in the visit note. 

## 2013-07-19 ENCOUNTER — Encounter: Payer: Self-pay | Admitting: Family Medicine

## 2013-07-21 ENCOUNTER — Telehealth: Payer: Self-pay | Admitting: Family Medicine

## 2013-07-21 DIAGNOSIS — D509 Iron deficiency anemia, unspecified: Secondary | ICD-10-CM | POA: Diagnosis not present

## 2013-07-21 DIAGNOSIS — I442 Atrioventricular block, complete: Secondary | ICD-10-CM | POA: Diagnosis not present

## 2013-07-21 DIAGNOSIS — I10 Essential (primary) hypertension: Secondary | ICD-10-CM | POA: Diagnosis not present

## 2013-07-21 DIAGNOSIS — Z7901 Long term (current) use of anticoagulants: Secondary | ICD-10-CM | POA: Diagnosis not present

## 2013-07-21 DIAGNOSIS — Z48812 Encounter for surgical aftercare following surgery on the circulatory system: Secondary | ICD-10-CM | POA: Diagnosis not present

## 2013-07-21 DIAGNOSIS — Z45018 Encounter for adjustment and management of other part of cardiac pacemaker: Secondary | ICD-10-CM | POA: Diagnosis not present

## 2013-07-21 NOTE — Telephone Encounter (Signed)
Relevant patient education assigned to patient using Emmi. ° °

## 2013-07-22 DIAGNOSIS — I10 Essential (primary) hypertension: Secondary | ICD-10-CM | POA: Diagnosis not present

## 2013-07-22 DIAGNOSIS — I442 Atrioventricular block, complete: Secondary | ICD-10-CM | POA: Diagnosis not present

## 2013-07-22 DIAGNOSIS — Z48812 Encounter for surgical aftercare following surgery on the circulatory system: Secondary | ICD-10-CM | POA: Diagnosis not present

## 2013-07-22 DIAGNOSIS — Z45018 Encounter for adjustment and management of other part of cardiac pacemaker: Secondary | ICD-10-CM | POA: Diagnosis not present

## 2013-07-22 DIAGNOSIS — Z7901 Long term (current) use of anticoagulants: Secondary | ICD-10-CM | POA: Diagnosis not present

## 2013-07-22 DIAGNOSIS — D509 Iron deficiency anemia, unspecified: Secondary | ICD-10-CM | POA: Diagnosis not present

## 2013-07-23 DIAGNOSIS — D509 Iron deficiency anemia, unspecified: Secondary | ICD-10-CM | POA: Diagnosis not present

## 2013-07-23 DIAGNOSIS — Z7901 Long term (current) use of anticoagulants: Secondary | ICD-10-CM | POA: Diagnosis not present

## 2013-07-23 DIAGNOSIS — I442 Atrioventricular block, complete: Secondary | ICD-10-CM | POA: Diagnosis not present

## 2013-07-23 DIAGNOSIS — I10 Essential (primary) hypertension: Secondary | ICD-10-CM | POA: Diagnosis not present

## 2013-07-23 DIAGNOSIS — Z45018 Encounter for adjustment and management of other part of cardiac pacemaker: Secondary | ICD-10-CM | POA: Diagnosis not present

## 2013-07-23 DIAGNOSIS — Z48812 Encounter for surgical aftercare following surgery on the circulatory system: Secondary | ICD-10-CM | POA: Diagnosis not present

## 2013-07-24 ENCOUNTER — Ambulatory Visit: Payer: Medicare Other | Admitting: Family Medicine

## 2013-07-24 DIAGNOSIS — Z961 Presence of intraocular lens: Secondary | ICD-10-CM | POA: Diagnosis not present

## 2013-07-24 DIAGNOSIS — H353290 Exudative age-related macular degeneration, unspecified eye, stage unspecified: Secondary | ICD-10-CM | POA: Diagnosis not present

## 2013-07-24 DIAGNOSIS — H353190 Nonexudative age-related macular degeneration, unspecified eye, stage unspecified: Secondary | ICD-10-CM | POA: Diagnosis not present

## 2013-07-25 ENCOUNTER — Ambulatory Visit
Admission: RE | Admit: 2013-07-25 | Discharge: 2013-07-25 | Disposition: A | Discharge status: Home / Self Care | Payer: Medicare Other | Source: Ambulatory Visit | Attending: Cardiovascular Disease | Admitting: Cardiovascular Disease

## 2013-07-25 ENCOUNTER — Other Ambulatory Visit: Payer: Self-pay | Admitting: Cardiovascular Disease

## 2013-07-25 DIAGNOSIS — R0602 Shortness of breath: Secondary | ICD-10-CM

## 2013-07-25 DIAGNOSIS — Z7901 Long term (current) use of anticoagulants: Secondary | ICD-10-CM | POA: Diagnosis not present

## 2013-07-25 DIAGNOSIS — I10 Essential (primary) hypertension: Secondary | ICD-10-CM | POA: Diagnosis not present

## 2013-07-25 DIAGNOSIS — D509 Iron deficiency anemia, unspecified: Secondary | ICD-10-CM | POA: Diagnosis not present

## 2013-07-25 DIAGNOSIS — Z48812 Encounter for surgical aftercare following surgery on the circulatory system: Secondary | ICD-10-CM | POA: Diagnosis not present

## 2013-07-25 DIAGNOSIS — I2510 Atherosclerotic heart disease of native coronary artery without angina pectoris: Secondary | ICD-10-CM | POA: Diagnosis not present

## 2013-07-25 DIAGNOSIS — D500 Iron deficiency anemia secondary to blood loss (chronic): Secondary | ICD-10-CM | POA: Diagnosis not present

## 2013-07-25 DIAGNOSIS — Z950 Presence of cardiac pacemaker: Secondary | ICD-10-CM | POA: Diagnosis not present

## 2013-07-25 DIAGNOSIS — J90 Pleural effusion, not elsewhere classified: Secondary | ICD-10-CM | POA: Diagnosis not present

## 2013-07-25 DIAGNOSIS — I442 Atrioventricular block, complete: Secondary | ICD-10-CM | POA: Diagnosis not present

## 2013-07-25 DIAGNOSIS — Z45018 Encounter for adjustment and management of other part of cardiac pacemaker: Secondary | ICD-10-CM | POA: Diagnosis not present

## 2013-07-25 LAB — AFB CULTURE WITH SMEAR (NOT AT ARMC): Acid Fast Smear: NONE SEEN

## 2013-07-29 ENCOUNTER — Telehealth: Payer: Self-pay | Admitting: Family Medicine

## 2013-07-29 LAB — AFB CULTURE WITH SMEAR (NOT AT ARMC): Acid Fast Smear: NONE SEEN

## 2013-07-29 NOTE — Telephone Encounter (Signed)
Gerald Stabs would like to re-cert pt for another 60 days of home health care.  She needs this re-cert before 2/83/66.  Gerald Stabs states she can take a verbal order.

## 2013-07-29 NOTE — Telephone Encounter (Signed)
OK 

## 2013-07-29 NOTE — Telephone Encounter (Signed)
Gerald Stabs is informed

## 2013-07-30 DIAGNOSIS — I442 Atrioventricular block, complete: Secondary | ICD-10-CM | POA: Diagnosis not present

## 2013-07-30 DIAGNOSIS — Z48812 Encounter for surgical aftercare following surgery on the circulatory system: Secondary | ICD-10-CM | POA: Diagnosis not present

## 2013-07-30 DIAGNOSIS — I10 Essential (primary) hypertension: Secondary | ICD-10-CM | POA: Diagnosis not present

## 2013-07-30 DIAGNOSIS — Z7901 Long term (current) use of anticoagulants: Secondary | ICD-10-CM | POA: Diagnosis not present

## 2013-07-30 DIAGNOSIS — D509 Iron deficiency anemia, unspecified: Secondary | ICD-10-CM | POA: Diagnosis not present

## 2013-07-30 DIAGNOSIS — Z45018 Encounter for adjustment and management of other part of cardiac pacemaker: Secondary | ICD-10-CM | POA: Diagnosis not present

## 2013-08-01 DIAGNOSIS — J90 Pleural effusion, not elsewhere classified: Secondary | ICD-10-CM | POA: Diagnosis not present

## 2013-08-01 DIAGNOSIS — I10 Essential (primary) hypertension: Secondary | ICD-10-CM | POA: Diagnosis not present

## 2013-08-01 DIAGNOSIS — Z48812 Encounter for surgical aftercare following surgery on the circulatory system: Secondary | ICD-10-CM | POA: Diagnosis not present

## 2013-08-01 DIAGNOSIS — I442 Atrioventricular block, complete: Secondary | ICD-10-CM | POA: Diagnosis not present

## 2013-08-01 DIAGNOSIS — Z7901 Long term (current) use of anticoagulants: Secondary | ICD-10-CM | POA: Diagnosis not present

## 2013-08-01 DIAGNOSIS — Z45018 Encounter for adjustment and management of other part of cardiac pacemaker: Secondary | ICD-10-CM | POA: Diagnosis not present

## 2013-08-01 DIAGNOSIS — D509 Iron deficiency anemia, unspecified: Secondary | ICD-10-CM | POA: Diagnosis not present

## 2013-08-01 DIAGNOSIS — D62 Acute posthemorrhagic anemia: Secondary | ICD-10-CM | POA: Diagnosis not present

## 2013-08-05 DIAGNOSIS — I442 Atrioventricular block, complete: Secondary | ICD-10-CM | POA: Diagnosis not present

## 2013-08-05 DIAGNOSIS — I10 Essential (primary) hypertension: Secondary | ICD-10-CM | POA: Diagnosis not present

## 2013-08-05 DIAGNOSIS — D509 Iron deficiency anemia, unspecified: Secondary | ICD-10-CM | POA: Diagnosis not present

## 2013-08-05 DIAGNOSIS — Z48812 Encounter for surgical aftercare following surgery on the circulatory system: Secondary | ICD-10-CM | POA: Diagnosis not present

## 2013-08-05 DIAGNOSIS — D62 Acute posthemorrhagic anemia: Secondary | ICD-10-CM | POA: Diagnosis not present

## 2013-08-05 DIAGNOSIS — Z45018 Encounter for adjustment and management of other part of cardiac pacemaker: Secondary | ICD-10-CM | POA: Diagnosis not present

## 2013-08-07 DIAGNOSIS — Z48812 Encounter for surgical aftercare following surgery on the circulatory system: Secondary | ICD-10-CM | POA: Diagnosis not present

## 2013-08-07 DIAGNOSIS — D509 Iron deficiency anemia, unspecified: Secondary | ICD-10-CM | POA: Diagnosis not present

## 2013-08-07 DIAGNOSIS — I10 Essential (primary) hypertension: Secondary | ICD-10-CM | POA: Diagnosis not present

## 2013-08-07 DIAGNOSIS — D62 Acute posthemorrhagic anemia: Secondary | ICD-10-CM | POA: Diagnosis not present

## 2013-08-07 DIAGNOSIS — Z45018 Encounter for adjustment and management of other part of cardiac pacemaker: Secondary | ICD-10-CM | POA: Diagnosis not present

## 2013-08-07 DIAGNOSIS — I442 Atrioventricular block, complete: Secondary | ICD-10-CM | POA: Diagnosis not present

## 2013-08-12 DIAGNOSIS — Z45018 Encounter for adjustment and management of other part of cardiac pacemaker: Secondary | ICD-10-CM | POA: Diagnosis not present

## 2013-08-12 DIAGNOSIS — I442 Atrioventricular block, complete: Secondary | ICD-10-CM | POA: Diagnosis not present

## 2013-08-12 DIAGNOSIS — Z48812 Encounter for surgical aftercare following surgery on the circulatory system: Secondary | ICD-10-CM | POA: Diagnosis not present

## 2013-08-12 DIAGNOSIS — I10 Essential (primary) hypertension: Secondary | ICD-10-CM | POA: Diagnosis not present

## 2013-08-12 DIAGNOSIS — D62 Acute posthemorrhagic anemia: Secondary | ICD-10-CM | POA: Diagnosis not present

## 2013-08-12 DIAGNOSIS — D509 Iron deficiency anemia, unspecified: Secondary | ICD-10-CM | POA: Diagnosis not present

## 2013-08-13 DIAGNOSIS — I442 Atrioventricular block, complete: Secondary | ICD-10-CM | POA: Diagnosis not present

## 2013-08-13 DIAGNOSIS — D509 Iron deficiency anemia, unspecified: Secondary | ICD-10-CM | POA: Diagnosis not present

## 2013-08-13 DIAGNOSIS — H353190 Nonexudative age-related macular degeneration, unspecified eye, stage unspecified: Secondary | ICD-10-CM | POA: Diagnosis not present

## 2013-08-13 DIAGNOSIS — Z961 Presence of intraocular lens: Secondary | ICD-10-CM | POA: Diagnosis not present

## 2013-08-13 DIAGNOSIS — H353290 Exudative age-related macular degeneration, unspecified eye, stage unspecified: Secondary | ICD-10-CM | POA: Diagnosis not present

## 2013-08-13 DIAGNOSIS — Z45018 Encounter for adjustment and management of other part of cardiac pacemaker: Secondary | ICD-10-CM | POA: Diagnosis not present

## 2013-08-13 DIAGNOSIS — Z48812 Encounter for surgical aftercare following surgery on the circulatory system: Secondary | ICD-10-CM | POA: Diagnosis not present

## 2013-08-13 DIAGNOSIS — D62 Acute posthemorrhagic anemia: Secondary | ICD-10-CM | POA: Diagnosis not present

## 2013-08-13 DIAGNOSIS — I10 Essential (primary) hypertension: Secondary | ICD-10-CM | POA: Diagnosis not present

## 2013-08-14 ENCOUNTER — Telehealth: Payer: Self-pay | Admitting: Family Medicine

## 2013-08-14 DIAGNOSIS — D62 Acute posthemorrhagic anemia: Secondary | ICD-10-CM | POA: Diagnosis not present

## 2013-08-14 DIAGNOSIS — Z48812 Encounter for surgical aftercare following surgery on the circulatory system: Secondary | ICD-10-CM | POA: Diagnosis not present

## 2013-08-14 DIAGNOSIS — Z45018 Encounter for adjustment and management of other part of cardiac pacemaker: Secondary | ICD-10-CM | POA: Diagnosis not present

## 2013-08-14 DIAGNOSIS — D509 Iron deficiency anemia, unspecified: Secondary | ICD-10-CM | POA: Diagnosis not present

## 2013-08-14 DIAGNOSIS — I442 Atrioventricular block, complete: Secondary | ICD-10-CM | POA: Diagnosis not present

## 2013-08-14 DIAGNOSIS — I10 Essential (primary) hypertension: Secondary | ICD-10-CM | POA: Diagnosis not present

## 2013-08-14 NOTE — Telephone Encounter (Signed)
Left message for Theresa Norton to call back.

## 2013-08-14 NOTE — Telephone Encounter (Signed)
OK to continue? 

## 2013-08-14 NOTE — Telephone Encounter (Signed)
Theresa Norton pt home health care is req verbal order to continue pt  1x week for 1week 2x week for 3 weeks

## 2013-08-14 NOTE — Telephone Encounter (Signed)
Phone number for [facility] home [facility] [phone]

## 2013-08-15 ENCOUNTER — Encounter: Payer: Self-pay | Admitting: Family Medicine

## 2013-08-15 ENCOUNTER — Ambulatory Visit (INDEPENDENT_AMBULATORY_CARE_PROVIDER_SITE_OTHER): Payer: Medicare Other | Admitting: Family Medicine

## 2013-08-15 VITALS — BP 132/76 | HR 80 | Wt 142.0 lb

## 2013-08-15 DIAGNOSIS — I10 Essential (primary) hypertension: Secondary | ICD-10-CM | POA: Diagnosis not present

## 2013-08-15 DIAGNOSIS — H9190 Unspecified hearing loss, unspecified ear: Secondary | ICD-10-CM

## 2013-08-15 DIAGNOSIS — H9193 Unspecified hearing loss, bilateral: Secondary | ICD-10-CM

## 2013-08-15 DIAGNOSIS — J90 Pleural effusion, not elsewhere classified: Secondary | ICD-10-CM | POA: Diagnosis not present

## 2013-08-15 NOTE — Progress Notes (Signed)
Subjective:    Patient ID: Theresa Norton, female    DOB: 11/18/1928, 80 y.o.   MRN: [id number]  HPI Patient seen for medical followup. Refer to recent note  "Hospital followup. Refer to recent note for details. She had complete heart block back in January and then subsequently presented with left chest pains and dyspnea and was noted to have pericardial effusion. This was drained and patient placed on colchicine. She had left pleural effusion which was removed by thoracentesis. She had other medical problems including hyponatremia, anemia with iron deficiency, and acute renal failure.  She is now seen following readmission on 07/07/2013 through 07/11/2013. She was readmitted with dyspnea and weakness. She was noted to have reaccumulation of moderate left pleural effusion. She underwent thoracentesis with over one and a half liters of fluid removed. Patient symptomatically stable this time with regard to her pulmonary status. She had followup chest x-ray on 07/15/2013 which did not show any significant reaccumulation of fluid. She remains on low-dose furosemide 20 mg daily.  She has some chronic mild hyponatremia sodium of 131-133 range. Hemoglobin has been 9-10 range. She complains of some general fatigue but denies any chest pains or dyspnea. No peripheral edema issues. Blood pressures been stable"  She's done well since last visit. She had followup chest x-ray done after her last visit here which did not show any progression of her pleural effusion. She feels that her dyspnea is reduced and her overall energy has improved over the past couple of weeks. Medications reviewed. She cannot clarify whether she's taking losartan.   Other medications are as listed. She has stopped the colchicine. No chest pains. No fevers or chills. She has anemia with most recent hemoglobin 9.9. She is eating well. No orthostatic symptoms.  She is complaining of some progressive bilateral hearing loss. No recent  audiology assessment. No vertigo  Past Medical History  Diagnosis Date  . Allergy   . Hypertension   . Colon polyps   . Heart block AV second degree     a. s/p MDT Addapta dual chamber pacemaker 05/2013 by Dr Rayann Heman.  Marland Kitchen PAF (paroxysmal atrial fibrillation)     a. Found post-pacer 05/2013->Xarelto added;  b. 05/2013 Echo: EF 60-65%, mild LVH, nl wall motion w/o rwma.  . Groin hematoma     a. 05/2013 R groin hematoma post-cath - u/s 05/31/13 large 7.49 cm r inguinal hematoma extending into thigh, no psa or avf.  . Anemia   . CAD (coronary artery disease)     a. 05/2013 nonobs dzs by cath.  . Pacemaker   . H/O hiatal hernia   . Arthritis     "in my fingers" (07/08/2013)  . On home oxygen therapy     "2L maybe 12h/day" (07/08/2013)   Past Surgical History  Procedure Laterality Date  . Bunionectomy Bilateral 1967  . Breast cyst excision Right 1959; 1970's  . Insert / replace / remove pacemaker  05/2013    MDT ADDRL1 implanted by Dr Rayann Heman for heart block  . Insert / replace / remove pacemaker  06/2013    Right ventricular lead perforation with tamponade  . Cardiac catheterization    . Cataract extraction w/ intraocular lens  implant, bilateral Bilateral   . Dilation and curettage of uterus    . Varicose vein surgery Bilateral ~ 2002    reports that she has never smoked. She has never used smokeless tobacco. She reports that she drinks about 8.4 ounces of alcohol per  week. She reports that she does not use illicit drugs. family history includes Cancer in her father. Allergies  Allergen Reactions  . Codeine     GI upset  . Morphine And Related     GI upset      Review of Systems  Constitutional: Negative for fever, chills, appetite change and unexpected weight change.  Respiratory: Negative for cough.   Cardiovascular: Negative for chest pain.  Neurological: Negative for dizziness and syncope.  Psychiatric/Behavioral: Negative for confusion.       Objective:   Physical Exam    Constitutional: She appears well-developed and well-nourished.  Neck: Neck supple. No thyromegaly present.  Cardiovascular: Normal rate.   Pulmonary/Chest: Effort normal and breath sounds normal. No respiratory distress. She has no wheezes. She has no rales.  Musculoskeletal: She exhibits no edema.          Assessment & Plan:  #1 hypertension. Stable by today's reading. Confirm whether she's taking losartan #2 history of recent pericardial effusion. Symptomatically stable. She had pleural effusion which was stable by recent chest x-ray #3 bilateral hearing loss. Unremarkable ear exam. No cerumen impaction. Set up audiology assessment.

## 2013-08-15 NOTE — Telephone Encounter (Signed)
Left message for Theresa Norton to return call

## 2013-08-15 NOTE — Progress Notes (Signed)
Pre visit review using our clinic review tool, if applicable. No additional management support is needed unless otherwise documented below in the visit note. 

## 2013-08-16 DIAGNOSIS — I10 Essential (primary) hypertension: Secondary | ICD-10-CM | POA: Diagnosis not present

## 2013-08-16 DIAGNOSIS — Z45018 Encounter for adjustment and management of other part of cardiac pacemaker: Secondary | ICD-10-CM | POA: Diagnosis not present

## 2013-08-16 DIAGNOSIS — Z48812 Encounter for surgical aftercare following surgery on the circulatory system: Secondary | ICD-10-CM | POA: Diagnosis not present

## 2013-08-16 DIAGNOSIS — D509 Iron deficiency anemia, unspecified: Secondary | ICD-10-CM | POA: Diagnosis not present

## 2013-08-16 DIAGNOSIS — I442 Atrioventricular block, complete: Secondary | ICD-10-CM | POA: Diagnosis not present

## 2013-08-16 DIAGNOSIS — D62 Acute posthemorrhagic anemia: Secondary | ICD-10-CM | POA: Diagnosis not present

## 2013-08-18 NOTE — Telephone Encounter (Signed)
Left message for dennis to return call

## 2013-08-19 DIAGNOSIS — I10 Essential (primary) hypertension: Secondary | ICD-10-CM | POA: Diagnosis not present

## 2013-08-19 DIAGNOSIS — Z45018 Encounter for adjustment and management of other part of cardiac pacemaker: Secondary | ICD-10-CM | POA: Diagnosis not present

## 2013-08-19 DIAGNOSIS — D509 Iron deficiency anemia, unspecified: Secondary | ICD-10-CM | POA: Diagnosis not present

## 2013-08-19 DIAGNOSIS — D62 Acute posthemorrhagic anemia: Secondary | ICD-10-CM | POA: Diagnosis not present

## 2013-08-19 DIAGNOSIS — Z48812 Encounter for surgical aftercare following surgery on the circulatory system: Secondary | ICD-10-CM | POA: Diagnosis not present

## 2013-08-19 DIAGNOSIS — I442 Atrioventricular block, complete: Secondary | ICD-10-CM | POA: Diagnosis not present

## 2013-08-20 DIAGNOSIS — Z48812 Encounter for surgical aftercare following surgery on the circulatory system: Secondary | ICD-10-CM | POA: Diagnosis not present

## 2013-08-20 DIAGNOSIS — I442 Atrioventricular block, complete: Secondary | ICD-10-CM | POA: Diagnosis not present

## 2013-08-20 DIAGNOSIS — Z45018 Encounter for adjustment and management of other part of cardiac pacemaker: Secondary | ICD-10-CM | POA: Diagnosis not present

## 2013-08-20 DIAGNOSIS — D62 Acute posthemorrhagic anemia: Secondary | ICD-10-CM | POA: Diagnosis not present

## 2013-08-20 DIAGNOSIS — D509 Iron deficiency anemia, unspecified: Secondary | ICD-10-CM | POA: Diagnosis not present

## 2013-08-20 DIAGNOSIS — I10 Essential (primary) hypertension: Secondary | ICD-10-CM | POA: Diagnosis not present

## 2013-08-21 DIAGNOSIS — H353290 Exudative age-related macular degeneration, unspecified eye, stage unspecified: Secondary | ICD-10-CM | POA: Diagnosis not present

## 2013-08-21 DIAGNOSIS — H353190 Nonexudative age-related macular degeneration, unspecified eye, stage unspecified: Secondary | ICD-10-CM | POA: Diagnosis not present

## 2013-08-21 DIAGNOSIS — Z961 Presence of intraocular lens: Secondary | ICD-10-CM | POA: Diagnosis not present

## 2013-08-23 ENCOUNTER — Emergency Department (HOSPITAL_BASED_OUTPATIENT_CLINIC_OR_DEPARTMENT_OTHER)
Admission: EM | Admit: 2013-08-23 | Discharge: 2013-08-23 | Disposition: A | Discharge status: Home / Self Care | Payer: Medicare Other | Attending: Emergency Medicine | Admitting: Emergency Medicine

## 2013-08-23 ENCOUNTER — Emergency Department (HOSPITAL_BASED_OUTPATIENT_CLINIC_OR_DEPARTMENT_OTHER): Payer: Medicare Other

## 2013-08-23 ENCOUNTER — Encounter (HOSPITAL_BASED_OUTPATIENT_CLINIC_OR_DEPARTMENT_OTHER): Payer: Self-pay | Admitting: Emergency Medicine

## 2013-08-23 DIAGNOSIS — Z87828 Personal history of other (healed) physical injury and trauma: Secondary | ICD-10-CM | POA: Diagnosis not present

## 2013-08-23 DIAGNOSIS — Z860100 Personal history of colon polyps, unspecified: Secondary | ICD-10-CM | POA: Insufficient documentation

## 2013-08-23 DIAGNOSIS — Z8739 Personal history of other diseases of the musculoskeletal system and connective tissue: Secondary | ICD-10-CM | POA: Insufficient documentation

## 2013-08-23 DIAGNOSIS — Z45018 Encounter for adjustment and management of other part of cardiac pacemaker: Secondary | ICD-10-CM | POA: Diagnosis not present

## 2013-08-23 DIAGNOSIS — I4891 Unspecified atrial fibrillation: Secondary | ICD-10-CM | POA: Insufficient documentation

## 2013-08-23 DIAGNOSIS — J90 Pleural effusion, not elsewhere classified: Secondary | ICD-10-CM

## 2013-08-23 DIAGNOSIS — R05 Cough: Secondary | ICD-10-CM | POA: Insufficient documentation

## 2013-08-23 DIAGNOSIS — Z79899 Other long term (current) drug therapy: Secondary | ICD-10-CM | POA: Diagnosis not present

## 2013-08-23 DIAGNOSIS — Z8719 Personal history of other diseases of the digestive system: Secondary | ICD-10-CM | POA: Diagnosis not present

## 2013-08-23 DIAGNOSIS — D62 Acute posthemorrhagic anemia: Secondary | ICD-10-CM | POA: Diagnosis not present

## 2013-08-23 DIAGNOSIS — I10 Essential (primary) hypertension: Secondary | ICD-10-CM | POA: Diagnosis not present

## 2013-08-23 DIAGNOSIS — Z9889 Other specified postprocedural states: Secondary | ICD-10-CM | POA: Diagnosis not present

## 2013-08-23 DIAGNOSIS — D509 Iron deficiency anemia, unspecified: Secondary | ICD-10-CM | POA: Diagnosis not present

## 2013-08-23 DIAGNOSIS — N390 Urinary tract infection, site not specified: Secondary | ICD-10-CM | POA: Diagnosis not present

## 2013-08-23 DIAGNOSIS — I2510 Atherosclerotic heart disease of native coronary artery without angina pectoris: Secondary | ICD-10-CM | POA: Diagnosis not present

## 2013-08-23 DIAGNOSIS — Z950 Presence of cardiac pacemaker: Secondary | ICD-10-CM | POA: Diagnosis not present

## 2013-08-23 DIAGNOSIS — R0602 Shortness of breath: Secondary | ICD-10-CM | POA: Diagnosis not present

## 2013-08-23 DIAGNOSIS — J9819 Other pulmonary collapse: Secondary | ICD-10-CM | POA: Diagnosis not present

## 2013-08-23 DIAGNOSIS — R5383 Other fatigue: Secondary | ICD-10-CM | POA: Diagnosis not present

## 2013-08-23 DIAGNOSIS — R059 Cough, unspecified: Secondary | ICD-10-CM | POA: Diagnosis not present

## 2013-08-23 DIAGNOSIS — D649 Anemia, unspecified: Secondary | ICD-10-CM | POA: Diagnosis not present

## 2013-08-23 DIAGNOSIS — I442 Atrioventricular block, complete: Secondary | ICD-10-CM | POA: Diagnosis not present

## 2013-08-23 DIAGNOSIS — R5381 Other malaise: Secondary | ICD-10-CM | POA: Insufficient documentation

## 2013-08-23 DIAGNOSIS — Z9981 Dependence on supplemental oxygen: Secondary | ICD-10-CM | POA: Diagnosis not present

## 2013-08-23 DIAGNOSIS — Z48812 Encounter for surgical aftercare following surgery on the circulatory system: Secondary | ICD-10-CM | POA: Diagnosis not present

## 2013-08-23 DIAGNOSIS — R509 Fever, unspecified: Secondary | ICD-10-CM | POA: Insufficient documentation

## 2013-08-23 LAB — URINALYSIS, ROUTINE W REFLEX MICROSCOPIC
Bilirubin Urine: NEGATIVE
Glucose, UA: NEGATIVE mg/dL
Hgb urine dipstick: NEGATIVE
Ketones, ur: NEGATIVE mg/dL
Nitrite: NEGATIVE
Protein, ur: NEGATIVE mg/dL
Specific Gravity, Urine: 1.015 (ref 1.005–1.030)
Urobilinogen, UA: 0.2 mg/dL (ref 0.0–1.0)
pH: 5.5 (ref 5.0–8.0)

## 2013-08-23 LAB — BASIC METABOLIC PANEL
BUN: 15 mg/dL (ref 6–23)
CO2: 22 mEq/L (ref 19–32)
Calcium: 9 mg/dL (ref 8.4–10.5)
Chloride: 97 mEq/L (ref 96–112)
Creatinine, Ser: 0.7 mg/dL (ref 0.50–1.10)
GFR calc Af Amer: >90 mL/min (ref >90)
GFR calc non Af Amer: 79 mL/min — ABNORMAL LOW (ref >90)
Glucose, Bld: 97 mg/dL (ref 70–99)
Potassium: 4.8 mEq/L (ref 3.7–5.3)
Sodium: 134 mEq/L — ABNORMAL LOW (ref 137–147)

## 2013-08-23 LAB — CBC WITH DIFFERENTIAL/PLATELET
Basophils Absolute: 0 10*3/uL (ref 0.0–0.1)
Basophils Relative: 0 % (ref 0–1)
Eosinophils Absolute: 0.1 10*3/uL (ref 0.0–0.7)
Eosinophils Relative: 1 % (ref 0–5)
HCT: 29.3 % — ABNORMAL LOW (ref 36.0–46.0)
Hemoglobin: 9.8 g/dL — ABNORMAL LOW (ref 12.0–15.0)
Lymphocytes Relative: 16 % (ref 12–46)
Lymphs Abs: 1.5 10*3/uL (ref 0.7–4.0)
MCH: 29.9 pg (ref 26.0–34.0)
MCHC: 33.4 g/dL (ref 30.0–36.0)
MCV: 89.3 fL (ref 78.0–100.0)
Monocytes Absolute: 0.9 10*3/uL (ref 0.1–1.0)
Monocytes Relative: 9 % (ref 3–12)
Neutro Abs: 7.2 10*3/uL (ref 1.7–7.7)
Neutrophils Relative %: 74 % (ref 43–77)
Platelets: 312 10*3/uL (ref 150–400)
RBC: 3.28 MIL/uL — ABNORMAL LOW (ref 3.87–5.11)
RDW: 18.4 % — ABNORMAL HIGH (ref 11.5–15.5)
WBC: 9.7 10*3/uL (ref 4.0–10.5)

## 2013-08-23 LAB — URINE MICROSCOPIC-ADD ON

## 2013-08-23 MED ORDER — LEVOFLOXACIN 500 MG PO TABS: 500.0000 mg | ORAL_TABLET | Freq: Every day | ORAL | Status: DC

## 2013-08-23 MED ORDER — LEVOFLOXACIN 500 MG PO TABS
500.0000 mg | ORAL_TABLET | Freq: Once | ORAL | Status: AC
  Administered 2013-08-23: 500 mg via ORAL
  Filled 2013-08-23: qty 1

## 2013-08-23 NOTE — ED Notes (Signed)
Patient here with increasing exertional shortness of breath, complains of fatigue as well with low grade fever. Family member reports that patient has had problems with pleural effusion since pacemaker placed several weeks ago, no distress

## 2013-08-23 NOTE — ED Provider Notes (Signed)
CSN: [id number]     Arrival date & time 08/23/13  1745 History  This chart was scribed for Tanna Furry, MD by Elby Beck, ED Scribe. This patient was seen in room MH08/MH08 and the patient's care was started at 6:08 PM.   Chief Complaint  Patient presents with  . Shortness of Breath    The history is provided by the patient. No language interpreter was used.    HPI Comments: Theresa Norton is a 80 y.o. Female with a history of HTN who presents to the Emergency Department complaining of fatigue with associated generalized weakness over the past 3 days. She also reports an associated productive cough and low grade fever over the past 2 days. She reports a Tmax of 100.7 F today. ED temperature is 98.6 F. Pt states that she has been taking Tylenol with relief of her fever. She states that she has SOB at baseline, with no recent changes, although pt's daughter in the room believes that pt's SOB has been worse over the past few days. Pt states that she occasionally has mild dysuria, but she denies having this today. She states that she has been eating, drinking and urinating normally. Pt's daughter expresses concern that she took an at home blood pressure reading of 156/- earlier today. Pt states that she took her prescribed HTN medications today. Pt states that she had a pacemaker placement surgery with complications in January 2015. She states that she has needed a pericardial centesis and 2 thoracentesis procedures in the past few months. She states that she has followed up with her Cardiologist, and has been told that she is improving and having decreased pleural effusion.    Past Medical History  Diagnosis Date  . Allergy   . Hypertension   . Colon polyps   . Heart block AV second degree     a. s/p MDT Addapta dual chamber pacemaker 05/2013 by Dr Rayann Heman.  Marland Kitchen PAF (paroxysmal atrial fibrillation)     a. Found post-pacer 05/2013->Xarelto added;  b. 05/2013 Echo: EF 60-65%, mild LVH, nl wall  motion w/o rwma.  . Groin hematoma     a. 05/2013 R groin hematoma post-cath - u/s 05/31/13 large 7.49 cm r inguinal hematoma extending into thigh, no psa or avf.  . Anemia   . CAD (coronary artery disease)     a. 05/2013 nonobs dzs by cath.  . Pacemaker   . H/O hiatal hernia   . Arthritis     "in my fingers" (07/08/2013)  . On home oxygen therapy     "2L maybe 12h/day" (07/08/2013)  . Pacemaker    Past Surgical History  Procedure Laterality Date  . Bunionectomy Bilateral 1967  . Breast cyst excision Right 1959; 1970's  . Insert / replace / remove pacemaker  05/2013    MDT ADDRL1 implanted by Dr Rayann Heman for heart block  . Insert / replace / remove pacemaker  06/2013    Right ventricular lead perforation with tamponade  . Cardiac catheterization    . Cataract extraction w/ intraocular lens  implant, bilateral Bilateral   . Dilation and curettage of uterus    . Varicose vein surgery Bilateral ~ 2002   Family History  Problem Relation Age of Onset  . Cancer Father     lung   History  Substance Use Topics  . Smoking status: Never Smoker   . Smokeless tobacco: Never Used  . Alcohol Use: 8.4 oz/week    7 Glasses of wine, 7  Cans of beer per week     Comment: 07/08/2013 "2 glasses of wine or beer q d"   OB History   Grav Para Term Preterm Abortions TAB SAB Ect Mult Living                 Review of Systems  Constitutional: Positive for fever and fatigue. Negative for chills, diaphoresis and appetite change.  HENT: Negative for mouth sores, sore throat and trouble swallowing.   Eyes: Negative for visual disturbance.  Respiratory: Positive for cough and shortness of breath. Negative for chest tightness and wheezing.   Cardiovascular: Negative for chest pain.  Gastrointestinal: Negative for nausea, vomiting, abdominal pain, diarrhea and abdominal distention.  Endocrine: Negative for polydipsia, polyphagia and polyuria.  Genitourinary: Positive for dysuria. Negative for frequency and  hematuria.  Musculoskeletal: Negative for gait problem.  Skin: Negative for color change, pallor and rash.  Neurological: Positive for weakness (generalized). Negative for dizziness, syncope, light-headedness and headaches.  Hematological: Does not bruise/bleed easily.  Psychiatric/Behavioral: Negative for behavioral problems and confusion.    Allergies  Codeine and Morphine and related  Home Medications   Prior to Admission medications   Medication Sig Start Date End Date Taking? Authorizing Provider  acetaminophen (TYLENOL) 325 MG tablet Take 325 mg by mouth every 6 (six) hours as needed for mild pain.    Historical Provider, MD  ALPRAZolam Duanne Moron) 0.25 MG tablet Take 1 tablet (0.25 mg total) by mouth 2 (two) times daily as needed for anxiety. 05/29/13   Birdie Riddle, MD  digoxin (LANOXIN) 0.125 MG tablet Take 1 tablet (0.125 mg total) by mouth daily. 06/19/13   Birdie Riddle, MD  diltiazem (CARDIZEM CD) 180 MG 24 hr capsule Take 1 capsule (180 mg total) by mouth daily. 06/19/13   Birdie Riddle, MD  esomeprazole (NEXIUM) 40 MG capsule Take 40 mg by mouth daily at 12 noon.    Historical Provider, MD  furosemide (LASIX) 20 MG tablet Take 1 tablet (20 mg total) by mouth every Monday, Wednesday, and Friday. 06/20/13   Birdie Riddle, MD  IRON PO Take 1 tablet by mouth daily.     Historical Provider, MD  losartan (COZAAR) 100 MG tablet Take 1 tablet (100 mg total) by mouth daily. 01/20/13   Eulas Post, MD  megestrol (MEGACE) 40 MG/ML suspension Take 200 mg by mouth daily.    Historical Provider, MD  metoprolol (LOPRESSOR) 50 MG tablet Take 0.5 tablets (25 mg total) by mouth 2 (two) times daily. 06/19/13   Birdie Riddle, MD  potassium chloride (K-DUR) 10 MEQ tablet Take 1 tablet (10 mEq total) by mouth daily. 06/19/13   Birdie Riddle, MD   Triage Vitals: BP 147/97  Pulse 88  Temp(Src) 98.6 F (37 C) (Oral)  Resp 20  Ht 5\' 3"  (1.6 m)  Wt 134 lb (60.782 kg)  BMI 23.74 kg/m2  SpO2  100%  Physical Exam  Nursing note and vitals reviewed. Constitutional: She is oriented to person, place, and time. She appears well-developed and well-nourished. No distress.  HENT:  Head: Normocephalic.  Eyes: Conjunctivae are normal. Pupils are equal, round, and reactive to light. No scleral icterus.  Neck: Normal range of motion. Neck supple. No thyromegaly present.  Cardiovascular: Normal rate and regular rhythm.  Exam reveals no gallop and no friction rub.   No murmur heard. No JVD. No muffled heart tones.  Pulmonary/Chest: Effort normal and breath sounds normal. No respiratory distress. She has  no wheezes. She has no rales.  Symmetric breath sounds to the bilateral bases.  Abdominal: Soft. Bowel sounds are normal. She exhibits no distension. There is no tenderness. There is no rebound.  Musculoskeletal: Normal range of motion.  Neurological: She is alert and oriented to person, place, and time.  Skin: Skin is warm and dry. No rash noted.  Psychiatric: She has a normal mood and affect. Her behavior is normal.    ED Course  Procedures (including critical care time)  DIAGNOSTIC STUDIES: Oxygen Saturation is 100% on RA, normal by my interpretation.    COORDINATION OF CARE: 6:17 PM- Discussed plan to obtain a CXR. Pt advised of plan for treatment and pt agrees.  Labs Review Labs Reviewed  URINALYSIS, ROUTINE W REFLEX MICROSCOPIC - Abnormal; Notable for the following:    Leukocytes, UA MODERATE (*)    All other components within normal limits  CBC WITH DIFFERENTIAL - Abnormal; Notable for the following:    RBC 3.28 (*)    Hemoglobin 9.8 (*)    HCT 29.3 (*)    RDW 18.4 (*)    All other components within normal limits  BASIC METABOLIC PANEL - Abnormal; Notable for the following:    Sodium 134 (*)    GFR calc non Af Amer 79 (*)    All other components within normal limits  URINE MICROSCOPIC-ADD ON - Abnormal; Notable for the following:    Squamous Epithelial / LPF FEW (*)     Bacteria, UA MANY (*)    All other components within normal limits  URINE CULTURE    Imaging Review No results found.   EKG Interpretation   Date/Time:  Saturday August 23 2013 18:32:09 EDT Ventricular Rate:  91 PR Interval:  166 QRS Duration: 150 QT Interval:  384 QTC Calculation: 472 R Axis:   19 Text Interpretation:  Atrial-sensed ventricular-paced rhythm Abnormal ECG  ED PHYSICIAN INTERPRETATION AVAILABLE IN [facility] Confirmed by  TEST, Record ([id number]) on 08/25/2013 7:18:15 AM      MDM   Final diagnoses:  Urinary tract infection  Pleural effusion          Tanna Furry, MD 08/27/13 [phone]

## 2013-08-23 NOTE — Discharge Instructions (Signed)
Recheck with your physician on Tuesday for culture results. Recheck here if any worsening symptoms.  Urinary Tract Infection A urinary tract infection (UTI) can occur any place along the urinary tract. The tract includes the kidneys, ureters, bladder, and urethra. A type of germ called bacteria often causes a UTI. UTIs are often helped with antibiotic medicine.  HOME CARE   If given, take antibiotics as told by your doctor. Finish them even if you start to feel better.  Drink enough fluids to keep your pee (urine) clear or pale yellow.  Avoid tea, drinks with caffeine, and bubbly (carbonated) drinks.  Pee often. Avoid holding your pee in for a long time.  Pee before and after having sex (intercourse).  Wipe from front to back after you poop (bowel movement) if you are a woman. Use each tissue only once. GET HELP RIGHT AWAY IF:   You have back pain.  You have lower belly (abdominal) pain.  You have chills.  You feel sick to your stomach (nauseous).  You throw up (vomit).  Your burning or discomfort with peeing does not go away.  You have a fever.  Your symptoms are not better in 3 days. MAKE SURE YOU:   Understand these instructions.  Will watch your condition.  Will get help right away if you are not doing well or get worse. Document Released: 10/11/2007 Document Revised: 01/17/2012 Document Reviewed: 11/23/2011 [facility] Patient Information 2014 [place], [place].  Pleural Effusion The lining covering your lungs and the inside of your chest is called the pleura. Usually, the space between the 2 pleura contains no air and only a thin layer of fluid. A pleural effusion is an abnormal buildup of fluid in the pleural space. Fluid gathers when there is increased pressure in the lung vessels. This forces fluids out of the lungs and into the pleural space. Vessels may also leak fluids when there are infections, such as pneumonia, or other causes of soreness and redness  (inflammation). Fluids leak into the lungs when protein in the blood is low or when certain vessels (lymphatics) are blocked. Finding a pleural effusion is important because it is usually caused by another disease. In order to treat a pleural effusion, your caregiver needs to find its cause. If left untreated, a large amount of fluid can build up and cause collapse of the lung. CAUSES   Heart failure.  Infections (pneumonia, tuberculosis), pulmonary embolism, pulmonary infarction.  Cancer (primary lung and metastatic), asbestosis.  Liver failure (cirrhosis).  Nephrotic syndrome, peritoneal dialysis, kidney problems (uremia).  Collagen vascular disease (systemic lupus erythematosis, rheumatoid arthritis).  Injury (trauma) to the chest or rupture of the digestive tube (esophagus).  Material in the chest or pleural space (hemothorax, chylothorax).  Pancreatitis.  Surgery.  Drug reactions. SYMPTOMS  A pleural effusion can decrease the amount of space available for breathing and make you short of breath. The fluid can become infected, which may cause pain and fever. Often, the pain is worse when taking a deep breath. The underlying disease (heart failure, pneumonia, blood clot, tuberculosis, cancer) may also cause symptoms. DIAGNOSIS   Your caregiver can usually tell what is wrong by talking to you (taking a history), doing an exam, and taking a routine X-ray. If the X-ray shows fluid in your chest, often fluid is removed from your chest with a needle for testing (diagnostic thoracentesis).  Sometimes, more specialized X-rays may be needed.  Sometimes, a small piece of tissue is removed and examined by a specialist (  biopsy). TREATMENT  Treatment varies based on what caused the pleural effusion. Treatments include:  Removing as much fluid as possible using a needle (thoracentesis) to improve the cough and shortness of breath. This is a simple procedure which can be done at bedside. The  risks are bleeding, infection, collapse of a lung, or low blood pressure.  Placing a tube in the chest to drain the effusion (tube thoracostomy). This is often used when there is an infection in the fluid. This is a simple procedure which can often be done at bedside or in a clinic. The procedure may be painful. The risks are the same as using a needle to drain the fluid. The chest tube usually remains for a few days and is connected to suction to improve fluid drainage. The tube, after placement, usually does not cause much discomfort.  Surgical removal of fibrous debris in and around the pleural space (decortication). This may be done with a flexible telescope (thoracoscope) through a small or large cut (incision). This is helpful for patients who have fibrosis or scar tissue that prevents complete lung expansion. The risks are infection, blood loss, and side effects from general anesthesia.  Sometimes, a procedure called pleurodesis is done. A chest tube is placed and the fluid is drained. Next, an agent (tetracycline, talc powder) is added to the pleural space. This causes the lung and chest wall to stick together (adhesion). This leaves no potential space for fluid to build up. The risks include infection, blood loss, and side effects from general anesthesia.  If the effusion is caused by infection, it may be treated with antibiotics and improve without draining. HOME CARE INSTRUCTIONS   Take any medicines exactly as prescribed.  Follow up with your caregiver as directed.  Monitor your exercise capacity (the amount of walking you can do before you get short of breath).  Do not smoke. Ask your caregiver for help quitting. SEEK MEDICAL CARE IF:   Your exercise capacity seems to get worse or does not improve with time.  You do not recover from your illness. SEEK IMMEDIATE MEDICAL CARE IF:   Shortness of breath or chest pain develops or gets worse.  You have an oral temperature above 102  F (38.9 C), not controlled by medicine.  You develop a new cough, especially if the mucus (phlegm) is discolored. MAKE SURE YOU:   Understand these instructions.  Will watch your condition.  Will get help right away if you are not doing well or get worse. Document Released: 04/24/2005 Document Revised: 02/12/2013 Document Reviewed: 12/14/2006 [facility] Patient Information 2014 [place].

## 2013-08-25 ENCOUNTER — Telehealth: Payer: Self-pay | Admitting: Family Medicine

## 2013-08-25 LAB — URINE CULTURE: Colony Count: >=100000

## 2013-08-25 NOTE — Telephone Encounter (Signed)
Left message for patient daughter to give a call back. Lab results are in patient chart.

## 2013-08-25 NOTE — Telephone Encounter (Signed)
Pt informed

## 2013-08-25 NOTE — Telephone Encounter (Signed)
Pt went to med center in [place]  Over the weekend for poss uti. Pt instructed to call pcp on Monday. Pt was given antibiotic and is feeling somewhat better. Daugher would like a cb

## 2013-08-25 NOTE — Telephone Encounter (Signed)
Urine cx showed multiple species so significance of infection questionable.

## 2013-08-26 DIAGNOSIS — Z950 Presence of cardiac pacemaker: Secondary | ICD-10-CM | POA: Diagnosis not present

## 2013-08-26 DIAGNOSIS — R079 Chest pain, unspecified: Secondary | ICD-10-CM | POA: Diagnosis not present

## 2013-08-26 DIAGNOSIS — R5383 Other fatigue: Secondary | ICD-10-CM | POA: Diagnosis not present

## 2013-08-26 DIAGNOSIS — R5381 Other malaise: Secondary | ICD-10-CM | POA: Diagnosis not present

## 2013-08-26 DIAGNOSIS — I2510 Atherosclerotic heart disease of native coronary artery without angina pectoris: Secondary | ICD-10-CM | POA: Diagnosis not present

## 2013-08-28 ENCOUNTER — Telehealth: Payer: Self-pay | Admitting: Family Medicine

## 2013-08-28 DIAGNOSIS — I10 Essential (primary) hypertension: Secondary | ICD-10-CM | POA: Diagnosis not present

## 2013-08-28 DIAGNOSIS — Z48812 Encounter for surgical aftercare following surgery on the circulatory system: Secondary | ICD-10-CM | POA: Diagnosis not present

## 2013-08-28 DIAGNOSIS — I442 Atrioventricular block, complete: Secondary | ICD-10-CM | POA: Diagnosis not present

## 2013-08-28 DIAGNOSIS — D509 Iron deficiency anemia, unspecified: Secondary | ICD-10-CM | POA: Diagnosis not present

## 2013-08-28 DIAGNOSIS — Z45018 Encounter for adjustment and management of other part of cardiac pacemaker: Secondary | ICD-10-CM | POA: Diagnosis not present

## 2013-08-28 DIAGNOSIS — D62 Acute posthemorrhagic anemia: Secondary | ICD-10-CM | POA: Diagnosis not present

## 2013-08-28 NOTE — Telephone Encounter (Signed)
done

## 2013-08-28 NOTE — Telephone Encounter (Signed)
Gerald Stabs RN from [facility] is calling to report pt has temp 100.7.Pt  is on levaquin for uti. Pt has been on med for 5 days now. Please advise

## 2013-08-28 NOTE — Telephone Encounter (Signed)
levaquin is very broad spectrum.  I would suspect another source of fever (other than UTI).  Recommend we assess her for source of fever.

## 2013-08-28 NOTE — Telephone Encounter (Signed)
Can you please call and set up appt for patient to be seen tomorrow.

## 2013-08-29 ENCOUNTER — Encounter: Payer: Self-pay | Admitting: Family Medicine

## 2013-08-29 ENCOUNTER — Ambulatory Visit (INDEPENDENT_AMBULATORY_CARE_PROVIDER_SITE_OTHER): Payer: Medicare Other | Admitting: Family Medicine

## 2013-08-29 VITALS — BP 130/72 | HR 84 | Temp 97.8°F | Wt 134.0 lb

## 2013-08-29 DIAGNOSIS — IMO0001 Reserved for inherently not codable concepts without codable children: Secondary | ICD-10-CM

## 2013-08-29 LAB — SEDIMENTATION RATE: Sed Rate: 72 mm/hr — ABNORMAL HIGH (ref 0–22)

## 2013-08-29 NOTE — Progress Notes (Signed)
Pre visit review using our clinic review tool, if applicable. No additional management support is needed unless otherwise documented below in the visit note. 

## 2013-08-29 NOTE — Progress Notes (Signed)
Subjective:    Patient ID: Theresa Norton, female    DOB: 06/26/1928, 80 y.o.   MRN: [id number]  HPI Patient seen with reports yesterday fever up to 5 days. Her recent history is that she went to emergency room about a week ago and was diagnosed with possible UTI. Urine culture grew out multiple morphotypes. She was treated with Levaquin. She was having some intermittent dysuria and increased odor but those symptoms improved by yesterday. She had temperatures as high as 100.7 yesterday but none today. She is complaining of extreme fatigue and also has noted achiness especially shoulders and back. She has occasional dry cough which has been more chronic. No sore throat. No nausea or vomiting. No abdominal pain. No flank pain. Recent CBC was unremarkable with exception of hemoglobin of 9.8 which has been chronically low. Electrolytes are unremarkable. No acute visual changes  Past Medical History  Diagnosis Date  . Allergy   . Hypertension   . Colon polyps   . Heart block AV second degree     a. s/p MDT Addapta dual chamber pacemaker 05/2013 by Dr Rayann Heman.  Marland Kitchen PAF (paroxysmal atrial fibrillation)     a. Found post-pacer 05/2013->Xarelto added;  b. 05/2013 Echo: EF 60-65%, mild LVH, nl wall motion w/o rwma.  . Groin hematoma     a. 05/2013 R groin hematoma post-cath - u/s 05/31/13 large 7.49 cm r inguinal hematoma extending into thigh, no psa or avf.  . Anemia   . CAD (coronary artery disease)     a. 05/2013 nonobs dzs by cath.  . Pacemaker   . H/O hiatal hernia   . Arthritis     "in my fingers" (07/08/2013)  . On home oxygen therapy     "2L maybe 12h/day" (07/08/2013)  . Pacemaker    Past Surgical History  Procedure Laterality Date  . Bunionectomy Bilateral 1967  . Breast cyst excision Right 1959; 1970's  . Insert / replace / remove pacemaker  05/2013    MDT ADDRL1 implanted by Dr Rayann Heman for heart block  . Insert / replace / remove pacemaker  06/2013    Right ventricular lead perforation  with tamponade  . Cardiac catheterization    . Cataract extraction w/ intraocular lens  implant, bilateral Bilateral   . Dilation and curettage of uterus    . Varicose vein surgery Bilateral ~ 2002    reports that she has never smoked. She has never used smokeless tobacco. She reports that she drinks about 8.4 ounces of alcohol per week. She reports that she does not use illicit drugs. family history includes Cancer in her father. Allergies  Allergen Reactions  . Codeine     GI upset  . Morphine And Related     GI upset      Review of Systems  Constitutional: Positive for fever and fatigue. Negative for chills.  HENT: Negative for sore throat.   Eyes: Negative for visual disturbance.  Respiratory: Positive for cough. Negative for shortness of breath.   Cardiovascular: Negative for chest pain.  Gastrointestinal: Negative for abdominal pain.  Musculoskeletal: Positive for myalgias.  Neurological: Positive for weakness. Negative for dizziness.       Objective:   Physical Exam  Constitutional: She appears well-developed and well-nourished.  HENT:  Right Ear: External ear normal.  Left Ear: External ear normal.  Mouth/Throat: Oropharynx is clear and moist.  Neck: Neck supple. No thyromegaly present.  Cardiovascular: Normal rate.   Pulmonary/Chest: Effort normal and breath  sounds normal. No respiratory distress. She has no wheezes. She has no rales.  Abdominal: Soft. There is no tenderness.  Musculoskeletal: She exhibits no edema.  Lymphadenopathy:    She has no cervical adenopathy.  Skin: No rash noted.          Assessment & Plan:  Patient presents with nonspecific symptoms of malaise and muscle aches- mostly involving her neck and back region. These have progressed over several days. She's also reported low-grade fever. She had recent dysuria but no clear evidence for UTI by culture. Rule out PMR. Check sedimentation rate. If elevated, consider trial of low-dose  prednisone

## 2013-09-01 ENCOUNTER — Other Ambulatory Visit: Payer: Self-pay

## 2013-09-01 MED ORDER — PREDNISONE 10 MG PO TABS: ORAL_TABLET | ORAL | Status: DC

## 2013-09-03 ENCOUNTER — Other Ambulatory Visit: Payer: Self-pay

## 2013-09-04 ENCOUNTER — Ambulatory Visit (INDEPENDENT_AMBULATORY_CARE_PROVIDER_SITE_OTHER): Payer: Medicare Other | Admitting: Internal Medicine

## 2013-09-04 ENCOUNTER — Encounter: Payer: Self-pay | Admitting: Family Medicine

## 2013-09-04 ENCOUNTER — Encounter: Payer: Self-pay | Admitting: Internal Medicine

## 2013-09-04 ENCOUNTER — Ambulatory Visit (INDEPENDENT_AMBULATORY_CARE_PROVIDER_SITE_OTHER): Payer: Medicare Other | Admitting: Family Medicine

## 2013-09-04 VITALS — BP 146/82 | HR 64 | Ht 63.0 in | Wt 141.0 lb

## 2013-09-04 VITALS — BP 130/72 | HR 70 | Temp 98.2°F | Wt 144.0 lb

## 2013-09-04 DIAGNOSIS — J90 Pleural effusion, not elsewhere classified: Secondary | ICD-10-CM | POA: Diagnosis not present

## 2013-09-04 DIAGNOSIS — I319 Disease of pericardium, unspecified: Secondary | ICD-10-CM

## 2013-09-04 DIAGNOSIS — I313 Pericardial effusion (noninflammatory): Secondary | ICD-10-CM

## 2013-09-04 DIAGNOSIS — I442 Atrioventricular block, complete: Secondary | ICD-10-CM

## 2013-09-04 DIAGNOSIS — I3139 Other pericardial effusion (noninflammatory): Secondary | ICD-10-CM

## 2013-09-04 DIAGNOSIS — M353 Polymyalgia rheumatica: Secondary | ICD-10-CM

## 2013-09-04 DIAGNOSIS — I314 Cardiac tamponade: Secondary | ICD-10-CM

## 2013-09-04 LAB — MDC_IDC_ENUM_SESS_TYPE_INCLINIC
Battery Impedance: 100 Ohm
Battery Remaining Longevity: 145 mo
Battery Voltage: 2.8 V
Brady Statistic AP VP Percent: 1 %
Brady Statistic AP VS Percent: 0 %
Brady Statistic AS VP Percent: 99 %
Brady Statistic AS VS Percent: 0 %
Date Time Interrogation Session: 20150430114051
Lead Channel Impedance Value: 419 Ohm
Lead Channel Impedance Value: 514 Ohm
Lead Channel Pacing Threshold Amplitude: 0.5 V
Lead Channel Pacing Threshold Amplitude: 1 V
Lead Channel Pacing Threshold Pulse Width: 0.4 ms
Lead Channel Pacing Threshold Pulse Width: 0.4 ms
Lead Channel Sensing Intrinsic Amplitude: 2.8 mV
Lead Channel Setting Pacing Amplitude: 2 V
Lead Channel Setting Pacing Amplitude: 2.5 V
Lead Channel Setting Pacing Pulse Width: 0.4 ms
Lead Channel Setting Sensing Sensitivity: 8 mV

## 2013-09-04 NOTE — Patient Instructions (Addendum)
Your physician recommends that you schedule a follow-up appointment in: 3 months with Dr Allred   Your physician has requested that you have an echocardiogram. Echocardiography is a painless test that uses sound waves to create images of your heart. It provides your doctor with information about the size and shape of your heart and how well your heart's chambers and valves are working. This procedure takes approximately one hour. There are no restrictions for this procedure.      

## 2013-09-04 NOTE — Patient Instructions (Addendum)
Reduce prednisone to 15 mg (one and one half tablets) daily until follow up. Continue with daily calcium and Vit D supplement.   Polymyalgia Rheumatica Polymyalgia rheumatica (also called PMR or polymyalgia) is a rheumatologic (arthritic) condition that causes pain and morning stiffness in your neck, shoulders, and hips. It is an inflammatory condition. In some people, inflammation of certain structures in the shoulder, hips, or other joints can be seen on special testing. It does not cause joint destruction, as occurs in other arthritic conditions. It usually occurs after 78 years of age, and is more common as you age. It can be confused with several other diseases, but it is usually easily treated. People with PMR often have, or can develop, a more severe rheumatologic condition called giant cell arteritis (also called CGA or temporal arteritis).  CAUSES  The exact cause of PMR is not known.   There are genetic factors involved.  Viruses have been suspected in the cause of PMR. This has not been proven. SYMPTOMS   Aching, pain, and morning stiffness your neck, both shoulders, or both hips.  Symptoms usually start slowly and build gradually.  Morning stiffness usually lasts at least 30 minutes.  Swelling and tenderness in other joints of the arms, hands, legs, and feet may occur.  Swelling and inflammation in the wrists can cause nerve inflammation at the wrist (carpal tunnel syndrome).  You may also have low grade fever, fatigue, weakness, decreased appetite and weight loss. DIAGNOSIS   Your caregiver may suspect that you have PMR based on your description of your symptoms and on your exam.  Your caregiver will examine you to be sure you do not have diseases that can be confused with PMR. These diseases include rheumatoid arthritis, fibromyalgia, or thyroid disease.  Your caregiver should check for signs of giant cell arteritis. This can cause serious complications such as  blindness.  Lab tests can help confirm that you have PMR and not other diseases, but are sometimes inconclusive.  X-rays cannot show PMR. However, it can identify other diseases like rheumatoid arthritis. Your caregiver may have you see a specialist in arthritis and inflammatory diseases (rheumatologist). TREATMENT  The goal of treatment is relief of symptoms. Treatment does not shorten the course of the illness or prevent complications. With proper treatment, you usually feel better almost right away.   The initial treatment of PMR is usually a cortisone (steroid) medication. Your caregiver will help determine a starting dose. The dose is gradually reduced every few weeks to months. Treatment usually lasts one to three years.  Other stronger medications are rarely needed. They will only be prescribed if your symptoms do not get better on cortisone medication alone, or if they recur as the dose is reduced.  Cortisone medication can have different side effects. With the doses of cortisone needed for PMR, the side effects can affect bones and joints, blood sugar control in diabetes, and mood changes. Discuss this with your caregiver.  Your caregiver will evaluate you regularly during your treatment. They will do this in order to assess progress and to check for complications of the illness or treatment.  Physical therapy is sometimes useful. This is especially true if your joints are still stiff after other symptoms have improved. HOME CARE INSTRUCTIONS   Follow your caregiver's instructions. Do not change your dose of cortisone medication on your own.  Keep your appointments for follow-up lab tests and caregiver visits. Your lab tests need to be monitored. You must get checked  periodically for giant cell arteritis.  Follow your caregiver's guidance regarding physical activity (usually no restrictions are needed) or physical therapy.  Your caregiver may have instructions to prevent or check  for side effects from cortisone medication (including bone density testing or treatment). Follow their instructions carefully. SEEK MEDICAL CARE IF:   You develop any side effects from treatment. Side effects can include:  Elevated blood pressure.  High blood sugar (or worsening of diabetes, if you are diabetic).  Difficulty fighting off infections.  Weight gain.  Weakness of the bones (osteoporosis).  Your aches, pains, morning stiffness, or other symptoms get worse with time. This is especially true after your dose of cortisone is reduced.  You develop new joint symptoms (pain, swelling, etc.) SEEK IMMEDIATE MEDICAL CARE IF:   You develop a severe headache.  You start vomiting.  You have problems with your vision.  You have an oral temperature above 102 F (38.9 C), not controlled by medicine. Document Released: 06/01/2004 Document Revised: 04/10/2012 Document Reviewed: 09/14/2008 [facility] Patient Information 2014 [place], [place].

## 2013-09-04 NOTE — Progress Notes (Signed)
Pre visit review using our clinic review tool, if applicable. No additional management support is needed unless otherwise documented below in the visit note. 

## 2013-09-04 NOTE — Progress Notes (Signed)
Subjective:    Patient ID: Theresa Norton, female    DOB: [birth year], 80 y.o.   MRN: [id number]  HPI Patient is seen for followup regarding myalgias. With suspected possible PMR. She has had several weeks of progressive neck shoulder and hip stiffness and soreness. She even had some low-grade fever with no clear source. We obtain sed rate of 72. We explained there were no other diagnostic labs (for PMR) but in the clinical setting of symptoms above at her age felt like she should try low-dose prednisone. After taking 20 mg, the following day she felt dramatically improved. She is currently on 20 mg daily. She has not had any visual changes or suspicion of temporal arteritis. No headaches. No history of diabetes  Past Medical History  Diagnosis Date  . Allergy   . Hypertension   . Colon polyps   . Heart block AV second degree     a. s/p MDT Addapta dual chamber pacemaker 05/2013 by Dr Rayann Heman.  Marland Kitchen PAF (paroxysmal atrial fibrillation)     a. Found post-pacer 05/2013->Xarelto added;  b. 05/2013 Echo: EF 60-65%, mild LVH, nl wall motion w/o rwma.  . Groin hematoma     a. 05/2013 R groin hematoma post-cath - u/s 05/31/13 large 7.49 cm r inguinal hematoma extending into thigh, no psa or avf.  . Anemia   . CAD (coronary artery disease)     a. 05/2013 nonobs dzs by cath.  . Pacemaker   . H/O hiatal hernia   . Arthritis     "in my fingers" (07/08/2013)  . On home oxygen therapy     "2L maybe 12h/day" (07/08/2013)  . Pacemaker    Past Surgical History  Procedure Laterality Date  . Bunionectomy Bilateral 1967  . Breast cyst excision Right 1959; 1970's  . Insert / replace / remove pacemaker  05/2013    MDT ADDRL1 implanted by Dr Rayann Heman for heart block  . Insert / replace / remove pacemaker  06/2013    Right ventricular lead perforation with tamponade  . Cardiac catheterization    . Cataract extraction w/ intraocular lens  implant, bilateral Bilateral   . Dilation and curettage of uterus    .  Varicose vein surgery Bilateral ~ 2002    reports that she has never smoked. She has never used smokeless tobacco. She reports that she drinks about 8.4 ounces of alcohol per week. She reports that she does not use illicit drugs. family history includes Cancer in her father. Allergies  Allergen Reactions  . Codeine     GI upset  . Morphine And Related     GI upset      Review of Systems  Eyes: Negative for visual disturbance.  Neurological: Negative for headaches.       Objective:   Physical Exam  Constitutional: She appears well-developed and well-nourished.  Cardiovascular: Normal rate and regular rhythm.   Pulmonary/Chest: Effort normal and breath sounds normal. No respiratory distress. She has no wheezes. She has no rales.  Musculoskeletal: She exhibits no edema.          Assessment & Plan:  Diffuse myalgias and fatigue. Suspect polymyalgia rheumatica. She had fairly dramatic response with low-dose prednisone which makes this even more likely. Taper back prednisone 15 mg daily. Recheck in 2 weeks and repeat sedimentation rate then. Will try to taper further at that time and explained typically patients take low dose prednisone for 1-2 years for this. Continue calcium and vitamin D supplementation.  We'll need to monitor blood sugars.

## 2013-09-04 NOTE — Progress Notes (Signed)
PCP: Eulas Post, MD Primary Cardiologist:  Dr Lowell Guitar Theresa Norton is a 80 y.o. female who presents today for electrophysiology followup.   She had prolonged hospitalization with pacemaker lead perforation/ pericardial effusion and pleural effusions.  She has made slow improvement since.  She has had low grade fevers which were attributed to UTI.  She has also had myalgias with elevated sed rate and has plans for steroid therapy by Dr Elease Hashimoto for PMR.  WBC has remained normal  She denies chills.    Today, she denies symptoms of palpitations, chest pain,  lower extremity edema, dizziness, presyncope, or syncope.  The patient is otherwise without complaint today.   Past Medical History  Diagnosis Date  . Allergy   . Hypertension   . Colon polyps   . Heart block AV second degree     a. s/p MDT Addapta dual chamber pacemaker 05/2013 by Dr Rayann Heman.  Marland Kitchen PAF (paroxysmal atrial fibrillation)     a. Found post-pacer 05/2013->Xarelto added;  b. 05/2013 Echo: EF 60-65%, mild LVH, nl wall motion w/o rwma.  . Groin hematoma     a. 05/2013 R groin hematoma post-cath - u/s 05/31/13 large 7.49 cm r inguinal hematoma extending into thigh, no psa or avf.  . Anemia   . CAD (coronary artery disease)     a. 05/2013 nonobs dzs by cath.  . Pacemaker   . H/O hiatal hernia   . Arthritis     "in my fingers" (07/08/2013)  . On home oxygen therapy     "2L maybe 12h/day" (07/08/2013)  . Pacemaker    Past Surgical History  Procedure Laterality Date  . Bunionectomy Bilateral 1967  . Breast cyst excision Right 1959; 1970's  . Insert / replace / remove pacemaker  05/2013    MDT ADDRL1 implanted by Dr Rayann Heman for heart block  . Insert / replace / remove pacemaker  06/2013    Right ventricular lead perforation with tamponade  . Cardiac catheterization    . Cataract extraction w/ intraocular lens  implant, bilateral Bilateral   . Dilation and curettage of uterus    . Varicose vein surgery Bilateral ~ 2002     Current Outpatient Prescriptions  Medication Sig Dispense Refill  . acetaminophen (TYLENOL) 325 MG tablet Take 325 mg by mouth every 6 (six) hours as needed for mild pain.      Marland Kitchen ALPRAZolam (XANAX) 0.25 MG tablet Take 1 tablet (0.25 mg total) by mouth 2 (two) times daily as needed for anxiety.  60 tablet  0  . digoxin (LANOXIN) 0.125 MG tablet Take 1 tablet (0.125 mg total) by mouth daily.  30 tablet  2  . diltiazem (CARDIZEM CD) 180 MG 24 hr capsule Take 1 capsule (180 mg total) by mouth daily.  30 capsule  3  . furosemide (LASIX) 20 MG tablet Take 1 tablet (20 mg total) by mouth every Monday, Wednesday, and Friday.  15 tablet  3  . IRON PO Take 1 tablet by mouth daily.       . metoprolol (LOPRESSOR) 50 MG tablet Take 0.5 tablets (25 mg total) by mouth 2 (two) times daily.  60 tablet  3  . potassium chloride (K-DUR) 10 MEQ tablet Take 1 tablet (10 mEq total) by mouth daily.  30 tablet  1  . predniSONE (DELTASONE) 10 MG tablet Take 2 tablets daily (20 mg) for 3 days then one (10 mg) tablet daily.  60 tablet  0   No current  facility-administered medications for this visit.    Physical Exam: Filed Vitals:   09/04/13 1117  BP: 146/82  Pulse: 64  Height: 5\' 3"  (1.6 m)  Weight: 141 lb (63.957 kg)    GEN- The patient is well appearing, alert and oriented x 3 today.   Head- normocephalic, atraumatic Eyes-  Sclera clear, conjunctiva pink Ears- hearing intact Oropharynx- clear Lungs- Clear to ausculation bilaterally, normal work of breathing Chest- pacemaker pocket is well healed Heart- Regular rate and rhythm, no murmurs, rubs or gallops, PMI not laterally displaced GI- soft, NT, ND, + BS Extremities- no clubbing, cyanosis, or edema  Pacemaker interrogation- reviewed in detail today,  See PACEART report  Assessment and Plan:  1. Complete heart block Normal pacemaker function See Pace Art report No changes today  2. Pleural effusions Recent check Xray reveals that these are  makedly improved  3. Pericardial effusion/ tamponade Clinically improved. Given low grade fevers and malaise, will order an echo to evaluate My suspicion for device related infection is low given normal WBC.  I have spoken with Dr Elease Hashimoto this am.  We will follow her clinical response to steroids.  Return in 3 months for follow-up

## 2013-09-09 DIAGNOSIS — I442 Atrioventricular block, complete: Secondary | ICD-10-CM | POA: Diagnosis not present

## 2013-09-09 DIAGNOSIS — D509 Iron deficiency anemia, unspecified: Secondary | ICD-10-CM | POA: Diagnosis not present

## 2013-09-09 DIAGNOSIS — I10 Essential (primary) hypertension: Secondary | ICD-10-CM | POA: Diagnosis not present

## 2013-09-09 DIAGNOSIS — D62 Acute posthemorrhagic anemia: Secondary | ICD-10-CM | POA: Diagnosis not present

## 2013-09-09 DIAGNOSIS — Z48812 Encounter for surgical aftercare following surgery on the circulatory system: Secondary | ICD-10-CM | POA: Diagnosis not present

## 2013-09-09 DIAGNOSIS — Z45018 Encounter for adjustment and management of other part of cardiac pacemaker: Secondary | ICD-10-CM | POA: Diagnosis not present

## 2013-09-17 ENCOUNTER — Encounter: Payer: Self-pay | Admitting: Internal Medicine

## 2013-09-18 ENCOUNTER — Encounter: Payer: Self-pay | Admitting: Family Medicine

## 2013-09-18 ENCOUNTER — Ambulatory Visit (INDEPENDENT_AMBULATORY_CARE_PROVIDER_SITE_OTHER): Payer: Medicare Other | Admitting: Family Medicine

## 2013-09-18 VITALS — BP 134/74 | HR 68 | Wt 143.0 lb

## 2013-09-18 DIAGNOSIS — M353 Polymyalgia rheumatica: Secondary | ICD-10-CM

## 2013-09-18 LAB — SEDIMENTATION RATE: Sed Rate: 13 mm/hr (ref 0–22)

## 2013-09-18 NOTE — Patient Instructions (Signed)
Remember to fast before your next appointment. Water or black coffee are okay.  Polymyalgia Rheumatica Polymyalgia rheumatica (also called PMR or polymyalgia) is a rheumatologic (arthritic) condition that causes pain and morning stiffness in your neck, shoulders, and hips. It is an inflammatory condition. In some people, inflammation of certain structures in the shoulder, hips, or other joints can be seen on special testing. It does not cause joint destruction, as occurs in other arthritic conditions. It usually occurs after 78 years of age, and is more common as you age. It can be confused with several other diseases, but it is usually easily treated. People with PMR often have, or can develop, a more severe rheumatologic condition called giant cell arteritis (also called CGA or temporal arteritis).  CAUSES  The exact cause of PMR is not known.   There are genetic factors involved.  Viruses have been suspected in the cause of PMR. This has not been proven. SYMPTOMS   Aching, pain, and morning stiffness your neck, both shoulders, or both hips.  Symptoms usually start slowly and build gradually.  Morning stiffness usually lasts at least 30 minutes.  Swelling and tenderness in other joints of the arms, hands, legs, and feet may occur.  Swelling and inflammation in the wrists can cause nerve inflammation at the wrist (carpal tunnel syndrome).  You may also have low grade fever, fatigue, weakness, decreased appetite and weight loss. DIAGNOSIS   Your caregiver may suspect that you have PMR based on your description of your symptoms and on your exam.  Your caregiver will examine you to be sure you do not have diseases that can be confused with PMR. These diseases include rheumatoid arthritis, fibromyalgia, or thyroid disease.  Your caregiver should check for signs of giant cell arteritis. This can cause serious complications such as blindness.  Lab tests can help confirm that you have PMR  and not other diseases, but are sometimes inconclusive.  X-rays cannot show PMR. However, it can identify other diseases like rheumatoid arthritis. Your caregiver may have you see a specialist in arthritis and inflammatory diseases (rheumatologist). TREATMENT  The goal of treatment is relief of symptoms. Treatment does not shorten the course of the illness or prevent complications. With proper treatment, you usually feel better almost right away.   The initial treatment of PMR is usually a cortisone (steroid) medication. Your caregiver will help determine a starting dose. The dose is gradually reduced every few weeks to months. Treatment usually lasts one to three years.  Other stronger medications are rarely needed. They will only be prescribed if your symptoms do not get better on cortisone medication alone, or if they recur as the dose is reduced.  Cortisone medication can have different side effects. With the doses of cortisone needed for PMR, the side effects can affect bones and joints, blood sugar control in diabetes, and mood changes. Discuss this with your caregiver.  Your caregiver will evaluate you regularly during your treatment. They will do this in order to assess progress and to check for complications of the illness or treatment.  Physical therapy is sometimes useful. This is especially true if your joints are still stiff after other symptoms have improved. HOME CARE INSTRUCTIONS   Follow your caregiver's instructions. Do not change your dose of cortisone medication on your own.  Keep your appointments for follow-up lab tests and caregiver visits. Your lab tests need to be monitored. You must get checked periodically for giant cell arteritis.  Follow your caregiver's guidance  regarding physical activity (usually no restrictions are needed) or physical therapy.  Your caregiver may have instructions to prevent or check for side effects from cortisone medication (including bone  density testing or treatment). Follow their instructions carefully. SEEK MEDICAL CARE IF:   You develop any side effects from treatment. Side effects can include:  Elevated blood pressure.  High blood sugar (or worsening of diabetes, if you are diabetic).  Difficulty fighting off infections.  Weight gain.  Weakness of the bones (osteoporosis).  Your aches, pains, morning stiffness, or other symptoms get worse with time. This is especially true after your dose of cortisone is reduced.  You develop new joint symptoms (pain, swelling, etc.) SEEK IMMEDIATE MEDICAL CARE IF:   You develop a severe headache.  You start vomiting.  You have problems with your vision.  You have an oral temperature above 102 F (38.9 C), not controlled by medicine. Document Released: 06/01/2004 Document Revised: 04/10/2012 Document Reviewed: 09/14/2008 [facility] Patient Information 2014 [place], [place].

## 2013-09-18 NOTE — Progress Notes (Signed)
Pre visit review using our clinic review tool, if applicable. No additional management support is needed unless otherwise documented below in the visit note. 

## 2013-09-18 NOTE — Progress Notes (Signed)
   Subjective:    Patient ID: Theresa Norton, female    DOB: 1929/03/29, 80 y.o.   MRN: [id number]  HPI Comments: Patient is an 80 year old female presenting for 2 week medication follow-up appointment. She began taking prednisone for polymyalgia rheumatica. She has seen much improvement of symptoms. Patient does not have any complaints at this time.   Past Medical History  Diagnosis Date  . Allergy   . Hypertension   . Colon polyps   . Heart block AV second degree     a. s/p MDT Addapta dual chamber pacemaker 05/2013 by Dr Rayann Heman.  Marland Kitchen PAF (paroxysmal atrial fibrillation)     a. Found post-pacer 05/2013->Xarelto added;  b. 05/2013 Echo: EF 60-65%, mild LVH, nl wall motion w/o rwma.  . Groin hematoma     a. 05/2013 R groin hematoma post-cath - u/s 05/31/13 large 7.49 cm r inguinal hematoma extending into thigh, no psa or avf.  . Anemia   . CAD (coronary artery disease)     a. 05/2013 nonobs dzs by cath.  . Pacemaker   . H/O hiatal hernia   . Arthritis     "in my fingers" (07/08/2013)  . On home oxygen therapy     "2L maybe 12h/day" (07/08/2013)  . Pacemaker    Past Surgical History  Procedure Laterality Date  . Bunionectomy Bilateral 1967  . Breast cyst excision Right 1959; 1970's  . Insert / replace / remove pacemaker  05/2013    MDT ADDRL1 implanted by Dr Rayann Heman for heart block  . Insert / replace / remove pacemaker  06/2013    Right ventricular lead perforation with tamponade  . Cardiac catheterization    . Cataract extraction w/ intraocular lens  implant, bilateral Bilateral   . Dilation and curettage of uterus    . Varicose vein surgery Bilateral ~ 2002    Review of Systems  Constitutional: Negative for fever and chills.  Respiratory: Negative for cough and shortness of breath.   Cardiovascular: Negative for chest pain and palpitations.  Gastrointestinal: Negative for abdominal pain.  Neurological: Negative for dizziness.       Objective:   Physical Exam    Constitutional: She appears well-developed and well-nourished.  HENT:  Head: Normocephalic.  Cardiovascular: Normal rate, normal heart sounds and intact distal pulses.   Pulmonary/Chest: Effort normal and breath sounds normal.  Neurological: She is alert.  Skin: Skin is warm and dry.      Assessment & Plan:  1. Polymyalgia Rheumatica Repeat sedimentation rate today. Attempt to taper prednisone dose based on sed rate results. Monitor blood sugars. Continue to supplement vitamin D and calcium.   [place] Danne Vasek, PA-S  Pt has had dramatic improvement clinically .  Agree with above. Currently on prednisone 15  Mg daily and will try to taper back to 10 mg daily.  Carolann Littler, MD

## 2013-09-19 ENCOUNTER — Ambulatory Visit (HOSPITAL_COMMUNITY): Payer: Medicare Other | Attending: Cardiology | Admitting: Radiology

## 2013-09-19 DIAGNOSIS — I2510 Atherosclerotic heart disease of native coronary artery without angina pectoris: Secondary | ICD-10-CM | POA: Diagnosis not present

## 2013-09-19 DIAGNOSIS — I319 Disease of pericardium, unspecified: Secondary | ICD-10-CM | POA: Insufficient documentation

## 2013-09-19 DIAGNOSIS — J90 Pleural effusion, not elsewhere classified: Secondary | ICD-10-CM

## 2013-09-19 DIAGNOSIS — I314 Cardiac tamponade: Secondary | ICD-10-CM

## 2013-09-19 DIAGNOSIS — I313 Pericardial effusion (noninflammatory): Secondary | ICD-10-CM

## 2013-09-19 DIAGNOSIS — I3139 Other pericardial effusion (noninflammatory): Secondary | ICD-10-CM

## 2013-09-19 NOTE — Progress Notes (Signed)
Echocardiogram performed.  

## 2013-10-06 NOTE — Progress Notes (Signed)
This encounter was created in error - please disregard.

## 2013-10-15 ENCOUNTER — Ambulatory Visit (INDEPENDENT_AMBULATORY_CARE_PROVIDER_SITE_OTHER): Payer: Medicare Other | Admitting: Family Medicine

## 2013-10-15 ENCOUNTER — Encounter: Payer: Self-pay | Admitting: Family Medicine

## 2013-10-15 VITALS — BP 134/80 | HR 71 | Temp 98.5°F | Wt 144.0 lb

## 2013-10-15 DIAGNOSIS — Z7952 Long term (current) use of systemic steroids: Secondary | ICD-10-CM

## 2013-10-15 DIAGNOSIS — IMO0002 Reserved for concepts with insufficient information to code with codable children: Secondary | ICD-10-CM

## 2013-10-15 LAB — GLUCOSE, POCT (MANUAL RESULT ENTRY): POC Glucose: 90 mg/dl (ref 70–99)

## 2013-10-15 NOTE — Patient Instructions (Signed)
Polymyalgia Rheumatica Polymyalgia rheumatica (also called PMR or polymyalgia) is a rheumatologic (arthritic) condition that causes pain and morning stiffness in your neck, shoulders, and hips. It is an inflammatory condition. In some people, inflammation of certain structures in the shoulder, hips, or other joints can be seen on special testing. It does not cause joint destruction, as occurs in other arthritic conditions. It usually occurs after 78 years of age, and is more common as you age. It can be confused with several other diseases, but it is usually easily treated. People with PMR often have, or can develop, a more severe rheumatologic condition called giant cell arteritis (also called CGA or temporal arteritis).  CAUSES  The exact cause of PMR is not known.   There are genetic factors involved.  Viruses have been suspected in the cause of PMR. This has not been proven. SYMPTOMS   Aching, pain, and morning stiffness your neck, both shoulders, or both hips.  Symptoms usually start slowly and build gradually.  Morning stiffness usually lasts at least 30 minutes.  Swelling and tenderness in other joints of the arms, hands, legs, and feet may occur.  Swelling and inflammation in the wrists can cause nerve inflammation at the wrist (carpal tunnel syndrome).  You may also have low grade fever, fatigue, weakness, decreased appetite and weight loss. DIAGNOSIS   Your caregiver may suspect that you have PMR based on your description of your symptoms and on your exam.  Your caregiver will examine you to be sure you do not have diseases that can be confused with PMR. These diseases include rheumatoid arthritis, fibromyalgia, or thyroid disease.  Your caregiver should check for signs of giant cell arteritis. This can cause serious complications such as blindness.  Lab tests can help confirm that you have PMR and not other diseases, but are sometimes inconclusive.  X-rays cannot show PMR.  However, it can identify other diseases like rheumatoid arthritis. Your caregiver may have you see a specialist in arthritis and inflammatory diseases (rheumatologist). TREATMENT  The goal of treatment is relief of symptoms. Treatment does not shorten the course of the illness or prevent complications. With proper treatment, you usually feel better almost right away.   The initial treatment of PMR is usually a cortisone (steroid) medication. Your caregiver will help determine a starting dose. The dose is gradually reduced every few weeks to months. Treatment usually lasts one to three years.  Other stronger medications are rarely needed. They will only be prescribed if your symptoms do not get better on cortisone medication alone, or if they recur as the dose is reduced.  Cortisone medication can have different side effects. With the doses of cortisone needed for PMR, the side effects can affect bones and joints, blood sugar control in diabetes, and mood changes. Discuss this with your caregiver.  Your caregiver will evaluate you regularly during your treatment. They will do this in order to assess progress and to check for complications of the illness or treatment.  Physical therapy is sometimes useful. This is especially true if your joints are still stiff after other symptoms have improved. HOME CARE INSTRUCTIONS   Follow your caregiver's instructions. Do not change your dose of cortisone medication on your own.  Keep your appointments for follow-up lab tests and caregiver visits. Your lab tests need to be monitored. You must get checked periodically for giant cell arteritis.  Follow your caregiver's guidance regarding physical activity (usually no restrictions are needed) or physical therapy.  Your caregiver  may have instructions to prevent or check for side effects from cortisone medication (including bone density testing or treatment). Follow their instructions carefully. SEEK MEDICAL  CARE IF:   You develop any side effects from treatment. Side effects can include:  Elevated blood pressure.  High blood sugar (or worsening of diabetes, if you are diabetic).  Difficulty fighting off infections.  Weight gain.  Weakness of the bones (osteoporosis).  Your aches, pains, morning stiffness, or other symptoms get worse with time. This is especially true after your dose of cortisone is reduced.  You develop new joint symptoms (pain, swelling, etc.) SEEK IMMEDIATE MEDICAL CARE IF:   You develop a severe headache.  You start vomiting.  You have problems with your vision.  You have an oral temperature above 102 F (38.9 C), not controlled by medicine. Document Released: 06/01/2004 Document Revised: 04/10/2012 Document Reviewed: 09/14/2008 [facility] Patient Information 2014 [place], [place].

## 2013-10-15 NOTE — Progress Notes (Signed)
   Subjective:    Patient ID: Theresa Norton, female    DOB: 1928/09/04, 80 y.o.   MRN: [id number]  HPI Followup polymyalgia rheumatica. Patient had significant body aches mostly involving her trunk/shoulders, neck. Sedimentation rate 72. She was placed on low-dose prednisone had dramatic almost total resolution of symptoms. She continues to feel well on 10 mg daily. Sedimentation rate went from 72 to 13. She's not any recent dyspnea, chest pains, peripheral edema issues. Takes regular calcium and vitamin D supplementation. No visual difficulties  Past Medical History  Diagnosis Date  . Allergy   . Hypertension   . Colon polyps   . Heart block AV second degree     a. s/p MDT Addapta dual chamber pacemaker 05/2013 by Dr Rayann Heman.  Marland Kitchen PAF (paroxysmal atrial fibrillation)     a. Found post-pacer 05/2013->Xarelto added;  b. 05/2013 Echo: EF 60-65%, mild LVH, nl wall motion w/o rwma.  . Groin hematoma     a. 05/2013 R groin hematoma post-cath - u/s 05/31/13 large 7.49 cm r inguinal hematoma extending into thigh, no psa or avf.  . Anemia   . CAD (coronary artery disease)     a. 05/2013 nonobs dzs by cath.  . Pacemaker   . H/O hiatal hernia   . Arthritis     "in my fingers" (07/08/2013)  . On home oxygen therapy     "2L maybe 12h/day" (07/08/2013)  . Pacemaker    Past Surgical History  Procedure Laterality Date  . Bunionectomy Bilateral 1967  . Breast cyst excision Right 1959; 1970's  . Insert / replace / remove pacemaker  05/2013    MDT ADDRL1 implanted by Dr Rayann Heman for heart block  . Insert / replace / remove pacemaker  06/2013    Right ventricular lead perforation with tamponade  . Cardiac catheterization    . Cataract extraction w/ intraocular lens  implant, bilateral Bilateral   . Dilation and curettage of uterus    . Varicose vein surgery Bilateral ~ 2002    reports that she has never smoked. She has never used smokeless tobacco. She reports that she drinks about 8.4 ounces of alcohol per  week. She reports that she does not use illicit drugs. family history includes Cancer in her father. Allergies  Allergen Reactions  . Codeine     GI upset  . Morphine And Related     GI upset      Review of Systems  Constitutional: Negative for fever, chills, appetite change and unexpected weight change.  Respiratory: Negative for shortness of breath.   Cardiovascular: Negative for chest pain.  Musculoskeletal: Negative for arthralgias, back pain and myalgias.       Objective:   Physical Exam  Constitutional: She appears well-developed and well-nourished.  Neck: Neck supple. No thyromegaly present.  Cardiovascular: Normal rate and regular rhythm.   Pulmonary/Chest: Effort normal and breath sounds normal. No respiratory distress. She has no wheezes. She has no rales.  Musculoskeletal: She exhibits no edema.          Assessment & Plan:  Polymyalgia rheumatica. Stable on low-dose prednisone 10 mg daily. We reiterated adequate calcium and vitamin D consumption. Fasting blood sugar today 99 which is stable. Reassess in 6 months. Consider very slow taper of prednisone at time if symptomatically stable

## 2013-10-15 NOTE — Progress Notes (Signed)
Pre visit review using our clinic review tool, if applicable. No additional management support is needed unless otherwise documented below in the visit note. 

## 2013-10-21 ENCOUNTER — Other Ambulatory Visit: Payer: Self-pay | Admitting: Family Medicine

## 2013-10-21 ENCOUNTER — Ambulatory Visit: Payer: Medicare Other | Admitting: Family Medicine

## 2013-10-28 DIAGNOSIS — I10 Essential (primary) hypertension: Secondary | ICD-10-CM | POA: Diagnosis not present

## 2013-10-28 DIAGNOSIS — Z950 Presence of cardiac pacemaker: Secondary | ICD-10-CM | POA: Diagnosis not present

## 2013-10-28 DIAGNOSIS — I2510 Atherosclerotic heart disease of native coronary artery without angina pectoris: Secondary | ICD-10-CM | POA: Diagnosis not present

## 2013-10-28 DIAGNOSIS — I442 Atrioventricular block, complete: Secondary | ICD-10-CM | POA: Diagnosis not present

## 2013-10-30 DIAGNOSIS — H353190 Nonexudative age-related macular degeneration, unspecified eye, stage unspecified: Secondary | ICD-10-CM | POA: Diagnosis not present

## 2013-10-30 DIAGNOSIS — Z961 Presence of intraocular lens: Secondary | ICD-10-CM | POA: Diagnosis not present

## 2013-10-30 DIAGNOSIS — H353290 Exudative age-related macular degeneration, unspecified eye, stage unspecified: Secondary | ICD-10-CM | POA: Diagnosis not present

## 2013-12-04 ENCOUNTER — Ambulatory Visit (INDEPENDENT_AMBULATORY_CARE_PROVIDER_SITE_OTHER): Payer: Medicare Other | Admitting: *Deleted

## 2013-12-04 DIAGNOSIS — I442 Atrioventricular block, complete: Secondary | ICD-10-CM

## 2013-12-04 LAB — MDC_IDC_ENUM_SESS_TYPE_REMOTE
Battery Impedance: 100 Ohm
Battery Remaining Longevity: 143 mo
Battery Voltage: 2.8 V
Brady Statistic AP VP Percent: 7 %
Brady Statistic AP VS Percent: 0 %
Brady Statistic AS VP Percent: 93 %
Brady Statistic AS VS Percent: 0 %
Date Time Interrogation Session: 20150730145019
Lead Channel Impedance Value: 489 Ohm
Lead Channel Impedance Value: 519 Ohm
Lead Channel Pacing Threshold Amplitude: 0.5 V
Lead Channel Pacing Threshold Amplitude: 1 V
Lead Channel Pacing Threshold Pulse Width: 0.4 ms
Lead Channel Pacing Threshold Pulse Width: 0.4 ms
Lead Channel Sensing Intrinsic Amplitude: 1.4 mV
Lead Channel Setting Pacing Amplitude: 2 V
Lead Channel Setting Pacing Amplitude: 2.5 V
Lead Channel Setting Pacing Pulse Width: 0.4 ms
Lead Channel Setting Sensing Sensitivity: 8 mV

## 2013-12-18 ENCOUNTER — Encounter: Payer: Self-pay | Admitting: Cardiology

## 2013-12-22 ENCOUNTER — Encounter: Payer: Self-pay | Admitting: Cardiology

## 2013-12-25 ENCOUNTER — Encounter: Payer: Self-pay | Admitting: Internal Medicine

## 2014-01-01 DIAGNOSIS — L820 Inflamed seborrheic keratosis: Secondary | ICD-10-CM | POA: Diagnosis not present

## 2014-01-01 DIAGNOSIS — L259 Unspecified contact dermatitis, unspecified cause: Secondary | ICD-10-CM | POA: Diagnosis not present

## 2014-01-13 DIAGNOSIS — I10 Essential (primary) hypertension: Secondary | ICD-10-CM | POA: Diagnosis not present

## 2014-01-13 DIAGNOSIS — I442 Atrioventricular block, complete: Secondary | ICD-10-CM | POA: Diagnosis not present

## 2014-01-13 DIAGNOSIS — I2510 Atherosclerotic heart disease of native coronary artery without angina pectoris: Secondary | ICD-10-CM | POA: Diagnosis not present

## 2014-01-13 DIAGNOSIS — Z950 Presence of cardiac pacemaker: Secondary | ICD-10-CM | POA: Diagnosis not present

## 2014-02-15 ENCOUNTER — Other Ambulatory Visit (HOSPITAL_COMMUNITY): Payer: Self-pay | Admitting: Nurse Practitioner

## 2014-02-18 ENCOUNTER — Encounter: Payer: Self-pay | Admitting: Family Medicine

## 2014-02-18 ENCOUNTER — Telehealth: Payer: Self-pay | Admitting: Family Medicine

## 2014-02-18 ENCOUNTER — Ambulatory Visit (INDEPENDENT_AMBULATORY_CARE_PROVIDER_SITE_OTHER): Payer: Medicare Other | Admitting: Family Medicine

## 2014-02-18 VITALS — BP 160/88 | HR 64 | Temp 98.2°F | Wt 157.0 lb

## 2014-02-18 DIAGNOSIS — R5383 Other fatigue: Secondary | ICD-10-CM

## 2014-02-18 DIAGNOSIS — R0602 Shortness of breath: Secondary | ICD-10-CM | POA: Diagnosis not present

## 2014-02-18 DIAGNOSIS — R609 Edema, unspecified: Secondary | ICD-10-CM | POA: Diagnosis not present

## 2014-02-18 NOTE — Progress Notes (Signed)
Garret Reddish, MD Phone: [phone]  Subjective:   Theresa Norton is a 80 y.o. year old very pleasant female patient who presents with the following:  Edema Patient states over the last 2 weeks she has noted intermittent swelling in her legs and has taken lasix perhaps 2-3x as needed. Over last 2 days, the swelling has come and will not go away. She took a lasix this morning as well as this afternoon. She did not take lasix yesterday. She has noted fatigue and dyspnea on exertion. She normally walks 1 mile a day and has only been able to walk 1/4 mile. Her weight is up 13 lbs since last visit but she is not sure if this is related to prednisone. She feels slightly puffy all over including hands and face but denies new medicines and specifically no NSAIDs.  ROS- no chest pain.   Past Medical History- Patient Active Problem List   Diagnosis Date Noted  . PMR (polymyalgia rheumatica) 09/04/2013  . Pleural effusion 07/07/2013  . Anemia, iron deficiency 06/26/2013  . Hyponatremia 06/26/2013  . Displacement of pacemaker electrode lead 06/13/2013  . Pericardial effusion with cardiac tamponade 06/11/2013  . AF (atrial fibrillation) 06/05/2013  . Complete heart block 05/26/2013  . Obesity (BMI 30-39.9) 04/22/2013  . Wenckebach second degree AV block 04/23/2012  . Hypertension 04/17/2012  . Elevated blood pressure 04/10/2012  . History of colon polyps 04/10/2012   Medications- reviewed and updated Current Outpatient Prescriptions  Medication Sig Dispense Refill  . ALPRAZolam (XANAX) 0.25 MG tablet Take 1 tablet (0.25 mg total) by mouth 2 (two) times daily as needed for anxiety.  60 tablet  0  . digoxin (LANOXIN) 0.125 MG tablet Take 1 tablet (0.125 mg total) by mouth daily.  30 tablet  2  . diltiazem (CARDIZEM CD) 180 MG 24 hr capsule Take 1 capsule (180 mg total) by mouth daily.  30 capsule  3  . furosemide (LASIX) 20 MG tablet Take 1 tablet (20 mg total) by mouth every Monday,  Wednesday, and Friday.  15 tablet  3  . IRON PO Take 1 tablet by mouth daily.       . metoprolol (LOPRESSOR) 50 MG tablet TAKE 1 TABLET BY MOUTH TWICE DAILY  60 tablet  1  . potassium chloride (K-DUR) 10 MEQ tablet Take 1 tablet (10 mEq total) by mouth daily.  30 tablet  1  . predniSONE (DELTASONE) 10 MG tablet TAKE 2 TABLETS BY MOUTH DAILY FOR 3 DAYS, THEN TAKE 1 TABLET DAILY AS DIRECTED  60 tablet  3  . acetaminophen (TYLENOL) 325 MG tablet Take 325 mg by mouth every 6 (six) hours as needed for mild pain.       No current facility-administered medications for this visit.    Objective: BP 160/88  Pulse 64  Temp(Src) 98.2 F (36.8 C)  Wt 157 lb (71.215 kg) Gen: NAD, resting comfortably CV: RRR no murmurs rubs or gallops, no hepatojugular reflux Lungs: CTAB no crackles, wheeze, rhonchi Abdomen: soft/nontender/nondistended/normal bowel sounds.  Ext: 1+ edema bilaterally (R >L chronically)  Assessment/Plan:  Edema Shortness of Breath/fatigue Known diastolic dysfunction and patient prescribed 3x weekly lasix but only taking intermittently. Her weight is up 13 lbs since June associated with swelling and dyspnea on exertion as she normally walks a mile per day now walking 1/4 mile max. Suspect this is due to diastolic dysfunction/fluid overload. Have instructed patient to take lasix BID tomorrow and then daily through early next week. She is  to see Dr. Elease Hashimoto early next week (discussed case with him this afternoon). Return precautions advised for earlier return.

## 2014-02-18 NOTE — Telephone Encounter (Signed)
Pt to see Dr. Yong Channel.

## 2014-02-18 NOTE — Telephone Encounter (Signed)
Patient Information:  Caller Name: Sareen  Phone: [phone]  Patient: Theresa Norton  Gender: Female  DOB: 04-23-1929  Age: 80 Years  PCP: Carolann Littler (Family Practice)  Office Follow Up:  Does the office need to follow up with this patient?: No  Instructions For The Office: N/A  RN Note:  Patient is calling regarding increase in swelling and blood pressure and fatigue.   B/P 150/90.  Swelling noted in feet ankles and chin.  Patient states she is 10 minutes from office.  Advised to go to office NOW.  Symptoms  Reason For Call & Symptoms: bilateral leg and ankle swelling  Reviewed Health History In EMR: Yes  Reviewed Medications In EMR: Yes  Reviewed Allergies In EMR: Yes  Reviewed Surgeries / Procedures: Yes  Date of Onset of Symptoms: 02/16/2014  Guideline(s) Used:  Leg Swelling and Edema  Disposition Per Guideline:   Go to Office Now  Reason For Disposition Reached:   Swelling of face, arm or hands (Exception: slight puffiness of fingers during hot weather)  Advice Given:  Call Back If:  You become worse.  Patient Will Follow Care Advice:  YES  Appointment Scheduled:  02/18/2014 16:00:56 Appointment Scheduled Provider:  Shanon Ace [facility])

## 2014-02-18 NOTE — Patient Instructions (Addendum)
Swelling and blood pressure elevation.  I think you have too much fluid in your body.   Take lasix twice a day tomorrow  Take daily Friday, Sat, Sun, Monday, Tuesday  See [place] early next week (schedule with Dr. Elease Hashimoto)  Check your weight daily. If your weight continues to increase, see [place] sooner. If you have worsening shortness of breath or fatigue, see [place] sooner or call for advice if after hours.

## 2014-02-23 ENCOUNTER — Ambulatory Visit (INDEPENDENT_AMBULATORY_CARE_PROVIDER_SITE_OTHER): Payer: Medicare Other | Admitting: Family Medicine

## 2014-02-23 ENCOUNTER — Encounter: Payer: Self-pay | Admitting: Family Medicine

## 2014-02-23 VITALS — BP 130/72 | HR 76 | Temp 97.4°F | Wt 155.0 lb

## 2014-02-23 DIAGNOSIS — R600 Localized edema: Secondary | ICD-10-CM | POA: Diagnosis not present

## 2014-02-23 DIAGNOSIS — M353 Polymyalgia rheumatica: Secondary | ICD-10-CM | POA: Diagnosis not present

## 2014-02-23 DIAGNOSIS — R300 Dysuria: Secondary | ICD-10-CM | POA: Diagnosis not present

## 2014-02-23 LAB — POCT URINALYSIS DIPSTICK
Bilirubin, UA: NEGATIVE
Blood, UA: NEGATIVE
Glucose, UA: NEGATIVE
Nitrite, UA: NEGATIVE
Protein, UA: NEGATIVE
Spec Grav, UA: 1.015
Urobilinogen, UA: 0.2
pH, UA: 6.5

## 2014-02-23 MED ORDER — CEPHALEXIN 500 MG PO CAPS: 500.0000 mg | ORAL_CAPSULE | Freq: Three times a day (TID) | ORAL | Status: DC

## 2014-02-23 NOTE — Progress Notes (Signed)
Subjective:    Patient ID: Theresa Norton, female    DOB: 09/07/1928, 80 y.o.   MRN: [id number]  Dysuria  Associated symptoms include frequency. Pertinent negatives include no chills.    seen for the following issues:   Dysuria past couple days. She's had some urine frequency and burning with urination. Denies any fever or chills. She's had some increase fatigue issues.  Recent increased bilateral leg edema. She was seen last week with some lower extremity edema and weight had increased 157 pounds. Her daughter states that some of this increase was related to previous weight loss with illness last winter (and recent improvement in appetite). Nevertheless, her Lasix was increased to once daily. Her ankle and leg edema have improved somewhat. She denies any orthopnea.  She has history of probable PMR. She had high sed rate, fatigue, and neck and upper back and trunk severe myalgias which were highly suggestive and she had dramatic response to low-dose prednisone. Patient tapered herself from 10 mg to 5 mg about a week ago has had increased fatigue since then. We explained the dangers of rapid tapering without medical guidance.  Past Medical History  Diagnosis Date  . Allergy   . Hypertension   . Colon polyps   . Heart block AV second degree     a. s/p MDT Addapta dual chamber pacemaker 05/2013 by Dr Rayann Heman.  Marland Kitchen PAF (paroxysmal atrial fibrillation)     a. Found post-pacer 05/2013->Xarelto added;  b. 05/2013 Echo: EF 60-65%, mild LVH, nl wall motion w/o rwma.  . Groin hematoma     a. 05/2013 R groin hematoma post-cath - u/s 05/31/13 large 7.49 cm r inguinal hematoma extending into thigh, no psa or avf.  . Anemia   . CAD (coronary artery disease)     a. 05/2013 nonobs dzs by cath.  . Pacemaker   . H/O hiatal hernia   . Arthritis     "in my fingers" (07/08/2013)  . On home oxygen therapy     "2L maybe 12h/day" (07/08/2013)  . Pacemaker    Past Surgical History  Procedure Laterality Date    . Bunionectomy Bilateral 1967  . Breast cyst excision Right 1959; 1970's  . Insert / replace / remove pacemaker  05/2013    MDT ADDRL1 implanted by Dr Rayann Heman for heart block  . Insert / replace / remove pacemaker  06/2013    Right ventricular lead perforation with tamponade  . Cardiac catheterization    . Cataract extraction w/ intraocular lens  implant, bilateral Bilateral   . Dilation and curettage of uterus    . Varicose vein surgery Bilateral ~ 2002    reports that she has never smoked. She has never used smokeless tobacco. She reports that she drinks about 8.4 ounces of alcohol per week. She reports that she does not use illicit drugs. family history includes Cancer in her father. Allergies  Allergen Reactions  . Codeine     GI upset  . Morphine And Related     GI upset  '    Review of Systems  Constitutional: Positive for fatigue. Negative for fever and chills.  Respiratory: Negative for cough and shortness of breath.   Cardiovascular: Positive for leg swelling. Negative for chest pain and palpitations.  Endocrine: Negative for polydipsia and polyuria.  Genitourinary: Positive for dysuria and frequency.  Neurological: Negative for syncope.  Psychiatric/Behavioral: Negative for dysphoric mood.       Objective:   Physical Exam  Constitutional:  She appears well-developed and well-nourished. No distress.  Neck: Neck supple. No thyromegaly present.  Cardiovascular: Normal rate and regular rhythm.   Pulmonary/Chest: Effort normal and breath sounds normal. No respiratory distress. She has no wheezes. She has no rales.  Musculoskeletal:  No pitting edema ankles feet or legs          Assessment & Plan:  #1 dysuria. Suspect uncomplicated cystitis. Urine culture sent. Keflex 500 mg 3 times a day for 7 days. #2 bilateral leg edema. Probably multifactorial related- diastolic dysfunction, prednisone, venous stasis. She has no significant edema today. She will try reducing  her Lasix back to one every Monday Wednesday and Friday. Recheck basic metabolic panel today with recent increased bump in Lasix #3 PMR. Patient has followup in approximately one month. Recheck sedimentation rate then and hopefully taper back prednisone slowly. She'll continue 10 mg daily until followup. We explained that tapering off this will be a slow process and that most folks are not able to taper off any less than 9-12 months for PMR

## 2014-02-23 NOTE — Progress Notes (Signed)
Pre visit review using our clinic review tool, if applicable. No additional management support is needed unless otherwise documented below in the visit note. 

## 2014-02-24 LAB — BASIC METABOLIC PANEL
BUN: 25 mg/dL — ABNORMAL HIGH (ref 6–23)
CO2: 23 mEq/L (ref 19–32)
Calcium: 9.3 mg/dL (ref 8.4–10.5)
Chloride: 100 mEq/L (ref 96–112)
Creatinine, Ser: 1.2 mg/dL (ref 0.4–1.2)
GFR: 44.27 mL/min — ABNORMAL LOW (ref >60.00)
Glucose, Bld: 153 mg/dL — ABNORMAL HIGH (ref 70–99)
Potassium: 5.1 mEq/L (ref 3.5–5.1)
Sodium: 139 mEq/L (ref 135–145)

## 2014-02-25 ENCOUNTER — Ambulatory Visit: Payer: Medicare Other | Admitting: Family Medicine

## 2014-02-25 LAB — URINE CULTURE: Colony Count: >=100000

## 2014-03-05 DIAGNOSIS — H3532 Exudative age-related macular degeneration: Secondary | ICD-10-CM | POA: Diagnosis not present

## 2014-03-05 DIAGNOSIS — Z961 Presence of intraocular lens: Secondary | ICD-10-CM | POA: Diagnosis not present

## 2014-03-05 DIAGNOSIS — H3531 Nonexudative age-related macular degeneration: Secondary | ICD-10-CM | POA: Diagnosis not present

## 2014-03-14 IMAGING — US IR PERC PLEURAL DRAIN W/INDWELL CATH W/IMG GUIDE
1 series · 3 of 3 positions shown · non-contrast
Comparison: none

CLINICAL DATA: SHORTNESS OF BREATH, RECURRENT LEFT PLEURAL EFFUSION

[Series 1: ir perc pleural drain w/indwell cath w/img guide · 3 of 3 slices shown]
[im 1/3]
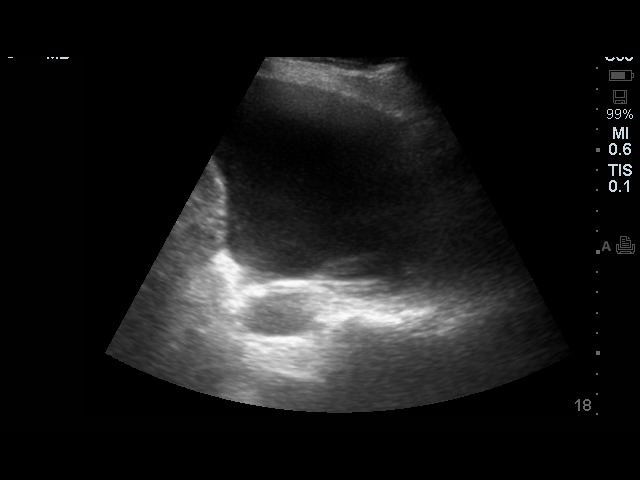
[im 2/3]
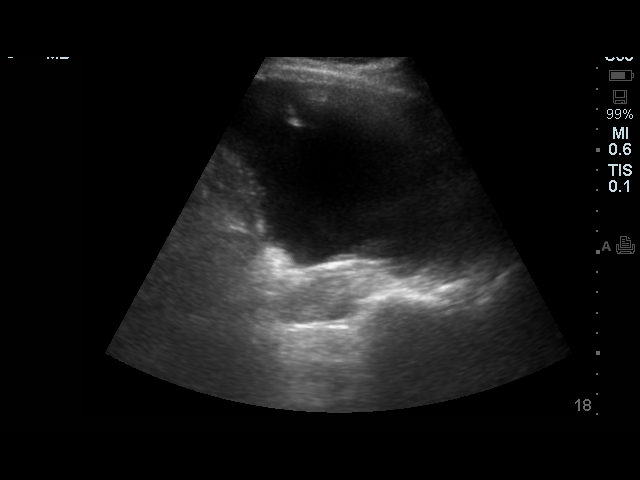
[im 3/3]
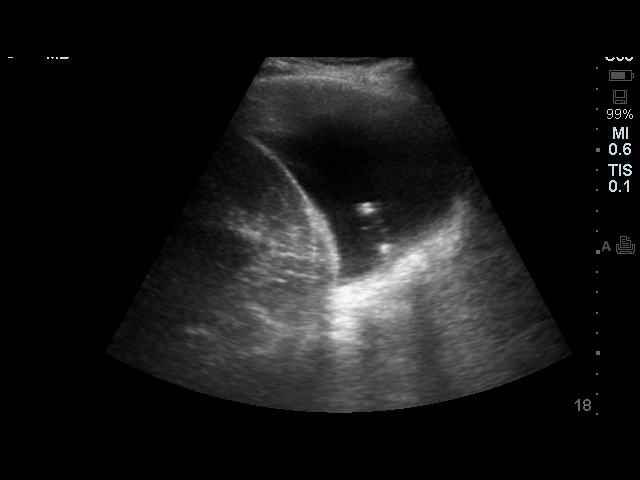

[3 of 3 positions shown; findings below may reference images not displayed]

EXAM:
ULTRASOUND AND FLUOROSCOPIC 10 FRENCH LEFT CHEST TUBE INSERTION

Date:  [DATE] [DATE]

Radiologist:  Spring, Lus

Guidance:  Ultrasound and fluoroscopic

MEDICATIONS AND MEDICAL HISTORY:
2 mg Versed and, 100 mcg fentanyl

ANESTHESIA/SEDATION:
15 min

CONTRAST:  None.

FLUOROSCOPY TIME:  6 seconds

PROCEDURE:
Informed consent was obtained from the patient following explanation
of the procedure, risks, benefits and alternatives. The patient
understands, agrees and consents for the procedure. All questions
were addressed. A time out was performed.

Maximal barrier sterile technique utilized including caps, mask,
sterile gowns, sterile gloves, large sterile drape, hand hygiene,
and lower breath.

Previous imaging reviewed. Patient position the right anterior
oblique. Preliminary ultrasound performed. The large left effusion
was localized. Intercostal space was marked. Under sterile
conditions and local anesthesia, ultrasound percutaneous needle
access was performed of the left chest. Needle position confirmed
within the pleural fluid. Guidewire inserted followed by tract
dilatation to insert a 10 French drain. Drain catheter position
confirmed with ultrasound and fluoroscopy. Images obtained for
documentation. Catheter secured with a Prolene suture and connected
to a Pleur-Evac. Drain secured with a Prolene suture. Sterile
dressing applied. No immediate complication. Patient tolerated the
procedure well.

COMPLICATIONS:
No immediate
IMPRESSION: Successful ultrasound and fluoroscopic 10 French left chest tube
insertion for recurrent large effusion.

PLAN:
Chest tube to low wall suction through Pleur-Evac at 20 cm water

## 2014-04-15 DIAGNOSIS — I2510 Atherosclerotic heart disease of native coronary artery without angina pectoris: Secondary | ICD-10-CM | POA: Diagnosis not present

## 2014-04-15 DIAGNOSIS — I350 Nonrheumatic aortic (valve) stenosis: Secondary | ICD-10-CM | POA: Diagnosis not present

## 2014-04-15 DIAGNOSIS — I361 Nonrheumatic tricuspid (valve) insufficiency: Secondary | ICD-10-CM | POA: Diagnosis not present

## 2014-04-15 DIAGNOSIS — I340 Nonrheumatic mitral (valve) insufficiency: Secondary | ICD-10-CM | POA: Diagnosis not present

## 2014-04-16 ENCOUNTER — Ambulatory Visit (INDEPENDENT_AMBULATORY_CARE_PROVIDER_SITE_OTHER): Payer: Medicare Other | Admitting: Family Medicine

## 2014-04-16 ENCOUNTER — Encounter (HOSPITAL_COMMUNITY): Payer: Self-pay | Admitting: Cardiovascular Disease

## 2014-04-16 VITALS — BP 134/80 | HR 80 | Temp 98.0°F | Wt 160.0 lb

## 2014-04-16 DIAGNOSIS — R600 Localized edema: Secondary | ICD-10-CM | POA: Diagnosis not present

## 2014-04-16 DIAGNOSIS — M353 Polymyalgia rheumatica: Secondary | ICD-10-CM

## 2014-04-16 DIAGNOSIS — R300 Dysuria: Secondary | ICD-10-CM | POA: Diagnosis not present

## 2014-04-16 LAB — BASIC METABOLIC PANEL
BUN: 21 mg/dL (ref 6–23)
CO2: 24 mEq/L (ref 19–32)
Calcium: 9.2 mg/dL (ref 8.4–10.5)
Chloride: 100 mEq/L (ref 96–112)
Creatinine, Ser: 1.1 mg/dL (ref 0.4–1.2)
GFR: 51.42 mL/min — ABNORMAL LOW (ref >60.00)
Glucose, Bld: 108 mg/dL — ABNORMAL HIGH (ref 70–99)
Potassium: 4.3 mEq/L (ref 3.5–5.1)
Sodium: 133 mEq/L — ABNORMAL LOW (ref 135–145)

## 2014-04-16 LAB — POCT URINALYSIS DIPSTICK
Bilirubin, UA: NEGATIVE
Glucose, UA: NEGATIVE
Ketones, UA: NEGATIVE
Nitrite, UA: POSITIVE
Protein, UA: NEGATIVE
Spec Grav, UA: 1.01
Urobilinogen, UA: 0.2
pH, UA: 6.5

## 2014-04-16 MED ORDER — CEPHALEXIN 500 MG PO CAPS: 500.0000 mg | ORAL_CAPSULE | Freq: Three times a day (TID) | ORAL | Status: DC

## 2014-04-16 NOTE — Patient Instructions (Signed)

## 2014-04-16 NOTE — Progress Notes (Signed)
Subjective:    Patient ID: Theresa Norton, female    DOB: November 21, 1928, 80 y.o.   MRN: [id number]  HPI  patient has multiple chronic problems including history of AV heart block, polymyalgia rheumatica , hypertension , atrial fibrillation. She states she saw cardiology yesterday. She seen today for the following issues  1 week history of dysuria. Similar symptoms October and grew out Escherichia coli which was treated with Keflex. One week of burning with urination and increased odor. No fevers or chills. No nausea or vomiting. No flank pain.   chronic bilateral leg edema. She has gained some weight since last visit. No dyspnea. No orthopnea. With suspected multifactorial edema with low-dose prednisone use , venous stasis, probable diastolic heart failure. She is currently taking her furosemide 20 mg once every day. She is concerned about kidney problems though her last creatinine was 1.2.   PMR has been stable. She is on low-dose prednisone 10 mg daily. Occasional low back pain but no severe myalgias.  Past Medical History  Diagnosis Date  . Allergy   . Hypertension   . Colon polyps   . Heart block AV second degree     a. s/p MDT Addapta dual chamber pacemaker 05/2013 by Dr Rayann Heman.  Marland Kitchen PAF (paroxysmal atrial fibrillation)     a. Found post-pacer 05/2013->Xarelto added;  b. 05/2013 Echo: EF 60-65%, mild LVH, nl wall motion w/o rwma.  . Groin hematoma     a. 05/2013 R groin hematoma post-cath - u/s 05/31/13 large 7.49 cm r inguinal hematoma extending into thigh, no psa or avf.  . Anemia   . CAD (coronary artery disease)     a. 05/2013 nonobs dzs by cath.  . Pacemaker   . H/O hiatal hernia   . Arthritis     "in my fingers" (07/08/2013)  . On home oxygen therapy     "2L maybe 12h/day" (07/08/2013)  . Pacemaker    Past Surgical History  Procedure Laterality Date  . Bunionectomy Bilateral 1967  . Breast cyst excision Right 1959; 1970's  . Insert / replace / remove pacemaker  05/2013    MDT  ADDRL1 implanted by Dr Rayann Heman for heart block  . Insert / replace / remove pacemaker  06/2013    Right ventricular lead perforation with tamponade  . Cardiac catheterization    . Cataract extraction w/ intraocular lens  implant, bilateral Bilateral   . Dilation and curettage of uterus    . Varicose vein surgery Bilateral ~ 2002  . Left heart catheterization with coronary angiogram N/A 05/27/2013    Procedure: LEFT HEART CATHETERIZATION WITH CORONARY ANGIOGRAM;  Surgeon: Birdie Riddle, MD;  Location: [facility];  Service: Cardiovascular;  Laterality: N/A;  . Temporary pacemaker insertion N/A 05/27/2013    Procedure: TEMPORARY PACEMAKER INSERTION;  Surgeon: Birdie Riddle, MD;  Location: [facility];  Service: Cardiovascular;  Laterality: N/A;  . Permanent pacemaker insertion N/A 05/27/2013    Procedure: PERMANENT PACEMAKER INSERTION;  Surgeon: Coralyn Mark, MD;  Location: [facility];  Service: Cardiovascular;  Laterality: N/A;  . Pericardial tap N/A 06/11/2013    Procedure: PERICARDIAL TAP;  Surgeon: Birdie Riddle, MD;  Location: [facility];  Service: Cardiovascular;  Laterality: N/A;  . Lead revision N/A 06/13/2013    Procedure: LEAD REVISION;  Surgeon: Coralyn Mark, MD;  Location: [facility];  Service: Cardiovascular;  Laterality: N/A;    reports that she has never smoked. She has never  used smokeless tobacco. She reports that she drinks about 8.4 oz of alcohol per week. She reports that she does not use illicit drugs. family history includes Cancer in her father. Allergies  Allergen Reactions  . Codeine     GI upset  . Morphine And Related     GI upset      Review of Systems  Constitutional: Negative for fatigue.  Eyes: Negative for visual disturbance.  Respiratory: Negative for cough, chest tightness, shortness of breath and wheezing.   Cardiovascular: Negative for chest pain, palpitations and leg swelling.  Gastrointestinal: Negative for abdominal pain.  Genitourinary:  Positive for dysuria. Negative for flank pain.  Neurological: Negative for dizziness, seizures, syncope, weakness, light-headedness and headaches.       Objective:   Physical Exam  Constitutional: She appears well-developed and well-nourished.  Cardiovascular: Normal rate and regular rhythm.   Pulmonary/Chest: Effort normal and breath sounds normal. No respiratory distress. She has no wheezes. She has no rales.  Musculoskeletal:  Only trace edema ankles bilaterally          Assessment & Plan:   #1 dysuria. Suspect UTI based on urine dipstick results. Send urine culture. Start back Keflex 500 mg 3 times a day for one week #2 PMR. Symptomatically stable. Our goal had been to repeat sedimentation rate today but will hold since she has current UTI. Bring back 1 month and repeat sedimentation rate. If stable consider starting slow tapering off her prednisone #3 bilateral leg edema. Chronic. Likely multifactorial related to prednisone, venous stasis, diastolic heart failure. Continue elevation. She has declined compression garments. Continue furosemide 20 mg daily and potassium supplement. Check basic metabolic panel.

## 2014-04-16 NOTE — Progress Notes (Signed)
Pre visit review using our clinic review tool, if applicable. No additional management support is needed unless otherwise documented below in the visit note. 

## 2014-04-18 LAB — URINE CULTURE: Colony Count: >=100000

## 2014-05-05 ENCOUNTER — Other Ambulatory Visit: Payer: Self-pay | Admitting: Family Medicine

## 2014-05-05 ENCOUNTER — Telehealth: Payer: Self-pay | Admitting: Internal Medicine

## 2014-05-05 NOTE — Telephone Encounter (Signed)
Primary cardiologist is Dr Doylene Canard and she should follow up wit him for her swelling.  I will forward to device to have them call her about the interrogation sent 2 weeks ago

## 2014-05-05 NOTE — Telephone Encounter (Signed)
New msg      Pt daughter calling would like to know if pt should be seen sooner because of low hr of about 70 and she is having swelling of her ankles and feet.   Last pacer check was completed from home 2 wks ago per daughter and they haven't heard anything about results.    Please call pt daughter [name] at [phone].

## 2014-05-06 ENCOUNTER — Telehealth: Payer: Self-pay | Admitting: Cardiology

## 2014-05-06 NOTE — Telephone Encounter (Signed)
Pt daughter called in and stated that pt has not been feeling well. Pt has been a little more fatigued than normal and has had some swelling in her legs. According to paceart pt was suppose to follow up w/ MD July 2015. Pt daughter agreed to move appt up to 05-11-14 at 12:15 PM.

## 2014-05-06 NOTE — Telephone Encounter (Signed)
Spoke w/pt to let know ppm check was normal. Pt scheduled for 05-11-14/pt aware//kwm

## 2014-05-11 ENCOUNTER — Encounter: Payer: Self-pay | Admitting: Internal Medicine

## 2014-05-11 ENCOUNTER — Ambulatory Visit (INDEPENDENT_AMBULATORY_CARE_PROVIDER_SITE_OTHER): Payer: Medicare Other | Admitting: Internal Medicine

## 2014-05-11 VITALS — BP 160/60 | HR 68 | Ht 63.0 in | Wt 163.0 lb

## 2014-05-11 DIAGNOSIS — M353 Polymyalgia rheumatica: Secondary | ICD-10-CM | POA: Diagnosis not present

## 2014-05-11 DIAGNOSIS — I442 Atrioventricular block, complete: Secondary | ICD-10-CM

## 2014-05-11 DIAGNOSIS — R609 Edema, unspecified: Secondary | ICD-10-CM | POA: Diagnosis not present

## 2014-05-11 DIAGNOSIS — I10 Essential (primary) hypertension: Secondary | ICD-10-CM | POA: Diagnosis not present

## 2014-05-11 DIAGNOSIS — Z950 Presence of cardiac pacemaker: Secondary | ICD-10-CM

## 2014-05-11 LAB — MDC_IDC_ENUM_SESS_TYPE_INCLINIC
Battery Impedance: 109 Ohm
Battery Remaining Longevity: 132 mo
Battery Voltage: 2.8 V
Brady Statistic AP VP Percent: 6 %
Brady Statistic AP VS Percent: 0 %
Brady Statistic AS VP Percent: 94 %
Brady Statistic AS VS Percent: 0 %
Date Time Interrogation Session: 20160104125823
Lead Channel Impedance Value: 469 Ohm
Lead Channel Impedance Value: 521 Ohm
Lead Channel Pacing Threshold Amplitude: 0.5 V
Lead Channel Pacing Threshold Amplitude: 1 V
Lead Channel Pacing Threshold Pulse Width: 0.4 ms
Lead Channel Pacing Threshold Pulse Width: 0.4 ms
Lead Channel Sensing Intrinsic Amplitude: 4 mV
Lead Channel Setting Pacing Amplitude: 2 V
Lead Channel Setting Pacing Amplitude: 2.5 V
Lead Channel Setting Pacing Pulse Width: 0.4 ms
Lead Channel Setting Sensing Sensitivity: 8 mV

## 2014-05-11 MED ORDER — FUROSEMIDE 20 MG PO TABS: ORAL_TABLET | ORAL | Status: DC

## 2014-05-11 MED ORDER — LISINOPRIL 5 MG PO TABS: 5.0000 mg | ORAL_TABLET | Freq: Every day | ORAL | Status: DC

## 2014-05-11 NOTE — Patient Instructions (Addendum)
Your physician has recommended you make the following change in your medication:  1) STOP diltiazem 2) STOP digoxin 3) Start lisinopril 5 mg once daily. 4) Increase lasix to 20 mg one tablet by mouth daily  Remote monitoring is used to monitor your Pacemaker of ICD from home. This monitoring reduces the number of office visits required to check your device to one time per year. It allows [place] to keep an eye on the functioning of your device to ensure it is working properly. You are scheduled for a device check from home on 08/10/14. You may send your transmission at any time that day. If you have a wireless device, the transmission will be sent automatically. After your physician reviews your transmission, you will receive a postcard with your next transmission date.  Your physician recommends that you schedule a follow-up appointment in: 2 months with Theresa Merle, NP  Your physician wants you to follow-up in: 1 year with Dr. Rayann Norton. You will receive a reminder letter in the mail two months in advance. If you don't receive a letter, please call our office to schedule the follow-up appointment.

## 2014-05-11 NOTE — Progress Notes (Signed)
PCP: Eulas Post, MD Primary Cardiologist:  Dr Lowell Guitar Furlough is a 81 y.o. female who presents today for electrophysiology followup.   She has done reasonably well since I saw her last.  She has PMR and is on chronic steroids.  She has developed progressive BLE edema and weight gain.  She reports stable cough and SOB.  She has rare dizziness. Today, she denies symptoms of palpitations, chest pain,  lower extremity edema, presyncope, or syncope.  The patient is otherwise without complaint today.   Past Medical History  Diagnosis Date  . Allergy   . Hypertension   . Colon polyps   . Heart block AV second degree     a. s/p MDT Addapta dual chamber pacemaker 05/2013 by Dr Rayann Heman.  Marland Kitchen PAF (paroxysmal atrial fibrillation)     a. Found post-pacer 05/2013->Xarelto added;  b. 05/2013 Echo: EF 60-65%, mild LVH, nl wall motion w/o rwma.  . Groin hematoma     a. 05/2013 R groin hematoma post-cath - u/s 05/31/13 large 7.49 cm r inguinal hematoma extending into thigh, no psa or avf.  . Anemia   . CAD (coronary artery disease)     a. 05/2013 nonobs dzs by cath.  . Pacemaker   . H/O hiatal hernia   . Arthritis     "in my fingers" (07/08/2013)  . On home oxygen therapy     "2L maybe 12h/day" (07/08/2013)  . Pacemaker    Past Surgical History  Procedure Laterality Date  . Bunionectomy Bilateral 1967  . Breast cyst excision Right 1959; 1970's  . Insert / replace / remove pacemaker  05/2013    MDT ADDRL1 implanted by Dr Rayann Heman for heart block  . Insert / replace / remove pacemaker  06/2013    Right ventricular lead perforation with tamponade  . Cardiac catheterization    . Cataract extraction w/ intraocular lens  implant, bilateral Bilateral   . Dilation and curettage of uterus    . Varicose vein surgery Bilateral ~ 2002  . Left heart catheterization with coronary angiogram N/A 05/27/2013    Procedure: LEFT HEART CATHETERIZATION WITH CORONARY ANGIOGRAM;  Surgeon: Birdie Riddle, MD;   Location: [facility];  Service: Cardiovascular;  Laterality: N/A;  . Temporary pacemaker insertion N/A 05/27/2013    Procedure: TEMPORARY PACEMAKER INSERTION;  Surgeon: Birdie Riddle, MD;  Location: [facility];  Service: Cardiovascular;  Laterality: N/A;  . Permanent pacemaker insertion N/A 05/27/2013    Procedure: PERMANENT PACEMAKER INSERTION;  Surgeon: Coralyn Mark, MD;  Location: [facility];  Service: Cardiovascular;  Laterality: N/A;  . Pericardial tap N/A 06/11/2013    Procedure: PERICARDIAL TAP;  Surgeon: Birdie Riddle, MD;  Location: [facility];  Service: Cardiovascular;  Laterality: N/A;  . Lead revision N/A 06/13/2013    Procedure: LEAD REVISION;  Surgeon: Coralyn Mark, MD;  Location: [facility];  Service: Cardiovascular;  Laterality: N/A;    Current Outpatient Prescriptions  Medication Sig Dispense Refill  . furosemide (LASIX) 20 MG tablet Take one tablet by mouth daily 30 tablet 6  . metoprolol (LOPRESSOR) 50 MG tablet TAKE 1 TABLET BY MOUTH TWICE DAILY (Patient taking differently: TAKE 1 TABLET BY MOUTH  DAILY) 60 tablet 1  . Multiple Vitamins-Minerals (PRESERVISION AREDS) TABS Take by mouth daily. 1 CAP PO QD(SUP FOR EYES)    . potassium chloride (K-DUR) 10 MEQ tablet Take 1 tablet (10 mEq total) by mouth daily. 30 tablet 1  .  predniSONE (DELTASONE) 10 MG tablet TAKE 2 TABLETS BY MOUTH DAILY FOR 3 DAYS, THEN TAKE 1 TABLET DAILY AS DIRECTED 60 tablet 3  . acetaminophen (TYLENOL) 325 MG tablet Take 325 mg by mouth every 6 (six) hours as needed for mild pain.    Marland Kitchen ALPRAZolam (XANAX) 0.25 MG tablet Take 1 tablet (0.25 mg total) by mouth 2 (two) times daily as needed for anxiety. (Patient not taking: Reported on 05/11/2014) 60 tablet 0  . IRON PO Take 1 tablet by mouth daily.     Marland Kitchen lisinopril (PRINIVIL,ZESTRIL) 5 MG tablet Take 1 tablet (5 mg total) by mouth daily. 30 tablet 6   No current facility-administered medications for this visit.   ROS- sinus congestions, fatigue,  palpitations, urinary difficulty, all other systems are reviewed and negative except as per HPI above  Physical Exam: Filed Vitals:   05/11/14 1231  BP: 160/60  Pulse: 68  Height: 5\' 3"  (1.6 m)  Weight: 163 lb (73.936 kg)    GEN- The patient is elderly appearing, alert and oriented x 3 today.   Head- normocephalic, atraumatic Eyes-  Sclera clear, conjunctiva pink Ears- hearing intact Oropharynx- clear Lungs- Clear to ausculation bilaterally, normal work of breathing Chest- pacemaker pocket is well healed Heart- Regular rate and rhythm, no murmurs, rubs or gallops, PMI not laterally displaced GI- soft, NT, ND, + BS Extremities- no clubbing, cyanosis, +2 edema  Pacemaker interrogation- reviewed in detail today,  See PACEART report  Assessment and Plan:  1. Complete heart block Normal pacemaker function See Pace Art report No changes today  2. edema Likely due to venous insufficiency and worsened by diltiazem and prednisone Stop diltiazem PCP to continue to wean prednisone Support hose encouraged Increase lasix to daily Stop digoxin as EF is preserved and I do not see indication for this medicine.  Given advanced age and small therapeutic window, I think that we should avoid  3. Hypertension. Also worsened by prednisone Will add lisinopril 5mg  daily Daily lasix  Return to see Truitt Merle to follow-up for above changes in 2 months  Additional cardiology follow-up as scheduled with Dr Doylene Canard [facility]  Return to see me in 1 year

## 2014-05-18 ENCOUNTER — Encounter: Payer: Self-pay | Admitting: Family Medicine

## 2014-05-18 ENCOUNTER — Ambulatory Visit (INDEPENDENT_AMBULATORY_CARE_PROVIDER_SITE_OTHER): Payer: Medicare Other | Admitting: Family Medicine

## 2014-05-18 VITALS — BP 140/70 | HR 73 | Temp 97.7°F | Wt 162.0 lb

## 2014-05-18 DIAGNOSIS — M353 Polymyalgia rheumatica: Secondary | ICD-10-CM

## 2014-05-18 LAB — SEDIMENTATION RATE: Sed Rate: 23 mm/hr — ABNORMAL HIGH (ref 0–22)

## 2014-05-18 NOTE — Progress Notes (Signed)
Subjective:    Patient ID: Theresa Norton, female    DOB: May 18, 1928, 81 y.o.   MRN: [id number]  HPI Patient seen for follow-up regarding polymyalgia. Symptomatically stable. She is on prednisone 10 mg daily. Our plan is if sedimentation rate today stable consider very slow taper of 1 mg increments. She recently saw cardiology. Had discontinuation of digoxin and diltiazem and started on lisinopril 5 mg daily. Blood pressures been stable. Edema stable.  Past Medical History  Diagnosis Date  . Allergy   . Hypertension   . Colon polyps   . Heart block AV second degree     a. s/p MDT Addapta dual chamber pacemaker 05/2013 by Dr Rayann Heman.  Marland Kitchen PAF (paroxysmal atrial fibrillation)     a. Found post-pacer 05/2013->Xarelto added;  b. 05/2013 Echo: EF 60-65%, mild LVH, nl wall motion w/o rwma.  . Groin hematoma     a. 05/2013 R groin hematoma post-cath - u/s 05/31/13 large 7.49 cm r inguinal hematoma extending into thigh, no psa or avf.  . Anemia   . CAD (coronary artery disease)     a. 05/2013 nonobs dzs by cath.  . Pacemaker   . H/O hiatal hernia   . Arthritis     "in my fingers" (07/08/2013)  . On home oxygen therapy     "2L maybe 12h/day" (07/08/2013)  . Pacemaker    Past Surgical History  Procedure Laterality Date  . Bunionectomy Bilateral 1967  . Breast cyst excision Right 1959; 1970's  . Insert / replace / remove pacemaker  05/2013    MDT ADDRL1 implanted by Dr Rayann Heman for heart block  . Insert / replace / remove pacemaker  06/2013    Right ventricular lead perforation with tamponade  . Cardiac catheterization    . Cataract extraction w/ intraocular lens  implant, bilateral Bilateral   . Dilation and curettage of uterus    . Varicose vein surgery Bilateral ~ 2002  . Left heart catheterization with coronary angiogram N/A 05/27/2013    Procedure: LEFT HEART CATHETERIZATION WITH CORONARY ANGIOGRAM;  Surgeon: Birdie Riddle, MD;  Location: [facility];  Service: Cardiovascular;  Laterality:  N/A;  . Temporary pacemaker insertion N/A 05/27/2013    Procedure: TEMPORARY PACEMAKER INSERTION;  Surgeon: Birdie Riddle, MD;  Location: [facility];  Service: Cardiovascular;  Laterality: N/A;  . Permanent pacemaker insertion N/A 05/27/2013    Procedure: PERMANENT PACEMAKER INSERTION;  Surgeon: Coralyn Mark, MD;  Location: [facility];  Service: Cardiovascular;  Laterality: N/A;  . Pericardial tap N/A 06/11/2013    Procedure: PERICARDIAL TAP;  Surgeon: Birdie Riddle, MD;  Location: [facility];  Service: Cardiovascular;  Laterality: N/A;  . Lead revision N/A 06/13/2013    Procedure: LEAD REVISION;  Surgeon: Coralyn Mark, MD;  Location: [facility];  Service: Cardiovascular;  Laterality: N/A;    reports that she has never smoked. She has never used smokeless tobacco. She reports that she drinks about 8.4 oz of alcohol per week. She reports that she does not use illicit drugs. family history includes Cancer in her father. Allergies  Allergen Reactions  . Codeine     GI upset  . Morphine And Related     GI upset      Review of Systems  Constitutional: Negative for fatigue.  Eyes: Negative for visual disturbance.  Respiratory: Negative for cough, chest tightness, shortness of breath and wheezing.   Cardiovascular: Positive for leg swelling. Negative for  chest pain and palpitations.  Musculoskeletal: Negative for myalgias.  Neurological: Negative for dizziness, seizures, syncope, weakness, light-headedness and headaches.       Objective:   Physical Exam  Constitutional: She appears well-developed and well-nourished. No distress.  Neck: Neck supple. No thyromegaly present.  Cardiovascular: Normal rate and regular rhythm.   Pulmonary/Chest: Effort normal and breath sounds normal. No respiratory distress. She has no wheezes. She has no rales.  Musculoskeletal:  Only trace edema legs bilaterally          Assessment & Plan:  Polymyalgia rheumatica. Recheck sedimentation rate.  If stable start slow 1 mg increment taper of prednisone. She was started last spring. We explained that duration of PMR varies considerably from person to person but hopefully we can taper off within 12 to 18 months.

## 2014-05-18 NOTE — Progress Notes (Signed)
Pre visit review using our clinic review tool, if applicable. No additional management support is needed unless otherwise documented below in the visit note. 

## 2014-06-08 ENCOUNTER — Encounter: Payer: Medicare Other | Admitting: Internal Medicine

## 2014-06-17 DIAGNOSIS — K5730 Diverticulosis of large intestine without perforation or abscess without bleeding: Secondary | ICD-10-CM | POA: Diagnosis not present

## 2014-06-17 DIAGNOSIS — D122 Benign neoplasm of ascending colon: Secondary | ICD-10-CM | POA: Diagnosis not present

## 2014-06-17 DIAGNOSIS — R1013 Epigastric pain: Secondary | ICD-10-CM | POA: Diagnosis not present

## 2014-06-17 DIAGNOSIS — K3189 Other diseases of stomach and duodenum: Secondary | ICD-10-CM | POA: Diagnosis not present

## 2014-06-17 DIAGNOSIS — K529 Noninfective gastroenteritis and colitis, unspecified: Secondary | ICD-10-CM | POA: Diagnosis not present

## 2014-06-17 DIAGNOSIS — K219 Gastro-esophageal reflux disease without esophagitis: Secondary | ICD-10-CM | POA: Diagnosis not present

## 2014-06-17 DIAGNOSIS — Z1211 Encounter for screening for malignant neoplasm of colon: Secondary | ICD-10-CM | POA: Diagnosis not present

## 2014-06-17 DIAGNOSIS — K635 Polyp of colon: Secondary | ICD-10-CM | POA: Diagnosis not present

## 2014-06-17 DIAGNOSIS — K319 Disease of stomach and duodenum, unspecified: Secondary | ICD-10-CM | POA: Diagnosis not present

## 2014-06-17 DIAGNOSIS — K6289 Other specified diseases of anus and rectum: Secondary | ICD-10-CM | POA: Diagnosis not present

## 2014-07-02 DIAGNOSIS — R140 Abdominal distension (gaseous): Secondary | ICD-10-CM | POA: Diagnosis not present

## 2014-07-02 DIAGNOSIS — K5900 Constipation, unspecified: Secondary | ICD-10-CM | POA: Diagnosis not present

## 2014-07-13 ENCOUNTER — Ambulatory Visit: Payer: Medicare Other | Admitting: Nurse Practitioner

## 2014-07-13 ENCOUNTER — Ambulatory Visit: Payer: Medicare Other | Admitting: Physician Assistant

## 2014-07-27 ENCOUNTER — Encounter: Payer: Self-pay | Admitting: Family Medicine

## 2014-07-27 ENCOUNTER — Ambulatory Visit (INDEPENDENT_AMBULATORY_CARE_PROVIDER_SITE_OTHER): Payer: Medicare Other | Admitting: Family Medicine

## 2014-07-27 VITALS — BP 132/78 | HR 80 | Temp 98.4°F | Wt 161.0 lb

## 2014-07-27 DIAGNOSIS — M353 Polymyalgia rheumatica: Secondary | ICD-10-CM

## 2014-07-27 LAB — SEDIMENTATION RATE: Sed Rate: 14 mm/hr (ref 0–22)

## 2014-07-27 NOTE — Progress Notes (Signed)
Subjective:    Patient ID: Theresa Norton, female    DOB: 1928-09-03, 81 y.o.   MRN: [id number]  HPI Patient here for follow-up regarding her polymyalgia rheumatica. This was diagnosed last spring. She had prompt and fairly dramatic improvement with prednisone. Currently 10 mg daily. Our plan is repeat sedimentation rate today and if stable consider very slow taper at 1 mg increments. She has some occasional aches and pains but no severe neck or back pains. She has other medical problems including history of complete heart block, transient atrial fibrillation, elevated blood pressure. Currently takes lisinopril 5 mg once daily metoprolol. She's had occasional dry cough. No dizziness. No syncope. No chest pains.  Past Medical History  Diagnosis Date  . Allergy   . Hypertension   . Colon polyps   . Heart block AV second degree     a. s/p MDT Addapta dual chamber pacemaker 05/2013 by Dr Rayann Heman.  Marland Kitchen PAF (paroxysmal atrial fibrillation)     a. Found post-pacer 05/2013->Xarelto added;  b. 05/2013 Echo: EF 60-65%, mild LVH, nl wall motion w/o rwma.  . Groin hematoma     a. 05/2013 R groin hematoma post-cath - u/s 05/31/13 large 7.49 cm r inguinal hematoma extending into thigh, no psa or avf.  . Anemia   . CAD (coronary artery disease)     a. 05/2013 nonobs dzs by cath.  . Pacemaker   . H/O hiatal hernia   . Arthritis     "in my fingers" (07/08/2013)  . On home oxygen therapy     "2L maybe 12h/day" (07/08/2013)  . Pacemaker    Past Surgical History  Procedure Laterality Date  . Bunionectomy Bilateral 1967  . Breast cyst excision Right 1959; 1970's  . Insert / replace / remove pacemaker  05/2013    MDT ADDRL1 implanted by Dr Rayann Heman for heart block  . Insert / replace / remove pacemaker  06/2013    Right ventricular lead perforation with tamponade  . Cardiac catheterization    . Cataract extraction w/ intraocular lens  implant, bilateral Bilateral   . Dilation and curettage of uterus    .  Varicose vein surgery Bilateral ~ 2002  . Left heart catheterization with coronary angiogram N/A 05/27/2013    Procedure: LEFT HEART CATHETERIZATION WITH CORONARY ANGIOGRAM;  Surgeon: Birdie Riddle, MD;  Location: [facility];  Service: Cardiovascular;  Laterality: N/A;  . Temporary pacemaker insertion N/A 05/27/2013    Procedure: TEMPORARY PACEMAKER INSERTION;  Surgeon: Birdie Riddle, MD;  Location: [facility];  Service: Cardiovascular;  Laterality: N/A;  . Permanent pacemaker insertion N/A 05/27/2013    Procedure: PERMANENT PACEMAKER INSERTION;  Surgeon: Coralyn Mark, MD;  Location: [facility];  Service: Cardiovascular;  Laterality: N/A;  . Pericardial tap N/A 06/11/2013    Procedure: PERICARDIAL TAP;  Surgeon: Birdie Riddle, MD;  Location: [facility];  Service: Cardiovascular;  Laterality: N/A;  . Lead revision N/A 06/13/2013    Procedure: LEAD REVISION;  Surgeon: Coralyn Mark, MD;  Location: [facility];  Service: Cardiovascular;  Laterality: N/A;    reports that she has never smoked. She has never used smokeless tobacco. She reports that she drinks about 8.4 oz of alcohol per week. She reports that she does not use illicit drugs. family history includes Cancer in her father. Allergies  Allergen Reactions  . Codeine     GI upset  . Morphine And Related     GI  upset      Review of Systems  Constitutional: Negative for fever and chills.  Respiratory: Negative for cough, shortness of breath and wheezing.   Cardiovascular: Negative for chest pain, palpitations and leg swelling.       Objective:   Physical Exam  Constitutional: She appears well-developed and well-nourished. No distress.  Cardiovascular: Normal rate and regular rhythm.   Pulmonary/Chest: Effort normal and breath sounds normal. No respiratory distress. She has no wheezes. She has no rales.  Musculoskeletal: She exhibits no edema.          Assessment & Plan:  Polymyalgia rheumatica. Repeat sedimentation  rate. Hopefully we'll plan to start slow prednisone taper at 1 mg increments per month. She does not have any diabetes history

## 2014-07-27 NOTE — Progress Notes (Signed)
Pre visit review using our clinic review tool, if applicable. No additional management support is needed unless otherwise documented below in the visit note. 

## 2014-07-28 ENCOUNTER — Other Ambulatory Visit: Payer: Self-pay

## 2014-07-28 MED ORDER — PREDNISONE 5 MG PO TABS: 5.0000 mg | ORAL_TABLET | Freq: Every day | ORAL | Status: DC

## 2014-07-28 MED ORDER — PREDNISONE 1 MG PO TABS: ORAL_TABLET | ORAL | Status: DC

## 2014-08-10 ENCOUNTER — Ambulatory Visit (INDEPENDENT_AMBULATORY_CARE_PROVIDER_SITE_OTHER): Payer: Medicare Other | Admitting: *Deleted

## 2014-08-10 DIAGNOSIS — I442 Atrioventricular block, complete: Secondary | ICD-10-CM

## 2014-08-10 LAB — MDC_IDC_ENUM_SESS_TYPE_REMOTE
Battery Impedance: 132 Ohm
Battery Remaining Longevity: 120 mo
Battery Voltage: 2.8 V
Brady Statistic AP VP Percent: 1 %
Brady Statistic AP VS Percent: 0 %
Brady Statistic AS VP Percent: 99 %
Brady Statistic AS VS Percent: 0 %
Date Time Interrogation Session: 20160404121115
Lead Channel Impedance Value: 469 Ohm
Lead Channel Impedance Value: 516 Ohm
Lead Channel Pacing Threshold Amplitude: 0.5 V
Lead Channel Pacing Threshold Amplitude: 1.125 V
Lead Channel Pacing Threshold Pulse Width: 0.4 ms
Lead Channel Sensing Intrinsic Amplitude: 1.4 mV
Lead Channel Setting Pacing Amplitude: 2 V
Lead Channel Setting Pacing Amplitude: 2.5 V
Lead Channel Setting Pacing Pulse Width: 0.46 ms
Lead Channel Setting Sensing Sensitivity: 8 mV

## 2014-08-10 NOTE — Progress Notes (Signed)
Remote pacemaker transmission.   

## 2014-08-13 DIAGNOSIS — H3531 Nonexudative age-related macular degeneration: Secondary | ICD-10-CM | POA: Diagnosis not present

## 2014-08-13 DIAGNOSIS — H3532 Exudative age-related macular degeneration: Secondary | ICD-10-CM | POA: Diagnosis not present

## 2014-08-13 DIAGNOSIS — Z961 Presence of intraocular lens: Secondary | ICD-10-CM | POA: Diagnosis not present

## 2014-08-24 ENCOUNTER — Encounter: Payer: Self-pay | Admitting: Cardiology

## 2014-08-26 ENCOUNTER — Other Ambulatory Visit: Payer: Self-pay

## 2014-08-26 ENCOUNTER — Encounter: Payer: Self-pay | Admitting: Internal Medicine

## 2014-08-31 ENCOUNTER — Encounter: Payer: Self-pay | Admitting: Internal Medicine

## 2014-08-31 ENCOUNTER — Ambulatory Visit (INDEPENDENT_AMBULATORY_CARE_PROVIDER_SITE_OTHER): Payer: Medicare Other | Admitting: Internal Medicine

## 2014-08-31 VITALS — BP 118/64 | HR 89 | Ht 64.0 in | Wt 166.2 lb

## 2014-08-31 DIAGNOSIS — R609 Edema, unspecified: Secondary | ICD-10-CM | POA: Diagnosis not present

## 2014-08-31 DIAGNOSIS — I10 Essential (primary) hypertension: Secondary | ICD-10-CM

## 2014-08-31 NOTE — Progress Notes (Signed)
PCP: Eulas Post, MD Primary Cardiologist:  Dr Doylene Canard she says that she does not plan to see him regularly, just as needed  Theresa Norton is a 81 y.o. female who presents today for electrophysiology followup.   She has done reasonably well since I saw her last.  She has PMR and is on chronic steroids.  Her edema is much improved. Today, she denies symptoms of palpitations, chest pain,  lower extremity edema, presyncope, or syncope.  The patient is otherwise without complaint today.   Past Medical History  Diagnosis Date  . Allergy   . Hypertension   . Colon polyps   . Heart block AV second degree     a. s/p MDT Addapta dual chamber pacemaker 05/2013 by Dr Rayann Heman.  Marland Kitchen PAF (paroxysmal atrial fibrillation)     a. Found post-pacer 05/2013->Xarelto added;  b. 05/2013 Echo: EF 60-65%, mild LVH, nl wall motion w/o rwma.  . Groin hematoma     a. 05/2013 R groin hematoma post-cath - u/s 05/31/13 large 7.49 cm r inguinal hematoma extending into thigh, no psa or avf.  . Anemia   . CAD (coronary artery disease)     a. 05/2013 nonobs dzs by cath.  . Pacemaker   . H/O hiatal hernia   . Arthritis     "in my fingers" (07/08/2013)  . On home oxygen therapy     "2L maybe 12h/day" (07/08/2013)  . Pacemaker    Past Surgical History  Procedure Laterality Date  . Bunionectomy Bilateral 1967  . Breast cyst excision Right 1959; 1970's  . Insert / replace / remove pacemaker  05/2013    MDT ADDRL1 implanted by Dr Rayann Heman for heart block  . Insert / replace / remove pacemaker  06/2013    Right ventricular lead perforation with tamponade  . Cardiac catheterization    . Cataract extraction w/ intraocular lens  implant, bilateral Bilateral   . Dilation and curettage of uterus    . Varicose vein surgery Bilateral ~ 2002  . Left heart catheterization with coronary angiogram N/A 05/27/2013    Procedure: LEFT HEART CATHETERIZATION WITH CORONARY ANGIOGRAM;  Surgeon: Birdie Riddle, MD;  Location: [place] CATH [facility];   Service: Cardiovascular;  Laterality: N/A;  . Temporary pacemaker insertion N/A 05/27/2013    Procedure: TEMPORARY PACEMAKER INSERTION;  Surgeon: Birdie Riddle, MD;  Location: [place] [facility];  Service: Cardiovascular;  Laterality: N/A;  . Permanent pacemaker insertion N/A 05/27/2013    Procedure: PERMANENT PACEMAKER INSERTION;  Surgeon: Coralyn Mark, MD;  Location: [place] [facility]ATH [facility];  Service: Cardiovascular;  Laterality: N/A;  . Pericardial tap N/A 06/11/2013    Procedure: PERICARDIAL TAP;  Surgeon: Birdie Riddle, MD;  Location: [place] [facility]ATH [facility];  Service: Cardiovascular;  Laterality: N/A;  . Lead revision N/A 06/13/2013    Procedure: LEAD REVISION;  Surgeon: Coralyn Mark, MD;  Location: [place] CATH [facility];  Service: Cardiovascular;  Laterality: N/A;    Current Outpatient Prescriptions  Medication Sig Dispense Refill  . acetaminophen (TYLENOL) 325 MG tablet Take 325 mg by mouth every 6 (six) hours as needed for mild pain.    Marland Kitchen ALPRAZolam (XANAX) 0.25 MG tablet Take 1 tablet (0.25 mg total) by mouth 2 (two) times daily as needed for anxiety. 60 tablet 0  . furosemide (LASIX) 20 MG tablet Take 20 mg by mouth daily as needed (swelling).    . IRON PO Take 1 tablet by mouth daily.     Marland Kitchen lisinopril (PRINIVIL,ZESTRIL) 5  MG tablet Take 1 tablet (5 mg total) by mouth daily. 30 tablet 6  . Multiple Vitamins-Minerals (PRESERVISION AREDS) TABS Take 1 capsule by mouth every other day.     . predniSONE (DELTASONE) 1 MG tablet Take 4 mg by mouth daily. Pt also takes along with a 5 mg tablet by mouth daily to equal 9 mg daily    . predniSONE (DELTASONE) 5 MG tablet Take 1 tablet (5 mg total) by mouth daily. 30 tablet 5   No current facility-administered medications for this visit.   ROS- sinus congestions, fatigue, palpitations, urinary difficulty, all other systems are reviewed and negative except as per HPI above  Physical Exam: Filed Vitals:   08/31/14 1511 08/31/14 1514  BP:  118/64  Pulse:  89  Height:  5\' 4"  (1.626  m)  Weight: 166 lb 3.2 oz (75.388 kg) 166 lb 3.2 oz (75.388 kg)    GEN- The patient is elderly appearing, alert and oriented x 3 today.   Head- normocephalic, atraumatic Eyes-  Sclera clear, conjunctiva pink Ears- hearing intact Oropharynx- clear Lungs- Clear to ausculation bilaterally, normal work of breathing Chest- pacemaker pocket is well healed Heart- Regular rate and rhythm, no murmurs, rubs or gallops, PMI not laterally displaced GI- soft, NT, ND, + BS Extremities- no clubbing, cyanosis, trace edema  Pacemaker interrogation- Pacemaker not interrogated today  Assessment and Plan:  1. Complete heart block Pacemaker not interrogated today  2. edema Improved No changes  3. Hypertension. Stable No change required today  Return to see Cecille Rubin in 6 months Return to see me in 1 year

## 2014-08-31 NOTE — Patient Instructions (Signed)
Medication Instructions:  Your physician recommends that you continue on your current medications as directed. Please refer to the Current Medication list given to you today.   Labwork: None ordered  Testing/Procedures: None ordered  Follow-Up: Your physician wants you to follow-up in: 6 months with Truitt Merle, NP and 12 months with Dr Vallery Ridge will receive a reminder letter in the mail two months in advance. If you don't receive a letter, please call our office to schedule the follow-up appointment. .    Any Other Special Instructions Will Be Listed Below (If Applicable).

## 2014-09-08 ENCOUNTER — Encounter: Payer: Self-pay | Admitting: Cardiology

## 2014-10-05 ENCOUNTER — Other Ambulatory Visit: Payer: Self-pay | Admitting: Family Medicine

## 2014-10-12 ENCOUNTER — Encounter: Payer: Self-pay | Admitting: Family Medicine

## 2014-10-12 ENCOUNTER — Ambulatory Visit (INDEPENDENT_AMBULATORY_CARE_PROVIDER_SITE_OTHER): Payer: Medicare Other | Admitting: Family Medicine

## 2014-10-12 VITALS — BP 134/80 | HR 93 | Temp 98.4°F | Wt 165.0 lb

## 2014-10-12 DIAGNOSIS — M353 Polymyalgia rheumatica: Secondary | ICD-10-CM | POA: Diagnosis not present

## 2014-10-12 DIAGNOSIS — D509 Iron deficiency anemia, unspecified: Secondary | ICD-10-CM | POA: Diagnosis not present

## 2014-10-12 DIAGNOSIS — R5382 Chronic fatigue, unspecified: Secondary | ICD-10-CM | POA: Diagnosis not present

## 2014-10-12 DIAGNOSIS — E871 Hypo-osmolality and hyponatremia: Secondary | ICD-10-CM

## 2014-10-12 LAB — BASIC METABOLIC PANEL
BUN: 15 mg/dL (ref 6–23)
CO2: 29 mEq/L (ref 19–32)
Calcium: 9.2 mg/dL (ref 8.4–10.5)
Chloride: 98 mEq/L (ref 96–112)
Creatinine, Ser: 0.94 mg/dL (ref 0.40–1.20)
GFR: 60.28 mL/min (ref >60.00)
Glucose, Bld: 93 mg/dL (ref 70–99)
Potassium: 4.5 mEq/L (ref 3.5–5.1)
Sodium: 133 mEq/L — ABNORMAL LOW (ref 135–145)

## 2014-10-12 LAB — VITAMIN B12: Vitamin B-12: >1500 pg/mL — ABNORMAL HIGH (ref 211–911)

## 2014-10-12 LAB — CBC WITH DIFFERENTIAL/PLATELET
Basophils Absolute: 0 10*3/uL (ref 0.0–0.1)
Basophils Relative: 0.7 % (ref 0.0–3.0)
Eosinophils Absolute: 0.1 10*3/uL (ref 0.0–0.7)
Eosinophils Relative: 3.2 % (ref 0.0–5.0)
HCT: 39.4 % (ref 36.0–46.0)
Hemoglobin: 13.6 g/dL (ref 12.0–15.0)
Lymphocytes Relative: 24.7 % (ref 12.0–46.0)
Lymphs Abs: 1.1 10*3/uL (ref 0.7–4.0)
MCHC: 34.4 g/dL (ref 30.0–36.0)
MCV: 97.4 fl (ref 78.0–100.0)
Monocytes Absolute: 0.5 10*3/uL (ref 0.1–1.0)
Monocytes Relative: 11.8 % (ref 3.0–12.0)
Neutro Abs: 2.7 10*3/uL (ref 1.4–7.7)
Neutrophils Relative %: 59.6 % (ref 43.0–77.0)
Platelets: 279 10*3/uL (ref 150.0–400.0)
RBC: 4.04 Mil/uL (ref 3.87–5.11)
RDW: 14.2 % (ref 11.5–15.5)
WBC: 4.5 10*3/uL (ref 4.0–10.5)

## 2014-10-12 LAB — TSH: TSH: 1.65 u[IU]/mL (ref 0.35–4.50)

## 2014-10-12 LAB — SEDIMENTATION RATE: Sed Rate: 13 mm/hr (ref 0–22)

## 2014-10-12 NOTE — Progress Notes (Signed)
Subjective:    Patient ID: Theresa Norton, female    DOB: 1928-11-04, 81 y.o.   MRN: [id number]  HPI Patient has chronic problems of hypertension, complete heart block, atrial fibrillation, chronic anemia, hyponatremia, polymyalgia rheumatica. She's had some increased fatigue issues since last visit. We tapered her prednisone 9 mg. Recent sedimentation rate 14. She has had some body aches mostly upper back and shoulder region. Not clear whether this correlated and also with tapering back prednisone. Denies chest pains. No dyspnea at rest. Peripheral edema issues been stable recently.  She has long history of normocytic anemia. Increased fatigue of the past few months. She states she is sleeping well. Appetite and weight are stable. No chest pains.  Past Medical History  Diagnosis Date  . Allergy   . Hypertension   . Colon polyps   . Heart block AV second degree     a. s/p MDT Addapta dual chamber pacemaker 05/2013 by Dr Rayann Heman.  Marland Kitchen PAF (paroxysmal atrial fibrillation)     a. Found post-pacer 05/2013->Xarelto added;  b. 05/2013 Echo: EF 60-65%, mild LVH, nl wall motion w/o rwma.  . Groin hematoma     a. 05/2013 R groin hematoma post-cath - u/s 05/31/13 large 7.49 cm r inguinal hematoma extending into thigh, no psa or avf.  . Anemia   . CAD (coronary artery disease)     a. 05/2013 nonobs dzs by cath.  . Pacemaker   . H/O hiatal hernia   . Arthritis     "in my fingers" (07/08/2013)  . On home oxygen therapy     "2L maybe 12h/day" (07/08/2013)  . Pacemaker    Past Surgical History  Procedure Laterality Date  . Bunionectomy Bilateral 1967  . Breast cyst excision Right 1959; 1970's  . Insert / replace / remove pacemaker  05/2013    MDT ADDRL1 implanted by Dr Rayann Heman for heart block  . Insert / replace / remove pacemaker  06/2013    Right ventricular lead perforation with tamponade  . Cardiac catheterization    . Cataract extraction w/ intraocular lens  implant, bilateral Bilateral   .  Dilation and curettage of uterus    . Varicose vein surgery Bilateral ~ 2002  . Left heart catheterization with coronary angiogram N/A 05/27/2013    Procedure: LEFT HEART CATHETERIZATION WITH CORONARY ANGIOGRAM;  Surgeon: Birdie Riddle, MD;  Location: [place] CATH [facility];  Service: Cardiovascular;  Laterality: N/A;  . Temporary pacemaker insertion N/A 05/27/2013    Procedure: TEMPORARY PACEMAKER INSERTION;  Surgeon: Birdie Riddle, MD;  Location: [place] [facility];  Service: Cardiovascular;  Laterality: N/A;  . Permanent pacemaker insertion N/A 05/27/2013    Procedure: PERMANENT PACEMAKER INSERTION;  Surgeon: Coralyn Mark, MD;  Location: [place] C[facility];  Service: Cardiovascular;  Laterality: N/A;  . Pericardial tap N/A 06/11/2013    Procedure: PERICARDIAL TAP;  Surgeon: Birdie Riddle, MD;  Location: [place] C[facility];  Service: Cardiovascular;  Laterality: N/A;  . Lead revision N/A 06/13/2013    Procedure: LEAD REVISION;  Surgeon: Coralyn Mark, MD;  Location: [place] C[facility];  Service: Cardiovascular;  Laterality: N/A;    reports that she has never smoked. She has never used smokeless tobacco. She reports that she drinks about 8.4 oz of alcohol per week. She reports that she does not use illicit drugs. family history includes Cancer in her father. Allergies  Allergen Reactions  . Codeine     GI upset  . Morphine And Related  GI upset       Review of Systems  Constitutional: Positive for fatigue. Negative for fever, appetite change and unexpected weight change.  Eyes: Negative for visual disturbance.  Respiratory: Negative for cough, chest tightness, shortness of breath and wheezing.   Cardiovascular: Negative for chest pain, palpitations and leg swelling.  Gastrointestinal: Negative for abdominal pain.  Endocrine: Negative for polydipsia and polyuria.  Genitourinary: Negative for dysuria.  Musculoskeletal: Positive for back pain.  Skin: Negative for rash.  Neurological: Negative for dizziness, seizures,  syncope, weakness, light-headedness and headaches.  Psychiatric/Behavioral: Negative for dysphoric mood.       Objective:   Physical Exam  Constitutional: She is oriented to person, place, and time. She appears well-developed and well-nourished.  Neck: Neck supple. No thyromegaly present.  Cardiovascular: Normal rate and regular rhythm.   Pulmonary/Chest: Effort normal and breath sounds normal. No respiratory distress. She has no wheezes. She has no rales.  Musculoskeletal: She exhibits no edema.  Neurological: She is alert and oriented to person, place, and time.  Psychiatric: She has a normal mood and affect. Her behavior is normal. Judgment and thought content normal.          Assessment & Plan:  #1 PMR. Vague symptoms of fatigue as above. Repeat sedimentation rate. If stable, continue slow tapering at 1 mg increments. #2 history of chronic normocytic anemia. Repeat CBC. Check B12 level #3 fatigue. Likely multifactorial. Check labs above and TSH.

## 2014-10-12 NOTE — Progress Notes (Signed)
Pre visit review using our clinic review tool, if applicable. No additional management support is needed unless otherwise documented below in the visit note. 

## 2014-11-02 ENCOUNTER — Other Ambulatory Visit: Payer: Self-pay

## 2014-11-10 ENCOUNTER — Telehealth: Payer: Self-pay | Admitting: Cardiology

## 2014-11-10 ENCOUNTER — Encounter: Payer: Self-pay | Admitting: Internal Medicine

## 2014-11-10 ENCOUNTER — Ambulatory Visit (INDEPENDENT_AMBULATORY_CARE_PROVIDER_SITE_OTHER): Payer: Medicare Other | Admitting: *Deleted

## 2014-11-10 DIAGNOSIS — I442 Atrioventricular block, complete: Secondary | ICD-10-CM

## 2014-11-10 NOTE — Telephone Encounter (Signed)
Spoke with pt and reminded pt of remote transmission that is due today. Pt verbalized understanding.   

## 2014-11-11 ENCOUNTER — Encounter: Payer: Self-pay | Admitting: Cardiology

## 2014-11-11 DIAGNOSIS — I442 Atrioventricular block, complete: Secondary | ICD-10-CM

## 2014-11-12 NOTE — Progress Notes (Signed)
Remote pacemaker transmission.   

## 2014-11-14 LAB — CUP PACEART REMOTE DEVICE CHECK
Battery Impedance: 132 Ohm
Battery Remaining Longevity: 124 mo
Battery Voltage: 2.8 V
Brady Statistic AP VP Percent: 1 %
Brady Statistic AP VS Percent: 0 %
Brady Statistic AS VP Percent: 99 %
Brady Statistic AS VS Percent: 0 %
Date Time Interrogation Session: 20160706181346
Lead Channel Impedance Value: 495 Ohm
Lead Channel Impedance Value: 541 Ohm
Lead Channel Pacing Threshold Amplitude: 0.375 V
Lead Channel Pacing Threshold Amplitude: 0.875 V
Lead Channel Pacing Threshold Pulse Width: 0.4 ms
Lead Channel Pacing Threshold Pulse Width: 0.4 ms
Lead Channel Sensing Intrinsic Amplitude: 2.8 mV
Lead Channel Setting Pacing Amplitude: 2 V
Lead Channel Setting Pacing Amplitude: 2.5 V
Lead Channel Setting Pacing Pulse Width: 0.4 ms
Lead Channel Setting Sensing Sensitivity: 8 mV

## 2014-11-18 ENCOUNTER — Encounter: Payer: Self-pay | Admitting: Cardiology

## 2014-12-09 ENCOUNTER — Ambulatory Visit: Payer: Medicare Other | Admitting: Family Medicine

## 2014-12-10 ENCOUNTER — Encounter: Payer: Self-pay | Admitting: Cardiology

## 2014-12-15 ENCOUNTER — Encounter: Payer: Self-pay | Admitting: Family Medicine

## 2014-12-15 ENCOUNTER — Ambulatory Visit (INDEPENDENT_AMBULATORY_CARE_PROVIDER_SITE_OTHER): Payer: Medicare Other | Admitting: Family Medicine

## 2014-12-15 VITALS — BP 130/78 | HR 89 | Temp 97.8°F | Wt 173.0 lb

## 2014-12-15 DIAGNOSIS — M353 Polymyalgia rheumatica: Secondary | ICD-10-CM | POA: Diagnosis not present

## 2014-12-15 LAB — SEDIMENTATION RATE: Sed Rate: 12 mm/hr (ref 0–22)

## 2014-12-15 NOTE — Progress Notes (Signed)
Pre visit review using our clinic review tool, if applicable. No additional management support is needed unless otherwise documented below in the visit note. 

## 2014-12-15 NOTE — Progress Notes (Signed)
Subjective:    Patient ID: Theresa Norton, female    DOB: Dec 01, 1928, 81 y.o.   MRN: [id number]  HPI Patient seen for 2 month follow-up. Polymyalgia rheumatica. She's done well with tapering prednisone back- currently 7 mg with no breakthrough symptoms. She is here for repeat sedimentation rate today. No neck pain. No upper back pain. She has had some gradual weight gain which she attributes to possibly the prednisone. No major dietary changes. Does not get a lot of physical activity with exercise  Past Medical History  Diagnosis Date  . Allergy   . Hypertension   . Colon polyps   . Heart block AV second degree     a. s/p MDT Addapta dual chamber pacemaker 05/2013 by Dr Rayann Heman.  Marland Kitchen PAF (paroxysmal atrial fibrillation)     a. Found post-pacer 05/2013->Xarelto added;  b. 05/2013 Echo: EF 60-65%, mild LVH, nl wall motion w/o rwma.  . Groin hematoma     a. 05/2013 R groin hematoma post-cath - u/s 05/31/13 large 7.49 cm r inguinal hematoma extending into thigh, no psa or avf.  . Anemia   . CAD (coronary artery disease)     a. 05/2013 nonobs dzs by cath.  . Pacemaker   . H/O hiatal hernia   . Arthritis     "in my fingers" (07/08/2013)  . On home oxygen therapy     "2L maybe 12h/day" (07/08/2013)  . Pacemaker    Past Surgical History  Procedure Laterality Date  . Bunionectomy Bilateral 1967  . Breast cyst excision Right 1959; 1970's  . Insert / replace / remove pacemaker  05/2013    MDT ADDRL1 implanted by Dr Rayann Heman for heart block  . Insert / replace / remove pacemaker  06/2013    Right ventricular lead perforation with tamponade  . Cardiac catheterization    . Cataract extraction w/ intraocular lens  implant, bilateral Bilateral   . Dilation and curettage of uterus    . Varicose vein surgery Bilateral ~ 2002  . Left heart catheterization with coronary angiogram N/A 05/27/2013    Procedure: LEFT HEART CATHETERIZATION WITH CORONARY ANGIOGRAM;  Surgeon: Birdie Riddle, MD;  Location: [place] CATH  LAB;  Service: Cardiovascular;  Laterality: N/A;  . Temporary pacemaker insertion N/A 05/27/2013    Procedure: TEMPORARY PACEMAKER INSERTION;  Surgeon: Birdie Riddle, MD;  Location: [place] CATH [facility];  Service: Cardiovascular;  Laterality: N/A;  . Permanent pacemaker insertion N/A 05/27/2013    Procedure: PERMANENT PACEMAKER INSERTION;  Surgeon: Coralyn Mark, MD;  Location: [place] CATH [facility];  Service: Cardiovascular;  Laterality: N/A;  . Pericardial tap N/A 06/11/2013    Procedure: PERICARDIAL TAP;  Surgeon: Birdie Riddle, MD;  Location: [place] CATH LAB;  Service: Cardiovascular;  Laterality: N/A;  . Lead revision N/A 06/13/2013    Procedure: LEAD REVISION;  Surgeon: Coralyn Mark, MD;  Location: [place] CATH LAB;  Service: Cardiovascular;  Laterality: N/A;    reports that she has never smoked. She has never used smokeless tobacco. She reports that she drinks about 8.4 oz of alcohol per week. She reports that she does not use illicit drugs. family history includes Cancer in her father. Allergies  Allergen Reactions  . Codeine     GI upset  . Morphine And Related     GI upset      Review of Systems  Constitutional: Negative for appetite change and unexpected weight change.  Respiratory: Negative for shortness of breath.   Cardiovascular: Negative  for chest pain and leg swelling.       Objective:   Physical Exam  Constitutional: She appears well-developed and well-nourished.  Neck: Neck supple. No JVD present.  Cardiovascular: Normal rate and regular rhythm.   Pulmonary/Chest: Effort normal and breath sounds normal. No respiratory distress. She has no wheezes. She has no rales.  Musculoskeletal: She exhibits no edema.          Assessment & Plan:  Polymyalgia rheumatica. Stable. Recheck sedimentation rate. If stable, continue tapering her prednisone at 1 mg increments per month. Follow-up 2 months

## 2014-12-15 NOTE — Patient Instructions (Signed)
IF your sed rate is stable go ahead and reduce Prednisone to 6 mg ( one 5mg  and one 1 mg tablet daily) for one month and then....... IF no breakthrough symptoms go ahead and reduce in one month to 5 mg (one 5 mg tablet) once daily Office follow up in 2 months to reassess.

## 2014-12-24 ENCOUNTER — Other Ambulatory Visit: Payer: Self-pay | Admitting: Internal Medicine

## 2015-02-10 ENCOUNTER — Telehealth: Payer: Self-pay | Admitting: Cardiology

## 2015-02-10 ENCOUNTER — Ambulatory Visit: Payer: Medicare Other | Admitting: *Deleted

## 2015-02-10 NOTE — Telephone Encounter (Signed)
LMOVM reminding pt to send remote transmission.   

## 2015-02-11 ENCOUNTER — Encounter: Payer: Self-pay | Admitting: Cardiology

## 2015-02-12 ENCOUNTER — Ambulatory Visit (INDEPENDENT_AMBULATORY_CARE_PROVIDER_SITE_OTHER): Payer: Medicare Other | Admitting: *Deleted

## 2015-02-12 ENCOUNTER — Encounter: Payer: Self-pay | Admitting: Internal Medicine

## 2015-02-12 DIAGNOSIS — I442 Atrioventricular block, complete: Secondary | ICD-10-CM | POA: Diagnosis not present

## 2015-02-15 ENCOUNTER — Ambulatory Visit (INDEPENDENT_AMBULATORY_CARE_PROVIDER_SITE_OTHER): Payer: Medicare Other | Admitting: Family Medicine

## 2015-02-15 VITALS — BP 205/100 | HR 92 | Temp 97.9°F | Wt 175.8 lb

## 2015-02-15 DIAGNOSIS — R0600 Dyspnea, unspecified: Secondary | ICD-10-CM | POA: Diagnosis not present

## 2015-02-15 DIAGNOSIS — M353 Polymyalgia rheumatica: Secondary | ICD-10-CM

## 2015-02-15 DIAGNOSIS — L821 Other seborrheic keratosis: Secondary | ICD-10-CM | POA: Diagnosis not present

## 2015-02-15 DIAGNOSIS — I10 Essential (primary) hypertension: Secondary | ICD-10-CM

## 2015-02-15 LAB — SEDIMENTATION RATE: Sed Rate: 16 mm/hr (ref 0–22)

## 2015-02-15 MED ORDER — AMLODIPINE BESYLATE 5 MG PO TABS: 5.0000 mg | ORAL_TABLET | Freq: Every day | ORAL | Status: DC

## 2015-02-15 NOTE — Progress Notes (Signed)
Subjective:    Patient ID: Theresa Norton, female    DOB: 01-Feb-1929, 81 y.o.   MRN: [id number]  HPI Patient seen for follow-up regarding several items  Polymyalgia rheumatica. Her current prednisone is down to 5 mg and she is here today for repeat sedimentation rate and hopefully further tapering. We are currently tapering at 1 mg increments. She's had no breakthrough myalgias.  She complains of some dyspnea. Somewhat intermittent. Present for several months. No increased peripheral edema. Denies any chest pain. She has occasional cough. No orthopnea. Currently only takes low-dose lisinopril for hypertension. No headaches.  Infrequently takes furosemide  She has history of hypertension, complete heart block, past history of pericardial effusion with cardiac tamponade.  Patient complains of slightly pruritic scaly lesion right parietal scalp. She just noticed this recently.  Past Medical History  Diagnosis Date  . Allergy   . Hypertension   . Colon polyps   . Heart block AV second degree     a. s/p MDT Addapta dual chamber pacemaker 05/2013 by Dr Rayann Heman.  Marland Kitchen PAF (paroxysmal atrial fibrillation)     a. Found post-pacer 05/2013->Xarelto added;  b. 05/2013 Echo: EF 60-65%, mild LVH, nl wall motion w/o rwma.  . Groin hematoma     a. 05/2013 R groin hematoma post-cath - u/s 05/31/13 large 7.49 cm r inguinal hematoma extending into thigh, no psa or avf.  . Anemia   . CAD (coronary artery disease)     a. 05/2013 nonobs dzs by cath.  . Pacemaker   . H/O hiatal hernia   . Arthritis     "in my fingers" (07/08/2013)  . On home oxygen therapy     "2L maybe 12h/day" (07/08/2013)  . Pacemaker    Past Surgical History  Procedure Laterality Date  . Bunionectomy Bilateral 1967  . Breast cyst excision Right 1959; 1970's  . Insert / replace / remove pacemaker  05/2013    MDT ADDRL1 implanted by Dr Rayann Heman for heart block  . Insert / replace / remove pacemaker  06/2013    Right ventricular lead  perforation with tamponade  . Cardiac catheterization    . Cataract extraction w/ intraocular lens  implant, bilateral Bilateral   . Dilation and curettage of uterus    . Varicose vein surgery Bilateral ~ 2002  . Left heart catheterization with coronary angiogram N/A 05/27/2013    Procedure: LEFT HEART CATHETERIZATION WITH CORONARY ANGIOGRAM;  Surgeon: Birdie Riddle, MD;  Location: [place] CATH LAB;  Service: Cardiovascular;  Laterality: N/A;  . Temporary pacemaker insertion N/A 05/27/2013    Procedure: TEMPORARY PACEMAKER INSERTION;  Surgeon: Birdie Riddle, MD;  Location: [place] CATH [facility];  Service: Cardiovascular;  Laterality: N/A;  . Permanent pacemaker insertion N/A 05/27/2013    Procedure: PERMANENT PACEMAKER INSERTION;  Surgeon: Coralyn Mark, MD;  Location: [place] CATH [facility];  Service: Cardiovascular;  Laterality: N/A;  . Pericardial tap N/A 06/11/2013    Procedure: PERICARDIAL TAP;  Surgeon: Birdie Riddle, MD;  Location: [place] CATH LAB;  Service: Cardiovascular;  Laterality: N/A;  . Lead revision N/A 06/13/2013    Procedure: LEAD REVISION;  Surgeon: Coralyn Mark, MD;  Location: [place] CATH LAB;  Service: Cardiovascular;  Laterality: N/A;    reports that she has never smoked. She has never used smokeless tobacco. She reports that she drinks about 8.4 oz of alcohol per week. She reports that she does not use illicit drugs. family history includes Cancer in her father. Allergies  Allergen Reactions  . Codeine     GI upset  . Morphine And Related     GI upset      Review of Systems  Constitutional: Positive for fatigue.  Eyes: Negative for visual disturbance.  Respiratory: Positive for shortness of breath. Negative for chest tightness and wheezing.   Cardiovascular: Negative for chest pain, palpitations and leg swelling.  Musculoskeletal: Negative for myalgias.  Neurological: Negative for dizziness, seizures, syncope, weakness, light-headedness and headaches.       Objective:   Physical Exam    Constitutional: She appears well-developed and well-nourished.  Neck: No JVD present.  Cardiovascular: Normal rate and regular rhythm.   Pulmonary/Chest: Effort normal and breath sounds normal. No respiratory distress. She has no wheezes. She has no rales.  Musculoskeletal: She exhibits no edema.  Skin:  Patient has thickened hyperkeratotic area right parietal scalp. No ulceration. Approximately 0.5 x 1 cm          Assessment & Plan:  #1 polymyalgia rheumatica. Clinically stable. Recheck sedimentation rate. If stable, continue 1 mg increment taper per month. After we get her down to about 3 mg we'll probably discontinue at that point #2 benign seborrheic keratosis right parietal scalp. We discussed possible treatment with liquid nitrogen and will wait until next week #3 severe elevated blood pressure. 205/100 in both the right and left arm seated. Add amlodipine 5 mg daily and remain on lisinopril. Reassess blood pressure 1 week #4 intermittent dyspnea. Her weight is up 2 pounds from last visit. She has no increased peripheral edema and normal lung exam. Symptoms have been very chronic and nonspecific and mild. If persisting at follow-up after better blood pressure control consider further evaluation. She has follow-up with cardiology soon

## 2015-02-15 NOTE — Patient Instructions (Signed)
DASH Eating Plan  DASH stands for "Dietary Approaches to Stop Hypertension." The DASH eating plan is a healthy eating plan that has been shown to reduce high blood pressure (hypertension). Additional health benefits may include reducing the risk of type 2 diabetes mellitus, heart disease, and stroke. The DASH eating plan may also help with weight loss.  WHAT DO I NEED TO KNOW ABOUT THE DASH EATING PLAN?  For the DASH eating plan, you will follow these general guidelines:  · Choose foods with a percent daily value for sodium of less than 5% (as listed on the food label).  · Use salt-free seasonings or herbs instead of table salt or sea salt.  · Check with your health care provider or pharmacist before using salt substitutes.  · Eat lower-sodium products, often labeled as "lower sodium" or "no salt added."  · Eat fresh foods.  · Eat more vegetables, fruits, and low-fat dairy products.  · Choose whole grains. Look for the word "whole" as the first word in the ingredient list.  · Choose fish and skinless chicken or turkey more often than red meat. Limit fish, poultry, and meat to 6 oz (170 g) each day.  · Limit sweets, desserts, sugars, and sugary drinks.  · Choose heart-healthy fats.  · Limit cheese to 1 oz (28 g) per day.  · Eat more home-cooked food and less restaurant, buffet, and fast food.  · Limit fried foods.  · Cook foods using methods other than frying.  · Limit canned vegetables. If you do use them, rinse them well to decrease the sodium.  · When eating at a restaurant, ask that your food be prepared with less salt, or no salt if possible.  WHAT FOODS CAN I EAT?  Seek help from a dietitian for individual calorie needs.  Grains  Whole grain or whole wheat bread. Brown rice. Whole grain or whole wheat pasta. Quinoa, bulgur, and whole grain cereals. Low-sodium cereals. Corn or whole wheat flour tortillas. Whole grain cornbread. Whole grain crackers. Low-sodium crackers.  Vegetables  Fresh or frozen vegetables  (raw, steamed, roasted, or grilled). Low-sodium or reduced-sodium tomato and vegetable juices. Low-sodium or reduced-sodium tomato sauce and paste. Low-sodium or reduced-sodium canned vegetables.   Fruits  All fresh, canned (in natural juice), or frozen fruits.  Meat and Other Protein Products  Ground beef (85% or leaner), grass-fed beef, or beef trimmed of fat. Skinless chicken or turkey. Ground chicken or turkey. Pork trimmed of fat. All fish and seafood. Eggs. Dried beans, peas, or lentils. Unsalted nuts and seeds. Unsalted canned beans.  Dairy  Low-fat dairy products, such as skim or 1% milk, 2% or reduced-fat cheeses, low-fat ricotta or cottage cheese, or plain low-fat yogurt. Low-sodium or reduced-sodium cheeses.  Fats and Oils  Tub margarines without trans fats. Light or reduced-fat mayonnaise and salad dressings (reduced sodium). Avocado. Safflower, olive, or canola oils. Natural peanut or almond butter.  Other  Unsalted popcorn and pretzels.  The items listed above may not be a complete list of recommended foods or beverages. Contact your dietitian for more options.  WHAT FOODS ARE NOT RECOMMENDED?  Grains  White bread. White pasta. White rice. Refined cornbread. Bagels and croissants. Crackers that contain trans fat.  Vegetables  Creamed or fried vegetables. Vegetables in a cheese sauce. Regular canned vegetables. Regular canned tomato sauce and paste. Regular tomato and vegetable juices.  Fruits  Dried fruits. Canned fruit in light or heavy syrup. Fruit juice.  Meat and Other Protein   Products  Fatty cuts of meat. Ribs, chicken wings, bacon, sausage, bologna, salami, chitterlings, fatback, hot dogs, bratwurst, and packaged luncheon meats. Salted nuts and seeds. Canned beans with salt.  Dairy  Whole or 2% milk, cream, half-and-half, and cream cheese. Whole-fat or sweetened yogurt. Full-fat cheeses or blue cheese. Nondairy creamers and whipped toppings. Processed cheese, cheese spreads, or cheese  curds.  Condiments  Onion and garlic salt, seasoned salt, table salt, and sea salt. Canned and packaged gravies. Worcestershire sauce. Tartar sauce. Barbecue sauce. Teriyaki sauce. Soy sauce, including reduced sodium. Steak sauce. Fish sauce. Oyster sauce. Cocktail sauce. Horseradish. Ketchup and mustard. Meat flavorings and tenderizers. Bouillon cubes. Hot sauce. Tabasco sauce. Marinades. Taco seasonings. Relishes.  Fats and Oils  Butter, stick margarine, lard, shortening, ghee, and bacon fat. Coconut, palm kernel, or palm oils. Regular salad dressings.  Other  Pickles and olives. Salted popcorn and pretzels.  The items listed above may not be a complete list of foods and beverages to avoid. Contact your dietitian for more information.  WHERE CAN I FIND MORE INFORMATION?  National [employer], [employer], [employer]: www.nhlbi.nih.gov/health/health-topics/topics/dash/     This information is not intended to replace advice given to you by your health care provider. Make sure you discuss any questions you have with your health care provider.     Document Released: 04/13/2011 Document Revised: 05/15/2014 Document Reviewed: 02/26/2013  Elsevier Interactive Patient Education ©2016 [employer]

## 2015-02-15 NOTE — Progress Notes (Signed)
Pre visit review using our clinic review tool, if applicable. No additional management support is needed unless otherwise documented below in the visit note. 

## 2015-02-17 ENCOUNTER — Telehealth: Payer: Self-pay

## 2015-02-17 NOTE — Telephone Encounter (Signed)
Left message for patient to call office regarding lab results and directions. 

## 2015-02-17 NOTE — Telephone Encounter (Signed)
-----   Message from Eulas Post, MD sent at 02/15/2015  3:59 PM EDT ----- Sedimentation rate remains low. Reduce prednisone to 4 mg once daily. After one month and that dosage further decreased to 3 mg once daily

## 2015-02-17 NOTE — Progress Notes (Signed)
Remote pacemaker transmission.   

## 2015-02-18 ENCOUNTER — Telehealth: Payer: Self-pay | Admitting: Family Medicine

## 2015-02-18 NOTE — Telephone Encounter (Signed)
Pt would like blood work results °

## 2015-02-19 LAB — CUP PACEART REMOTE DEVICE CHECK
Battery Impedance: 132 Ohm
Battery Remaining Longevity: 124 mo
Battery Voltage: 2.79 V
Brady Statistic AP VP Percent: 1 %
Brady Statistic AP VS Percent: 0 %
Brady Statistic AS VP Percent: 99 %
Brady Statistic AS VS Percent: 0 %
Date Time Interrogation Session: 20161007183925
Implantable Lead Implant Date: 20150120
Implantable Lead Implant Date: 20150120
Implantable Lead Location: 753859
Implantable Lead Location: 753860
Implantable Lead Model: 5076
Implantable Lead Model: 5076
Lead Channel Impedance Value: 449 Ohm
Lead Channel Impedance Value: 551 Ohm
Lead Channel Pacing Threshold Amplitude: 0.375 V
Lead Channel Pacing Threshold Amplitude: 1.125 V
Lead Channel Pacing Threshold Pulse Width: 0.4 ms
Lead Channel Pacing Threshold Pulse Width: 0.4 ms
Lead Channel Sensing Intrinsic Amplitude: 1.4 mV
Lead Channel Setting Pacing Amplitude: 2 V
Lead Channel Setting Pacing Amplitude: 2.5 V
Lead Channel Setting Pacing Pulse Width: 0.4 ms
Lead Channel Setting Sensing Sensitivity: 8 mV

## 2015-02-22 ENCOUNTER — Ambulatory Visit (INDEPENDENT_AMBULATORY_CARE_PROVIDER_SITE_OTHER): Payer: Medicare Other | Admitting: Family Medicine

## 2015-02-22 VITALS — BP 144/90 | HR 96 | Temp 97.9°F | Wt 178.3 lb

## 2015-02-22 DIAGNOSIS — I10 Essential (primary) hypertension: Secondary | ICD-10-CM

## 2015-02-22 DIAGNOSIS — M353 Polymyalgia rheumatica: Secondary | ICD-10-CM | POA: Diagnosis not present

## 2015-02-22 DIAGNOSIS — R05 Cough: Secondary | ICD-10-CM | POA: Diagnosis not present

## 2015-02-22 DIAGNOSIS — L820 Inflamed seborrheic keratosis: Secondary | ICD-10-CM | POA: Diagnosis not present

## 2015-02-22 DIAGNOSIS — R053 Chronic cough: Secondary | ICD-10-CM

## 2015-02-22 MED ORDER — VALSARTAN 80 MG PO TABS: 80.0000 mg | ORAL_TABLET | Freq: Every day | ORAL | Status: DC

## 2015-02-22 NOTE — Patient Instructions (Addendum)
Go ahead and reduce the prednisone to 4 mg daily. Take that for one month and if no increase in muscle aches reduce in one month to 3 mg daily.

## 2015-02-22 NOTE — Progress Notes (Signed)
Pre visit review using our clinic review tool, if applicable. No additional management support is needed unless otherwise documented below in the visit note. 

## 2015-02-22 NOTE — Progress Notes (Signed)
Subjective:    Patient ID: Theresa Norton, female    DOB: 18-Nov-1928, 81 y.o.   MRN: [id number]  HPI  Patient here for several issues as follows. She is accompanied by her son today.  Follow-up hypertension. Severe elevation week ago. Her systolic blood pressure was over 200. We added amlodipine 5 mg daily. Blood pressure much improved today. No headaches. No dizziness. No regular use of alcohol.  Persistent dry cough. Present for over 2 months. No dyspnea. No hemoptysis. She does take low-dose lisinopril 5 mg daily. Denies any postnasal drip symptoms. No GERD symptoms. No wheezing.  Scaly thickened skin lesion right parietal area of scalp. Noted last visit. Sometimes itches and irritated. We discussed possible treatment with liquid nitrogen today.  Polymyalgia rheumatica. Currently on prednisone 5 mg daily. Recent sedimentation rate 16. She still has some fatigue issues and arthralgias but these are mostly hands hips shoulders. She is not having much in the way of myalgias. Our plan was to go ahead and taper down her prednisone to 4 mg but she has not yet made this change  Past Medical History  Diagnosis Date  . Allergy   . Hypertension   . Colon polyps   . Heart block AV second degree     a. s/p MDT Addapta dual chamber pacemaker 05/2013 by Dr Rayann Heman.  Marland Kitchen PAF (paroxysmal atrial fibrillation)     a. Found post-pacer 05/2013->Xarelto added;  b. 05/2013 Echo: EF 60-65%, mild LVH, nl wall motion w/o rwma.  . Groin hematoma     a. 05/2013 R groin hematoma post-cath - u/s 05/31/13 large 7.49 cm r inguinal hematoma extending into thigh, no psa or avf.  . Anemia   . CAD (coronary artery disease)     a. 05/2013 nonobs dzs by cath.  . Pacemaker   . H/O hiatal hernia   . Arthritis     "in my fingers" (07/08/2013)  . On home oxygen therapy     "2L maybe 12h/day" (07/08/2013)  . Pacemaker    Past Surgical History  Procedure Laterality Date  . Bunionectomy Bilateral 1967  . Breast cyst  excision Right 1959; 1970's  . Insert / replace / remove pacemaker  05/2013    MDT ADDRL1 implanted by Dr Rayann Heman for heart block  . Insert / replace / remove pacemaker  06/2013    Right ventricular lead perforation with tamponade  . Cardiac catheterization    . Cataract extraction w/ intraocular lens  implant, bilateral Bilateral   . Dilation and curettage of uterus    . Varicose vein surgery Bilateral ~ 2002  . Left heart catheterization with coronary angiogram N/A 05/27/2013    Procedure: LEFT HEART CATHETERIZATION WITH CORONARY ANGIOGRAM;  Surgeon: Birdie Riddle, MD;  Location: [facility];  Service: Cardiovascular;  Laterality: N/A;  . Temporary pacemaker insertion N/A 05/27/2013    Procedure: TEMPORARY PACEMAKER INSERTION;  Surgeon: Birdie Riddle, MD;  Location: [facility];  Service: Cardiovascular;  Laterality: N/A;  . Permanent pacemaker insertion N/A 05/27/2013    Procedure: PERMANENT PACEMAKER INSERTION;  Surgeon: Coralyn Mark, MD;  Location: [facility];  Service: Cardiovascular;  Laterality: N/A;  . Pericardial tap N/A 06/11/2013    Procedure: PERICARDIAL TAP;  Surgeon: Birdie Riddle, MD;  Location: [facility];  Service: Cardiovascular;  Laterality: N/A;  . Lead revision N/A 06/13/2013    Procedure: LEAD REVISION;  Surgeon: Coralyn Mark, MD;  Location: [facility];  Service:  Cardiovascular;  Laterality: N/A;    reports that she has never smoked. She has never used smokeless tobacco. She reports that she drinks about 8.4 oz of alcohol per week. She reports that she does not use illicit drugs. family history includes Cancer in her father. Allergies  Allergen Reactions  . Codeine     GI upset  . Morphine And Related     GI upset     Review of Systems  Constitutional: Positive for fatigue.  Eyes: Negative for visual disturbance.  Respiratory: Positive for cough. Negative for chest tightness, shortness of breath and wheezing.   Cardiovascular: Negative for chest pain,  palpitations and leg swelling.  Genitourinary: Negative for dysuria.  Musculoskeletal: Positive for arthralgias.  Neurological: Negative for dizziness, seizures, syncope, weakness, light-headedness and headaches.       Objective:   Physical Exam  Constitutional: She is oriented to person, place, and time. She appears well-developed and well-nourished. No distress.  Neck: Neck supple.  Cardiovascular: Normal rate and regular rhythm.   Pulmonary/Chest: Effort normal and breath sounds normal. No respiratory distress. She has no wheezes. She has no rales.  Musculoskeletal:  Only trace nonpitting edema legs bilaterally  Neurological: She is alert and oriented to person, place, and time.  Skin:  Dry scaly hyperkeratotic skin lesion right parietal area approximately 0.5 x 1 cm. No ulceration          Assessment & Plan:  #1 hypertension. Greatly improved compared to last visit after addition of amlodipine. Continue to monitor #2 dry cough. Possibly related to lisinopril. Discontinue lisinopril. Start valsartan and 80 mg once daily #3 irritated seborrheic keratosis right scalp. Discussed risk and benefits of treatment with liquid nitrogen and patient consented. Treated without difficulty. Touch base if not resolving in the next couple of weeks #4 polymyalgia rheumatica. Symptomatically stable with recent sedimentation rate of 16. Reduce prednisone to 4 mg daily

## 2015-03-01 ENCOUNTER — Ambulatory Visit (INDEPENDENT_AMBULATORY_CARE_PROVIDER_SITE_OTHER)
Admission: RE | Admit: 2015-03-01 | Discharge: 2015-03-01 | Disposition: A | Discharge status: Home / Self Care | Payer: Medicare Other | Source: Ambulatory Visit | Attending: Family Medicine | Admitting: Family Medicine

## 2015-03-01 ENCOUNTER — Ambulatory Visit (INDEPENDENT_AMBULATORY_CARE_PROVIDER_SITE_OTHER): Payer: Medicare Other | Admitting: Family Medicine

## 2015-03-01 VITALS — BP 128/70 | HR 102 | Temp 97.9°F | Wt 177.3 lb

## 2015-03-01 DIAGNOSIS — R05 Cough: Secondary | ICD-10-CM | POA: Diagnosis not present

## 2015-03-01 DIAGNOSIS — R059 Cough, unspecified: Secondary | ICD-10-CM

## 2015-03-01 NOTE — Progress Notes (Signed)
Pre visit review using our clinic review tool, if applicable. No additional management support is needed unless otherwise documented below in the visit note. 

## 2015-03-01 NOTE — Patient Instructions (Signed)
Consider elevate head of bed 6-8 inches Avoid eating within 2-3 hours of bedtime Consider OTC antacid such as Prilosec or Nexium Consider OTC Nasacort for any allergy postnasal drip.

## 2015-03-01 NOTE — Progress Notes (Signed)
Subjective:    Patient ID: Theresa Norton, female    DOB: 12/11/1928, 81 y.o.   MRN: [id number]  HPI Patient seen as a work in with dry cough. She's actually had a cough now for over 2 months. We recently discontinued her lisinopril and switch to valsartan and the cough if anything is gotten worse. This remains dry. No fever. No chills. No hemoptysis. She is taken over-the-counter Mucinex without much change. She is not aware of any postnasal drip or wheezing. Occasional GERD symptoms. Never smoked.  Past Medical History  Diagnosis Date  . Allergy   . Hypertension   . Colon polyps   . Heart block AV second degree     a. s/p MDT Addapta dual chamber pacemaker 05/2013 by Dr Rayann Heman.  Marland Kitchen PAF (paroxysmal atrial fibrillation) ([place])     a. Found post-pacer 05/2013->Xarelto added;  b. 05/2013 Echo: EF 60-65%, mild LVH, nl wall motion w/o rwma.  . Groin hematoma     a. 05/2013 R groin hematoma post-cath - u/s 05/31/13 large 7.49 cm r inguinal hematoma extending into thigh, no psa or avf.  . Anemia   . CAD (coronary artery disease)     a. 05/2013 nonobs dzs by cath.  . Pacemaker   . H/O hiatal hernia   . Arthritis     "in my fingers" (07/08/2013)  . On home oxygen therapy     "2L maybe 12h/day" (07/08/2013)  . Pacemaker    Past Surgical History  Procedure Laterality Date  . Bunionectomy Bilateral 1967  . Breast cyst excision Right 1959; 1970's  . Insert / replace / remove pacemaker  05/2013    MDT ADDRL1 implanted by Dr Rayann Heman for heart block  . Insert / replace / remove pacemaker  06/2013    Right ventricular lead perforation with tamponade  . Cardiac catheterization    . Cataract extraction w/ intraocular lens  implant, bilateral Bilateral   . Dilation and curettage of uterus    . Varicose vein surgery Bilateral ~ 2002  . Left heart catheterization with coronary angiogram N/A 05/27/2013    Procedure: LEFT HEART CATHETERIZATION WITH CORONARY ANGIOGRAM;  Surgeon: Birdie Riddle, MD;   Location: [facility];  Service: Cardiovascular;  Laterality: N/A;  . Temporary pacemaker insertion N/A 05/27/2013    Procedure: TEMPORARY PACEMAKER INSERTION;  Surgeon: Birdie Riddle, MD;  Location: [facility];  Service: Cardiovascular;  Laterality: N/A;  . Permanent pacemaker insertion N/A 05/27/2013    Procedure: PERMANENT PACEMAKER INSERTION;  Surgeon: Coralyn Mark, MD;  Location: [facility];  Service: Cardiovascular;  Laterality: N/A;  . Pericardial tap N/A 06/11/2013    Procedure: PERICARDIAL TAP;  Surgeon: Birdie Riddle, MD;  Location: [facility];  Service: Cardiovascular;  Laterality: N/A;  . Lead revision N/A 06/13/2013    Procedure: LEAD REVISION;  Surgeon: Coralyn Mark, MD;  Location: [facility];  Service: Cardiovascular;  Laterality: N/A;    reports that she has never smoked. She has never used smokeless tobacco. She reports that she drinks about 8.4 oz of alcohol per week. She reports that she does not use illicit drugs. family history includes Cancer in her father. Allergies  Allergen Reactions  . Codeine     GI upset  . Morphine And Related     GI upset      Review of Systems  Constitutional: Negative for fever and chills.  HENT: Negative for postnasal drip.   Respiratory: Positive  for cough. Negative for chest tightness, shortness of breath and wheezing.   Cardiovascular: Negative for chest pain and leg swelling.       Objective:   Physical Exam  Constitutional: She appears well-developed and well-nourished.  HENT:  Mouth/Throat: Oropharynx is clear and moist.  Neck: Neck supple.  Cardiovascular: Normal rate and regular rhythm.   Pulmonary/Chest: Effort normal and breath sounds normal. No respiratory distress. She has no wheezes. She has no rales.  Musculoskeletal: She exhibits no edema.          Assessment & Plan:  Persistent cough. Duration now about 2 months. She does not have any red flag symptoms such as fever, weight loss, dyspnea. Nonfocal  exam. Question postnasal drip versus GERD related. Recently discontinued lisinopril. Obtain chest x-ray. Try over-the-counter Prilosec or Nexium. GERD lifestyle management discussed. Touch base and 2-3 weeks if not improving

## 2015-03-02 ENCOUNTER — Ambulatory Visit (INDEPENDENT_AMBULATORY_CARE_PROVIDER_SITE_OTHER): Payer: Medicare Other | Admitting: Nurse Practitioner

## 2015-03-02 ENCOUNTER — Encounter: Payer: Self-pay | Admitting: Nurse Practitioner

## 2015-03-02 VITALS — BP 110/60 | HR 87 | Ht 63.0 in | Wt 178.1 lb

## 2015-03-02 DIAGNOSIS — I480 Paroxysmal atrial fibrillation: Secondary | ICD-10-CM

## 2015-03-02 DIAGNOSIS — Z950 Presence of cardiac pacemaker: Secondary | ICD-10-CM | POA: Diagnosis not present

## 2015-03-02 DIAGNOSIS — I10 Essential (primary) hypertension: Secondary | ICD-10-CM | POA: Diagnosis not present

## 2015-03-02 DIAGNOSIS — I5032 Chronic diastolic (congestive) heart failure: Secondary | ICD-10-CM

## 2015-03-02 DIAGNOSIS — I4891 Unspecified atrial fibrillation: Secondary | ICD-10-CM | POA: Diagnosis not present

## 2015-03-02 LAB — CBC
HCT: 37.5 % (ref 36.0–46.0)
Hemoglobin: 12.9 g/dL (ref 12.0–15.0)
MCH: 33.5 pg (ref 26.0–34.0)
MCHC: 34.4 g/dL (ref 30.0–36.0)
MCV: 97.4 fL (ref 78.0–100.0)
MPV: 8.9 fL (ref 8.6–12.4)
Platelets: 247 10*3/uL (ref 150–400)
RBC: 3.85 MIL/uL — ABNORMAL LOW (ref 3.87–5.11)
RDW: 13 % (ref 11.5–15.5)
WBC: 7.5 10*3/uL (ref 4.0–10.5)

## 2015-03-02 LAB — BASIC METABOLIC PANEL
BUN: 22 mg/dL (ref 7–25)
CO2: 22 mmol/L (ref 20–31)
Calcium: 9.1 mg/dL (ref 8.6–10.4)
Chloride: 92 mmol/L — ABNORMAL LOW (ref 98–110)
Creat: 1.13 mg/dL — ABNORMAL HIGH (ref 0.60–0.88)
Glucose, Bld: 124 mg/dL — ABNORMAL HIGH (ref 65–99)
Potassium: 4.7 mmol/L (ref 3.5–5.3)
Sodium: 126 mmol/L — ABNORMAL LOW (ref 135–146)

## 2015-03-02 NOTE — Patient Instructions (Addendum)
We will be checking the following labs today - BMET, CBC, BNP   Medication Instructions:    Continue with your current medicines.   Take the Lasix every day for the next week and then go back to just as needed  I agree with the OTC Prilosec daily    Testing/Procedures To Be Arranged:  Echocardiogram  Follow-Up:   See Dr. Rayann Heman as planned    Other Special Instructions:   N/A  Call the [facility] office at [phone] if you have any questions, problems or concerns.

## 2015-03-02 NOTE — Progress Notes (Signed)
CARDIOLOGY OFFICE NOTE  Date:  03/02/2015    Theresa Norton Date of Birth: 02/01/1929 Medical Record #[id number]  PCP:  Eulas Post, MD  Cardiologist:  Allred    Chief Complaint  Patient presents with  . Atrial Fibrillation    Follow up visit - seen for Dr. Rayann Heman    History of Present Illness: Theresa Norton is a 81 y.o. female who presents today for a follow up visit. Apparently her primary cardiologist is Dr. Doylene Canard (just sees "as needed"?). She has PAF, has underlying PPM in place for heart block that was complicated by lead perforation/effusion and required lead repositioning, non obstructive CAD per cath in 2015, PMR, and anemia.   Last seen in April and was felt to be doing ok.   Seen by PCP yesterday for a cough.   Comes back today. Here with her son. Tells me she does not see Dr. Doylene Canard any more - her care is now thru Dr. Rayann Heman.  She now lives with her son and he manages her medicines. Off of lisinopril due to cough over the past 2 months - got switched to ARB therapy but now with more cough and congestion. Got CXR yesterday - told it was normal. Multiple complaints noted on ROS sheet - leg swelling, dyspnea, cough, depression, muscle pain, anxiety, joint swelling and balance issues. Does not want to take her Lasix. Out of Xanax and does not want to use. Bp has been good. Weight is up - she was 166 lbs 6 months ago. Not clear how much salt she is getting. No chest pain. Some swelling. Was started on OTC PPI therapy - just picked up and will be starting today.   Past Medical History  Diagnosis Date  . Allergy   . Hypertension   . Colon polyps   . Heart block AV second degree     a. s/p MDT Addapta dual chamber pacemaker 05/2013 by Dr Rayann Heman.  Marland Kitchen PAF (paroxysmal atrial fibrillation) ([place])     a. Found post-pacer 05/2013->Xarelto added;  b. 05/2013 Echo: EF 60-65%, mild LVH, nl wall motion w/o rwma.  . Groin hematoma     a. 05/2013 R groin hematoma  post-cath - u/s 05/31/13 large 7.49 cm r inguinal hematoma extending into thigh, no psa or avf.  . Anemia   . CAD (coronary artery disease)     a. 05/2013 nonobs dzs by cath.  . Pacemaker   . H/O hiatal hernia   . Arthritis     "in my fingers" (07/08/2013)  . On home oxygen therapy     "2L maybe 12h/day" (07/08/2013)  . Pacemaker     Past Surgical History  Procedure Laterality Date  . Bunionectomy Bilateral 1967  . Breast cyst excision Right 1959; 1970's  . Insert / replace / remove pacemaker  05/2013    MDT ADDRL1 implanted by Dr Rayann Heman for heart block  . Insert / replace / remove pacemaker  06/2013    Right ventricular lead perforation with tamponade  . Cardiac catheterization    . Cataract extraction w/ intraocular lens  implant, bilateral Bilateral   . Dilation and curettage of uterus    . Varicose vein surgery Bilateral ~ 2002  . Left heart catheterization with coronary angiogram N/A 05/27/2013    Procedure: LEFT HEART CATHETERIZATION WITH CORONARY ANGIOGRAM;  Surgeon: Birdie Riddle, MD;  Location: [facility];  Service: Cardiovascular;  Laterality: N/A;  . Temporary pacemaker insertion N/A 05/27/2013  Procedure: TEMPORARY PACEMAKER INSERTION;  Surgeon: Birdie Riddle, MD;  Location: [facility];  Service: Cardiovascular;  Laterality: N/A;  . Permanent pacemaker insertion N/A 05/27/2013    Procedure: PERMANENT PACEMAKER INSERTION;  Surgeon: Coralyn Mark, MD;  Location: [facility];  Service: Cardiovascular;  Laterality: N/A;  . Pericardial tap N/A 06/11/2013    Procedure: PERICARDIAL TAP;  Surgeon: Birdie Riddle, MD;  Location: [facility];  Service: Cardiovascular;  Laterality: N/A;  . Lead revision N/A 06/13/2013    Procedure: LEAD REVISION;  Surgeon: Coralyn Mark, MD;  Location: [facility];  Service: Cardiovascular;  Laterality: N/A;     Medications: Current Outpatient Prescriptions  Medication Sig Dispense Refill  . acetaminophen (TYLENOL) 325 MG tablet Take 325 mg by  mouth every 6 (six) hours as needed for mild pain.    Marland Kitchen amLODipine (NORVASC) 5 MG tablet Take 1 tablet (5 mg total) by mouth daily. 30 tablet 6  . furosemide (LASIX) 20 MG tablet Take 20 mg by mouth daily as needed (swelling).    . IRON PO Take 1 tablet by mouth daily.     . Multiple Vitamins-Minerals (PRESERVISION AREDS) TABS Take 1 capsule by mouth every other day.     . predniSONE (DELTASONE) 5 MG tablet Take 1 tablet (5 mg total) by mouth daily. 30 tablet 5  . valsartan (DIOVAN) 80 MG tablet Take 1 tablet (80 mg total) by mouth daily. 30 tablet 11   No current facility-administered medications for this visit.    Allergies: Allergies  Allergen Reactions  . Codeine     GI upset  . Morphine And Related     GI upset    Social History: The patient  reports that she has never smoked. She has never used smokeless tobacco. She reports that she drinks about 8.4 oz of alcohol per week. She reports that she does not use illicit drugs.   Family History: The patient's family history includes Cancer in her father.   Review of Systems: Please see the history of present illness.   Otherwise, the review of systems is positive for none.   All other systems are reviewed and negative.   Physical Exam: VS:  BP 110/60 mmHg  Pulse 87  Ht 5\' 3"  (1.6 m)  Wt 178 lb 1.9 oz (80.795 kg)  BMI 31.56 kg/m2  SpO2 99% .  BMI Body mass index is 31.56 kg/(m^2).  Wt Readings from Last 3 Encounters:  03/02/15 178 lb 1.9 oz (80.795 kg)  03/01/15 177 lb 4.8 oz (80.423 kg)  02/22/15 178 lb 4.8 oz (80.876 kg)    General: Pleasant. Elderly female who is in no acute distress.  HEENT: Normal. Neck: Supple, no JVD, carotid bruits, or masses noted.  Cardiac: Regular rate and rhythm. Outflow murmur noted. +1 ankle edema.  Respiratory:  Lungs are clear to auscultation bilaterally with normal work of breathing.  GI: Soft and nontender.  MS: No deformity or atrophy. Gait and ROM intact. Skin: Warm and dry. Color  is normal.  Neuro:  Strength and sensation are intact and no gross focal deficits noted.  Psych: Alert, appropriate and with normal affect.   LABORATORY DATA:  EKG:  EKG is not ordered today.  Lab Results  Component Value Date   WBC 4.5 10/12/2014   HGB 13.6 10/12/2014   HCT 39.4 10/12/2014   PLT 279.0 10/12/2014   GLUCOSE 93 10/12/2014   CHOL 232* 04/17/2012   TRIG 136.0 04/17/2012  HDL 96.80 04/17/2012   LDLDIRECT 121.9 04/17/2012   ALT 16 07/07/2013   AST 16 07/07/2013   NA 133* 10/12/2014   K 4.5 10/12/2014   CL 98 10/12/2014   CREATININE 0.94 10/12/2014   BUN 15 10/12/2014   CO2 29 10/12/2014   TSH 1.65 10/12/2014   INR 1.20 06/07/2013    BNP (last 3 results) No results for input(s): BNP in the last 8760 hours.  ProBNP (last 3 results) No results for input(s): PROBNP in the last 8760 hours.   Other Studies Reviewed Today:  Echo Study Conclusions from 09/2013  - Left ventricle: The cavity size was normal. Wall thickness was increased in a pattern of mild LVH. Systolic function was normal. The estimated ejection fraction was in the range of 55% to 60%. Wall motion was normal; there were no regional wall motion abnormalities. Doppler parameters are consistent with abnormal left ventricular relaxation (grade 1 diastolic dysfunction). - Ventricular septum: Septal motion showed paradox. - Aortic valve: Valve mobility was restricted. - Ascending aorta: The ascending aorta was mildly dilated. - Mitral valve: Calcified annulus. Mild regurgitation. - Left atrium: The atrium was mildly dilated. Impressions:  - Compared to 06/23/13, pericardial effusion has resolved.   Assessment/Plan: 1. Underlying PPM - has had recent remote check - will make sure transmission received.   2. PAF - appears to be in sinus by exam. Checking recent remote - less than 1% AF noted - she is not on anticoagulation - not really clear as to why not - I suspect this was due  to past bleeding/hematoma. Would recommend aspirin 81 mg at least.   3. CAD - nonobstructive per cath from 2015 - would advise aspirin 81 mg. No symptoms reported.   4. Cough - may be from diastolic HF/GERD - agree with the PPI therapy. Would ask her to take her lasix daily for at least the next week.   5. Diastolic HF - will check her labs today to include BNP. Update her echo. Her weight is up from 166 from April. Not clear how much salt she is getting.   Current medicines are reviewed with the patient today.  The patient does not have concerns regarding medicines other than what has been noted above.  The following changes have been made:  See above.  Labs/ tests ordered today include:    Orders Placed This Encounter  Procedures  . Brain natriuretic peptide  . Basic metabolic panel  . CBC  . ECHOCARDIOGRAM COMPLETE     Disposition:   FU with Dr. Rayann Heman as planned unless her studies are abnormal.     Patient is agreeable to this plan and will call if any problems develop in the interim.   Signed: Burtis Junes, RN, ANP-C 03/02/2015 2:10 PM  [facility] [address] [place], [place]  [address] Phone: [phone] Fax: [phone]

## 2015-03-03 ENCOUNTER — Other Ambulatory Visit: Payer: Self-pay | Admitting: *Deleted

## 2015-03-03 DIAGNOSIS — E871 Hypo-osmolality and hyponatremia: Secondary | ICD-10-CM

## 2015-03-03 LAB — BRAIN NATRIURETIC PEPTIDE: Brain Natriuretic Peptide: 37.9 pg/mL (ref 0.0–100.0)

## 2015-03-04 ENCOUNTER — Telehealth: Payer: Self-pay | Admitting: Nurse Practitioner

## 2015-03-04 DIAGNOSIS — H353212 Exudative age-related macular degeneration, right eye, with inactive choroidal neovascularization: Secondary | ICD-10-CM | POA: Diagnosis not present

## 2015-03-04 DIAGNOSIS — Z961 Presence of intraocular lens: Secondary | ICD-10-CM | POA: Diagnosis not present

## 2015-03-04 DIAGNOSIS — H353290 Exudative age-related macular degeneration, unspecified eye, stage unspecified: Secondary | ICD-10-CM | POA: Diagnosis not present

## 2015-03-04 DIAGNOSIS — H353122 Nonexudative age-related macular degeneration, left eye, intermediate dry stage: Secondary | ICD-10-CM | POA: Diagnosis not present

## 2015-03-04 NOTE — Telephone Encounter (Signed)
Pt's son wants to know why pt needs to come again for blood work. Pt okay for [place] to speak to son. Son is aware that pt's last  lab work shows that pt has low sodium, NP recommends for pt to cut back on fluid intake, and return in a week to see if her sodium intake goes up. Son verbalized understanding.

## 2015-03-04 NOTE — Telephone Encounter (Signed)
New Message  Pt son wanted to know why another lab draw BMET was sched for 11/1. Please call back and discuss.

## 2015-03-05 ENCOUNTER — Ambulatory Visit (HOSPITAL_COMMUNITY): Payer: Medicare Other | Attending: Cardiology

## 2015-03-05 ENCOUNTER — Other Ambulatory Visit: Payer: Self-pay

## 2015-03-05 DIAGNOSIS — I517 Cardiomegaly: Secondary | ICD-10-CM | POA: Insufficient documentation

## 2015-03-05 DIAGNOSIS — I5189 Other ill-defined heart diseases: Secondary | ICD-10-CM | POA: Diagnosis not present

## 2015-03-05 DIAGNOSIS — I5032 Chronic diastolic (congestive) heart failure: Secondary | ICD-10-CM | POA: Diagnosis not present

## 2015-03-05 DIAGNOSIS — I340 Nonrheumatic mitral (valve) insufficiency: Secondary | ICD-10-CM | POA: Insufficient documentation

## 2015-03-05 DIAGNOSIS — I77810 Thoracic aortic ectasia: Secondary | ICD-10-CM | POA: Insufficient documentation

## 2015-03-05 DIAGNOSIS — I480 Paroxysmal atrial fibrillation: Secondary | ICD-10-CM

## 2015-03-05 DIAGNOSIS — Z950 Presence of cardiac pacemaker: Secondary | ICD-10-CM

## 2015-03-05 DIAGNOSIS — I10 Essential (primary) hypertension: Secondary | ICD-10-CM | POA: Insufficient documentation

## 2015-03-05 DIAGNOSIS — I5031 Acute diastolic (congestive) heart failure: Secondary | ICD-10-CM | POA: Insufficient documentation

## 2015-03-07 ENCOUNTER — Other Ambulatory Visit: Payer: Self-pay | Admitting: Family Medicine

## 2015-03-08 ENCOUNTER — Telehealth: Payer: Self-pay | Admitting: Nurse Practitioner

## 2015-03-08 NOTE — Telephone Encounter (Signed)
Its ok to refill it?

## 2015-03-08 NOTE — Telephone Encounter (Signed)
New message    Son calling back for test results - blood test & echo

## 2015-03-08 NOTE — Telephone Encounter (Signed)
Pt gave me permission to speak with her son. I spoke with pt's son, questions answered about echo results and lab appt.

## 2015-03-09 ENCOUNTER — Other Ambulatory Visit (INDEPENDENT_AMBULATORY_CARE_PROVIDER_SITE_OTHER): Payer: Medicare Other | Admitting: *Deleted

## 2015-03-09 DIAGNOSIS — E871 Hypo-osmolality and hyponatremia: Secondary | ICD-10-CM

## 2015-03-09 LAB — BASIC METABOLIC PANEL
BUN: 23 mg/dL (ref 7–25)
CO2: 22 mmol/L (ref 20–31)
Calcium: 8.7 mg/dL (ref 8.6–10.4)
Chloride: 96 mmol/L — ABNORMAL LOW (ref 98–110)
Creat: 0.96 mg/dL — ABNORMAL HIGH (ref 0.60–0.88)
Glucose, Bld: 112 mg/dL — ABNORMAL HIGH (ref 65–99)
Potassium: 4.2 mmol/L (ref 3.5–5.3)
Sodium: 129 mmol/L — ABNORMAL LOW (ref 135–146)

## 2015-03-10 ENCOUNTER — Telehealth: Payer: Self-pay

## 2015-03-10 DIAGNOSIS — I10 Essential (primary) hypertension: Secondary | ICD-10-CM

## 2015-03-10 DIAGNOSIS — I4891 Unspecified atrial fibrillation: Secondary | ICD-10-CM

## 2015-03-10 NOTE — Telephone Encounter (Signed)
-----   Message from Burtis Junes, NP sent at 03/10/2015  7:51 AM EDT ----- Ok to report. Would like for her to continue with her fluid restriction.  Recheck BMET in one month

## 2015-03-10 NOTE — Telephone Encounter (Signed)
DPR on file. Pt son aware of lab results and Tera Helper, NP recommendation. Ok to report. Would like for her to continue with her fluid restriction. Recheck BMET in one month. Lab appt scheduled for 12/1. Pt son verbalized understanding.

## 2015-03-11 NOTE — Telephone Encounter (Signed)
Okay to refill for one more month. After this month if no increase in muscle aches, patient needs to reduce to 3 mg daily.

## 2015-04-07 DIAGNOSIS — I442 Atrioventricular block, complete: Secondary | ICD-10-CM | POA: Diagnosis not present

## 2015-04-07 DIAGNOSIS — Z789 Other specified health status: Secondary | ICD-10-CM | POA: Insufficient documentation

## 2015-04-07 DIAGNOSIS — R0600 Dyspnea, unspecified: Secondary | ICD-10-CM | POA: Insufficient documentation

## 2015-04-07 DIAGNOSIS — I4891 Unspecified atrial fibrillation: Secondary | ICD-10-CM | POA: Diagnosis not present

## 2015-04-08 ENCOUNTER — Other Ambulatory Visit (INDEPENDENT_AMBULATORY_CARE_PROVIDER_SITE_OTHER): Payer: Medicare Other | Admitting: *Deleted

## 2015-04-08 ENCOUNTER — Other Ambulatory Visit: Payer: Self-pay | Admitting: Family Medicine

## 2015-04-08 DIAGNOSIS — I10 Essential (primary) hypertension: Secondary | ICD-10-CM | POA: Diagnosis not present

## 2015-04-22 ENCOUNTER — Encounter: Payer: Self-pay | Admitting: Family Medicine

## 2015-04-22 ENCOUNTER — Ambulatory Visit (INDEPENDENT_AMBULATORY_CARE_PROVIDER_SITE_OTHER): Payer: Medicare Other | Admitting: Family Medicine

## 2015-04-22 VITALS — BP 136/80 | HR 92 | Temp 97.6°F | Resp 14 | Ht 63.0 in | Wt 182.3 lb

## 2015-04-22 DIAGNOSIS — M353 Polymyalgia rheumatica: Secondary | ICD-10-CM | POA: Diagnosis not present

## 2015-04-22 LAB — SEDIMENTATION RATE: Sed Rate: 18 mm/hr (ref 0–22)

## 2015-04-22 MED ORDER — PREDNISONE 1 MG PO TABS: 1.0000 mg | ORAL_TABLET | Freq: Every day | ORAL | Status: DC

## 2015-04-22 NOTE — Progress Notes (Signed)
Subjective:    Patient ID: Theresa Norton, female    DOB: 1928-06-15, 81 y.o.   MRN: [id number]  HPI Follow-up polymyalgia. Symptomatically stable. We recommended last visit she taper down to 4 mg but apparently she has been taking a half of a 10 mg tablet. Her sedimentation rates have been consistently stable. She denies any neck or upper back pain.  Blood pressures been stable. She's had some mild peripheral edema possibly related to recent amlodipine. Denies any chest pains.  No dyspnea.  Past Medical History  Diagnosis Date  . Allergy   . Hypertension   . Colon polyps   . Heart block AV second degree     a. s/p MDT Addapta dual chamber pacemaker 05/2013 by Dr Rayann Heman.  Marland Kitchen PAF (paroxysmal atrial fibrillation) ([place])     a. Found post-pacer 05/2013->Xarelto added;  b. 05/2013 Echo: EF 60-65%, mild LVH, nl wall motion w/o rwma.  . Groin hematoma     a. 05/2013 R groin hematoma post-cath - u/s 05/31/13 large 7.49 cm r inguinal hematoma extending into thigh, no psa or avf.  . Anemia   . CAD (coronary artery disease)     a. 05/2013 nonobs dzs by cath.  . Pacemaker   . H/O hiatal hernia   . Arthritis     "in my fingers" (07/08/2013)  . On home oxygen therapy     "2L maybe 12h/day" (07/08/2013)  . Pacemaker    Past Surgical History  Procedure Laterality Date  . Bunionectomy Bilateral 1967  . Breast cyst excision Right 1959; 1970's  . Insert / replace / remove pacemaker  05/2013    MDT ADDRL1 implanted by Dr Rayann Heman for heart block  . Insert / replace / remove pacemaker  06/2013    Right ventricular lead perforation with tamponade  . Cardiac catheterization    . Cataract extraction w/ intraocular lens  implant, bilateral Bilateral   . Dilation and curettage of uterus    . Varicose vein surgery Bilateral ~ 2002  . Left heart catheterization with coronary angiogram N/A 05/27/2013    Procedure: LEFT HEART CATHETERIZATION WITH CORONARY ANGIOGRAM;  Surgeon: Birdie Riddle, MD;  Location: [facility]  C[facility];  Service: Cardiovascular;  Laterality: N/A;  . Temporary pacemaker insertion N/A 05/27/2013    Procedure: TEMPORARY PACEMAKER INSERTION;  Surgeon: Birdie Riddle, MD;  Location: [facility];  Service: Cardiovascular;  Laterality: N/A;  . Permanent pacemaker insertion N/A 05/27/2013    Procedure: PERMANENT PACEMAKER INSERTION;  Surgeon: Coralyn Mark, MD;  Location: [facility];  Service: Cardiovascular;  Laterality: N/A;  . Pericardial tap N/A 06/11/2013    Procedure: PERICARDIAL TAP;  Surgeon: Birdie Riddle, MD;  Location: [facility];  Service: Cardiovascular;  Laterality: N/A;  . Lead revision N/A 06/13/2013    Procedure: LEAD REVISION;  Surgeon: Coralyn Mark, MD;  Location: [facility];  Service: Cardiovascular;  Laterality: N/A;    reports that she has never smoked. She has never used smokeless tobacco. She reports that she drinks about 8.4 oz of alcohol per week. She reports that she does not use illicit drugs. family history includes Cancer in her father. Allergies  Allergen Reactions  . Codeine     GI upset  . Morphine And Related     GI upset      Review of Systems  Constitutional: Negative for fatigue.  Eyes: Negative for visual disturbance.  Respiratory: Negative for cough, chest tightness, shortness of  breath and wheezing.   Cardiovascular: Positive for leg swelling (Trace leg edema bilaterally). Negative for chest pain and palpitations.  Musculoskeletal: Negative for myalgias.  Neurological: Negative for dizziness, seizures, syncope, weakness, light-headedness and headaches.       Objective:   Physical Exam  Constitutional: She appears well-developed and well-nourished.  Neck: No JVD present.  Cardiovascular: Normal rate and regular rhythm.   Pulmonary/Chest: Effort normal and breath sounds normal. No respiratory distress. She has no wheezes. She has no rales.  Musculoskeletal: She exhibits edema.          Assessment & Plan:  Polymyalgia rheumatica.  Recheck sedimentation rate. If stable, continue 1 mg increment taper and should hopefully have her off prednisone completely in a few months.

## 2015-04-22 NOTE — Progress Notes (Signed)
Pre visit review using our clinic review tool, if applicable. No additional management support is needed unless otherwise documented below in the visit note. 

## 2015-04-22 NOTE — Patient Instructions (Signed)
IF sed rate remains stable, start prednisone 4 mg daily for one month and then decrease by one mg each month until down to 2mg  At 2 mg daily may stop if no symptom breakthrough.

## 2015-05-13 DIAGNOSIS — R609 Edema, unspecified: Secondary | ICD-10-CM | POA: Diagnosis not present

## 2015-05-13 DIAGNOSIS — Z789 Other specified health status: Secondary | ICD-10-CM | POA: Diagnosis not present

## 2015-05-13 DIAGNOSIS — I4891 Unspecified atrial fibrillation: Secondary | ICD-10-CM | POA: Diagnosis not present

## 2015-05-13 DIAGNOSIS — I442 Atrioventricular block, complete: Secondary | ICD-10-CM | POA: Diagnosis not present

## 2015-05-13 DIAGNOSIS — I441 Atrioventricular block, second degree: Secondary | ICD-10-CM | POA: Diagnosis not present

## 2015-05-17 ENCOUNTER — Ambulatory Visit (INDEPENDENT_AMBULATORY_CARE_PROVIDER_SITE_OTHER): Payer: Medicare Other | Admitting: Internal Medicine

## 2015-05-17 ENCOUNTER — Encounter: Payer: Self-pay | Admitting: Internal Medicine

## 2015-05-17 VITALS — BP 140/82 | HR 85 | Ht 63.5 in | Wt 181.6 lb

## 2015-05-17 DIAGNOSIS — I442 Atrioventricular block, complete: Secondary | ICD-10-CM | POA: Diagnosis not present

## 2015-05-17 DIAGNOSIS — I10 Essential (primary) hypertension: Secondary | ICD-10-CM

## 2015-05-17 MED ORDER — AMLODIPINE BESYLATE 2.5 MG PO TABS: 2.5000 mg | ORAL_TABLET | Freq: Every day | ORAL | Status: DC

## 2015-05-17 NOTE — Patient Instructions (Addendum)
Medication Instructions:  Your physician has recommended you make the following change in your medication:  1) Decrease Amlodipine to 2.5 mg daily    Labwork: None ordered   Testing/Procedures: None ordered   Follow-Up: Remote monitoring is used to monitor your Pacemaker from home. This monitoring reduces the number of office visits required to check your device to one time per year. It allows [place] to keep an eye on the functioning of your device to ensure it is working properly. You are scheduled for a device check from home on 08/16/15. You may send your transmission at any time that day. If you have a wireless device, the transmission will be sent automatically. After your physician reviews your transmission, you will receive a postcard with your next transmission date.  Your physician recommends that you schedule a follow-up appointment in: 4 months with Truitt Merle, NP    Your physician wants you to follow-up in: 12 months with Dr Vallery Ridge will receive a reminder letter in the mail two months in advance. If you don't receive a letter, please call our office to schedule the follow-up appointment.    Low-Sodium Eating Plan Sodium raises blood pressure and causes water to be held in the body. Getting less sodium from food will help lower your blood pressure, reduce any swelling, and protect your heart, liver, and kidneys. We get sodium by adding salt (sodium chloride) to food. Most of our sodium comes from canned, boxed, and frozen foods. Restaurant foods, fast foods, and pizza are also very high in sodium. Even if you take medicine to lower your blood pressure or to reduce fluid in your body, getting less sodium from your food is important. WHAT IS MY PLAN? Most people should limit their sodium intake to 2,300 mg a day. Your health care provider recommends that you limit your sodium intake to 2 grams a day.  WHAT DO I NEED TO KNOW ABOUT THIS EATING PLAN? For the low-sodium eating  plan, you will follow these general guidelines:  Choose foods with a % Daily Value for sodium of less than 5% (as listed on the food label).   Use salt-free seasonings or herbs instead of table salt or sea salt.   Check with your health care provider or pharmacist before using salt substitutes.   Eat fresh foods.  Eat more vegetables and fruits.  Limit canned vegetables. If you do use them, rinse them well to decrease the sodium.   Limit cheese to 1 oz (28 g) per day.   Eat lower-sodium products, often labeled as "lower sodium" or "no salt added."  Avoid foods that contain monosodium glutamate (MSG). MSG is sometimes added to [place] food and some canned foods.  Check food labels (Nutrition Facts labels) on foods to learn how much sodium is in one serving.  Eat more home-cooked food and less restaurant, buffet, and fast food.  When eating at a restaurant, ask that your food be prepared with less salt, or no salt if possible.  HOW DO I READ FOOD LABELS FOR SODIUM INFORMATION? The Nutrition Facts label lists the amount of sodium in one serving of the food. If you eat more than one serving, you must multiply the listed amount of sodium by the number of servings. Food labels may also identify foods as:  Sodium free--Less than 5 mg in a serving.  Very low sodium--35 mg or less in a serving.  Low sodium--140 mg or less in a serving.  Light in sodium--50% less  sodium in a serving. For example, if a food that usually has 300 mg of sodium is changed to become light in sodium, it will have 150 mg of sodium.  Reduced sodium--25% less sodium in a serving. For example, if a food that usually has 400 mg of sodium is changed to reduced sodium, it will have 300 mg of sodium. WHAT FOODS CAN I EAT? Grains Low-sodium cereals, including oats, puffed wheat and rice, and shredded wheat cereals. Low-sodium crackers. Unsalted rice and pasta. Lower-sodium bread.  Vegetables Frozen or  fresh vegetables. Low-sodium or reduced-sodium canned vegetables. Low-sodium or reduced-sodium tomato sauce and paste. Low-sodium or reduced-sodium tomato and vegetable juices.  Fruits Fresh, frozen, and canned fruit. Fruit juice.  Meat and Other Protein Products Low-sodium canned tuna and salmon. Fresh or frozen meat, poultry, seafood, and fish. Lamb. Unsalted nuts. Dried beans, peas, and lentils without added salt. Unsalted canned beans. Homemade soups without salt. Eggs.  Dairy Milk. Soy milk. Ricotta cheese. Low-sodium or reduced-sodium cheeses. Yogurt.  Condiments Fresh and dried herbs and spices. Salt-free seasonings. Onion and garlic powders. Low-sodium varieties of mustard and ketchup. Fresh or refrigerated horseradish. Lemon juice.  Fats and Oils Reduced-sodium salad dressings. Unsalted butter.  Other Unsalted popcorn and pretzels.  The items listed above may not be a complete list of recommended foods or beverages. Contact your dietitian for more options. WHAT FOODS ARE NOT RECOMMENDED? Grains Instant hot cereals. Bread stuffing, pancake, and biscuit mixes. Croutons. Seasoned rice or pasta mixes. Noodle soup cups. Boxed or frozen macaroni and cheese. Self-rising flour. Regular salted crackers. Vegetables Regular canned vegetables. Regular canned tomato sauce and paste. Regular tomato and vegetable juices. Frozen vegetables in sauces. Salted [place] fries. Olives. Angie Fava. Relishes. Sauerkraut. Salsa. Meat and Other Protein Products Salted, canned, smoked, spiced, or pickled meats, seafood, or fish. Bacon, ham, sausage, hot dogs, corned beef, chipped beef, and packaged luncheon meats. Salt pork. Jerky. Pickled herring. Anchovies, regular canned tuna, and sardines. Salted nuts. Dairy Processed cheese and cheese spreads. Cheese curds. Blue cheese and cottage cheese. Buttermilk.  Condiments Onion and garlic salt, seasoned salt, table salt, and sea salt. Canned and packaged  gravies. Worcestershire sauce. Tartar sauce. Barbecue sauce. Teriyaki sauce. Soy sauce, including reduced sodium. Steak sauce. Fish sauce. Oyster sauce. Cocktail sauce. Horseradish that you find on the shelf. Regular ketchup and mustard. Meat flavorings and tenderizers. Bouillon cubes. Hot sauce. Tabasco sauce. Marinades. Taco seasonings. Relishes. Fats and Oils Regular salad dressings. Salted butter. Margarine. Ghee. Bacon fat.  Other Potato and tortilla chips. Corn chips and puffs. Salted popcorn and pretzels. Canned or dried soups. Pizza. Frozen entrees and pot pies.  The items listed above may not be a complete list of foods and beverages to avoid. Contact your dietitian for more information.   This information is not intended to replace advice given to you by your health care provider. Make sure you discuss any questions you have with your health care provider.   Document Released: 10/14/2001 Document Revised: 05/15/2014 Document Reviewed: 02/26/2013 Elsevier Interactive Patient Education [employer].      If you need a refill on your cardiac medications before your next appointment, please call your pharmacy.

## 2015-05-17 NOTE — Progress Notes (Signed)
PCP: Eulas Post, MD  Theresa Norton is a 82 y.o. female who presents today for electrophysiology followup.   She has done reasonably well since I saw her last.  She has PMR and is on chronic steroids.  These are being weaned by Dr Elease Hashimoto.  Her son notes that her energy was "much better" on high dose steroids but is now descreased.  She has chronic edema.   Today, she denies symptoms of palpitations, chest pain,  lower extremity edema, presyncope, or syncope.  The patient is otherwise without complaint today.   Past Medical History  Diagnosis Date  . Allergy   . Hypertension   . Colon polyps   . Heart block AV second degree     a. s/p MDT Addapta dual chamber pacemaker 05/2013 by Dr Rayann Heman.  Marland Kitchen PAF (paroxysmal atrial fibrillation) ([place])     a. Found post-pacer 05/2013->Xarelto added;  b. 05/2013 Echo: EF 60-65%, mild LVH, nl wall motion w/o rwma.  . Groin hematoma     a. 05/2013 R groin hematoma post-cath - u/s 05/31/13 large 7.49 cm r inguinal hematoma extending into thigh, no psa or avf.  . Anemia   . CAD (coronary artery disease)     a. 05/2013 nonobs dzs by cath.  . Pacemaker   . H/O hiatal hernia   . Arthritis     "in my fingers" (07/08/2013)  . On home oxygen therapy     "2L maybe 12h/day" (07/08/2013)  . Pacemaker    Past Surgical History  Procedure Laterality Date  . Bunionectomy Bilateral 1967  . Breast cyst excision Right 1959; 1970's  . Insert / replace / remove pacemaker  05/2013    MDT ADDRL1 implanted by Dr Rayann Heman for heart block  . Insert / replace / remove pacemaker  06/2013    Right ventricular lead perforation with tamponade  . Cardiac catheterization    . Cataract extraction w/ intraocular lens  implant, bilateral Bilateral   . Dilation and curettage of uterus    . Varicose vein surgery Bilateral ~ 2002  . Left heart catheterization with coronary angiogram N/A 05/27/2013    Procedure: LEFT HEART CATHETERIZATION WITH CORONARY ANGIOGRAM;  Surgeon: Birdie Riddle, MD;  Location: [facility];  Service: Cardiovascular;  Laterality: N/A;  . Temporary pacemaker insertion N/A 05/27/2013    Procedure: TEMPORARY PACEMAKER INSERTION;  Surgeon: Birdie Riddle, MD;  Location: [facility];  Service: Cardiovascular;  Laterality: N/A;  . Permanent pacemaker insertion N/A 05/27/2013    Procedure: PERMANENT PACEMAKER INSERTION;  Surgeon: Coralyn Mark, MD;  Location: [facility];  Service: Cardiovascular;  Laterality: N/A;  . Pericardial tap N/A 06/11/2013    Procedure: PERICARDIAL TAP;  Surgeon: Birdie Riddle, MD;  Location: [facility];  Service: Cardiovascular;  Laterality: N/A;  . Lead revision N/A 06/13/2013    Procedure: LEAD REVISION;  Surgeon: Coralyn Mark, MD;  Location: [facility];  Service: Cardiovascular;  Laterality: N/A;    Current Outpatient Prescriptions  Medication Sig Dispense Refill  . acetaminophen (TYLENOL) 325 MG tablet Take 325 mg by mouth every 6 (six) hours as needed for mild pain.    Marland Kitchen amLODipine (NORVASC) 2.5 MG tablet Take 1 tablet (2.5 mg total) by mouth daily. 90 tablet 3  . furosemide (LASIX) 20 MG tablet Take 20 mg by mouth daily as needed (swelling).    . potassium chloride (K-DUR,KLOR-CON) 10 MEQ tablet Take 10 mEq by mouth daily.    Marland Kitchen  predniSONE (DELTASONE) 1 MG tablet Take 1 tablet (1 mg total) by mouth daily with breakfast. 200 tablet 1  . valsartan (DIOVAN) 80 MG tablet Take 1 tablet (80 mg total) by mouth daily. 30 tablet 11   No current facility-administered medications for this visit.   ROS-  See HPI, all other systems are reviewed and negative except as per HPI above  Physical Exam: Filed Vitals:   05/17/15 1412  BP: 140/82  Pulse: 85  Height: 5' 3.5" (1.613 m)  Weight: 181 lb 9.6 oz (82.373 kg)    GEN- The patient is elderly appearing, alert and oriented x 3 today.   Head- normocephalic, atraumatic Eyes-  Sclera clear, conjunctiva pink Ears- hearing intact Oropharynx- clear Lungs- Clear to ausculation  bilaterally, normal work of breathing Chest- pacemaker pocket is well healed Heart- Regular rate and rhythm, no murmurs, rubs or gallops, PMI not laterally displaced GI- soft, NT, ND, + BS Extremities- no clubbing, cyanosis, trace edema  Pacemaker interrogation- Pacemaker not interrogated today  Assessment and Plan:  1. Complete heart block Normal pacemaker function See Pace Art report No changes today  2. edema A chronic issue Support hose advised 2 gram sodium restriction Will decrease norvasc to 2.5mg  daily  3. Hypertension. Stable No change required today  Return to see Cecille Rubin in 4 months Return to see me in 1 year carelink  Thompson Grayer MD, [facility] 05/17/2015 5:04 PM

## 2015-05-28 ENCOUNTER — Ambulatory Visit (INDEPENDENT_AMBULATORY_CARE_PROVIDER_SITE_OTHER): Payer: Medicare Other | Admitting: Family Medicine

## 2015-05-28 ENCOUNTER — Encounter: Payer: Self-pay | Admitting: Family Medicine

## 2015-05-28 VITALS — BP 110/70 | HR 104 | Temp 98.4°F | Ht 63.5 in | Wt 184.7 lb

## 2015-05-28 DIAGNOSIS — R05 Cough: Secondary | ICD-10-CM

## 2015-05-28 DIAGNOSIS — R059 Cough, unspecified: Secondary | ICD-10-CM

## 2015-05-28 MED ORDER — CEFUROXIME AXETIL 250 MG PO TABS: 250.0000 mg | ORAL_TABLET | Freq: Two times a day (BID) | ORAL | Status: DC

## 2015-05-28 NOTE — Progress Notes (Signed)
Pre visit review using our clinic review tool, if applicable. No additional management support is needed unless otherwise documented below in the visit note. 

## 2015-05-28 NOTE — Patient Instructions (Signed)
Follow up for any increased shortness of breath or increasing fevers.

## 2015-05-28 NOTE — Progress Notes (Signed)
Subjective:    Patient ID: Theresa Norton, female    DOB: [birth year], 82 y.o.   MRN: [id number]  HPI  Patient seen with cough. Onset a few days ago. She's had some nasal congestion. Daughter apparently had similar symptoms last week. No fever. She does complain of some general body aches. No nausea or vomiting. Took some Aleve which helped her body aches. Tried Mucinex DM which helped her cough slightly.  In process of tapering off prednisone and currently down to 1 mg daily for her polymyalgia rheumatica. Her other medical problems include history of hypertension, heart block, atrial fibrillation.  Past Medical History  Diagnosis Date  . Allergy   . Hypertension   . Colon polyps   . Heart block AV second degree     a. s/p MDT Addapta dual chamber pacemaker 05/2013 by Dr Rayann Heman.  Marland Kitchen PAF (paroxysmal atrial fibrillation) ([place])     a. Found post-pacer 05/2013->Xarelto added;  b. 05/2013 Echo: EF 60-65%, mild LVH, nl wall motion w/o rwma.  . Groin hematoma     a. 05/2013 R groin hematoma post-cath - u/s 05/31/13 large 7.49 cm r inguinal hematoma extending into thigh, no psa or avf.  . Anemia   . CAD (coronary artery disease)     a. 05/2013 nonobs dzs by cath.  . Pacemaker   . H/O hiatal hernia   . Arthritis     "in my fingers" (07/08/2013)  . On home oxygen therapy     "2L maybe 12h/day" (07/08/2013)  . Pacemaker    Past Surgical History  Procedure Laterality Date  . Bunionectomy Bilateral 1967  . Breast cyst excision Right 1959; 1970's  . Insert / replace / remove pacemaker  05/2013    MDT ADDRL1 implanted by Dr Rayann Heman for heart block  . Insert / replace / remove pacemaker  06/2013    Right ventricular lead perforation with tamponade  . Cardiac catheterization    . Cataract extraction w/ intraocular lens  implant, bilateral Bilateral   . Dilation and curettage of uterus    . Varicose vein surgery Bilateral ~ 2002  . Left heart catheterization with coronary angiogram N/A 05/27/2013   Procedure: LEFT HEART CATHETERIZATION WITH CORONARY ANGIOGRAM;  Surgeon: Birdie Riddle, MD;  Location: [facility];  Service: Cardiovascular;  Laterality: N/A;  . Temporary pacemaker insertion N/A 05/27/2013    Procedure: TEMPORARY PACEMAKER INSERTION;  Surgeon: Birdie Riddle, MD;  Location: [facility];  Service: Cardiovascular;  Laterality: N/A;  . Permanent pacemaker insertion N/A 05/27/2013    Procedure: PERMANENT PACEMAKER INSERTION;  Surgeon: Coralyn Mark, MD;  Location: [facility];  Service: Cardiovascular;  Laterality: N/A;  . Pericardial tap N/A 06/11/2013    Procedure: PERICARDIAL TAP;  Surgeon: Birdie Riddle, MD;  Location: [facility];  Service: Cardiovascular;  Laterality: N/A;  . Lead revision N/A 06/13/2013    Procedure: LEAD REVISION;  Surgeon: Coralyn Mark, MD;  Location: [facility];  Service: Cardiovascular;  Laterality: N/A;    reports that she has never smoked. She has never used smokeless tobacco. She reports that she drinks about 8.4 oz of alcohol per week. She reports that she does not use illicit drugs. family history includes Cancer in her father. Allergies  Allergen Reactions  . Codeine     GI upset  . Morphine And Related     GI upset     Review of Systems  Constitutional: Negative for fever and  chills.  Respiratory: Positive for cough. Negative for wheezing.   Cardiovascular: Negative for chest pain and palpitations.       Objective:   Physical Exam  Constitutional: She appears well-developed and well-nourished.  HENT:  Mouth/Throat: Oropharynx is clear and moist.  Neck: Neck supple.  Cardiovascular: Normal rate and regular rhythm.   Pulmonary/Chest: Effort normal. She has no wheezes.  She had some faint crackles in her left base which cleared slightly with deep breathing          Assessment & Plan:  Cough. Probable viral bronchitis. She did have some mild crackles left base which cleared somewhat with coughing and deep breathing which  suggests this may be atelectasis. With her multiple chronic problems and age will cover with Ceftin 250 mg twice daily for 7 days. Stay well-hydrated. Follow-up promptly for any fever or increased shortness of breath

## 2015-07-12 DIAGNOSIS — Z950 Presence of cardiac pacemaker: Secondary | ICD-10-CM | POA: Diagnosis not present

## 2015-07-12 DIAGNOSIS — I442 Atrioventricular block, complete: Secondary | ICD-10-CM | POA: Diagnosis not present

## 2015-07-21 ENCOUNTER — Encounter: Payer: Self-pay | Admitting: Family Medicine

## 2015-07-21 ENCOUNTER — Ambulatory Visit (INDEPENDENT_AMBULATORY_CARE_PROVIDER_SITE_OTHER): Payer: Medicare Other | Admitting: Family Medicine

## 2015-07-21 VITALS — BP 120/80 | HR 103 | Temp 97.7°F | Ht 63.5 in | Wt 180.8 lb

## 2015-07-21 DIAGNOSIS — E871 Hypo-osmolality and hyponatremia: Secondary | ICD-10-CM

## 2015-07-21 DIAGNOSIS — M353 Polymyalgia rheumatica: Secondary | ICD-10-CM | POA: Diagnosis not present

## 2015-07-21 DIAGNOSIS — I10 Essential (primary) hypertension: Secondary | ICD-10-CM | POA: Diagnosis not present

## 2015-07-21 DIAGNOSIS — R600 Localized edema: Secondary | ICD-10-CM

## 2015-07-21 LAB — BASIC METABOLIC PANEL
BUN: 15 mg/dL (ref 6–23)
CO2: 28 mEq/L (ref 19–32)
Calcium: 9.3 mg/dL (ref 8.4–10.5)
Chloride: 92 mEq/L — ABNORMAL LOW (ref 96–112)
Creatinine, Ser: 1.13 mg/dL (ref 0.40–1.20)
GFR: 48.65 mL/min — ABNORMAL LOW (ref >60.00)
Glucose, Bld: 90 mg/dL (ref 70–99)
Potassium: 4.2 mEq/L (ref 3.5–5.1)
Sodium: 130 mEq/L — ABNORMAL LOW (ref 135–145)

## 2015-07-21 NOTE — Progress Notes (Signed)
Subjective:    Patient ID: Theresa Norton, female    DOB: 1928/08/13, 82 y.o.   MRN: [id number]  HPI  follow-up venous stasis leg edema. Patient currently takes furosemide 20 mg once daily. History of PMR and she has been able to taper completely off prednisone since last visit. She's not had any breakthrough myalgias. She has hypertension which has been treated with amlodipine low-dose 2.5 mg and valsartan and no recent dizziness or headaches.   Very sedentary. Sits around much of the day with her feet dependent. She has not been willing to wear compression hose in the past  Past Medical History  Diagnosis Date  . Allergy   . Hypertension   . Colon polyps   . Heart block AV second degree     a. s/p MDT Addapta dual chamber pacemaker 05/2013 by Dr Rayann Heman.  Marland Kitchen PAF (paroxysmal atrial fibrillation) ([place])     a. Found post-pacer 05/2013->Xarelto added;  b. 05/2013 Echo: EF 60-65%, mild LVH, nl wall motion w/o rwma.  . Groin hematoma     a. 05/2013 R groin hematoma post-cath - u/s 05/31/13 large 7.49 cm r inguinal hematoma extending into thigh, no psa or avf.  . Anemia   . CAD (coronary artery disease)     a. 05/2013 nonobs dzs by cath.  . Pacemaker   . H/O hiatal hernia   . Arthritis     "in my fingers" (07/08/2013)  . On home oxygen therapy     "2L maybe 12h/day" (07/08/2013)  . Pacemaker    Past Surgical History  Procedure Laterality Date  . Bunionectomy Bilateral 1967  . Breast cyst excision Right 1959; 1970's  . Insert / replace / remove pacemaker  05/2013    MDT ADDRL1 implanted by Dr Rayann Heman for heart block  . Insert / replace / remove pacemaker  06/2013    Right ventricular lead perforation with tamponade  . Cardiac catheterization    . Cataract extraction w/ intraocular lens  implant, bilateral Bilateral   . Dilation and curettage of uterus    . Varicose vein surgery Bilateral ~ 2002  . Left heart catheterization with coronary angiogram N/A 05/27/2013    Procedure: LEFT HEART  CATHETERIZATION WITH CORONARY ANGIOGRAM;  Surgeon: Birdie Riddle, MD;  Location: [facility];  Service: Cardiovascular;  Laterality: N/A;  . Temporary pacemaker insertion N/A 05/27/2013    Procedure: TEMPORARY PACEMAKER INSERTION;  Surgeon: Birdie Riddle, MD;  Location: [facility];  Service: Cardiovascular;  Laterality: N/A;  . Permanent pacemaker insertion N/A 05/27/2013    Procedure: PERMANENT PACEMAKER INSERTION;  Surgeon: Coralyn Mark, MD;  Location: [facility];  Service: Cardiovascular;  Laterality: N/A;  . Pericardial tap N/A 06/11/2013    Procedure: PERICARDIAL TAP;  Surgeon: Birdie Riddle, MD;  Location: [facility];  Service: Cardiovascular;  Laterality: N/A;  . Lead revision N/A 06/13/2013    Procedure: LEAD REVISION;  Surgeon: Coralyn Mark, MD;  Location: [facility];  Service: Cardiovascular;  Laterality: N/A;    reports that she has never smoked. She has never used smokeless tobacco. She reports that she drinks about 8.4 oz of alcohol per week. She reports that she does not use illicit drugs. family history includes Cancer in her father. Allergies  Allergen Reactions  . Codeine     GI upset  . Morphine And Related     GI upset      Review of Systems  Constitutional: Negative  for fatigue and unexpected weight change.  Eyes: Negative for visual disturbance.  Respiratory: Negative for cough, chest tightness, shortness of breath and wheezing.   Cardiovascular: Positive for leg swelling. Negative for chest pain and palpitations.  Musculoskeletal: Negative for myalgias.  Neurological: Negative for dizziness, seizures, syncope, weakness, light-headedness and headaches.       Objective:   Physical Exam  Constitutional: She appears well-developed and well-nourished.  Eyes: Pupils are equal, round, and reactive to light.  Neck: Neck supple. No JVD present. No thyromegaly present.  Cardiovascular: Normal rate and regular rhythm.  Exam reveals no gallop.   Pulmonary/Chest:  Effort normal and breath sounds normal. No respiratory distress. She has no wheezes. She has no rales.  Musculoskeletal: She exhibits edema.  Trace pitting edema lower legs bilaterally. No ulcerations.  Neurological: She is alert.          Assessment & Plan:   #1 bilateral leg edema. Suspect largely venous stasis edema. Elevate legs more frequently. Recommend compression garments but she declines. Continue low-dose furosemide as needed. Continue weight loss   #2 history of hyponatremia. Recheck basic metabolic panel   #3 hypertension stable and at goal   #4 history of PMR. Stable off prednisone

## 2015-07-21 NOTE — Patient Instructions (Signed)
Venous Stasis or Chronic Venous Insufficiency  Chronic venous insufficiency, also called venous stasis, is a condition that affects the veins in the legs. The condition prevents blood from being pumped through these veins effectively. Blood may no longer be pumped effectively from the legs back to the heart. This condition can range from mild to severe. With proper treatment, you should be able to continue with an active life.  CAUSES   Chronic venous insufficiency occurs when the vein walls become stretched, weakened, or damaged or when valves within the vein are damaged. Some common causes of this include:  · High blood pressure inside the veins (venous hypertension).  · Increased blood pressure in the leg veins from long periods of sitting or standing.  · A blood clot that blocks blood flow in a vein (deep vein thrombosis).  · Inflammation of a superficial vein (phlebitis) that causes a blood clot to form.  RISK FACTORS  Various things can make you more likely to develop chronic venous insufficiency, including:  · Family history of this condition.  · Obesity.  · Pregnancy.  · Sedentary lifestyle.  · Smoking.  · Jobs requiring long periods of standing or sitting in one place.  · Being a certain age. Women in their 40s and 50s and men in their 70s are more likely to develop this condition.  SIGNS AND SYMPTOMS   Symptoms may include:   · Varicose veins.  · Skin breakdown or ulcers.  · Reddened or discolored skin on the leg.  · Brown, smooth, tight, and painful skin just above the ankle, usually on the inside surface (lipodermatosclerosis).  · Swelling.  DIAGNOSIS   To diagnose this condition, your health care provider will take a medical history and do a physical exam. The following tests may be ordered to confirm the diagnosis:  · Duplex ultrasound--A procedure that produces a picture of a blood vessel and nearby organs and also provides information on blood flow through the blood vessel.  · Plethysmography--A  procedure that tests blood flow.  · A venogram, or venography--A procedure used to look at the veins using X-ray and dye.  TREATMENT  The goals of treatment are to help you return to an active life and to minimize pain or disability. Treatment will depend on the severity of the condition. Medical procedures may be needed for severe cases. Treatment options may include:   · Use of compression stockings. These can help with symptoms and lower the chances of the problem getting worse, but they do not cure the problem.  · Sclerotherapy--A procedure involving an injection of a material that "dissolves" the damaged veins. Other veins in the network of blood vessels take over the function of the damaged veins.  · Surgery to remove the vein or cut off blood flow through the vein (vein stripping or laser ablation surgery).  · Surgery to repair a valve.  HOME CARE INSTRUCTIONS   · Wear compression stockings as directed by your health care provider.  · Only take over-the-counter or prescription medicines for pain, discomfort, or fever as directed by your health care provider.  · Follow up with your health care provider as directed.  SEEK MEDICAL CARE IF:   · You have redness, swelling, or increasing pain in the affected area.  · You see a red streak or line that extends up or down from the affected area.  · You have a breakdown or loss of skin in the affected area, even if the breakdown is   sudden numbness or weakness in the foot or ankle below the affected area, or you have trouble moving your foot or ankle.  You have a fever or persistent symptoms for more than 2-3 days.  You have a fever and your symptoms suddenly get worse. MAKE SURE YOU:   Understand these instructions.  Will watch your  condition.  Will get help right away if you are not doing well or get worse.   This information is not intended to replace advice given to you by your health care provider. Make sure you discuss any questions you have with your health care provider.   Document Released: 08/28/2006 Document Revised: 02/12/2013 Document Reviewed: 12/30/2012 Elsevier Interactive Patient Education 2016 [place] legs frequently Continue with the Furosemide as needed.

## 2015-07-21 NOTE — Progress Notes (Signed)
Pre visit review using our clinic review tool, if applicable. No additional management support is needed unless otherwise documented below in the visit note. 

## 2015-09-14 ENCOUNTER — Encounter: Payer: Self-pay | Admitting: Nurse Practitioner

## 2015-09-14 ENCOUNTER — Ambulatory Visit (INDEPENDENT_AMBULATORY_CARE_PROVIDER_SITE_OTHER): Payer: Medicare Other | Admitting: Nurse Practitioner

## 2015-09-14 VITALS — BP 130/70 | HR 96 | Ht 64.0 in | Wt 180.4 lb

## 2015-09-14 DIAGNOSIS — I480 Paroxysmal atrial fibrillation: Secondary | ICD-10-CM

## 2015-09-14 DIAGNOSIS — I5032 Chronic diastolic (congestive) heart failure: Secondary | ICD-10-CM | POA: Diagnosis not present

## 2015-09-14 DIAGNOSIS — I10 Essential (primary) hypertension: Secondary | ICD-10-CM

## 2015-09-14 DIAGNOSIS — Z950 Presence of cardiac pacemaker: Secondary | ICD-10-CM | POA: Diagnosis not present

## 2015-09-14 DIAGNOSIS — I4891 Unspecified atrial fibrillation: Secondary | ICD-10-CM | POA: Diagnosis not present

## 2015-09-14 LAB — CBC
HCT: 35.4 % (ref 35.0–45.0)
Hemoglobin: 12.3 g/dL (ref 11.7–15.5)
MCH: 33.2 pg — ABNORMAL HIGH (ref 27.0–33.0)
MCHC: 34.7 g/dL (ref 32.0–36.0)
MCV: 95.4 fL (ref 80.0–100.0)
MPV: 9.6 fL (ref 7.5–12.5)
Platelets: 267 10*3/uL (ref 140–400)
RBC: 3.71 MIL/uL — ABNORMAL LOW (ref 3.80–5.10)
RDW: 13 % (ref 11.0–15.0)
WBC: 9.1 10*3/uL (ref 3.8–10.8)

## 2015-09-14 NOTE — Progress Notes (Signed)
CARDIOLOGY OFFICE NOTE  Date:  09/14/2015    [name] Date of Birth: [birth year] Medical Record [id number]  PCP:  Eulas Post, MD  Cardiologist:  Allred    Chief Complaint  Patient presents with  . Atrial Fibrillation  . Coronary Artery Disease  . Congestive Heart Failure    4 month check - seen for Dr. Rayann Heman     History of Present Illness: Theresa Norton is a 82 y.o. female who presents today for a follow up visit. Seen for Dr. Rayann Heman.   Primary cardiologist was previously Dr. Doylene Canard (just sees "as needed"?). She has PAF, has underlying PPM in place for heart block that was complicated by lead perforation/effusion and required lead repositioning, non obstructive CAD per cath in 2015, PMR, and anemia.   I saw her back in October and she now has her care thru Dr. Rayann Heman. Son manages the medicines.  Cardiac status seemed stable. Last saw Dr. Rayann Heman back in January - he cut her dose of Norvasc back for lower extremity edema - she was otherwise felt to be doing ok.   Comes back today. Here with her son. He is frustrated. She really just sits all day. Has her legs down. Still with some swelling. Easily gets short of breath. She says she is happy with how she is. Probably depressed. She has visions of people that have passed on and she says she is ready for that chapter. Misses her husband. Son asking about getting PT. She is now taking her lasix every day. Probably gets too much salt.   Past Medical History  Diagnosis Date  . Allergy   . Hypertension   . Colon polyps   . Heart block AV second degree     a. s/p MDT Addapta dual chamber pacemaker 05/2013 by Dr Rayann Heman.  Marland Kitchen PAF (paroxysmal atrial fibrillation) ([place])     a. Found post-pacer 05/2013->Xarelto added;  b. 05/2013 Echo: EF 60-65%, mild LVH, nl wall motion w/o rwma.  . Groin hematoma     a. 05/2013 R groin hematoma post-cath - u/s 05/31/13 large 7.49 cm r inguinal hematoma extending into thigh, no psa or  avf.  . Anemia   . CAD (coronary artery disease)     a. 05/2013 nonobs dzs by cath.  . Pacemaker   . H/O hiatal hernia   . Arthritis     "in my fingers" (07/08/2013)  . On home oxygen therapy     "2L maybe 12h/day" (07/08/2013)  . Pacemaker     Past Surgical History  Procedure Laterality Date  . Bunionectomy Bilateral 1967  . Breast cyst excision Right 1959; 1970's  . Insert / replace / remove pacemaker  05/2013    MDT ADDRL1 implanted by Dr Rayann Heman for heart block  . Insert / replace / remove pacemaker  06/2013    Right ventricular lead perforation with tamponade  . Cardiac catheterization    . Cataract extraction w/ intraocular lens  implant, bilateral Bilateral   . Dilation and curettage of uterus    . Varicose vein surgery Bilateral ~ 2002  . Left heart catheterization with coronary angiogram N/A 05/27/2013    Procedure: LEFT HEART CATHETERIZATION WITH CORONARY ANGIOGRAM;  Surgeon: Birdie Riddle, MD;  Location: [facility];  Service: Cardiovascular;  Laterality: N/A;  . Temporary pacemaker insertion N/A 05/27/2013    Procedure: TEMPORARY PACEMAKER INSERTION;  Surgeon: Birdie Riddle, MD;  Location: [facility] CATH [facility];  Service: Cardiovascular;  Laterality: N/A;  . Permanent pacemaker insertion N/A 05/27/2013    Procedure: PERMANENT PACEMAKER INSERTION;  Surgeon: Coralyn Mark, MD;  Location: [facility] CATH [facility];  Service: Cardiovascular;  Laterality: N/A;  . Pericardial tap N/A 06/11/2013    Procedure: PERICARDIAL TAP;  Surgeon: Birdie Riddle, MD;  Location: [facility] C[facility];  Service: Cardiovascular;  Laterality: N/A;  . Lead revision N/A 06/13/2013    Procedure: LEAD REVISION;  Surgeon: Coralyn Mark, MD;  Location: [facility];  Service: Cardiovascular;  Laterality: N/A;     Medications: Current Outpatient Prescriptions  Medication Sig Dispense Refill  . acetaminophen (TYLENOL) 325 MG tablet Take 325 mg by mouth every 6 (six) hours as needed for mild pain.    Marland Kitchen amLODipine (NORVASC) 2.5 MG tablet  Take 1 tablet (2.5 mg total) by mouth daily. 90 tablet 3  . beta carotene w/minerals (OCUVITE) tablet Take 1 tablet by mouth daily.    Marland Kitchen dextromethorphan-guaiFENesin (MUCINEX DM) 30-600 MG 12hr tablet Take 1 tablet by mouth 2 (two) times daily as needed for cough.    . furosemide (LASIX) 20 MG tablet Take 20 mg by mouth daily.    Marland Kitchen loratadine (CLARITIN) 10 MG tablet Take 10 mg by mouth daily as needed for allergies.     . potassium chloride (K-DUR,KLOR-CON) 10 MEQ tablet Take 10 mEq by mouth daily.    . valsartan (DIOVAN) 80 MG tablet Take 1 tablet (80 mg total) by mouth daily. 30 tablet 11   No current facility-administered medications for this visit.    Allergies: Allergies  Allergen Reactions  . Codeine     GI upset  . Morphine And Related     GI upset    Social History: The patient  reports that she has never smoked. She has never used smokeless tobacco. She reports that she drinks about 8.4 oz of alcohol per week. She reports that she does not use illicit drugs.   Family History: The patient's family history includes Cancer in her father.   Review of Systems: Please see the history of present illness.   Otherwise, the review of systems is positive for none.   All other systems are reviewed and negative.   Physical Exam: VS:  BP 130/70 mmHg  Pulse 96  Ht 5\' 4"  (1.626 m)  Wt 180 lb 6.4 oz (81.829 kg)  BMI 30.95 kg/m2  SpO2 94% .  BMI Body mass index is 30.95 kg/(m^2).  Wt Readings from Last 3 Encounters:  09/14/15 180 lb 6.4 oz (81.829 kg)  07/21/15 180 lb 12.8 oz (82.01 kg)  05/28/15 184 lb 11.2 oz (83.779 kg)    General: Pleasant. Elderly female who is alert and in no acute distress. Weight is down 4 pounds since last visit here. HEENT: Normal. Neck: Supple, no JVD, carotid bruits, or masses noted.  Cardiac: Regular rate and rhythm. +S3. Trace edema.  Respiratory:  Lungs are clear to auscultation bilaterally with normal work of breathing.  GI: Soft and nontender.    MS: No deformity or atrophy. Gait and ROM intact. Skin: Warm and dry. Color is normal.  Neuro:  Strength and sensation are intact and no gross focal deficits noted.  Psych: Alert, appropriate and with normal affect.   LABORATORY DATA:  EKG:  EKG is ordered today. This demonstrates NSR.  Lab Results  Component Value Date   WBC 7.5 03/02/2015   HGB 12.9 03/02/2015   HCT 37.5 03/02/2015   PLT 247 03/02/2015   GLUCOSE  90 07/21/2015   CHOL 232* 04/17/2012   TRIG 136.0 04/17/2012   HDL 96.80 04/17/2012   LDLDIRECT 121.9 04/17/2012   ALT 16 07/07/2013   AST 16 07/07/2013   NA 130* 07/21/2015   K 4.2 07/21/2015   CL 92* 07/21/2015   CREATININE 1.13 07/21/2015   BUN 15 07/21/2015   CO2 28 07/21/2015   TSH 1.65 10/12/2014   INR 1.20 06/07/2013    BNP (last 3 results) No results for input(s): BNP in the last 8760 hours.  ProBNP (last 3 results) No results for input(s): PROBNP in the last 8760 hours.   Other Studies Reviewed Today:  Echo Study Conclusions from 02/2015  - Left ventricle: The cavity size was normal. Wall thickness was  increased in a pattern of mild LVH. Systolic function was  vigorous. The estimated ejection fraction was in the range of 65%  to 70%. Wall motion was normal; there were no regional wall  motion abnormalities. Doppler parameters are consistent with  abnormal left ventricular relaxation (grade 1 diastolic  dysfunction). Doppler parameters are consistent with high  ventricular filling pressure. - Ascending aorta: The ascending aorta was mildly dilated. - Mitral valve: Calcified annulus. There was mild systolic anterior  motion of the chordal structures. There was mild regurgitation.  Impressions:  - Vigorous LV function; mild LVH; grade 1 diastolic dysfunction  with elevated LV filling pressure; chordal SAM with peak LVOT  velocity of 2 m/s; mild MR.  Assessment/Plan:  1. Underlying PPM - followed by Dr. Rayann Heman   2. PAF -  in sinus. Remains just on aspirin. Normal device check from January noted.   3. CAD - nonobstructive per cath from 2015 -  No symptoms reported. Would manage her conservatively.   4. Cough - seems to be a chronic issue - would defer to PCP   5. Diastolic HF - will check her labs today to include BNP. Echo was updated last fall. I suspect most of her issues are from her weight and being deconditioned.    6. Failure to thrive - son wanting home health to get her more active - would defer this to primary care. She is ok with how she is - seems depressed but also accepting of her age and really does not wish to make changes.   Current medicines are reviewed with the patient today.  The patient does not have concerns regarding medicines other than what has been noted above.  The following changes have been made:  See above.  Labs/ tests ordered today include:    Orders Placed This Encounter  Procedures  . Brain natriuretic peptide  . Basic metabolic panel  . CBC  . TSH  . EKG 12-Lead     Disposition:   FU with Dr. Rayann Heman and his team as planned.    Patient is agreeable to this plan and will call if any problems develop in the interim.   Signed: Burtis Junes, RN, ANP-C 09/14/2015 3:27 PM  [facility] [address] [place], [place]  [address] Phone: [phone] Fax: [phone]

## 2015-09-14 NOTE — Patient Instructions (Addendum)
We will be checking the following labs today - BMET, CBC, BNP and TSH   Medication Instructions:    Continue with your current medicines.     Testing/Procedures To Be Arranged:  N/A  Follow-Up:   See Dr. Rayann Heman back as planned.     Other Special Instructions:   Try to move a little more    If you need a refill on your cardiac medications before your next appointment, please call your pharmacy.   Call the [place] office at [phone] if you have any questions, problems or concerns.

## 2015-09-15 ENCOUNTER — Other Ambulatory Visit: Payer: Self-pay | Admitting: *Deleted

## 2015-09-15 DIAGNOSIS — E871 Hypo-osmolality and hyponatremia: Secondary | ICD-10-CM

## 2015-09-15 LAB — BASIC METABOLIC PANEL
BUN: 15 mg/dL (ref 7–25)
CO2: 21 mmol/L (ref 20–31)
Calcium: 8.9 mg/dL (ref 8.6–10.4)
Chloride: 90 mmol/L — ABNORMAL LOW (ref 98–110)
Creat: 1.1 mg/dL — ABNORMAL HIGH (ref 0.60–0.88)
Glucose, Bld: 97 mg/dL (ref 65–99)
Potassium: 4.3 mmol/L (ref 3.5–5.3)
Sodium: 126 mmol/L — ABNORMAL LOW (ref 135–146)

## 2015-09-15 LAB — BRAIN NATRIURETIC PEPTIDE: Brain Natriuretic Peptide: 51.9 pg/mL (ref <100)

## 2015-09-15 LAB — TSH: TSH: 1.11 mIU/L

## 2015-09-20 ENCOUNTER — Encounter: Payer: Self-pay | Admitting: Family Medicine

## 2015-09-20 ENCOUNTER — Telehealth: Payer: Self-pay | Admitting: Internal Medicine

## 2015-09-20 ENCOUNTER — Ambulatory Visit (INDEPENDENT_AMBULATORY_CARE_PROVIDER_SITE_OTHER): Payer: Medicare Other | Admitting: Family Medicine

## 2015-09-20 VITALS — BP 113/70 | HR 103 | Temp 98.2°F | Ht 64.0 in | Wt 182.0 lb

## 2015-09-20 DIAGNOSIS — J209 Acute bronchitis, unspecified: Secondary | ICD-10-CM | POA: Diagnosis not present

## 2015-09-20 DIAGNOSIS — D509 Iron deficiency anemia, unspecified: Secondary | ICD-10-CM

## 2015-09-20 DIAGNOSIS — I10 Essential (primary) hypertension: Secondary | ICD-10-CM | POA: Diagnosis not present

## 2015-09-20 DIAGNOSIS — I442 Atrioventricular block, complete: Secondary | ICD-10-CM

## 2015-09-20 DIAGNOSIS — I480 Paroxysmal atrial fibrillation: Secondary | ICD-10-CM

## 2015-09-20 DIAGNOSIS — J90 Pleural effusion, not elsewhere classified: Secondary | ICD-10-CM

## 2015-09-20 MED ORDER — BENZONATATE 200 MG PO CAPS: 200.0000 mg | ORAL_CAPSULE | Freq: Two times a day (BID) | ORAL | Status: DC | PRN

## 2015-09-20 MED ORDER — AZITHROMYCIN 250 MG PO TABS
ORAL_TABLET | ORAL | Status: DC
Start: 2015-09-20 — End: 2015-09-27

## 2015-09-20 NOTE — Telephone Encounter (Signed)
LMOM about transmissions to be transferred back to our clinic and to call back regarding scheduling next transmission.

## 2015-09-20 NOTE — Progress Notes (Signed)
Pre visit review using our clinic review tool, if applicable. No additional management support is needed unless otherwise documented below in the visit note. 

## 2015-09-20 NOTE — Telephone Encounter (Signed)
New message      Son is calling to see what needs to be done to have pt switched back to Dr Rayann Heman for remote transmissions and to continue seeing Dr Rayann Heman.  He is seeing on patients EOB a doctor at [facility] is charging ins for remote pacemaker checks.  Please call

## 2015-09-20 NOTE — Progress Notes (Signed)
   Subjective:    Patient ID: Theresa Norton, female    DOB: 10-02-1928, 82 y.o.   MRN: [id number]  HPI Here with her son for a cough that started about 2 weeks ago. This is not productive. She  has a stuffy head and has trouble breathing through the nose. No fever or ST. She is using Mucinex DM, Loratadine, and Robitussin with little relief. She saw Dr. Rayann Heman on 09-14-15 and had a good cardiologic evaluation. Her lungs were clear although she has 2+ pedal edema. Labs that day showed a normal BNP. A recent ECHO showed good systolic function with an EF of 65-70%. Her last CXR in October was clear except for some bilateral inferior pleural effusions. Her son is concerned about possible depression because she sits around the house all day and seems unmotivated to do anything. He says she drinks alcohol on a daily basis.    Review of Systems  Constitutional: Positive for fatigue. Negative for fever, chills and diaphoresis.  HENT: Positive for congestion and postnasal drip. Negative for sinus pressure and sore throat.   Eyes: Negative.   Respiratory: Positive for cough, chest tightness and shortness of breath. Negative for wheezing.   Cardiovascular: Negative for chest pain and palpitations.       Objective:   Physical Exam  Constitutional: She appears well-developed and well-nourished.  Coughing frequently   HENT:  Right Ear: External ear normal.  Left Ear: External ear normal.  Nose: Nose normal.  Mouth/Throat: Oropharynx is clear and moist.  Eyes: Conjunctivae are normal.  Neck: Neck supple. No thyromegaly present.  Cardiovascular: Normal rate, regular rhythm, normal heart sounds and intact distal pulses.   Pulmonary/Chest: Effort normal. No respiratory distress. She has no wheezes.  Faint crackles at both bases   Musculoskeletal:  2+ edema in both ankles and feet   Lymphadenopathy:    She has no cervical adenopathy.  Neurological: She is alert.          Assessment & Plan:    Bronchitis, treat with a Zpack and try Benzonatate for the cough. I asked them to follow up with Dr. Elease Hashimoto next week for the bronchitis and to re-evaluate the presumed pleural effusions. She does seem to have a good bit of SOB on minimal exertion. Her son will address possible depression with Dr. Elease Hashimoto as well.  Laurey Morale, MD

## 2015-09-20 NOTE — Telephone Encounter (Signed)
Son called back. Transfer requested in [place] from [place]. Scheduled for June 12 for remote transmission- last transmission March 7 at [facility] per son. He verbalizes understanding.

## 2015-09-27 ENCOUNTER — Ambulatory Visit (INDEPENDENT_AMBULATORY_CARE_PROVIDER_SITE_OTHER): Payer: Medicare Other | Admitting: Family Medicine

## 2015-09-27 VITALS — BP 140/90 | HR 70 | Temp 97.7°F | Ht 64.0 in | Wt 179.0 lb

## 2015-09-27 DIAGNOSIS — F329 Major depressive disorder, single episode, unspecified: Secondary | ICD-10-CM | POA: Diagnosis not present

## 2015-09-27 DIAGNOSIS — E871 Hypo-osmolality and hyponatremia: Secondary | ICD-10-CM

## 2015-09-27 DIAGNOSIS — R05 Cough: Secondary | ICD-10-CM

## 2015-09-27 DIAGNOSIS — R059 Cough, unspecified: Secondary | ICD-10-CM

## 2015-09-27 DIAGNOSIS — R600 Localized edema: Secondary | ICD-10-CM

## 2015-09-27 DIAGNOSIS — R4589 Other symptoms and signs involving emotional state: Secondary | ICD-10-CM

## 2015-09-27 NOTE — Progress Notes (Signed)
Subjective:    Patient ID: [name], female    DOB: 23-Apr-1929, 82 y.o.   MRN: [id number]  HPI Patient here accompanied by son with several concerns:  Recent cough which has actually improved after treatment with Zithromax. She's not any fever over the past week and no dyspnea at rest. Does have history of heart failure. Recent BNP level 51. History of some chronic bilateral pleural effusions. No reported orthopnea.  Recently saw cardiology. Hyponatremia with sodium 126. She apparently is not taking her regular medications. She was advised to do fluid restriction of 1500 mL but not clear she is doing that either.  Son has concerns about excessive alcohol use. He states that she drinks several drinks per day though patient adamantly denies this. He also has concerns about her being depressed and low motivation though patient denies any major depression issues. She apparently has very low motivation.  She has had some bilateral leg edema in recent days but apparently not taking her Lasix consistently.  Past Medical History  Diagnosis Date  . Allergy   . Hypertension   . Colon polyps   . Heart block AV second degree     a. s/p MDT Addapta dual chamber pacemaker 05/2013 by Dr Rayann Heman.  Marland Kitchen PAF (paroxysmal atrial fibrillation) ([place])     a. Found post-pacer 05/2013->Xarelto added;  b. 05/2013 Echo: EF 60-65%, mild LVH, nl wall motion w/o rwma.  . Groin hematoma     a. 05/2013 R groin hematoma post-cath - u/s 05/31/13 large 7.49 cm r inguinal hematoma extending into thigh, no psa or avf.  . Anemia   . CAD (coronary artery disease)     a. 05/2013 nonobs dzs by cath.  . Pacemaker   . H/O hiatal hernia   . Arthritis     "in my fingers" (07/08/2013)  . On home oxygen therapy     "2L maybe 12h/day" (07/08/2013)  . Pacemaker    Past Surgical History  Procedure Laterality Date  . Bunionectomy Bilateral 1967  . Breast cyst excision Right 1959; 1970's  . Insert / replace / remove  pacemaker  05/2013    MDT ADDRL1 implanted by Dr Rayann Heman for heart block  . Insert / replace / remove pacemaker  06/2013    Right ventricular lead perforation with tamponade  . Cardiac catheterization    . Cataract extraction w/ intraocular lens  implant, bilateral Bilateral   . Dilation and curettage of uterus    . Varicose vein surgery Bilateral ~ 2002  . Left heart catheterization with coronary angiogram N/A 05/27/2013    Procedure: LEFT HEART CATHETERIZATION WITH CORONARY ANGIOGRAM;  Surgeon: Birdie Riddle, MD;  Location: [facility] CATH LAB;  Service: Cardiovascular;  Laterality: N/A;  . Temporary pacemaker insertion N/A 05/27/2013    Procedure: TEMPORARY PACEMAKER INSERTION;  Surgeon: Birdie Riddle, MD;  Location: [facility] CATH LAB;  Service: Cardiovascular;  Laterality: N/A;  . Permanent pacemaker insertion N/A 05/27/2013    Procedure: PERMANENT PACEMAKER INSERTION;  Surgeon: Coralyn Mark, MD;  Location: [facility] CATH LAB;  Service: Cardiovascular;  Laterality: N/A;  . Pericardial tap N/A 06/11/2013    Procedure: PERICARDIAL TAP;  Surgeon: Birdie Riddle, MD;  Location: [facility] CATH LAB;  Service: Cardiovascular;  Laterality: N/A;  . Lead revision N/A 06/13/2013    Procedure: LEAD REVISION;  Surgeon: Coralyn Mark, MD;  Location: [facility] CATH LAB;  Service: Cardiovascular;  Laterality: N/A;    reports that she has never smoked. She  has never used smokeless tobacco. She reports that she drinks about 8.4 oz of alcohol per week. She reports that she does not use illicit drugs. family history includes Cancer in her father. Allergies  Allergen Reactions  . Codeine     GI upset  . Morphine And Related     GI upset      Review of Systems  Constitutional: Positive for fatigue. Negative for fever and chills.  Respiratory: Positive for cough.   Cardiovascular: Positive for leg swelling. Negative for chest pain and palpitations.  Gastrointestinal: Negative for abdominal pain.  Endocrine: Negative for polydipsia and  polyuria.  Genitourinary: Negative for dysuria.  Neurological: Negative for dizziness and syncope.  Hematological: Negative for adenopathy.  Psychiatric/Behavioral: Negative for confusion and agitation.       Objective:   Physical Exam  Constitutional: She appears well-developed and well-nourished.  Cardiovascular: Normal rate and regular rhythm.   Pulmonary/Chest: Effort normal and breath sounds normal.  Few crackles noted in the left base. No respiratory distress. No wheezes.  Musculoskeletal: She exhibits edema.  1+ edema legs and ankles bilaterally  Neurological: She is alert. No cranial nerve deficit.  Psychiatric:  PHQ-9= 7.          Assessment & Plan:  #1 recent acute bronchitis. Symptomatically improved. She denies any fever or respiratory distress at this time. No further antibiotics indicated at this time  #2 concerns for possible depressed mood expressed per son the patient denies. PH Q-9 score of 7. Not clear that she needs antidepressants at this point. We discussed possible counseling. We discussed trying to keep her engaged in social activities much as possible  #3 concern for possible alcohol abuse expressed per son. Patient denies.  #4 recent hyponatremia with sodium 126. Patient is not following fluid restriction as recommended per cardiology. These were reinforced today. She has follow-up labs scheduled next week with cardiology to reassess basic metabolic panel  #5 peripheral edema. Patient is encouraged to start back furosemide 20 mg once daily  #6 possible memory deficits. This is a concern per son but not patient. Bring back for reassessment and will evaluate Mini-Mental Status exam. Previous TSH and B12 testing normal  Eulas Post MD [facility]

## 2015-09-27 NOTE — Progress Notes (Signed)
Pre visit review using our clinic review tool, if applicable. No additional management support is needed unless otherwise documented below in the visit note. 

## 2015-09-29 ENCOUNTER — Other Ambulatory Visit (INDEPENDENT_AMBULATORY_CARE_PROVIDER_SITE_OTHER): Payer: Medicare Other

## 2015-09-29 DIAGNOSIS — E871 Hypo-osmolality and hyponatremia: Secondary | ICD-10-CM | POA: Diagnosis not present

## 2015-09-29 LAB — BASIC METABOLIC PANEL
BUN: 14 mg/dL (ref 7–25)
CO2: 24 mmol/L (ref 20–31)
Calcium: 9 mg/dL (ref 8.6–10.4)
Chloride: 96 mmol/L — ABNORMAL LOW (ref 98–110)
Creat: 1.1 mg/dL — ABNORMAL HIGH (ref 0.60–0.88)
Glucose, Bld: 102 mg/dL — ABNORMAL HIGH (ref 65–99)
Potassium: 4.3 mmol/L (ref 3.5–5.3)
Sodium: 132 mmol/L — ABNORMAL LOW (ref 135–146)

## 2015-10-14 ENCOUNTER — Telehealth: Payer: Self-pay | Admitting: Nurse Practitioner

## 2015-10-14 NOTE — Telephone Encounter (Signed)
Pt's care giver Al Laflam is aware of lab results and NP's recommendations.

## 2015-10-14 NOTE — Telephone Encounter (Signed)
New message ° ° ° ° °Returning a call to the nurse °

## 2015-10-18 ENCOUNTER — Telehealth: Payer: Self-pay | Admitting: Cardiology

## 2015-10-18 ENCOUNTER — Ambulatory Visit (INDEPENDENT_AMBULATORY_CARE_PROVIDER_SITE_OTHER): Payer: Medicare Other | Admitting: *Deleted

## 2015-10-18 DIAGNOSIS — Z950 Presence of cardiac pacemaker: Secondary | ICD-10-CM

## 2015-10-18 DIAGNOSIS — I442 Atrioventricular block, complete: Secondary | ICD-10-CM

## 2015-10-18 NOTE — Telephone Encounter (Signed)
Spoke with pt and reminded pt of remote transmission that is due today. Pt verbalized understanding.   

## 2015-10-19 NOTE — Progress Notes (Signed)
Remote pacemaker transmission.   

## 2015-10-21 LAB — CUP PACEART REMOTE DEVICE CHECK
Battery Impedance: 156 Ohm
Battery Remaining Longevity: 117 mo
Battery Voltage: 2.79 V
Brady Statistic AP VP Percent: 1 %
Brady Statistic AP VS Percent: 0 %
Brady Statistic AS VP Percent: 99 %
Brady Statistic AS VS Percent: 0 %
Date Time Interrogation Session: 20170612174016
Implantable Lead Implant Date: 20150120
Implantable Lead Implant Date: 20150120
Implantable Lead Location: 753859
Implantable Lead Location: 753860
Implantable Lead Model: 5076
Implantable Lead Model: 5076
Lead Channel Impedance Value: 403 Ohm
Lead Channel Impedance Value: 506 Ohm
Lead Channel Pacing Threshold Amplitude: 0.375 V
Lead Channel Pacing Threshold Amplitude: 1.125 V
Lead Channel Pacing Threshold Pulse Width: 0.4 ms
Lead Channel Pacing Threshold Pulse Width: 0.4 ms
Lead Channel Sensing Intrinsic Amplitude: 1 mV
Lead Channel Setting Pacing Amplitude: 2 V
Lead Channel Setting Pacing Amplitude: 2.5 V
Lead Channel Setting Pacing Pulse Width: 0.4 ms
Lead Channel Setting Sensing Sensitivity: 2.8 mV

## 2015-10-26 ENCOUNTER — Ambulatory Visit (INDEPENDENT_AMBULATORY_CARE_PROVIDER_SITE_OTHER): Payer: Medicare Other | Admitting: Family Medicine

## 2015-10-26 VITALS — BP 110/80 | HR 100 | Temp 98.2°F | Ht 64.0 in | Wt 174.0 lb

## 2015-10-26 DIAGNOSIS — R600 Localized edema: Secondary | ICD-10-CM

## 2015-10-26 DIAGNOSIS — R413 Other amnesia: Secondary | ICD-10-CM | POA: Diagnosis not present

## 2015-10-26 NOTE — Patient Instructions (Signed)
Hold Norvasc (Amlodipine) for now Monitor blood pressure and be in touch if consistently > 150/90

## 2015-10-26 NOTE — Progress Notes (Signed)
Pre visit review using our clinic review tool, if applicable. No additional management support is needed unless otherwise documented below in the visit note. 

## 2015-10-26 NOTE — Progress Notes (Signed)
Subjective:    Patient ID: Theresa Norton, female    DOB: 07/29/1928, 82 y.o.   MRN: [id number]  HPI Patient is seen today to discuss possible memory impairment. This is brought up last visit by her son. Patient is not convinced that she has any major issues. Recent TSH normal. B12 testing has been normal previously. No active depression concerns. Recent PH Q-9 score of 7  Patient's son also brought up concern for possible alcohol overuse. Patient denies. She usually does have at least 1 drink per day sometimes more. Alternates between wine and beer.  She has noticed an increased edema recently in her feet and ankles-though her weight is actually down 5 pounds compared to last visit. She remains on Lasix 20 mg daily and is supposed to be on 1500 mL's per day fluid restriction but is not following that. She had recent hyponatremia with sodium 126 and on follow-up 132. Takes low-dose amlodipine 2.5 mg along with valsartan . Recent BNP level normal. No orthopnea. No dyspnea.  Past Medical History  Diagnosis Date  . Allergy   . Hypertension   . Colon polyps   . Heart block AV second degree     a. s/p MDT Addapta dual chamber pacemaker 05/2013 by Dr Rayann Heman.  Marland Kitchen PAF (paroxysmal atrial fibrillation) ([place])     a. Found post-pacer 05/2013->Xarelto added;  b. 05/2013 Echo: EF 60-65%, mild LVH, nl wall motion w/o rwma.  . Groin hematoma     a. 05/2013 R groin hematoma post-cath - u/s 05/31/13 large 7.49 cm r inguinal hematoma extending into thigh, no psa or avf.  . Anemia   . CAD (coronary artery disease)     a. 05/2013 nonobs dzs by cath.  . Pacemaker   . H/O hiatal hernia   . Arthritis     "in my fingers" (07/08/2013)  . On home oxygen therapy     "2L maybe 12h/day" (07/08/2013)  . Pacemaker    Past Surgical History  Procedure Laterality Date  . Bunionectomy Bilateral 1967  . Breast cyst excision Right 1959; 1970's  . Insert / replace / remove pacemaker  05/2013    MDT ADDRL1 implanted by  Dr Rayann Heman for heart block  . Insert / replace / remove pacemaker  06/2013    Right ventricular lead perforation with tamponade  . Cardiac catheterization    . Cataract extraction w/ intraocular lens  implant, bilateral Bilateral   . Dilation and curettage of uterus    . Varicose vein surgery Bilateral ~ 2002  . Left heart catheterization with coronary angiogram N/A 05/27/2013    Procedure: LEFT HEART CATHETERIZATION WITH CORONARY ANGIOGRAM;  Surgeon: Birdie Riddle, MD;  Location: [facility] CATH LAB;  Service: Cardiovascular;  Laterality: N/A;  . Temporary pacemaker insertion N/A 05/27/2013    Procedure: TEMPORARY PACEMAKER INSERTION;  Surgeon: Birdie Riddle, MD;  Location: [facility] CATH LAB;  Service: Cardiovascular;  Laterality: N/A;  . Permanent pacemaker insertion N/A 05/27/2013    Procedure: PERMANENT PACEMAKER INSERTION;  Surgeon: Coralyn Mark, MD;  Location: [facility] CATH LAB;  Service: Cardiovascular;  Laterality: N/A;  . Pericardial tap N/A 06/11/2013    Procedure: PERICARDIAL TAP;  Surgeon: Birdie Riddle, MD;  Location: [facility] CATH LAB;  Service: Cardiovascular;  Laterality: N/A;  . Lead revision N/A 06/13/2013    Procedure: LEAD REVISION;  Surgeon: Coralyn Mark, MD;  Location: [facility] CATH LAB;  Service: Cardiovascular;  Laterality: N/A;    reports that she has  never smoked. She has never used smokeless tobacco. She reports that she drinks about 8.4 oz of alcohol per week. She reports that she does not use illicit drugs. family history includes Cancer in her father. Allergies  Allergen Reactions  . Codeine     GI upset  . Morphine And Related     GI upset  '    Review of Systems  Constitutional: Negative for appetite change and unexpected weight change.  Respiratory: Negative for cough and shortness of breath.   Cardiovascular: Positive for leg swelling. Negative for chest pain and palpitations.  Genitourinary: Negative for dysuria.  Neurological: Negative for dizziness and weakness.    Psychiatric/Behavioral: Negative for dysphoric mood and agitation. The patient is not nervous/anxious.        Objective:   Physical Exam  Constitutional: She appears well-developed and well-nourished.  Neck: Neck supple. No thyromegaly present.  Cardiovascular: Normal rate and regular rhythm.   Pulmonary/Chest: Effort normal and breath sounds normal. No respiratory distress. She has no wheezes. She has no rales.  Musculoskeletal: She exhibits edema.  Neurological: She is alert. No cranial nerve deficit.  Psychiatric:  MMSE score 29/30          Assessment & Plan:  #1 concern for possible memory deficit. She did score 29 out of 30 on MMSE-but she struggled somewhat on things like day of the week and year before coming up with answer. Recent TSH normal. B12 previously normal. Consider repeat in 4-6 months.  We've not recommended any medications at this point.  #2 leg and foot edema. Her blood pressure today was 110/80. She is on low-dose amlodipine 2.5 mg we recommended trying to hold that and monitor blood pressure closely next few weeks.  Eulas Post MD [facility]

## 2015-10-28 ENCOUNTER — Encounter: Payer: Self-pay | Admitting: Cardiology

## 2015-11-11 ENCOUNTER — Encounter: Payer: Self-pay | Admitting: Cardiology

## 2016-01-18 ENCOUNTER — Ambulatory Visit (INDEPENDENT_AMBULATORY_CARE_PROVIDER_SITE_OTHER): Payer: Medicare Other | Admitting: *Deleted

## 2016-01-18 ENCOUNTER — Telehealth: Payer: Self-pay | Admitting: Cardiology

## 2016-01-18 DIAGNOSIS — I442 Atrioventricular block, complete: Secondary | ICD-10-CM | POA: Diagnosis not present

## 2016-01-18 NOTE — Progress Notes (Signed)
Remote pacemaker transmission.   

## 2016-01-18 NOTE — Telephone Encounter (Signed)
Confirmed remote transmission w/ pt daughter.   

## 2016-01-20 ENCOUNTER — Encounter: Payer: Self-pay | Admitting: Cardiology

## 2016-01-20 ENCOUNTER — Ambulatory Visit: Payer: Medicare Other | Admitting: Family Medicine

## 2016-01-21 ENCOUNTER — Ambulatory Visit (INDEPENDENT_AMBULATORY_CARE_PROVIDER_SITE_OTHER): Payer: Medicare Other | Admitting: Family Medicine

## 2016-01-21 VITALS — BP 110/78 | HR 70 | Temp 98.0°F | Ht 64.0 in | Wt 177.0 lb

## 2016-01-21 DIAGNOSIS — I10 Essential (primary) hypertension: Secondary | ICD-10-CM

## 2016-01-21 NOTE — Patient Instructions (Signed)
Stop the Valsartan. Monitor blood pressure and be in touch if consistently > 140/90

## 2016-01-21 NOTE — Progress Notes (Signed)
Pre visit review using our clinic review tool, if applicable. No additional management support is needed unless otherwise documented below in the visit note. 

## 2016-01-21 NOTE — Progress Notes (Signed)
Subjective:     Patient ID: Theresa Norton, female   DOB: Apr 12, 1929, 82 y.o.   MRN: [id number]  HPI Follow-up hypertension and bilateral ankle edema. She saw cardiology back in the spring and had normal BNP levels. She had low sodium which did improve with some fluid restriction. We discontinued amlodipine recently as she had excellent blood pressure and we're trying to get rid of vasodilator which could exacerbate her peripheral edema. She has not seen any change in her edema but blood pressure has apparently been stable since discontinuing amlodipine. She has no dizziness. No headaches. No recent falls. She remains on furosemide 20 mgs daily and potassium 1 daily  Past Medical History:  Diagnosis Date  . Allergy   . Anemia   . Arthritis    "in my fingers" (07/08/2013)  . CAD (coronary artery disease)    a. 05/2013 nonobs dzs by cath.  . Colon polyps   . Groin hematoma    a. 05/2013 R groin hematoma post-cath - u/s 05/31/13 large 7.49 cm r inguinal hematoma extending into thigh, no psa or avf.  . H/O hiatal hernia   . Heart block AV second degree    a. s/p MDT Addapta dual chamber pacemaker 05/2013 by Dr Rayann Heman.  . Hypertension   . On home oxygen therapy    "2L maybe 12h/day" (07/08/2013)  . Pacemaker   . Pacemaker   . PAF (paroxysmal atrial fibrillation) ([place])    a. Found post-pacer 05/2013->Xarelto added;  b. 05/2013 Echo: EF 60-65%, mild LVH, nl wall motion w/o rwma.   Past Surgical History:  Procedure Laterality Date  . BREAST CYST EXCISION Right 1959; 1970's  . BUNIONECTOMY Bilateral 1967  . CARDIAC CATHETERIZATION    . CATARACT EXTRACTION W/ INTRAOCULAR LENS  IMPLANT, BILATERAL Bilateral   . DILATION AND CURETTAGE OF UTERUS    . INSERT / REPLACE / REMOVE PACEMAKER  05/2013   MDT ADDRL1 implanted by Dr Rayann Heman for heart block  . INSERT / REPLACE / REMOVE PACEMAKER  06/2013   Right ventricular lead perforation with tamponade  . LEAD REVISION N/A 06/13/2013   Procedure: LEAD REVISION;   Surgeon: Coralyn Mark, MD;  Location: [place] CATH LAB;  Service: Cardiovascular;  Laterality: N/A;  . LEFT HEART CATHETERIZATION WITH CORONARY ANGIOGRAM N/A 05/27/2013   Procedure: LEFT HEART CATHETERIZATION WITH CORONARY ANGIOGRAM;  Surgeon: Birdie Riddle, MD;  Location: [facility] CATH LAB;  Service: Cardiovascular;  Laterality: N/A;  . PERICARDIAL TAP N/A 06/11/2013   Procedure: PERICARDIAL TAP;  Surgeon: Birdie Riddle, MD;  Location: [facility] CATH LAB;  Service: Cardiovascular;  Laterality: N/A;  . PERMANENT PACEMAKER INSERTION N/A 05/27/2013   Procedure: PERMANENT PACEMAKER INSERTION;  Surgeon: Coralyn Mark, MD;  Location: [facility] CATH LAB;  Service: Cardiovascular;  Laterality: N/A;  . TEMPORARY PACEMAKER INSERTION N/A 05/27/2013   Procedure: TEMPORARY PACEMAKER INSERTION;  Surgeon: Birdie Riddle, MD;  Location: [facility] CATH LAB;  Service: Cardiovascular;  Laterality: N/A;  . VARICOSE VEIN SURGERY Bilateral ~ 2002    reports that she has never smoked. She has never used smokeless tobacco. She reports that she drinks about 8.4 oz of alcohol per week . She reports that she does not use drugs. family history includes Cancer in her father. Allergies  Allergen Reactions  . Codeine     GI upset  . Morphine And Related     GI upset     Review of Systems  Constitutional: Negative for fatigue.  Eyes: Negative  for visual disturbance.  Respiratory: Negative for cough, chest tightness, shortness of breath and wheezing.   Cardiovascular: Positive for leg swelling. Negative for chest pain and palpitations.  Neurological: Negative for dizziness, seizures, syncope, weakness, light-headedness and headaches.       Objective:   Physical Exam  Constitutional: She appears well-developed and well-nourished.  Cardiovascular: Normal rate and regular rhythm.   Pulmonary/Chest: Effort normal and breath sounds normal. No respiratory distress. She has no wheezes. She has no rales.  Musculoskeletal: She exhibits edema.  She  has some foot and ankle edema left greater than right       Assessment:       #1 hypertension stable  #2 mild foot and ankle edema- unchanged with discontinuation of Amlodipine. Plan:     -Try leaving off valsartan since she does not have any compelling other indications such as diabetes or systolic heart failure -Follow-up in one month to reassess blood pressure -Elevate feet and legs frequently and continue low-dose furosemide -Flu vaccine offered and declined -Check basic metabolic panel at follow-up  Eulas Post MD [facility] at [facility]

## 2016-01-27 LAB — CUP PACEART REMOTE DEVICE CHECK
Battery Impedance: 179 Ohm
Battery Remaining Longevity: 114 mo
Battery Voltage: 2.79 V
Brady Statistic AP VP Percent: 0 %
Brady Statistic AP VS Percent: 0 %
Brady Statistic AS VP Percent: 100 %
Brady Statistic AS VS Percent: 0 %
Date Time Interrogation Session: 20170912142510
Implantable Lead Implant Date: 20150120
Implantable Lead Implant Date: 20150120
Implantable Lead Location: 753859
Implantable Lead Location: 753860
Implantable Lead Model: 5076
Implantable Lead Model: 5076
Lead Channel Impedance Value: 481 Ohm
Lead Channel Impedance Value: 528 Ohm
Lead Channel Pacing Threshold Amplitude: 0.375 V
Lead Channel Pacing Threshold Amplitude: 1 V
Lead Channel Pacing Threshold Pulse Width: 0.4 ms
Lead Channel Pacing Threshold Pulse Width: 0.4 ms
Lead Channel Setting Pacing Amplitude: 2 V
Lead Channel Setting Pacing Amplitude: 2.5 V
Lead Channel Setting Pacing Pulse Width: 0.4 ms
Lead Channel Setting Sensing Sensitivity: 2.8 mV

## 2016-02-03 ENCOUNTER — Encounter: Payer: Self-pay | Admitting: Cardiology

## 2016-02-12 ENCOUNTER — Encounter (HOSPITAL_COMMUNITY): Payer: Self-pay

## 2016-02-12 ENCOUNTER — Emergency Department (HOSPITAL_COMMUNITY)
Admission: EM | Admit: 2016-02-12 | Discharge: 2016-02-12 | Disposition: A | Discharge status: Home / Self Care | Payer: Medicare Other | Attending: Emergency Medicine | Admitting: Emergency Medicine

## 2016-02-12 ENCOUNTER — Emergency Department (HOSPITAL_COMMUNITY): Payer: Medicare Other

## 2016-02-12 DIAGNOSIS — I2510 Atherosclerotic heart disease of native coronary artery without angina pectoris: Secondary | ICD-10-CM | POA: Diagnosis not present

## 2016-02-12 DIAGNOSIS — S52514A Nondisplaced fracture of right radial styloid process, initial encounter for closed fracture: Secondary | ICD-10-CM | POA: Diagnosis not present

## 2016-02-12 DIAGNOSIS — Y999 Unspecified external cause status: Secondary | ICD-10-CM | POA: Diagnosis not present

## 2016-02-12 DIAGNOSIS — I10 Essential (primary) hypertension: Secondary | ICD-10-CM | POA: Insufficient documentation

## 2016-02-12 DIAGNOSIS — W1839XA Other fall on same level, initial encounter: Secondary | ICD-10-CM | POA: Diagnosis not present

## 2016-02-12 DIAGNOSIS — Y92009 Unspecified place in unspecified non-institutional (private) residence as the place of occurrence of the external cause: Secondary | ICD-10-CM | POA: Insufficient documentation

## 2016-02-12 DIAGNOSIS — S52614A Nondisplaced fracture of right ulna styloid process, initial encounter for closed fracture: Secondary | ICD-10-CM | POA: Insufficient documentation

## 2016-02-12 DIAGNOSIS — S52591A Other fractures of lower end of right radius, initial encounter for closed fracture: Secondary | ICD-10-CM | POA: Insufficient documentation

## 2016-02-12 DIAGNOSIS — S59911A Unspecified injury of right forearm, initial encounter: Secondary | ICD-10-CM | POA: Diagnosis present

## 2016-02-12 DIAGNOSIS — Y939 Activity, unspecified: Secondary | ICD-10-CM | POA: Diagnosis not present

## 2016-02-12 DIAGNOSIS — S52601A Unspecified fracture of lower end of right ulna, initial encounter for closed fracture: Secondary | ICD-10-CM | POA: Diagnosis not present

## 2016-02-12 MED ORDER — TRAMADOL HCL 50 MG PO TABS: 25.0000 mg | ORAL_TABLET | Freq: Two times a day (BID) | ORAL | 0 refills | Status: DC | PRN

## 2016-02-12 NOTE — ED Triage Notes (Signed)
She reports falling at home yesterday evening "I just fell, I didn't pass out". Today she (only) c/o right wrist pain and swelling. She is in no distress.

## 2016-02-12 NOTE — ED Notes (Signed)
Discharge instructions, follow up care, and rx x 1 reviewed with patient and patient's son. Patient and son verbalized understanding.

## 2016-02-12 NOTE — ED Notes (Signed)
MD at bedside. 

## 2016-02-12 NOTE — ED Provider Notes (Signed)
[place] DEPT Provider Note   CSN: [id number] Arrival date & time: 02/12/16  1041     History   Chief Complaint Chief Complaint  Patient presents with  . Fall  . Arm Injury    HPI Theresa Norton is a 82 y.o. female.  HPI  History supplemented by her son. The patient drinks a lot of alcohol and was "significantly intoxicated" yesterday.  The patient was intoxicated as early as 6:00 when he got home from work and continue to drink after that. Patient fell last night and woke up this morning with right wrist pain sitting here for evaluation. She knows that she did not pass out but does not remember specific details are on the fall. She is not hurt anywhere else. No other symptoms. No other modifying factors. No other associated symptoms.  Past Medical History:  Diagnosis Date  . Allergy   . Anemia   . Arthritis    "in my fingers" (07/08/2013)  . CAD (coronary artery disease)    a. 05/2013 nonobs dzs by cath.  . Colon polyps   . Groin hematoma    a. 05/2013 R groin hematoma post-cath - u/s 05/31/13 large 7.49 cm r inguinal hematoma extending into thigh, no psa or avf.  . H/O hiatal hernia   . Heart block AV second degree    a. s/p MDT Addapta dual chamber pacemaker 05/2013 by Dr Rayann Heman.  . Hypertension   . On home oxygen therapy    "2L maybe 12h/day" (07/08/2013)  . Pacemaker   . Pacemaker   . PAF (paroxysmal atrial fibrillation) ([place])    a. Found post-pacer 05/2013->Xarelto added;  b. 05/2013 Echo: EF 60-65%, mild LVH, nl wall motion w/o rwma.    Patient Active Problem List   Diagnosis Date Noted  . Edema 05/11/2014  . PMR (polymyalgia rheumatica) ([place]) 09/04/2013  . Pleural effusion 07/07/2013  . Anemia, iron deficiency 06/26/2013  . Hyponatremia 06/26/2013  . Displacement of pacemaker electrode lead 06/13/2013  . Pericardial effusion with cardiac tamponade 06/11/2013  . AF (atrial fibrillation) ([place]) 06/05/2013  . Complete heart block ([place]) 05/26/2013  .  Obesity (BMI 30-39.9) 04/22/2013  . Wenckebach second degree AV block 04/23/2012  . Hypertension 04/17/2012  . Elevated blood pressure 04/10/2012  . History of colon polyps 04/10/2012    Past Surgical History:  Procedure Laterality Date  . BREAST CYST EXCISION Right 1959; 1970's  . BUNIONECTOMY Bilateral 1967  . CARDIAC CATHETERIZATION    . CATARACT EXTRACTION W/ INTRAOCULAR LENS  IMPLANT, BILATERAL Bilateral   . DILATION AND CURETTAGE OF UTERUS    . INSERT / REPLACE / REMOVE PACEMAKER  05/2013   MDT ADDRL1 implanted by Dr Rayann Heman for heart block  . INSERT / REPLACE / REMOVE PACEMAKER  06/2013   Right ventricular lead perforation with tamponade  . LEAD REVISION N/A 06/13/2013   Procedure: LEAD REVISION;  Surgeon: Coralyn Mark, MD;  Location: [facility] CATH LAB;  Service: Cardiovascular;  Laterality: N/A;  . LEFT HEART CATHETERIZATION WITH CORONARY ANGIOGRAM N/A 05/27/2013   Procedure: LEFT HEART CATHETERIZATION WITH CORONARY ANGIOGRAM;  Surgeon: Birdie Riddle, MD;  Location: [place] CATH LAB;  Service: Cardiovascular;  Laterality: N/A;  . PERICARDIAL TAP N/A 06/11/2013   Procedure: PERICARDIAL TAP;  Surgeon: Birdie Riddle, MD;  Location: [facility] CATH LAB;  Service: Cardiovascular;  Laterality: N/A;  . PERMANENT PACEMAKER INSERTION N/A 05/27/2013   Procedure: PERMANENT PACEMAKER INSERTION;  Surgeon: Coralyn Mark, MD;  Location: [facility] CATH  LAB;  Service: Cardiovascular;  Laterality: N/A;  . TEMPORARY PACEMAKER INSERTION N/A 05/27/2013   Procedure: TEMPORARY PACEMAKER INSERTION;  Surgeon: Birdie Riddle, MD;  Location: [facility] CATH LAB;  Service: Cardiovascular;  Laterality: N/A;  . VARICOSE VEIN SURGERY Bilateral ~ 2002    OB History    No data available       Home Medications    Prior to Admission medications   Medication Sig Start Date End Date Taking? Authorizing Provider  acetaminophen (TYLENOL) 325 MG tablet Take 325 mg by mouth every 6 (six) hours as needed for mild pain.    Historical Provider, MD    beta carotene w/minerals (OCUVITE) tablet Take 1 tablet by mouth daily.    Historical Provider, MD  dextromethorphan-guaiFENesin (MUCINEX DM) 30-600 MG 12hr tablet Take 1 tablet by mouth 2 (two) times daily as needed for cough.    Historical Provider, MD  furosemide (LASIX) 20 MG tablet Take 20 mg by mouth daily.    Historical Provider, MD  loratadine (CLARITIN) 10 MG tablet Take 10 mg by mouth daily as needed for allergies.     Historical Provider, MD  potassium chloride (K-DUR,KLOR-CON) 10 MEQ tablet Take 10 mEq by mouth daily. 07/20/13   Historical Provider, MD  traMADol (ULTRAM) 50 MG tablet Take 0.5-1 tablets (25-50 mg total) by mouth every 12 (twelve) hours as needed for severe pain. 02/12/16   Merrily Pew, MD    Family History Family History  Problem Relation Age of Onset  . Cancer Father     lung    Social History Social History  Substance Use Topics  . Smoking status: Never Smoker  . Smokeless tobacco: Never Used  . Alcohol use 8.4 oz/week    7 Glasses of wine, 7 Cans of beer per week     Comment: 07/08/2013 "2 glasses of wine or beer q every day or more     Allergies   Codeine and Morphine and related   Review of Systems Review of Systems  Musculoskeletal:       Right wrist pain  All other systems reviewed and are negative.    Physical Exam Updated Vital Signs BP 192/100 (BP Location: Left Arm) Comment: MD aware of blood pressure  Pulse 93   Temp 97.6 F (36.4 C) (Oral)   Resp 18   SpO2 94%   Physical Exam  Constitutional: She is oriented to person, place, and time. She appears well-developed and well-nourished.  HENT:  Head: Normocephalic and atraumatic.  Eyes: Conjunctivae and EOM are normal.  Neck: Normal range of motion.  Cardiovascular: Normal rate and regular rhythm.   No murmur heard. Pulmonary/Chest: No stridor. No respiratory distress.  Abdominal: She exhibits no distension.  Musculoskeletal: She exhibits edema and tenderness (right wrist,  worse with ROM). She exhibits no deformity.  Neurological: She is alert and oriented to person, place, and time.  No altered mental status, able to give full seemingly accurate history.  Face is symmetric, EOM's intact, pupils equal and reactive, vision intact, tongue and uvula midline without deviation Upper and Lower extremity motor 5/5, intact pain perception in distal extremities, 2+ reflexes in biceps, patella and achilles tendons. Finger to nose normal, heel to shin normal. Walks without assistance or evident ataxia.   Skin: No erythema.  Nursing note and vitals reviewed.    ED Treatments / Results  Labs (all labs ordered are listed, but only abnormal results are displayed) Labs Reviewed - No data to display  EKG  EKG Interpretation None       Radiology Dg Wrist Complete Right  Result Date: 02/12/2016 CLINICAL DATA:  [place] yesterday.  Pain. EXAM: RIGHT WRIST - COMPLETE 3+ VIEW COMPARISON:  None. FINDINGS: Nondisplaced fracture of the distal radius, slight impaction. Small nondisplaced ulnar styloid fracture. Significant loss of joint space, due to chronic arthritis, radiocarpal and intercarpal joints. No navicular or other carpal fracture. Marked soft tissue swelling. Osteopenia. IMPRESSION: Nondisplaced fracture of the distal radius and ulnar styloid. Soft tissue swelling. Osteoarthritis. Electronically Signed   By: Staci Righter M.D.   On: 02/12/2016 11:35    Procedures Procedures (including critical care time)  Medications Ordered in ED Medications - No data to display   Initial Impression / Assessment and Plan / ED Course  I have reviewed the triage vital signs and the nursing notes.  Pertinent labs & imaging results that were available during my care of the patient were reviewed by me and considered in my medical decision making (see chart for details).  Clinical Course    82 yo F w/ distal radius/ulna fracture likely 2/2 fall likely 2/2 intoxication. [place],  will follow up with hand in about a week. Short course of tramadol rx.   Final Clinical Impressions(s) / ED Diagnoses   Final diagnoses:  Other closed fracture of distal end of right radius, initial encounter    New Prescriptions Discharge Medication List as of 02/12/2016  1:47 PM    START taking these medications   Details  traMADol (ULTRAM) 50 MG tablet Take 0.5-1 tablets (25-50 mg total) by mouth every 12 (twelve) hours as needed for severe pain., Starting Sat 02/12/2016, Print         Merrily Pew, MD 02/12/16 [phone]

## 2016-02-17 DIAGNOSIS — M25531 Pain in right wrist: Secondary | ICD-10-CM | POA: Diagnosis not present

## 2016-02-17 DIAGNOSIS — S52571A Other intraarticular fracture of lower end of right radius, initial encounter for closed fracture: Secondary | ICD-10-CM | POA: Diagnosis not present

## 2016-02-17 DIAGNOSIS — W19XXXA Unspecified fall, initial encounter: Secondary | ICD-10-CM | POA: Diagnosis not present

## 2016-02-17 DIAGNOSIS — Y92009 Unspecified place in unspecified non-institutional (private) residence as the place of occurrence of the external cause: Secondary | ICD-10-CM | POA: Diagnosis not present

## 2016-02-17 DIAGNOSIS — S52501A Unspecified fracture of the lower end of right radius, initial encounter for closed fracture: Secondary | ICD-10-CM | POA: Insufficient documentation

## 2016-02-23 ENCOUNTER — Ambulatory Visit (INDEPENDENT_AMBULATORY_CARE_PROVIDER_SITE_OTHER): Payer: Medicare Other | Admitting: Family Medicine

## 2016-02-23 VITALS — BP 170/98 | HR 94 | Temp 98.1°F | Ht 64.0 in | Wt 176.0 lb

## 2016-02-23 DIAGNOSIS — F320 Major depressive disorder, single episode, mild: Secondary | ICD-10-CM | POA: Diagnosis not present

## 2016-02-23 DIAGNOSIS — F1010 Alcohol abuse, uncomplicated: Secondary | ICD-10-CM | POA: Diagnosis not present

## 2016-02-23 DIAGNOSIS — I10 Essential (primary) hypertension: Secondary | ICD-10-CM | POA: Diagnosis not present

## 2016-02-23 MED ORDER — SERTRALINE HCL 25 MG PO TABS: 25.0000 mg | ORAL_TABLET | Freq: Every day | ORAL | Status: DC

## 2016-02-23 NOTE — Progress Notes (Signed)
Pre visit review using our clinic review tool, if applicable. No additional management support is needed unless otherwise documented below in the visit note. 

## 2016-02-23 NOTE — Progress Notes (Signed)
Subjective:     Patient ID: Theresa Norton, female   DOB: 1928/08/17, 82 y.o.   MRN: [id number]  HPI Patient seen for several issues as follows  Recent fall with right radial fracture. Followed by orthopedist. Minimal arm pain. Hypertension and she had recent low readings and we were able to discontinue both valsartan and amlodipine. However, after her fracture her blood pressure shot up consistently around A999333 systolic and her son started back both Valsartan and amlodipine. She also remains on low-dose furosemide. She's not had any dizziness.  We planned follow-up basic metabolic panel today. Son has concerns about depression. Recent PH Q-9 score of 7. She has low motivation and couple of times expressed to son low motivation to continue living- though she denies any active suicidal ideation this time.  Son is very concerned about her alcohol use. He states she is drinking up to a bottle of wine per day though she denies this. She initially stated she was drinking 4 ounces of wine per day but then stated that she was drinking sometimes considerably more.  Past Medical History:  Diagnosis Date  . Allergy   . Anemia   . Arthritis    "in my fingers" (07/08/2013)  . CAD (coronary artery disease)    a. 05/2013 nonobs dzs by cath.  . Colon polyps   . Groin hematoma    a. 05/2013 R groin hematoma post-cath - u/s 05/31/13 large 7.49 cm r inguinal hematoma extending into thigh, no psa or avf.  . H/O hiatal hernia   . Heart block AV second degree    a. s/p MDT Addapta dual chamber pacemaker 05/2013 by Dr Rayann Heman.  . Hypertension   . On home oxygen therapy    "2L maybe 12h/day" (07/08/2013)  . Pacemaker   . Pacemaker   . PAF (paroxysmal atrial fibrillation) ([place])    a. Found post-pacer 05/2013->Xarelto added;  b. 05/2013 Echo: EF 60-65%, mild LVH, nl wall motion w/o rwma.   Past Surgical History:  Procedure Laterality Date  . BREAST CYST EXCISION Right 1959; 1970's  . BUNIONECTOMY Bilateral 1967   . CARDIAC CATHETERIZATION    . CATARACT EXTRACTION W/ INTRAOCULAR LENS  IMPLANT, BILATERAL Bilateral   . DILATION AND CURETTAGE OF UTERUS    . INSERT / REPLACE / REMOVE PACEMAKER  05/2013   MDT ADDRL1 implanted by Dr Rayann Heman for heart block  . INSERT / REPLACE / REMOVE PACEMAKER  06/2013   Right ventricular lead perforation with tamponade  . LEAD REVISION N/A 06/13/2013   Procedure: LEAD REVISION;  Surgeon: Coralyn Mark, MD;  Location: [facility] CATH LAB;  Service: Cardiovascular;  Laterality: N/A;  . LEFT HEART CATHETERIZATION WITH CORONARY ANGIOGRAM N/A 05/27/2013   Procedure: LEFT HEART CATHETERIZATION WITH CORONARY ANGIOGRAM;  Surgeon: Birdie Riddle, MD;  Location: [facility] CATH LAB;  Service: Cardiovascular;  Laterality: N/A;  . PERICARDIAL TAP N/A 06/11/2013   Procedure: PERICARDIAL TAP;  Surgeon: Birdie Riddle, MD;  Location: [facility] CATH LAB;  Service: Cardiovascular;  Laterality: N/A;  . PERMANENT PACEMAKER INSERTION N/A 05/27/2013   Procedure: PERMANENT PACEMAKER INSERTION;  Surgeon: Coralyn Mark, MD;  Location: [facility] CATH LAB;  Service: Cardiovascular;  Laterality: N/A;  . TEMPORARY PACEMAKER INSERTION N/A 05/27/2013   Procedure: TEMPORARY PACEMAKER INSERTION;  Surgeon: Birdie Riddle, MD;  Location: [place] CATH LAB;  Service: Cardiovascular;  Laterality: N/A;  . VARICOSE VEIN SURGERY Bilateral ~ 2002    reports that she has never smoked. She has never  used smokeless tobacco. She reports that she drinks about 8.4 oz of alcohol per week . She reports that she does not use drugs. family history includes Cancer in her father. Allergies  Allergen Reactions  . Codeine     GI upset  . Morphine And Related     GI upset     Review of Systems  Constitutional: Negative for fatigue.  Eyes: Negative for visual disturbance.  Respiratory: Negative for cough, chest tightness, shortness of breath and wheezing.   Cardiovascular: Negative for chest pain, palpitations and leg swelling.  Neurological: Negative for  dizziness, seizures, syncope, weakness, light-headedness and headaches.       Objective:   Physical Exam  Constitutional: She appears well-developed and well-nourished.  Eyes: Pupils are equal, round, and reactive to light.  Neck: Neck supple. No JVD present. No thyromegaly present.  Cardiovascular: Normal rate and regular rhythm.  Exam reveals no gallop.   Pulmonary/Chest: Effort normal and breath sounds normal. No respiratory distress. She has no wheezes. She has no rales.  Musculoskeletal: She exhibits no edema.  Neurological: She is alert.       Assessment:     #1 hypertension currently poorly controlled  #2 recent right radial fracture  #3 probable depression  #4 alcohol abuse    Plan:     -agree with starting back valsartan and amlodipine. These were just started back couple days ago and will reassess in one month -Consider sertraline 25 mg daily at bedtime -Long discussion regarding alcohol. We strongly recommend scaling back-or better stopping altogether as she has control issues. We also explained that excessive alcohol can make it very difficult to control her blood pressure. -check basic metabolic panel and hepatic panel today  Eulas Post MD  Primary Care at [facility]

## 2016-02-24 LAB — BASIC METABOLIC PANEL
BUN: 18 mg/dL (ref 6–23)
CO2: 26 mEq/L (ref 19–32)
Calcium: 9.4 mg/dL (ref 8.4–10.5)
Chloride: 91 mEq/L — ABNORMAL LOW (ref 96–112)
Creatinine, Ser: 1.04 mg/dL (ref 0.40–1.20)
GFR: 53.47 mL/min — ABNORMAL LOW (ref >60.00)
Glucose, Bld: 94 mg/dL (ref 70–99)
Potassium: 5.1 mEq/L (ref 3.5–5.1)
Sodium: 126 mEq/L — ABNORMAL LOW (ref 135–145)

## 2016-02-24 LAB — HEPATIC FUNCTION PANEL
ALT: 17 U/L (ref 0–35)
AST: 29 U/L (ref 0–37)
Albumin: 4.1 g/dL (ref 3.5–5.2)
Alkaline Phosphatase: 96 U/L (ref 39–117)
Bilirubin, Direct: 0.1 mg/dL (ref 0.0–0.3)
Total Bilirubin: 0.7 mg/dL (ref 0.2–1.2)
Total Protein: 7.5 g/dL (ref 6.0–8.3)

## 2016-03-02 DIAGNOSIS — S52571A Other intraarticular fracture of lower end of right radius, initial encounter for closed fracture: Secondary | ICD-10-CM | POA: Diagnosis not present

## 2016-03-06 DIAGNOSIS — Y999 Unspecified external cause status: Secondary | ICD-10-CM | POA: Diagnosis not present

## 2016-03-06 DIAGNOSIS — G8918 Other acute postprocedural pain: Secondary | ICD-10-CM | POA: Diagnosis not present

## 2016-03-06 DIAGNOSIS — S52571A Other intraarticular fracture of lower end of right radius, initial encounter for closed fracture: Secondary | ICD-10-CM | POA: Diagnosis not present

## 2016-03-09 DIAGNOSIS — S52571A Other intraarticular fracture of lower end of right radius, initial encounter for closed fracture: Secondary | ICD-10-CM | POA: Diagnosis not present

## 2016-03-22 ENCOUNTER — Ambulatory Visit (INDEPENDENT_AMBULATORY_CARE_PROVIDER_SITE_OTHER): Payer: Medicare Other | Admitting: Family Medicine

## 2016-03-22 VITALS — BP 134/80 | HR 100 | Ht 64.0 in | Wt 175.0 lb

## 2016-03-22 DIAGNOSIS — E871 Hypo-osmolality and hyponatremia: Secondary | ICD-10-CM | POA: Diagnosis not present

## 2016-03-22 DIAGNOSIS — F329 Major depressive disorder, single episode, unspecified: Secondary | ICD-10-CM | POA: Diagnosis not present

## 2016-03-22 DIAGNOSIS — R4589 Other symptoms and signs involving emotional state: Secondary | ICD-10-CM

## 2016-03-22 DIAGNOSIS — I10 Essential (primary) hypertension: Secondary | ICD-10-CM | POA: Diagnosis not present

## 2016-03-22 LAB — BASIC METABOLIC PANEL
BUN: 23 mg/dL (ref 6–23)
CO2: 27 mEq/L (ref 19–32)
Calcium: 9.3 mg/dL (ref 8.4–10.5)
Chloride: 97 mEq/L (ref 96–112)
Creatinine, Ser: 1.19 mg/dL (ref 0.40–1.20)
GFR: 45.76 mL/min — ABNORMAL LOW (ref >60.00)
Glucose, Bld: 104 mg/dL — ABNORMAL HIGH (ref 70–99)
Potassium: 5.1 mEq/L (ref 3.5–5.1)
Sodium: 134 mEq/L — ABNORMAL LOW (ref 135–145)

## 2016-03-22 MED ORDER — VALSARTAN 80 MG PO TABS: 80.0000 mg | ORAL_TABLET | Freq: Every day | ORAL | 3 refills | Status: DC

## 2016-03-22 NOTE — Progress Notes (Signed)
Pre visit review using our clinic review tool, if applicable. No additional management support is needed unless otherwise documented below in the visit note. 

## 2016-03-22 NOTE — Progress Notes (Signed)
Subjective:     Patient ID: Theresa Norton, female   DOB: 07-15-1928, 82 y.o.   MRN: [id number]  HPI Patient here for follow-up regarding hypertension.  Recent right radial fracture. Her blood pressure shot up around A999333 systolic and family started back valsartan and and amlodipine. Blood pressures been improved since then. Recent basic metabolic panel sodium [id number]. She does not take any thiazides. She takes low-dose furosemide 20 mg once daily. He does consume alcohol regularly according to son. Does not restrict sodium. No recent generalized weakness or nausea.  Recent depressed mood. We started low-dose sertraline 25 mg once daily. Basically, all of her family has seen significant improvements in her mood. She had been previously making comments about how she didn't see any benefits of living that she was not having any specific suicidal ideation. They have not heard any of these comments over the past couple weeks.  Past Medical History:  Diagnosis Date  . Allergy   . Anemia   . Arthritis    "in my fingers" (07/08/2013)  . CAD (coronary artery disease)    a. 05/2013 nonobs dzs by cath.  . Colon polyps   . Groin hematoma    a. 05/2013 R groin hematoma post-cath - u/s 05/31/13 large 7.49 cm r inguinal hematoma extending into thigh, no psa or avf.  . H/O hiatal hernia   . Heart block AV second degree    a. s/p MDT Addapta dual chamber pacemaker 05/2013 by Dr Rayann Heman.  . Hypertension   . On home oxygen therapy    "2L maybe 12h/day" (07/08/2013)  . Pacemaker   . Pacemaker   . PAF (paroxysmal atrial fibrillation) ([place])    a. Found post-pacer 05/2013->Xarelto added;  b. 05/2013 Echo: EF 60-65%, mild LVH, nl wall motion w/o rwma.   Past Surgical History:  Procedure Laterality Date  . BREAST CYST EXCISION Right 1959; 1970's  . BUNIONECTOMY Bilateral 1967  . CARDIAC CATHETERIZATION    . CATARACT EXTRACTION W/ INTRAOCULAR LENS  IMPLANT, BILATERAL Bilateral   . DILATION AND CURETTAGE OF UTERUS     . INSERT / REPLACE / REMOVE PACEMAKER  05/2013   MDT ADDRL1 implanted by Dr Rayann Heman for heart block  . INSERT / REPLACE / REMOVE PACEMAKER  06/2013   Right ventricular lead perforation with tamponade  . LEAD REVISION N/A 06/13/2013   Procedure: LEAD REVISION;  Surgeon: Coralyn Mark, MD;  Location: [place] CATH LAB;  Service: Cardiovascular;  Laterality: N/A;  . LEFT HEART CATHETERIZATION WITH CORONARY ANGIOGRAM N/A 05/27/2013   Procedure: LEFT HEART CATHETERIZATION WITH CORONARY ANGIOGRAM;  Surgeon: Birdie Riddle, MD;  Location: [facility] CATH LAB;  Service: Cardiovascular;  Laterality: N/A;  . PERICARDIAL TAP N/A 06/11/2013   Procedure: PERICARDIAL TAP;  Surgeon: Birdie Riddle, MD;  Location: [facility] [place] CATH LAB;  Service: Cardiovascular;  Laterality: N/A;  . PERMANENT PACEMAKER INSERTION N/A 05/27/2013   Procedure: PERMANENT PACEMAKER INSERTION;  Surgeon: Coralyn Mark, MD;  Location: [facility] CATH LAB;  Service: Cardiovascular;  Laterality: N/A;  . TEMPORARY PACEMAKER INSERTION N/A 05/27/2013   Procedure: TEMPORARY PACEMAKER INSERTION;  Surgeon: Birdie Riddle, MD;  Location: [facility] CATH LAB;  Service: Cardiovascular;  Laterality: N/A;  . VARICOSE VEIN SURGERY Bilateral ~ 2002    reports that she has never smoked. She has never used smokeless tobacco. She reports that she drinks about 8.4 oz of alcohol per week . She reports that she does not use drugs. family history includes Cancer in her  father. Allergies  Allergen Reactions  . Codeine     GI upset  . Morphine And Related     GI upset     Review of Systems  Constitutional: Negative for fatigue.  Eyes: Negative for visual disturbance.  Respiratory: Negative for cough, chest tightness, shortness of breath and wheezing.   Cardiovascular: Positive for leg swelling. Negative for chest pain and palpitations.  Gastrointestinal: Negative for nausea and vomiting.  Neurological: Negative for dizziness, seizures, syncope, weakness, light-headedness and headaches.   Psychiatric/Behavioral: Negative for dysphoric mood. The patient is not nervous/anxious.        Objective:   Physical Exam  Constitutional: She is oriented to person, place, and time. She appears well-developed and well-nourished.  Neck: Neck supple. No thyromegaly present.  Cardiovascular: Normal rate and regular rhythm.   Pulmonary/Chest: Effort normal and breath sounds normal. No respiratory distress. She has no wheezes. She has no rales.  Musculoskeletal: She exhibits edema.  Wound right forearm appears to be healing well. Her cast is off.  Neurological: She is alert and oriented to person, place, and time.  Psychiatric: She has a normal mood and affect. Her behavior is normal. Judgment and thought content normal.       Assessment:     #1 hypertension stable and at goal  #2 hyponatremia with recent sodium 126. Not on thiazide diuretic. This preceded initiation of sertraline.  May be partly related to excess ETOH use.    #3 depressed mood improved on low-dose sertraline    Plan:     -Recheck basic metabolic panel -Reduce alcohol consumption. We have discussed this many times -Continue current blood pressure medications -Continue low-dose sertraline, unless her sodium is dropping -Routine follow-up in 3 months  Eulas Post MD [facility] at [facility]

## 2016-04-04 DIAGNOSIS — S52571D Other intraarticular fracture of lower end of right radius, subsequent encounter for closed fracture with routine healing: Secondary | ICD-10-CM | POA: Diagnosis not present

## 2016-04-04 DIAGNOSIS — S52571A Other intraarticular fracture of lower end of right radius, initial encounter for closed fracture: Secondary | ICD-10-CM | POA: Diagnosis not present

## 2016-04-13 ENCOUNTER — Other Ambulatory Visit: Payer: Self-pay

## 2016-04-17 ENCOUNTER — Other Ambulatory Visit: Payer: Self-pay | Admitting: Family Medicine

## 2016-04-18 ENCOUNTER — Ambulatory Visit (INDEPENDENT_AMBULATORY_CARE_PROVIDER_SITE_OTHER): Payer: Medicare Other | Admitting: *Deleted

## 2016-04-18 DIAGNOSIS — I442 Atrioventricular block, complete: Secondary | ICD-10-CM

## 2016-04-18 NOTE — Progress Notes (Signed)
Remote pacemaker transmission.   

## 2016-04-25 DIAGNOSIS — S52571D Other intraarticular fracture of lower end of right radius, subsequent encounter for closed fracture with routine healing: Secondary | ICD-10-CM | POA: Diagnosis not present

## 2016-04-25 DIAGNOSIS — S52571A Other intraarticular fracture of lower end of right radius, initial encounter for closed fracture: Secondary | ICD-10-CM | POA: Diagnosis not present

## 2016-04-26 ENCOUNTER — Encounter: Payer: Self-pay | Admitting: Cardiology

## 2016-04-28 ENCOUNTER — Encounter: Payer: Self-pay | Admitting: Internal Medicine

## 2016-05-02 LAB — CUP PACEART REMOTE DEVICE CHECK
Battery Impedance: 203 Ohm
Battery Remaining Longevity: 110 mo
Battery Voltage: 2.8 V
Brady Statistic AP VP Percent: 0 %
Brady Statistic AP VS Percent: 0 %
Brady Statistic AS VP Percent: 100 %
Brady Statistic AS VS Percent: 0 %
Date Time Interrogation Session: 20171212154020
Implantable Lead Implant Date: 20150120
Implantable Lead Implant Date: 20150120
Implantable Lead Location: 753859
Implantable Lead Location: 753860
Implantable Lead Model: 5076
Implantable Lead Model: 5076
Implantable Pulse Generator Implant Date: 20150120
Lead Channel Impedance Value: 468 Ohm
Lead Channel Impedance Value: 516 Ohm
Lead Channel Pacing Threshold Amplitude: 0.375 V
Lead Channel Pacing Threshold Amplitude: 0.875 V
Lead Channel Pacing Threshold Pulse Width: 0.4 ms
Lead Channel Pacing Threshold Pulse Width: 0.4 ms
Lead Channel Sensing Intrinsic Amplitude: 1.4 mV
Lead Channel Setting Pacing Amplitude: 2 V
Lead Channel Setting Pacing Amplitude: 2.5 V
Lead Channel Setting Pacing Pulse Width: 0.4 ms
Lead Channel Setting Sensing Sensitivity: 2.8 mV

## 2016-05-11 ENCOUNTER — Telehealth: Payer: Self-pay | Admitting: Family Medicine

## 2016-05-11 NOTE — Telephone Encounter (Signed)
Theresa Norton is calling to see if the [facility] form was complete they are needing it so that they can complete the transition to move in.

## 2016-05-11 NOTE — Telephone Encounter (Signed)
Tried [employer] with NA. [place] placed form on your desk in [employer].

## 2016-05-12 ENCOUNTER — Encounter: Payer: Self-pay | Admitting: Cardiology

## 2016-05-14 DIAGNOSIS — Z0279 Encounter for issue of other medical certificate: Secondary | ICD-10-CM

## 2016-05-14 NOTE — Telephone Encounter (Signed)
Completed.

## 2016-05-15 NOTE — Telephone Encounter (Signed)
Put in scan box for front office staff to call facility.

## 2016-05-15 NOTE — Telephone Encounter (Signed)
Called [name] to pick up the forms.(njr)

## 2016-05-19 ENCOUNTER — Encounter: Payer: Medicare Other | Admitting: Internal Medicine

## 2016-06-07 ENCOUNTER — Encounter: Payer: Medicare Other | Admitting: Internal Medicine

## 2016-06-21 ENCOUNTER — Encounter: Payer: Self-pay | Admitting: Internal Medicine

## 2016-06-21 ENCOUNTER — Ambulatory Visit (INDEPENDENT_AMBULATORY_CARE_PROVIDER_SITE_OTHER): Payer: Medicare Other | Admitting: Internal Medicine

## 2016-06-21 VITALS — BP 152/82 | HR 55 | Ht 63.0 in | Wt 173.6 lb

## 2016-06-21 DIAGNOSIS — I442 Atrioventricular block, complete: Secondary | ICD-10-CM

## 2016-06-21 DIAGNOSIS — Z950 Presence of cardiac pacemaker: Secondary | ICD-10-CM

## 2016-06-21 DIAGNOSIS — I10 Essential (primary) hypertension: Secondary | ICD-10-CM

## 2016-06-21 NOTE — Progress Notes (Signed)
PCP: Eulas Post, MD  Theresa Norton is a 83 y.o. female who presents today for electrophysiology followup.   She has done reasonably well since I saw her last.   She has chronic edema but feels that this improved with tumeric.  She has stopped her lasix.   Today, she denies symptoms of palpitations, chest pain,  presyncope, or syncope.  The patient is otherwise without complaint today.   Past Medical History:  Diagnosis Date  . Allergy   . Anemia   . Arthritis    "in my fingers" (07/08/2013)  . CAD (coronary artery disease)    a. 05/2013 nonobs dzs by cath.  . Colon polyps   . Groin hematoma    a. 05/2013 R groin hematoma post-cath - u/s 05/31/13 large 7.49 cm r inguinal hematoma extending into thigh, no psa or avf.  . H/O hiatal hernia   . Heart block AV second degree    a. s/p MDT Addapta dual chamber pacemaker 05/2013 by Dr Rayann Heman.  . Hypertension   . On home oxygen therapy    "2L maybe 12h/day" (07/08/2013)  . Pacemaker   . Pacemaker   . PAF (paroxysmal atrial fibrillation) ([place])    a. Found post-pacer 05/2013->Xarelto added;  b. 05/2013 Echo: EF 60-65%, mild LVH, nl wall motion w/o rwma.   Past Surgical History:  Procedure Laterality Date  . BREAST CYST EXCISION Right 1959; 1970's  . BUNIONECTOMY Bilateral 1967  . CARDIAC CATHETERIZATION    . CATARACT EXTRACTION W/ INTRAOCULAR LENS  IMPLANT, BILATERAL Bilateral   . DILATION AND CURETTAGE OF UTERUS    . INSERT / REPLACE / REMOVE PACEMAKER  05/2013   MDT ADDRL1 implanted by Dr Rayann Heman for heart block  . INSERT / REPLACE / REMOVE PACEMAKER  06/2013   Right ventricular lead perforation with tamponade  . LEAD REVISION N/A 06/13/2013   Procedure: LEAD REVISION;  Surgeon: Coralyn Mark, MD;  Location: [place] C[facility];  Service: Cardiovascular;  Laterality: N/A;  . LEFT HEART CATHETERIZATION WITH CORONARY ANGIOGRAM N/A 05/27/2013   Procedure: LEFT HEART CATHETERIZATION WITH CORONARY ANGIOGRAM;  Surgeon: Birdie Riddle, MD;  Location:  [place] CATH LAB;  Service: Cardiovascular;  Laterality: N/A;  . PERICARDIAL TAP N/A 06/11/2013   Procedure: PERICARDIAL TAP;  Surgeon: Birdie Riddle, MD;  Location: [place] CATH LAB;  Service: Cardiovascular;  Laterality: N/A;  . PERMANENT PACEMAKER INSERTION N/A 05/27/2013   Procedure: PERMANENT PACEMAKER INSERTION;  Surgeon: Coralyn Mark, MD;  Location: [place] CATH LAB;  Service: Cardiovascular;  Laterality: N/A;  . TEMPORARY PACEMAKER INSERTION N/A 05/27/2013   Procedure: TEMPORARY PACEMAKER INSERTION;  Surgeon: Birdie Riddle, MD;  Location: [facility] CATH LAB;  Service: Cardiovascular;  Laterality: N/A;  . VARICOSE VEIN SURGERY Bilateral ~ 2002    Current Outpatient Prescriptions  Medication Sig Dispense Refill  . acetaminophen (TYLENOL) 325 MG tablet Take 325 mg by mouth every 6 (six) hours as needed for mild pain.    Marland Kitchen amLODipine (NORVASC) 2.5 MG tablet Take 2.5 mg by mouth daily.    . beta carotene w/minerals (OCUVITE) tablet Take 1 tablet by mouth daily.    Marland Kitchen dextromethorphan-guaiFENesin (MUCINEX DM) 30-600 MG 12hr tablet Take 1 tablet by mouth 2 (two) times daily as needed for cough.    . loratadine (CLARITIN) 10 MG tablet Take 10 mg by mouth daily as needed for allergies.     Marland Kitchen sertraline (ZOLOFT) 25 MG tablet TAKE 1 TABLET BY MOUTH DAILY 30 tablet  11  . TURMERIC PO Take 1 capsule by mouth daily.     . valsartan (DIOVAN) 80 MG tablet Take 1 tablet (80 mg total) by mouth daily. 90 tablet 3   No current facility-administered medications for this visit.    ROS-  See HPI, all other systems are reviewed and negative except as per HPI above  Physical Exam: Vitals:   06/21/16 1207  BP: (!) 152/82  Pulse: (!) 55  Weight: 173 lb 9.6 oz (78.7 kg)  Height: 5\' 3"  (1.6 m)    GEN- The patient is elderly appearing, alert and oriented x 3 today.   Head- normocephalic, atraumatic Eyes-  Sclera clear, conjunctiva pink Ears- hearing intact Oropharynx- clear Lungs- Clear to ausculation bilaterally, normal  work of breathing Chest- pacemaker pocket is well healed Heart- Regular rate and rhythm, no murmurs, rubs or gallops, PMI not laterally displaced GI- soft, NT, ND, + BS Extremities- no clubbing, cyanosis, no edema  Pacemaker interrogation- Pacemaker not interrogated today  Assessment and Plan:  1. Complete heart block Normal pacemaker function See Pace Art report No changes today  2. edema A chronic issue Support hose advised 2 gram sodium restriction  3. Hypertension. Slightly elevated today She does not wish to make changes 2 gram sodium diet encouraged No change required today  Return to see EP PA-C every 6 months and I will see when needed carelink  Thompson Grayer MD, [facility] 06/21/2016 12:26 PM

## 2016-06-21 NOTE — Patient Instructions (Addendum)
Medication Instructions:  Your physician recommends that you continue on your current medications as directed. Please refer to the Current Medication list given to you today.   Labwork: None Ordered  Testing/Procedures: None Ordered  Follow-Up: Remote monitoring is used to monitor your Pacemaker from home. This monitoring reduces the number of office visits required to check your device to one time per year. It allows [place] to keep an eye on the functioning of your device to ensure it is working properly. You are scheduled for a device check from home on 09/20/16. You may send your transmission at any time that day. If you have a wireless device, the transmission will be sent automatically. After your physician reviews your transmission, you will receive a postcard with your next transmission date.  Your physician wants you to follow-up in 6 MONTHS with Tommye Standard, PA. You will receive a reminder letter in the mail two months in advance. If you don't receive a letter, please call our office to schedule the follow-up appointment.   Any Other Special Instructions Will Be Listed Below (If Applicable).   Low-Sodium Eating Plan Sodium raises blood pressure and causes water to be held in the body. Getting less sodium from food will help lower your blood pressure, reduce any swelling, and protect your heart, liver, and kidneys. We get sodium by adding salt (sodium chloride) to food. Most of our sodium comes from canned, boxed, and frozen foods. Restaurant foods, fast foods, and pizza are also very high in sodium. Even if you take medicine to lower your blood pressure or to reduce fluid in your body, getting less sodium from your food is important. What is my plan? Most people should limit their sodium intake to 2,300 mg a day. Your health care provider recommends that you limit your sodium intake to __2000 mg__ a day. What do I need to know about this eating plan? For the low-sodium eating plan, you  will follow these general guidelines:  Choose foods with a % Daily Value for sodium of less than 5% (as listed on the food label).  Use salt-free seasonings or herbs instead of table salt or sea salt.  Check with your health care provider or pharmacist before using salt substitutes.  Eat fresh foods.  Eat more vegetables and fruits.  Limit canned vegetables. If you do use them, rinse them well to decrease the sodium.  Limit cheese to 1 oz (28 g) per day.  Eat lower-sodium products, often labeled as "lower sodium" or "no salt added."  Avoid foods that contain monosodium glutamate (MSG). MSG is sometimes added to [place] food and some canned foods.  Check food labels (Nutrition Facts labels) on foods to learn how much sodium is in one serving.  Eat more home-cooked food and less restaurant, buffet, and fast food.  When eating at a restaurant, ask that your food be prepared with less salt, or no salt if possible. How do I read food labels for sodium information? The Nutrition Facts label lists the amount of sodium in one serving of the food. If you eat more than one serving, you must multiply the listed amount of sodium by the number of servings. Food labels may also identify foods as:  Sodium free-Less than 5 mg in a serving.  Very low sodium-35 mg or less in a serving.  Low sodium-140 mg or less in a serving.  Light in sodium-50% less sodium in a serving. For example, if a food that usually has 300 mg  of sodium is changed to become light in sodium, it will have 150 mg of sodium.  Reduced sodium-25% less sodium in a serving. For example, if a food that usually has 400 mg of sodium is changed to reduced sodium, it will have 300 mg of sodium. What foods can I eat? Grains  Low-sodium cereals, including oats, puffed wheat and rice, and shredded wheat cereals. Low-sodium crackers. Unsalted rice and pasta. Lower-sodium bread. Vegetables  Frozen or fresh vegetables. Low-sodium or  reduced-sodium canned vegetables. Low-sodium or reduced-sodium tomato sauce and paste. Low-sodium or reduced-sodium tomato and vegetable juices. Fruits  Fresh, frozen, and canned fruit. Fruit juice. Meat and Other Protein Products  Low-sodium canned tuna and salmon. Fresh or frozen meat, poultry, seafood, and fish. Lamb. Unsalted nuts. Dried beans, peas, and lentils without added salt. Unsalted canned beans. Homemade soups without salt. Eggs. Dairy  Milk. Soy milk. Ricotta cheese. Low-sodium or reduced-sodium cheeses. Yogurt. Condiments  Fresh and dried herbs and spices. Salt-free seasonings. Onion and garlic powders. Low-sodium varieties of mustard and ketchup. Fresh or refrigerated horseradish. Lemon juice. Fats and Oils  Reduced-sodium salad dressings. Unsalted butter. Other  Unsalted popcorn and pretzels. The items listed above may not be a complete list of recommended foods or beverages. Contact your dietitian for more options.  What foods are not recommended? Grains  Instant hot cereals. Bread stuffing, pancake, and biscuit mixes. Croutons. Seasoned rice or pasta mixes. Noodle soup cups. Boxed or frozen macaroni and cheese. Self-rising flour. Regular salted crackers. Vegetables  Regular canned vegetables. Regular canned tomato sauce and paste. Regular tomato and vegetable juices. Frozen vegetables in sauces. Salted [place] fries. Olives. Angie Fava. Relishes. Sauerkraut. Salsa. Meat and Other Protein Products  Salted, canned, smoked, spiced, or pickled meats, seafood, or fish. Bacon, ham, sausage, hot dogs, corned beef, chipped beef, and packaged luncheon meats. Salt pork. Jerky. Pickled herring. Anchovies, regular canned tuna, and sardines. Salted nuts. Dairy  Processed cheese and cheese spreads. Cheese curds. Blue cheese and cottage cheese. Buttermilk. Condiments  Onion and garlic salt, seasoned salt, table salt, and sea salt. Canned and packaged gravies. Worcestershire sauce. Tartar  sauce. Barbecue sauce. Teriyaki sauce. Soy sauce, including reduced sodium. Steak sauce. Fish sauce. Oyster sauce. Cocktail sauce. Horseradish that you find on the shelf. Regular ketchup and mustard. Meat flavorings and tenderizers. Bouillon cubes. Hot sauce. Tabasco sauce. Marinades. Taco seasonings. Relishes. Fats and Oils  Regular salad dressings. Salted butter. Margarine. Ghee. Bacon fat. Other  Potato and tortilla chips. Corn chips and puffs. Salted popcorn and pretzels. Canned or dried soups. Pizza. Frozen entrees and pot pies. The items listed above may not be a complete list of foods and beverages to avoid. Contact your dietitian for more information.  This information is not intended to replace advice given to you by your health care provider. Make sure you discuss any questions you have with your health care provider. Document Released: 10/14/2001 Document Revised: 09/30/2015 Document Reviewed: 02/26/2013 Elsevier Interactive Patient Education  2017 [employer].    If you need a refill on your cardiac medications before your next appointment, please call your pharmacy.

## 2016-06-23 ENCOUNTER — Ambulatory Visit (INDEPENDENT_AMBULATORY_CARE_PROVIDER_SITE_OTHER): Payer: Medicare Other | Admitting: Family Medicine

## 2016-06-23 VITALS — BP 120/80 | HR 81 | Ht 63.0 in | Wt 171.6 lb

## 2016-06-23 DIAGNOSIS — I442 Atrioventricular block, complete: Secondary | ICD-10-CM

## 2016-06-23 DIAGNOSIS — I10 Essential (primary) hypertension: Secondary | ICD-10-CM | POA: Diagnosis not present

## 2016-06-23 DIAGNOSIS — R2689 Other abnormalities of gait and mobility: Secondary | ICD-10-CM | POA: Diagnosis not present

## 2016-06-23 NOTE — Progress Notes (Signed)
Pre visit review using our clinic review tool, if applicable. No additional management support is needed unless otherwise documented below in the visit note. 

## 2016-06-23 NOTE — Patient Instructions (Signed)
We will set up physical therapy and speech therapy.

## 2016-06-23 NOTE — Progress Notes (Signed)
Subjective:     Patient ID: Theresa Norton, female   DOB: 1928-10-30, 83 y.o.   MRN: [id number]  HPI Follow-up hypertension. She is maintained on valsartan 80 mg daily. Blood pressures have been stable. We recently discontinued furosemide and her weight has been stable and she's not had any recent peripheral edema. Her son is with her today. He's had some concerns regarding some balance issues. She moved to [place] about one month ago. He would like to explore the idea of getting some speech therapy and physical therapy there for her.  She denies any recent fall over the past couple months. Son is very concerned about alcohol use but she states she is drinking less than 10 ounces per day-of wine. He states this is more. Recent hepatic panel was normal.  Past Medical History:  Diagnosis Date  . Allergy   . Anemia   . Arthritis    "in my fingers" (07/08/2013)  . CAD (coronary artery disease)    a. 05/2013 nonobs dzs by cath.  . Colon polyps   . Groin hematoma    a. 05/2013 R groin hematoma post-cath - u/s 05/31/13 large 7.49 cm r inguinal hematoma extending into thigh, no psa or avf.  . H/O hiatal hernia   . Heart block AV second degree    a. s/p MDT Addapta dual chamber pacemaker 05/2013 by Dr Rayann Heman.  . Hypertension   . On home oxygen therapy    "2L maybe 12h/day" (07/08/2013)  . Pacemaker   . Pacemaker   . PAF (paroxysmal atrial fibrillation) ([place])    a. Found post-pacer 05/2013->Xarelto added;  b. 05/2013 Echo: EF 60-65%, mild LVH, nl wall motion w/o rwma.   Past Surgical History:  Procedure Laterality Date  . BREAST CYST EXCISION Right 1959; 1970's  . BUNIONECTOMY Bilateral 1967  . CARDIAC CATHETERIZATION    . CATARACT EXTRACTION W/ INTRAOCULAR LENS  IMPLANT, BILATERAL Bilateral   . DILATION AND CURETTAGE OF UTERUS    . INSERT / REPLACE / REMOVE PACEMAKER  05/2013   MDT ADDRL1 implanted by Dr Rayann Heman for heart block  . INSERT / REPLACE / REMOVE PACEMAKER  06/2013   Right  ventricular lead perforation with tamponade  . LEAD REVISION N/A 06/13/2013   Procedure: LEAD REVISION;  Surgeon: Coralyn Mark, MD;  Location: [place] CATH LAB;  Service: Cardiovascular;  Laterality: N/A;  . LEFT HEART CATHETERIZATION WITH CORONARY ANGIOGRAM N/A 05/27/2013   Procedure: LEFT HEART CATHETERIZATION WITH CORONARY ANGIOGRAM;  Surgeon: Birdie Riddle, MD;  Location: [place] C[facility] LAB;  Service: Cardiovascular;  Laterality: N/A;  . PERICARDIAL TAP N/A 06/11/2013   Procedure: PERICARDIAL TAP;  Surgeon: Birdie Riddle, MD;  Location: [facility] CATH LAB;  Service: Cardiovascular;  Laterality: N/A;  . PERMANENT PACEMAKER INSERTION N/A 05/27/2013   Procedure: PERMANENT PACEMAKER INSERTION;  Surgeon: Coralyn Mark, MD;  Location: [place] C[facility] LAB;  Service: Cardiovascular;  Laterality: N/A;  . TEMPORARY PACEMAKER INSERTION N/A 05/27/2013   Procedure: TEMPORARY PACEMAKER INSERTION;  Surgeon: Birdie Riddle, MD;  Location: [place] C[facility] LAB;  Service: Cardiovascular;  Laterality: N/A;  . VARICOSE VEIN SURGERY Bilateral ~ 2002    reports that she has never smoked. She has never used smokeless tobacco. She reports that she drinks about 8.4 oz of alcohol per week . She reports that she does not use drugs. family history includes Cancer in her father. Allergies  Allergen Reactions  . Codeine     GI upset  . Morphine  And Related     GI upset     Review of Systems  Constitutional: Negative for fatigue.  Eyes: Negative for visual disturbance.  Respiratory: Negative for cough, chest tightness, shortness of breath and wheezing.   Cardiovascular: Negative for chest pain, palpitations and leg swelling.  Neurological: Negative for dizziness, seizures, syncope, weakness, light-headedness and headaches.       Objective:   Physical Exam  Constitutional: She appears well-developed and well-nourished.  Eyes: Pupils are equal, round, and reactive to light.  Neck: Neck supple. No JVD present. No thyromegaly present.   Cardiovascular: Normal rate and regular rhythm.  Exam reveals no gallop.   Pulmonary/Chest: Effort normal and breath sounds normal. No respiratory distress. She has no wheezes. She has no rales.  Musculoskeletal: She exhibits no edema.  Neurological: She is alert.       Assessment:     #1 hypertension stable and at goal  #2 concern for balance issues and risk for fall  #3 history of complete heart block with pacemaker and recent evaluation per cardiology    Plan:     -order written for speech therapy and physical therapy evaluation which they hope to get at [facility] current medications -We discussed importance of alcohol restriction -Routine follow-up in 6 months and repeat basic metabolic panel then  Eulas Post MD [facility] at [facility]

## 2016-06-25 DIAGNOSIS — R2689 Other abnormalities of gait and mobility: Secondary | ICD-10-CM | POA: Insufficient documentation

## 2016-06-28 DIAGNOSIS — R41841 Cognitive communication deficit: Secondary | ICD-10-CM | POA: Diagnosis not present

## 2016-06-30 DIAGNOSIS — R41841 Cognitive communication deficit: Secondary | ICD-10-CM | POA: Diagnosis not present

## 2016-06-30 LAB — CUP PACEART INCLINIC DEVICE CHECK
Battery Impedance: 203 Ohm
Battery Remaining Longevity: 109 mo
Battery Voltage: 2.79 V
Brady Statistic AP VP Percent: 0 %
Brady Statistic AP VS Percent: 0 %
Brady Statistic AS VP Percent: 100 %
Brady Statistic AS VS Percent: 0 %
Date Time Interrogation Session: 20180214182425
Implantable Lead Implant Date: 20150120
Implantable Lead Implant Date: 20150120
Implantable Lead Location: 753859
Implantable Lead Location: 753860
Implantable Lead Model: 5076
Implantable Lead Model: 5076
Implantable Pulse Generator Implant Date: 20150120
Lead Channel Impedance Value: 469 Ohm
Lead Channel Impedance Value: 506 Ohm
Lead Channel Pacing Threshold Amplitude: 0.5 V
Lead Channel Pacing Threshold Amplitude: 1 V
Lead Channel Pacing Threshold Pulse Width: 0.4 ms
Lead Channel Pacing Threshold Pulse Width: 0.4 ms
Lead Channel Sensing Intrinsic Amplitude: 2.8 mV
Lead Channel Setting Pacing Amplitude: 2 V
Lead Channel Setting Pacing Amplitude: 2.5 V
Lead Channel Setting Pacing Pulse Width: 0.4 ms
Lead Channel Setting Sensing Sensitivity: 2.8 mV

## 2016-07-03 ENCOUNTER — Other Ambulatory Visit: Payer: Self-pay | Admitting: Internal Medicine

## 2016-07-03 DIAGNOSIS — R41841 Cognitive communication deficit: Secondary | ICD-10-CM | POA: Diagnosis not present

## 2016-07-05 DIAGNOSIS — R41841 Cognitive communication deficit: Secondary | ICD-10-CM | POA: Diagnosis not present

## 2016-07-06 DIAGNOSIS — R41841 Cognitive communication deficit: Secondary | ICD-10-CM | POA: Diagnosis not present

## 2016-07-06 DIAGNOSIS — R2681 Unsteadiness on feet: Secondary | ICD-10-CM | POA: Diagnosis not present

## 2016-07-06 DIAGNOSIS — R262 Difficulty in walking, not elsewhere classified: Secondary | ICD-10-CM | POA: Diagnosis not present

## 2016-07-07 DIAGNOSIS — R2681 Unsteadiness on feet: Secondary | ICD-10-CM | POA: Diagnosis not present

## 2016-07-07 DIAGNOSIS — R262 Difficulty in walking, not elsewhere classified: Secondary | ICD-10-CM | POA: Diagnosis not present

## 2016-07-07 DIAGNOSIS — R41841 Cognitive communication deficit: Secondary | ICD-10-CM | POA: Diagnosis not present

## 2016-07-10 DIAGNOSIS — R2681 Unsteadiness on feet: Secondary | ICD-10-CM | POA: Diagnosis not present

## 2016-07-10 DIAGNOSIS — R41841 Cognitive communication deficit: Secondary | ICD-10-CM | POA: Diagnosis not present

## 2016-07-10 DIAGNOSIS — R262 Difficulty in walking, not elsewhere classified: Secondary | ICD-10-CM | POA: Diagnosis not present

## 2016-07-12 DIAGNOSIS — R262 Difficulty in walking, not elsewhere classified: Secondary | ICD-10-CM | POA: Diagnosis not present

## 2016-07-12 DIAGNOSIS — R2681 Unsteadiness on feet: Secondary | ICD-10-CM | POA: Diagnosis not present

## 2016-07-12 DIAGNOSIS — R41841 Cognitive communication deficit: Secondary | ICD-10-CM | POA: Diagnosis not present

## 2016-07-13 DIAGNOSIS — R262 Difficulty in walking, not elsewhere classified: Secondary | ICD-10-CM | POA: Diagnosis not present

## 2016-07-13 DIAGNOSIS — R2681 Unsteadiness on feet: Secondary | ICD-10-CM | POA: Diagnosis not present

## 2016-07-13 DIAGNOSIS — R41841 Cognitive communication deficit: Secondary | ICD-10-CM | POA: Diagnosis not present

## 2016-07-14 DIAGNOSIS — R41841 Cognitive communication deficit: Secondary | ICD-10-CM | POA: Diagnosis not present

## 2016-07-14 DIAGNOSIS — R2681 Unsteadiness on feet: Secondary | ICD-10-CM | POA: Diagnosis not present

## 2016-07-14 DIAGNOSIS — R262 Difficulty in walking, not elsewhere classified: Secondary | ICD-10-CM | POA: Diagnosis not present

## 2016-07-17 DIAGNOSIS — R41841 Cognitive communication deficit: Secondary | ICD-10-CM | POA: Diagnosis not present

## 2016-07-17 DIAGNOSIS — R2681 Unsteadiness on feet: Secondary | ICD-10-CM | POA: Diagnosis not present

## 2016-07-17 DIAGNOSIS — R262 Difficulty in walking, not elsewhere classified: Secondary | ICD-10-CM | POA: Diagnosis not present

## 2016-07-18 DIAGNOSIS — R2681 Unsteadiness on feet: Secondary | ICD-10-CM | POA: Diagnosis not present

## 2016-07-18 DIAGNOSIS — R262 Difficulty in walking, not elsewhere classified: Secondary | ICD-10-CM | POA: Diagnosis not present

## 2016-07-18 DIAGNOSIS — R41841 Cognitive communication deficit: Secondary | ICD-10-CM | POA: Diagnosis not present

## 2016-07-19 DIAGNOSIS — R41841 Cognitive communication deficit: Secondary | ICD-10-CM | POA: Diagnosis not present

## 2016-07-19 DIAGNOSIS — R2681 Unsteadiness on feet: Secondary | ICD-10-CM | POA: Diagnosis not present

## 2016-07-19 DIAGNOSIS — R262 Difficulty in walking, not elsewhere classified: Secondary | ICD-10-CM | POA: Diagnosis not present

## 2016-07-25 DIAGNOSIS — R262 Difficulty in walking, not elsewhere classified: Secondary | ICD-10-CM | POA: Diagnosis not present

## 2016-07-25 DIAGNOSIS — R41841 Cognitive communication deficit: Secondary | ICD-10-CM | POA: Diagnosis not present

## 2016-07-25 DIAGNOSIS — R2681 Unsteadiness on feet: Secondary | ICD-10-CM | POA: Diagnosis not present

## 2016-07-26 DIAGNOSIS — R2681 Unsteadiness on feet: Secondary | ICD-10-CM | POA: Diagnosis not present

## 2016-07-26 DIAGNOSIS — R41841 Cognitive communication deficit: Secondary | ICD-10-CM | POA: Diagnosis not present

## 2016-07-26 DIAGNOSIS — R262 Difficulty in walking, not elsewhere classified: Secondary | ICD-10-CM | POA: Diagnosis not present

## 2016-07-27 ENCOUNTER — Telehealth: Payer: Self-pay | Admitting: Family Medicine

## 2016-07-27 NOTE — Telephone Encounter (Signed)
° ° °  Pt son call to ask for a increase in pt  sertraline (ZOLOFT) 25 MG tablet   If there is a suggestion or RX  that deals with on set Alheimers   Theresa Norton  [phone]

## 2016-07-27 NOTE — Telephone Encounter (Signed)
OK to increase Sertraline to 50 mg once daily and recommend office follow up in one month to reassess.

## 2016-07-28 DIAGNOSIS — R262 Difficulty in walking, not elsewhere classified: Secondary | ICD-10-CM | POA: Diagnosis not present

## 2016-07-28 DIAGNOSIS — R41841 Cognitive communication deficit: Secondary | ICD-10-CM | POA: Diagnosis not present

## 2016-07-28 DIAGNOSIS — R2681 Unsteadiness on feet: Secondary | ICD-10-CM | POA: Diagnosis not present

## 2016-07-28 MED ORDER — SERTRALINE HCL 50 MG PO TABS: 50.0000 mg | ORAL_TABLET | Freq: Every day | ORAL | 1 refills | Status: DC

## 2016-07-28 NOTE — Telephone Encounter (Signed)
Rx sent and an appointment made. 

## 2016-07-28 NOTE — Telephone Encounter (Signed)
Theresa Norton please call AL back

## 2016-07-28 NOTE — Telephone Encounter (Signed)
Left message on machine for Al to return our call

## 2016-08-01 DIAGNOSIS — R41841 Cognitive communication deficit: Secondary | ICD-10-CM | POA: Diagnosis not present

## 2016-08-01 DIAGNOSIS — R2681 Unsteadiness on feet: Secondary | ICD-10-CM | POA: Diagnosis not present

## 2016-08-01 DIAGNOSIS — R262 Difficulty in walking, not elsewhere classified: Secondary | ICD-10-CM | POA: Diagnosis not present

## 2016-08-28 ENCOUNTER — Encounter: Payer: Self-pay | Admitting: Family Medicine

## 2016-08-28 ENCOUNTER — Ambulatory Visit (INDEPENDENT_AMBULATORY_CARE_PROVIDER_SITE_OTHER): Payer: Medicare Other | Admitting: Family Medicine

## 2016-08-28 VITALS — BP 110/64 | HR 64 | Temp 97.8°F | Wt 172.5 lb

## 2016-08-28 DIAGNOSIS — R4589 Other symptoms and signs involving emotional state: Secondary | ICD-10-CM

## 2016-08-28 DIAGNOSIS — F329 Major depressive disorder, single episode, unspecified: Secondary | ICD-10-CM | POA: Diagnosis not present

## 2016-08-28 NOTE — Progress Notes (Signed)
Subjective:     Patient ID: Theresa Norton, female   DOB: 06-08-1928, 83 y.o.   MRN: [id number]  HPI Patient seen for follow-up regarding depression issues. Son had requested that we increase her sertraline to 50 mg about a month ago.  His concern was that she does not engaged much with others at her assisted living residence. She tends to stay relatively isolated from other residents. Patient does not feel she is depressed. Son is not sure if they've seen much improvement since going from 25-50 mg sertraline. He had concerns about cognitive decline last year but last June she had Mini-Mental status exam 29/30.  Past Medical History:  Diagnosis Date  . Allergy   . Anemia   . Arthritis    "in my fingers" (07/08/2013)  . CAD (coronary artery disease)    a. 05/2013 nonobs dzs by cath.  . Colon polyps   . Groin hematoma    a. 05/2013 R groin hematoma post-cath - u/s 05/31/13 large 7.49 cm r inguinal hematoma extending into thigh, no psa or avf.  . H/O hiatal hernia   . Heart block AV second degree    a. s/p MDT Addapta dual chamber pacemaker 05/2013 by Dr Rayann Heman.  . Hypertension   . On home oxygen therapy    "2L maybe 12h/day" (07/08/2013)  . Pacemaker   . Pacemaker   . PAF (paroxysmal atrial fibrillation) ([place])    a. Found post-pacer 05/2013->Xarelto added;  b. 05/2013 Echo: EF 60-65%, mild LVH, nl wall motion w/o rwma.   Past Surgical History:  Procedure Laterality Date  . BREAST CYST EXCISION Right 1959; 1970's  . BUNIONECTOMY Bilateral 1967  . CARDIAC CATHETERIZATION    . CATARACT EXTRACTION W/ INTRAOCULAR LENS  IMPLANT, BILATERAL Bilateral   . DILATION AND CURETTAGE OF UTERUS    . INSERT / REPLACE / REMOVE PACEMAKER  05/2013   MDT ADDRL1 implanted by Dr Rayann Heman for heart block  . INSERT / REPLACE / REMOVE PACEMAKER  06/2013   Right ventricular lead perforation with tamponade  . LEAD REVISION N/A 06/13/2013   Procedure: LEAD REVISION;  Surgeon: Coralyn Mark, MD;  Location: [place] CATH LAB;   Service: Cardiovascular;  Laterality: N/A;  . LEFT HEART CATHETERIZATION WITH CORONARY ANGIOGRAM N/A 05/27/2013   Procedure: LEFT HEART CATHETERIZATION WITH CORONARY ANGIOGRAM;  Surgeon: Birdie Riddle, MD;  Location: [place] CATH LAB;  Service: Cardiovascular;  Laterality: N/A;  . PERICARDIAL TAP N/A 06/11/2013   Procedure: PERICARDIAL TAP;  Surgeon: Birdie Riddle, MD;  Location: [place] CATH LAB;  Service: Cardiovascular;  Laterality: N/A;  . PERMANENT PACEMAKER INSERTION N/A 05/27/2013   Procedure: PERMANENT PACEMAKER INSERTION;  Surgeon: Coralyn Mark, MD;  Location: [place] CATH LAB;  Service: Cardiovascular;  Laterality: N/A;  . TEMPORARY PACEMAKER INSERTION N/A 05/27/2013   Procedure: TEMPORARY PACEMAKER INSERTION;  Surgeon: Birdie Riddle, MD;  Location: [place] CATH LAB;  Service: Cardiovascular;  Laterality: N/A;  . VARICOSE VEIN SURGERY Bilateral ~ 2002    reports that she has never smoked. She has never used smokeless tobacco. She reports that she drinks about 8.4 oz of alcohol per week . She reports that she does not use drugs. family history includes Cancer in her father. Allergies  Allergen Reactions  . Codeine     GI upset  . Morphine And Related     GI upset     Review of Systems  Constitutional: Negative for fatigue.  Eyes: Negative for visual disturbance.  Respiratory:  Negative for cough, chest tightness, shortness of breath and wheezing.   Cardiovascular: Negative for chest pain, palpitations and leg swelling.  Neurological: Negative for dizziness, seizures, syncope, weakness, light-headedness and headaches.       Objective:   Physical Exam  Constitutional: She appears well-developed and well-nourished.  Eyes: Pupils are equal, round, and reactive to light.  Neck: Neck supple. No JVD present. No thyromegaly present.  Cardiovascular: Normal rate and regular rhythm.  Exam reveals no gallop.   Pulmonary/Chest: Effort normal and breath sounds normal. No respiratory distress. She has no  wheezes. She has no rales.  Musculoskeletal: She exhibits no edema.  Neurological: She is alert.       Assessment:     Depressed mood. Patient appears to be stable currently on sertraline 50 mg daily    Plan:     -Routine follow-up in 4 months. -Repeat MMSE at that time  Eulas Post MD [facility]

## 2016-08-28 NOTE — Progress Notes (Signed)
Pre visit review using our clinic review tool, if applicable. No additional management support is needed unless otherwise documented below in the visit note. 

## 2016-09-20 ENCOUNTER — Ambulatory Visit (INDEPENDENT_AMBULATORY_CARE_PROVIDER_SITE_OTHER): Payer: Medicare Other | Admitting: *Deleted

## 2016-09-20 ENCOUNTER — Telehealth: Payer: Self-pay | Admitting: Cardiology

## 2016-09-20 DIAGNOSIS — I442 Atrioventricular block, complete: Secondary | ICD-10-CM | POA: Diagnosis not present

## 2016-09-20 NOTE — Telephone Encounter (Signed)
LMOVM reminding pt to send remote transmission.   

## 2016-09-21 ENCOUNTER — Encounter: Payer: Self-pay | Admitting: Cardiology

## 2016-09-26 NOTE — Progress Notes (Signed)
Remote pacemaker transmission.   

## 2016-09-27 ENCOUNTER — Encounter: Payer: Self-pay | Admitting: Cardiology

## 2016-10-01 ENCOUNTER — Other Ambulatory Visit: Payer: Self-pay | Admitting: Family Medicine

## 2016-10-03 LAB — CUP PACEART REMOTE DEVICE CHECK
Battery Impedance: 227 Ohm
Battery Remaining Longevity: 106 mo
Battery Voltage: 2.79 V
Brady Statistic AP VP Percent: 0 %
Brady Statistic AP VS Percent: 0 %
Brady Statistic AS VP Percent: 100 %
Brady Statistic AS VS Percent: 0 %
Date Time Interrogation Session: 20180518142310
Implantable Lead Implant Date: 20150120
Implantable Lead Implant Date: 20150120
Implantable Lead Location: 753859
Implantable Lead Location: 753860
Implantable Lead Model: 5076
Implantable Lead Model: 5076
Implantable Pulse Generator Implant Date: 20150120
Lead Channel Impedance Value: 414 Ohm
Lead Channel Impedance Value: 504 Ohm
Lead Channel Pacing Threshold Amplitude: 0.375 V
Lead Channel Pacing Threshold Amplitude: 0.875 V
Lead Channel Pacing Threshold Pulse Width: 0.4 ms
Lead Channel Pacing Threshold Pulse Width: 0.4 ms
Lead Channel Setting Pacing Amplitude: 2 V
Lead Channel Setting Pacing Amplitude: 2.5 V
Lead Channel Setting Pacing Pulse Width: 0.4 ms
Lead Channel Setting Sensing Sensitivity: 2.8 mV

## 2016-10-05 ENCOUNTER — Other Ambulatory Visit: Payer: Self-pay | Admitting: *Deleted

## 2016-10-05 MED ORDER — SERTRALINE HCL 50 MG PO TABS: ORAL_TABLET | ORAL | 0 refills | Status: DC

## 2016-10-11 ENCOUNTER — Encounter: Payer: Self-pay | Admitting: Cardiology

## 2016-12-22 ENCOUNTER — Encounter: Payer: Self-pay | Admitting: Family Medicine

## 2016-12-22 ENCOUNTER — Ambulatory Visit: Payer: Medicare Other | Admitting: Family Medicine

## 2016-12-22 ENCOUNTER — Ambulatory Visit (INDEPENDENT_AMBULATORY_CARE_PROVIDER_SITE_OTHER): Payer: Medicare Other | Admitting: Family Medicine

## 2016-12-22 VITALS — BP 150/80 | HR 93 | Temp 97.8°F | Wt 168.2 lb

## 2016-12-22 DIAGNOSIS — I10 Essential (primary) hypertension: Secondary | ICD-10-CM

## 2016-12-22 DIAGNOSIS — R4189 Other symptoms and signs involving cognitive functions and awareness: Secondary | ICD-10-CM

## 2016-12-22 MED ORDER — DONEPEZIL HCL 5 MG PO TABS: 5.0000 mg | ORAL_TABLET | Freq: Every day | ORAL | 5 refills | Status: DC

## 2016-12-22 MED ORDER — AMLODIPINE BESYLATE 5 MG PO TABS: 5.0000 mg | ORAL_TABLET | Freq: Every day | ORAL | 3 refills | Status: DC

## 2016-12-22 NOTE — Progress Notes (Signed)
Subjective:     Patient ID: Theresa Norton, female   DOB: [birth year], 83 y.o.   MRN: [id number]  HPI Patient seen for medical follow-up. She has history of hypertension, transient atrial fibrillation, obesity, depression. She is currently on amlodipine 2.5 mgs daily. Was taking valsartan 80 mg and after recent recall they just discontinued this. Not been monitoring her blood pressure at home. She denies any headaches or dizziness. No chest pains.  She has history of some cognitive impairment. Last year scored 29/30 on standardized Mini-Mental Status examination. We discussed follow-up at this time. Previous B12 and thyroid levels were normal.  She currently resides at assisted living and has been somewhat unhappy with her current residence.  Past Medical History:  Diagnosis Date  . Allergy   . Anemia   . Arthritis    "in my fingers" (07/08/2013)  . CAD (coronary artery disease)    a. 05/2013 nonobs dzs by cath.  . Colon polyps   . Groin hematoma    a. 05/2013 R groin hematoma post-cath - u/s 05/31/13 large 7.49 cm r inguinal hematoma extending into thigh, no psa or avf.  . H/O hiatal hernia   . Heart block AV second degree    a. s/p MDT Addapta dual chamber pacemaker 05/2013 by Dr Rayann Heman.  . Hypertension   . On home oxygen therapy    "2L maybe 12h/day" (07/08/2013)  . Pacemaker   . Pacemaker   . PAF (paroxysmal atrial fibrillation) ([place])    a. Found post-pacer 05/2013->Xarelto added;  b. 05/2013 Echo: EF 60-65%, mild LVH, nl wall motion w/o rwma.   Past Surgical History:  Procedure Laterality Date  . BREAST CYST EXCISION Right 1959; 1970's  . BUNIONECTOMY Bilateral 1967  . CARDIAC CATHETERIZATION    . CATARACT EXTRACTION W/ INTRAOCULAR LENS  IMPLANT, BILATERAL Bilateral   . DILATION AND CURETTAGE OF UTERUS    . INSERT / REPLACE / REMOVE PACEMAKER  05/2013   MDT ADDRL1 implanted by Dr Rayann Heman for heart block  . INSERT / REPLACE / REMOVE PACEMAKER  06/2013   Right ventricular lead  perforation with tamponade  . LEAD REVISION N/A 06/13/2013   Procedure: LEAD REVISION;  Surgeon: Coralyn Mark, MD;  Location: [facility];  Service: Cardiovascular;  Laterality: N/A;  . LEFT HEART CATHETERIZATION WITH CORONARY ANGIOGRAM N/A 05/27/2013   Procedure: LEFT HEART CATHETERIZATION WITH CORONARY ANGIOGRAM;  Surgeon: Birdie Riddle, MD;  Location: [facility];  Service: Cardiovascular;  Laterality: N/A;  . PERICARDIAL TAP N/A 06/11/2013   Procedure: PERICARDIAL TAP;  Surgeon: Birdie Riddle, MD;  Location: [facility];  Service: Cardiovascular;  Laterality: N/A;  . PERMANENT PACEMAKER INSERTION N/A 05/27/2013   Procedure: PERMANENT PACEMAKER INSERTION;  Surgeon: Coralyn Mark, MD;  Location: [facility];  Service: Cardiovascular;  Laterality: N/A;  . TEMPORARY PACEMAKER INSERTION N/A 05/27/2013   Procedure: TEMPORARY PACEMAKER INSERTION;  Surgeon: Birdie Riddle, MD;  Location: [facility];  Service: Cardiovascular;  Laterality: N/A;  . VARICOSE VEIN SURGERY Bilateral ~ 2002    reports that she has never smoked. She has never used smokeless tobacco. She reports that she drinks about 8.4 oz of alcohol per week . She reports that she does not use drugs. family history includes Cancer in her father. Allergies  Allergen Reactions  . Codeine     GI upset  . Morphine And Related     GI upset     Review of Systems  Constitutional: Negative for appetite change and unexpected weight change.  Eyes: Negative for visual disturbance.  Respiratory: Negative for cough, chest tightness, shortness of breath and wheezing.   Cardiovascular: Negative for chest pain, palpitations and leg swelling.  Endocrine: Negative for polydipsia and polyuria.  Neurological: Negative for dizziness, seizures, syncope, weakness, light-headedness and headaches.       Objective:   Physical Exam  Constitutional: She appears well-developed and well-nourished.  Eyes: Pupils are equal, round, and reactive to light.   Neck: Neck supple. No JVD present. No thyromegaly present.  Cardiovascular: Normal rate and regular rhythm.  Exam reveals no gallop.   Pulmonary/Chest: Effort normal and breath sounds normal. No respiratory distress. She has no wheezes. She has no rales.  Musculoskeletal: She exhibits no edema.  Neurological: She is alert.  Psychiatric:  MMSE 20/30       Assessment:     #1 hypertension suboptimally controlled with recent discontinuation of valsartan.  #2 Memory loss with question of Alzheimer's dementia. Significant drop and Mini-Mental Status exam since last year.    Plan:     -Increase amlodipine to 5 mg once daily -Start Aricept 5 mg daily at bedtime -Reassess in one month. Titrate Aricept further at that point to 10 mg if tolerating well  Eulas Post MD [facility]

## 2016-12-23 DIAGNOSIS — R4189 Other symptoms and signs involving cognitive functions and awareness: Secondary | ICD-10-CM | POA: Insufficient documentation

## 2016-12-26 ENCOUNTER — Telehealth: Payer: Self-pay | Admitting: Cardiology

## 2016-12-26 ENCOUNTER — Ambulatory Visit (INDEPENDENT_AMBULATORY_CARE_PROVIDER_SITE_OTHER): Payer: Medicare Other | Admitting: *Deleted

## 2016-12-26 DIAGNOSIS — I442 Atrioventricular block, complete: Secondary | ICD-10-CM

## 2016-12-26 NOTE — Telephone Encounter (Signed)
Confirmed remote transmission w/ pt son.    

## 2016-12-28 ENCOUNTER — Encounter: Payer: Self-pay | Admitting: Cardiology

## 2016-12-29 ENCOUNTER — Ambulatory Visit: Payer: Medicare Other | Admitting: Family Medicine

## 2017-01-05 ENCOUNTER — Encounter: Payer: Self-pay | Admitting: Cardiology

## 2017-01-05 NOTE — Progress Notes (Signed)
Remote pacemaker transmission.   

## 2017-01-08 NOTE — Progress Notes (Signed)
Cardiology Office Note Date:  01/10/2017  Patient ID:  Theresa Norton, DOB 16-Sep-1928, MRN [id number] PCP:  Eulas Post, MD  Electrophysiologist: Dr. Rayann Heman    Chief Complaint: routine 6 mo visit  History of Present Illness: Theresa Norton is a 83 y.o. female with history of CHB w/PPM, HTN, AFib in noted on her problem list, I do not see this a working diagnosis for her, nonobstructive CAD by cath in 2015, chronic edema.  She comes in today to be seen for dr. Rayann Heman.  She last saw him in Feb 2018, she had stopped her lasic and treating her edema w/tumeric that she felt had helped some, she was advised sodium reduction and supposrt stockings, BP was elevated though patient declined medication adjustments, planned for Q 6 month visits with APP going forward.  She comes accompanied by her son.  She is doing well, sinuses bother her.  Her edema has been resolved and doing well for a while now.  No SOB, CP, palpitations, no near syncope or syncope.  Device information: MDT dual chamber PPM implanted 05/27/13, Dr. Rayann Heman, complicated by lead perforation/effusion and required lead repositioning DEPENDENT   Past Medical History:  Diagnosis Date  . Allergy   . Anemia   . Arthritis    "in my fingers" (07/08/2013)  . CAD (coronary artery disease)    a. 05/2013 nonobs dzs by cath.  . Colon polyps   . Groin hematoma    a. 05/2013 R groin hematoma post-cath - u/s 05/31/13 large 7.49 cm r inguinal hematoma extending into thigh, no psa or avf.  . H/O hiatal hernia   . Heart block AV second degree    a. s/p MDT Addapta dual chamber pacemaker 05/2013 by Dr Rayann Heman.  . Hypertension   . On home oxygen therapy    "2L maybe 12h/day" (07/08/2013)  . Pacemaker   . Pacemaker   . PAF (paroxysmal atrial fibrillation) ([place])    a. Found post-pacer 05/2013->Xarelto added;  b. 05/2013 Echo: EF 60-65%, mild LVH, nl wall motion w/o rwma.    Past Surgical History:  Procedure Laterality Date  . BREAST  CYST EXCISION Right 1959; 1970's  . BUNIONECTOMY Bilateral 1967  . CARDIAC CATHETERIZATION    . CATARACT EXTRACTION W/ INTRAOCULAR LENS  IMPLANT, BILATERAL Bilateral   . DILATION AND CURETTAGE OF UTERUS    . INSERT / REPLACE / REMOVE PACEMAKER  05/2013   MDT ADDRL1 implanted by Dr Rayann Heman for heart block  . INSERT / REPLACE / REMOVE PACEMAKER  06/2013   Right ventricular lead perforation with tamponade  . LEAD REVISION N/A 06/13/2013   Procedure: LEAD REVISION;  Surgeon: Coralyn Mark, MD;  Location: [facility];  Service: Cardiovascular;  Laterality: N/A;  . LEFT HEART CATHETERIZATION WITH CORONARY ANGIOGRAM N/A 05/27/2013   Procedure: LEFT HEART CATHETERIZATION WITH CORONARY ANGIOGRAM;  Surgeon: Birdie Riddle, MD;  Location: [facility];  Service: Cardiovascular;  Laterality: N/A;  . PERICARDIAL TAP N/A 06/11/2013   Procedure: PERICARDIAL TAP;  Surgeon: Birdie Riddle, MD;  Location: [facility];  Service: Cardiovascular;  Laterality: N/A;  . PERMANENT PACEMAKER INSERTION N/A 05/27/2013   Procedure: PERMANENT PACEMAKER INSERTION;  Surgeon: Coralyn Mark, MD;  Location: [facility];  Service: Cardiovascular;  Laterality: N/A;  . TEMPORARY PACEMAKER INSERTION N/A 05/27/2013   Procedure: TEMPORARY PACEMAKER INSERTION;  Surgeon: Birdie Riddle, MD;  Location: [facility];  Service: Cardiovascular;  Laterality: N/A;  . VARICOSE  VEIN SURGERY Bilateral ~ 2002    Current Outpatient Prescriptions  Medication Sig Dispense Refill  . acetaminophen (TYLENOL) 325 MG tablet Take 325 mg by mouth every 6 (six) hours as needed for mild pain.    Marland Kitchen amLODipine (NORVASC) 5 MG tablet Take 1 tablet (5 mg total) by mouth daily. 30 tablet 3  . beta carotene w/minerals (OCUVITE) tablet Take 1 tablet by mouth daily.    Marland Kitchen dextromethorphan-guaiFENesin (MUCINEX DM) 30-600 MG 12hr tablet Take 1 tablet by mouth 2 (two) times daily as needed for cough.    . donepezil (ARICEPT) 5 MG tablet Take 1 tablet (5 mg total) by mouth at  bedtime. 30 tablet 5  . loratadine (CLARITIN) 10 MG tablet Take 10 mg by mouth daily as needed for allergies.     Marland Kitchen sertraline (ZOLOFT) 50 MG tablet TAKE 1 TABLET(50 MG) BY MOUTH DAILY 90 tablet 0  . TURMERIC PO Take 1 capsule by mouth daily.      No current facility-administered medications for this visit.     Allergies:   Codeine and Morphine and related   Social History:  The patient  reports that she has never smoked. She has never used smokeless tobacco. She reports that she drinks about 8.4 oz of alcohol per week . She reports that she does not use drugs.   Family History:  The patient's family history includes Cancer in her father.  ROS:  Please see the history of present illness.  All other systems are reviewed and otherwise negative.   PHYSICAL EXAM:  VS:  BP (!) 144/80   Pulse 79   Ht 5\' 3"  (1.6 m)   Wt 167 lb (75.8 kg)   BMI 29.58 kg/m  BMI: Body mass index is 29.58 kg/m. Well nourished, well developed, in no acute distress  HEENT: normocephalic, atraumatic  Neck: no JVD, carotid bruits or masses Cardiac:  RRR; no significant murmurs, no rubs, or gallops Lungs:  CTA b/l, no wheezing, rhonchi or rales  Abd: soft, nontender MS: no deformity, age appropriate atrophy Ext: no edema  Skin: warm and dry, no rash Neuro:  No gross deficits appreciated Psych: euthymic mood, full affect  PPM site is stable, no tethering or discomfort   EKG:  Done today and reviewed by myself shows SR, V pacing PPM interrogation done today by industry and reviewed by myself: battery and lead measurements are good, no device observations, she is device dependent  03/05/15: TTE Study Conclusions - Left ventricle: The cavity size was normal. Wall thickness was   increased in a pattern of mild LVH. Systolic function was   vigorous. The estimated ejection fraction was in the range of 65%   to 70%. Wall motion was normal; there were no regional wall   motion abnormalities. Doppler parameters  are consistent with   abnormal left ventricular relaxation (grade 1 diastolic   dysfunction). Doppler parameters are consistent with high   ventricular filling pressure. - Ascending aorta: The ascending aorta was mildly dilated. - Mitral valve: Calcified annulus. There was mild systolic anterior   motion of the chordal structures. There was mild regurgitation. Impressions: - Vigorous LV function; mild LVH; grade 1 diastolic dysfunction   with elevated LV filling pressure; chordal SAM with peak LVOT   velocity of 2 m/s; mild MR.  Recent Labs: 02/23/2016: ALT 17 03/22/2016: BUN 23; Creatinine, Ser 1.19; Potassium 5.1; Sodium 134  No results found for requested labs within last 8760 hours.   CrCl cannot be  calculated (Patient's most recent lab result is older than the maximum 21 days allowed.).   Wt Readings from Last 3 Encounters:  01/10/17 167 lb (75.8 kg)  12/22/16 168 lb 3.2 oz (76.3 kg)  08/28/16 172 lb 8 oz (78.2 kg)     Other studies reviewed: Additional studies/records reviewed today include: summarized above  ASSESSMENT AND PLAN:  1. PPM     Intact function  2. HTN     Looks OK, no changes  3. Chronic edema     None appreciated today  4. ?Hx of AF     No AF on her device check     Uncertain when or what circumstances this was noted     Follow via her device   Disposition: F/u with Q 3 month remotes, clinic visit in 6 months, sooner if needed.  Current medicines are reviewed at length with the patient today.  The patient did not have any concerns regarding medicines.  Haywood Lasso, PA-C 01/10/2017 1:54 PM     [address] [place] [address] [phone] (office)  [phone] (fax)

## 2017-01-10 ENCOUNTER — Ambulatory Visit (INDEPENDENT_AMBULATORY_CARE_PROVIDER_SITE_OTHER): Payer: Medicare Other | Admitting: Physician Assistant

## 2017-01-10 VITALS — BP 144/80 | HR 79 | Ht 63.0 in | Wt 167.0 lb

## 2017-01-10 DIAGNOSIS — Z950 Presence of cardiac pacemaker: Secondary | ICD-10-CM | POA: Diagnosis not present

## 2017-01-10 DIAGNOSIS — I10 Essential (primary) hypertension: Secondary | ICD-10-CM | POA: Diagnosis not present

## 2017-01-10 NOTE — Patient Instructions (Addendum)
Medication Instructions:   Your physician recommends that you continue on your current medications as directed. Please refer to the Current Medication list given to you today.    If you need a refill on your cardiac medications before your next appointment, please call your pharmacy.  Labwork: NONE ORDERED  TODAY    Testing/Procedures: NONE ORDERED  TODAY    Follow-Up: Your physician wants you to follow-up in:  IN 6 MONTHS WITH DR Rayann Heman  You will receive a reminder letter in the mail two months in advance. If you don't receive a letter, please call our office to schedule the follow-up appointment.   Remote monitoring is used to monitor your Pacemaker of ICD from home. This monitoring reduces the number of office visits required to check your device to one time per year. It allows [place] to keep an eye on the functioning of your device to ensure it is working properly. You are scheduled for a device check from home on . 04-09-17 You may send your transmission at any time that day. If you have a wireless device, the transmission will be sent automatically. After your physician reviews your transmission, you will receive a postcard with your next transmission date.     Any Other Special Instructions Will Be Listed Below (If Applicable).

## 2017-01-19 ENCOUNTER — Ambulatory Visit: Payer: Medicare Other | Admitting: Family Medicine

## 2017-01-19 ENCOUNTER — Encounter: Payer: Self-pay | Admitting: Cardiology

## 2017-01-22 NOTE — Progress Notes (Addendum)
Subjective:   Theresa Norton is a 83 y.o. female who presents for Medicare Annual (Subsequent) preventive examination.  The Patient was informed that the wellness visit is to identify future health risk and educate and initiate measures that can reduce risk for increased disease through the lifespan.    Annual Wellness Assessment  Theresa Norton's son accompanies her today  Sates she lives in [place] room at [facility] been there since Jan  Or Feb Her meals are provided Has 2 sons and a dtr; all who check on her  Has been there is Jan of this year  Dtr will come and help her shower   Tobacco is neg ETOH; admits to drinking which is "her normal" Son stated she has drank 1.5 bottles of wine daily since Saturday.  States he brought 4 bottles on Sat and Tuesday and there is nothing left   When Theresa Norton was asked about this, states again she has always drank wine and does not get inebriated.  Was not clear if she drain all the wine or if she had company  Discussed stopping x 30 days and then fup with Dr. Elease Hashimoto so he can check her labs and evaluate for memory loss and depression.  States she does not know if she can go 30days and did agree to stopping her ETOH intake x 2 weeks;  Also discussed her mood; Admits she is bored. Does not appear to be engaging  In social functions although admits she was very social. Admits she is not adjusting well;  Educated regarding the impact of ETOH consumption on her mood. Introduced Radio broadcast assistant; agreed she would consider going as she is not "her old self".   MMSE was 28/30; thought the day was Monday instead of Tuesday; thought the date was 16 instead of the 18th. Failed clock test.    Preventive Screening -Counseling & Management  Medicare Annual Preventive Care Visit - Subsequent Last OV  12/2015 - started aricept due to MMSE Son states they remind her to take her meds but she is not taking the evening med. States  she does not feel she needs this med and sometimes she does not want to take it  Health Maintenance Due  Topic Date Due  . DEXA SCAN  09/06/1995  . PNA vac Low Risk Adult (1 of 2 - PCV13) 09/06/1995  . TETANUS/TDAP  04/11/2007   Dexa; had one fall; break you wrist / will hold dexa for now States she is not sure if she had a pneumonia vaccine. Declines to take it. Declines flu  Did not discuss Tetanus at this time due to discussion of ETOH and mental status   Hearing Screening Comments: States her hearing is good  Vision Screening Comments: Vision is good Forgot when she had check up    VS reviewed;   Diet  Eats at the facility   BMI 33  Exercise limited   Patient Care Team: Eulas Post, MD as PCP - General (Family Medicine)   Cardiac Risk Factors include: advanced age (>67men, >40 women);obesity (BMI >30kg/m2);sedentary lifestyle     Objective:     Vitals: BP 124/80   Pulse 85   Ht 5' (1.524 m)   Wt 169 lb (76.7 kg)   SpO2 96%   BMI 33.01 kg/m   Body mass index is 33.01 kg/m.   Tobacco History  Smoking Status  . Never Smoker  Smokeless Tobacco  . Never Used  Counseling given: Yes   Past Medical History:  Diagnosis Date  . Allergy   . Anemia   . Arthritis    "in my fingers" (07/08/2013)  . CAD (coronary artery disease)    a. 05/2013 nonobs dzs by cath.  . Colon polyps   . Groin hematoma    a. 05/2013 R groin hematoma post-cath - u/s 05/31/13 large 7.49 cm r inguinal hematoma extending into thigh, no psa or avf.  . H/O hiatal hernia   . Heart block AV second degree    a. s/p MDT Addapta dual chamber pacemaker 05/2013 by Dr Rayann Heman.  . Hypertension   . On home oxygen therapy    "2L maybe 12h/day" (07/08/2013)  . Pacemaker   . Pacemaker   . PAF (paroxysmal atrial fibrillation) ([place])    a. Found post-pacer 05/2013->Xarelto added;  b. 05/2013 Echo: EF 60-65%, mild LVH, nl wall motion w/o rwma.   Past Surgical History:  Procedure Laterality  Date  . BREAST CYST EXCISION Right 1959; 1970's  . BUNIONECTOMY Bilateral 1967  . CARDIAC CATHETERIZATION    . CATARACT EXTRACTION W/ INTRAOCULAR LENS  IMPLANT, BILATERAL Bilateral   . DILATION AND CURETTAGE OF UTERUS    . INSERT / REPLACE / REMOVE PACEMAKER  05/2013   MDT [id number] implanted by Dr Rayann Heman for heart block  . INSERT / REPLACE / REMOVE PACEMAKER  06/2013   Right ventricular lead perforation with tamponade  . LEAD REVISION N/A 06/13/2013   Procedure: LEAD REVISION;  Surgeon: Coralyn Mark, MD;  Location: [facility];  Service: Cardiovascular;  Laterality: N/A;  . LEFT HEART CATHETERIZATION WITH CORONARY ANGIOGRAM N/A 05/27/2013   Procedure: LEFT HEART CATHETERIZATION WITH CORONARY ANGIOGRAM;  Surgeon: Birdie Riddle, MD;  Location: [facility];  Service: Cardiovascular;  Laterality: N/A;  . PERICARDIAL TAP N/A 06/11/2013   Procedure: PERICARDIAL TAP;  Surgeon: Birdie Riddle, MD;  Location: [facility];  Service: Cardiovascular;  Laterality: N/A;  . PERMANENT PACEMAKER INSERTION N/A 05/27/2013   Procedure: PERMANENT PACEMAKER INSERTION;  Surgeon: Coralyn Mark, MD;  Location: [facility];  Service: Cardiovascular;  Laterality: N/A;  . TEMPORARY PACEMAKER INSERTION N/A 05/27/2013   Procedure: TEMPORARY PACEMAKER INSERTION;  Surgeon: Birdie Riddle, MD;  Location: [facility];  Service: Cardiovascular;  Laterality: N/A;  . VARICOSE VEIN SURGERY Bilateral ~ 2002   Family History  Problem Relation Age of Onset  . Cancer Father        lung   History  Sexual Activity  . Sexual activity: No    Outpatient Encounter Prescriptions as of 01/23/2017  Medication Sig  . acetaminophen (TYLENOL) 325 MG tablet Take 325 mg by mouth every 6 (six) hours as needed for mild pain.  Marland Kitchen amLODipine (NORVASC) 5 MG tablet Take 1 tablet (5 mg total) by mouth daily.  . beta carotene w/minerals (OCUVITE) tablet Take 1 tablet by mouth daily.  Marland Kitchen dextromethorphan-guaiFENesin (MUCINEX DM) 30-600 MG 12hr tablet  Take 1 tablet by mouth 2 (two) times daily as needed for cough.  . donepezil (ARICEPT) 5 MG tablet Take 1 tablet (5 mg total) by mouth at bedtime.  Marland Kitchen loratadine (CLARITIN) 10 MG tablet Take 10 mg by mouth daily as needed for allergies.   Marland Kitchen sertraline (ZOLOFT) 50 MG tablet TAKE 1 TABLET(50 MG) BY MOUTH DAILY  . TURMERIC PO Take 1 capsule by mouth daily.    No facility-administered encounter medications on file as of 01/23/2017.  Activities of Daily Living In your present state of health, do you have any difficulty performing the following activities: 01/23/2017  Hearing? N  Vision? N  Walking or climbing stairs? N  Comment elevators  Dressing or bathing? N  Doing errands, shopping? N  Preparing Food and eating ? N  Using the Toilet? N  In the past six months, have you accidently leaked urine? N  Do you have problems with loss of bowel control? N  Managing your Medications? Y  Managing your Finances? N  Housekeeping or managing your Housekeeping? N  Some recent data might be hidden    Patient Care Team: Eulas Post, MD as PCP - General (Family Medicine)    Assessment:     Exercise Activities and Dietary recommendations No exercise at present. Does not participate in programs at the [employer]    . patient          Join and participate on one activity      Fall Risk Fall Risk  01/23/2017 04/13/2016 04/22/2015 04/16/2014 04/16/2014  Falls in the past year? Yes No No Yes No  Comment - Emmi Telephone Survey: data to providers prior to load - - -  Number falls in past yr: - - - 1 -  Injury with Fall? Yes - - Yes -  Risk for fall due to : Impaired mobility - - - -   Depression Screen PHQ 2/9 Scores 01/23/2017 04/22/2015 04/16/2014 02/20/2013  PHQ - 2 Score 0 0 0 0         MMSE - Mini Mental State Exam 01/23/2017  Orientation to time 3  Orientation to Place 5  Registration 3  Attention/ Calculation 5  Recall 3  Language- name 2 objects 2    Language- repeat 1  Language- follow 3 step command 3  Language- read & follow direction 1  Write a sentence 1  Copy design 1  Total score 28        Immunization History  Administered Date(s) Administered  . Td 04/10/1997   Screening Tests Health Maintenance  Topic Date Due  . DEXA SCAN  09/06/1995  . PNA vac Low Risk Adult (1 of 2 - PCV13) 09/06/1995  . TETANUS/TDAP  04/11/2007  . INFLUENZA VACCINE  07/22/2019 (Originally 12/06/2016)      Plan:     PCP Notes   Health Maintenance Declines pneumonia vaccines and flu Deferred Tdap discussion due to time spent on EToH Deferred dexa due to mental status; had fx wrist Oct of 2017 after fall;   Abnormal Screens  MMSE 28/30 - missed day of the week by one day and missed date by 2 days.  Failed clock test. Not engaging at [place] is subdued; makes comments that she is fine and 88 and has no fear of dying. Son states he brought 4 bottles of wine to her on Saturday, 3 days ago and  none are left. She states she has drank culturally for a long time. Did not admit or deny, stating sometimes she has company and serves wine.  Requested she consider 4 weeks off ETOH so Dr. Elease Hashimoto can evaluate her issues; including memory issues or depression as well as draw labs. Agreed to 2 weeks and then admits to being "bored". Discussed Behavioral health counseling to assist her with managing her grief. Agreed she would consider.  Admits to being very sad at her state of affairs and is not happy.   Will  make an apt with Dr. Elease Hashimoto for blood work and evaluation in 2 weeks.   Referrals  None today  Patient concerns; States she is having some dysuria. Tried to obtain a urine specimen today but could not. Agreed to bring a specimen by the office if she continues to have pain with voiding.   Nurse Concerns; As noted   Next PCP apt To be scheduled in 2 weeks     I have personally reviewed and noted the following in the  patient's chart:   . Medical and social history . Use of alcohol, tobacco or illicit drugs  . Current medications and supplements . Functional ability and status . Nutritional status . Physical activity . Advanced directives . List of other physicians . Hospitalizations, surgeries, and ER visits in previous 12 months . Vitals . Screenings to include cognitive, depression, and falls . Referrals and appointments  In addition, I have reviewed and discussed with patient certain preventive protocols, quality metrics, and best practice recommendations. A written personalized care plan for preventive services as well as general preventive health recommendations were provided to patient.     WCBJS,EGBTD, RN  01/23/2017  Above noted.   Consider labs- B12, BMP, ?TSH at follow up.  Agree with assessment as above.  She has always downplayed her drinking (ETOH) and difficult to get accurate assessment of that.  Eulas Post MD [facility]

## 2017-01-23 ENCOUNTER — Ambulatory Visit (INDEPENDENT_AMBULATORY_CARE_PROVIDER_SITE_OTHER): Payer: Medicare Other

## 2017-01-23 VITALS — BP 124/80 | HR 85 | Ht 60.0 in | Wt 169.0 lb

## 2017-01-23 DIAGNOSIS — R300 Dysuria: Secondary | ICD-10-CM

## 2017-01-23 DIAGNOSIS — Z0000 Encounter for general adult medical examination without abnormal findings: Secondary | ICD-10-CM | POA: Diagnosis not present

## 2017-01-23 NOTE — Patient Instructions (Addendum)
Theresa Norton , Thank you for taking time to come for your Medicare Wellness Visit. I appreciate your ongoing commitment to your health goals. Please review the following plan we discussed and let me know if I can assist you in the future.   Please stop drinking wine if possible and make an apt with Dr. Elease Hashimoto (2 weeks)  This will help [place] diagnosis and treat any issues you are having. The intake of alcohol is making it difficult to diagnose if you have memory issues or may cause you to be depressed.   These are the goals we discussed: Goals    . patient          Join and participate on one activity       This is a list of the screening recommended for you and due dates:  Health Maintenance  Topic Date Due  . DEXA scan (bone density measurement)  09/06/1995  . Pneumonia vaccines (1 of 2 - PCV13) 09/06/1995  . Tetanus Vaccine  04/11/2007  . Flu Shot  07/22/2019*  *Topic was postponed. The date shown is not the original due date.     Prevention of falls: Remove rugs or any tripping hazards in the home Use Non slip mats in bathtubs and showers Placing grab bars next to the toilet and or shower Placing handrails on both sides of the stair way Adding extra lighting in the home.   Personal safety issues reviewed:  1. Consider starting a community watch program per [facility] 2.  Changes batteries is smoke detector and/or carbon monoxide detector  3.  If you have firearms; keep them in a safe place 4.  Wear protection when in the sun; Always wear sunscreen or a hat; It is good to have your doctor check your skin annually or review any new areas of concern 5. Driving safety; Keep in the right lane; stay 3 car lengths behind the car in front of you on the highway; look 3 times prior to pulling out; carry your cell phone everywhere you go!    Learn about the Yellow Dot program:  The program allows first responders at your emergency to have access to who your physician is,  as well as your medications and medical conditions.  Citizens requesting the Yellow Dot Packages should contact Master Corporal Nunzio Cobbs at the [facility] [phone] for the first week of the program and beginning the week after Easter citizens should contact their Scientist, physiological.        Fall Prevention in the Home Falls can cause injuries. They can happen to people of all ages. There are many things you can do to make your home safe and to help prevent falls. What can I do on the outside of my home?  Regularly fix the edges of walkways and driveways and fix any cracks.  Remove anything that might make you trip as you walk through a door, such as a raised step or threshold.  Trim any bushes or trees on the path to your home.  Use bright outdoor lighting.  Clear any walking paths of anything that might make someone trip, such as rocks or tools.  Regularly check to see if handrails are loose or broken. Make sure that both sides of any steps have handrails.  Any raised decks and porches should have guardrails on the edges.  Have any leaves, snow, or ice cleared regularly.  Use sand or salt on walking paths during winter.  Clean up any spills in your garage right away. This includes oil or grease spills. What can I do in the bathroom?  Use night lights.  Install grab bars by the toilet and in the tub and shower. Do not use towel bars as grab bars.  Use non-skid mats or decals in the tub or shower.  If you need to sit down in the shower, use a plastic, non-slip stool.  Keep the floor dry. Clean up any water that spills on the floor as soon as it happens.  Remove soap buildup in the tub or shower regularly.  Attach bath mats securely with double-sided non-slip rug tape.  Do not have throw rugs and other things on the floor that can make you trip. What can I do in the bedroom?  Use night lights.  Make sure that you have a light  by your bed that is easy to reach.  Do not use any sheets or blankets that are too big for your bed. They should not hang down onto the floor.  Have a firm chair that has side arms. You can use this for support while you get dressed.  Do not have throw rugs and other things on the floor that can make you trip. What can I do in the kitchen?  Clean up any spills right away.  Avoid walking on wet floors.  Keep items that you use a lot in easy-to-reach places.  If you need to reach something above you, use a strong step stool that has a grab bar.  Keep electrical cords out of the way.  Do not use floor polish or wax that makes floors slippery. If you must use wax, use non-skid floor wax.  Do not have throw rugs and other things on the floor that can make you trip. What can I do with my stairs?  Do not leave any items on the stairs.  Make sure that there are handrails on both sides of the stairs and use them. Fix handrails that are broken or loose. Make sure that handrails are as long as the stairways.  Check any carpeting to make sure that it is firmly attached to the stairs. Fix any carpet that is loose or worn.  Avoid having throw rugs at the top or bottom of the stairs. If you do have throw rugs, attach them to the floor with carpet tape.  Make sure that you have a light switch at the top of the stairs and the bottom of the stairs. If you do not have them, ask someone to add them for you. What else can I do to help prevent falls?  Wear shoes that: ? Do not have high heels. ? Have rubber bottoms. ? Are comfortable and fit you well. ? Are closed at the toe. Do not wear sandals.  If you use a stepladder: ? Make sure that it is fully opened. Do not climb a closed stepladder. ? Make sure that both sides of the stepladder are locked into place. ? Ask someone to hold it for you, if possible.  Clearly mark and make sure that you can see: ? Any grab bars or handrails. ? First  and last steps. ? Where the edge of each step is.  Use tools that help you move around (mobility aids) if they are needed. These include: ? Canes. ? Walkers. ? Scooters. ? Crutches.  Turn on the lights when you go into a dark area. Replace any light bulbs as soon  as they burn out.  Set up your furniture so you have a clear path. Avoid moving your furniture around.  If any of your floors are uneven, fix them.  If there are any pets around you, be aware of where they are.  Review your medicines with your doctor. Some medicines can make you feel dizzy. This can increase your chance of falling. Ask your doctor what other things that you can do to help prevent falls. This information is not intended to replace advice given to you by your health care provider. Make sure you discuss any questions you have with your health care provider. Document Released: 02/18/2009 Document Revised: 09/30/2015 Document Reviewed: 05/29/2014 Elsevier Interactive Patient Education  2018 [place] Maintenance, Female Adopting a healthy lifestyle and getting preventive care can go a long way to promote health and wellness. Talk with your health care provider about what schedule of regular examinations is right for you. This is a good chance for you to check in with your provider about disease prevention and staying healthy. In between checkups, there are plenty of things you can do on your own. Experts have done a lot of research about which lifestyle changes and preventive measures are most likely to keep you healthy. Ask your health care provider for more information. Weight and diet Eat a healthy diet  Be sure to include plenty of vegetables, fruits, low-fat dairy products, and lean protein.  Do not eat a lot of foods high in solid fats, added sugars, or salt.  Get regular exercise. This is one of the most important things you can do for your health. ? Most adults should exercise for at least  150 minutes each week. The exercise should increase your heart rate and make you sweat (moderate-intensity exercise). ? Most adults should also do strengthening exercises at least twice a week. This is in addition to the moderate-intensity exercise.  Maintain a healthy weight  Body mass index (BMI) is a measurement that can be used to identify possible weight problems. It estimates body fat based on height and weight. Your health care provider can help determine your BMI and help you achieve or maintain a healthy weight.  For females 37 years of age and older: ? A BMI below 18.5 is considered underweight. ? A BMI of 18.5 to 24.9 is normal. ? A BMI of 25 to 29.9 is considered overweight. ? A BMI of 30 and above is considered obese.  Watch levels of cholesterol and blood lipids  You should start having your blood tested for lipids and cholesterol at 81 years of age, then have this test every 5 years.  You may need to have your cholesterol levels checked more often if: ? Your lipid or cholesterol levels are high. ? You are older than 81 years of age. ? You are at high risk for heart disease.  Cancer screening Lung Cancer  Lung cancer screening is recommended for adults 37-38 years old who are at high risk for lung cancer because of a history of smoking.  A yearly low-dose CT scan of the lungs is recommended for people who: ? Currently smoke. ? Have quit within the past 15 years. ? Have at least a 30-pack-year history of smoking. A pack year is smoking an average of one pack of cigarettes a day for 1 year.  Yearly screening should continue until it has been 15 years since you quit.  Yearly screening should stop if you develop a health problem  that would prevent you from having lung cancer treatment.  Breast Cancer  Practice breast self-awareness. This means understanding how your breasts normally appear and feel.  It also means doing regular breast self-exams. Let your health care  provider know about any changes, no matter how small.  If you are in your 20s or 30s, you should have a clinical breast exam (CBE) by a health care provider every 1-3 years as part of a regular health exam.  If you are 41 or older, have a CBE every year. Also consider having a breast X-ray (mammogram) every year.  If you have a family history of breast cancer, talk to your health care provider about genetic screening.  If you are at high risk for breast cancer, talk to your health care provider about having an MRI and a mammogram every year.  Breast cancer gene (BRCA) assessment is recommended for women who have family members with BRCA-related cancers. BRCA-related cancers include: ? Breast. ? Ovarian. ? Tubal. ? Peritoneal cancers.  Results of the assessment will determine the need for genetic counseling and BRCA1 and BRCA2 testing.  Cervical Cancer Your health care provider may recommend that you be screened regularly for cancer of the pelvic organs (ovaries, uterus, and vagina). This screening involves a pelvic examination, including checking for microscopic changes to the surface of your cervix (Pap test). You may be encouraged to have this screening done every 3 years, beginning at age 57.  For women ages 55-65, health care providers may recommend pelvic exams and Pap testing every 3 years, or they may recommend the Pap and pelvic exam, combined with testing for human papilloma virus (HPV), every 5 years. Some types of HPV increase your risk of cervical cancer. Testing for HPV may also be done on women of any age with unclear Pap test results.  Other health care providers may not recommend any screening for nonpregnant women who are considered low risk for pelvic cancer and who do not have symptoms. Ask your health care provider if a screening pelvic exam is right for you.  If you have had past treatment for cervical cancer or a condition that could lead to cancer, you need Pap tests  and screening for cancer for at least 20 years after your treatment. If Pap tests have been discontinued, your risk factors (such as having a new sexual partner) need to be reassessed to determine if screening should resume. Some women have medical problems that increase the chance of getting cervical cancer. In these cases, your health care provider may recommend more frequent screening and Pap tests.  Colorectal Cancer  This type of cancer can be detected and often prevented.  Routine colorectal cancer screening usually begins at 81 years of age and continues through 81 years of age.  Your health care provider may recommend screening at an earlier age if you have risk factors for colon cancer.  Your health care provider may also recommend using home test kits to check for hidden blood in the stool.  A small camera at the end of a tube can be used to examine your colon directly (sigmoidoscopy or colonoscopy). This is done to check for the earliest forms of colorectal cancer.  Routine screening usually begins at age 77.  Direct examination of the colon should be repeated every 5-10 years through 81 years of age. However, you may need to be screened more often if early forms of precancerous polyps or small growths are found.  Skin Cancer  Check your skin from head to toe regularly.  Tell your health care provider about any new moles or changes in moles, especially if there is a change in a mole's shape or color.  Also tell your health care provider if you have a mole that is larger than the size of a pencil eraser.  Always use sunscreen. Apply sunscreen liberally and repeatedly throughout the day.  Protect yourself by wearing long sleeves, pants, a wide-brimmed hat, and sunglasses whenever you are outside.  Heart disease, diabetes, and high blood pressure  High blood pressure causes heart disease and increases the risk of stroke. High blood pressure is more likely to develop  in: ? People who have blood pressure in the high end of the normal range (130-139/85-89 mm Hg). ? People who are overweight or obese. ? People who are African American.  If you are 50-104 years of age, have your blood pressure checked every 3-5 years. If you are 44 years of age or older, have your blood pressure checked every year. You should have your blood pressure measured twice-once when you are at a hospital or clinic, and once when you are not at a hospital or clinic. Record the average of the two measurements. To check your blood pressure when you are not at a hospital or clinic, you can use: ? An automated blood pressure machine at a pharmacy. ? A home blood pressure monitor.  If you are between 2 years and 21 years old, ask your health care provider if you should take aspirin to prevent strokes.  Have regular diabetes screenings. This involves taking a blood sample to check your fasting blood sugar level. ? If you are at a normal weight and have a low risk for diabetes, have this test once every three years after 81 years of age. ? If you are overweight and have a high risk for diabetes, consider being tested at a younger age or more often. Preventing infection Hepatitis B  If you have a higher risk for hepatitis B, you should be screened for this virus. You are considered at high risk for hepatitis B if: ? You were born in a country where hepatitis B is common. Ask your health care provider which countries are considered high risk. ? Your parents were born in a high-risk country, and you have not been immunized against hepatitis B (hepatitis B vaccine). ? You have HIV or AIDS. ? You use needles to inject street drugs. ? You live with someone who has hepatitis B. ? You have had sex with someone who has hepatitis B. ? You get hemodialysis treatment. ? You take certain medicines for conditions, including cancer, organ transplantation, and autoimmune conditions.  Hepatitis C  Blood  testing is recommended for: ? Everyone born from [birth year] through 1963. ? Anyone with known risk factors for hepatitis C.  Sexually transmitted infections (STIs)  You should be screened for sexually transmitted infections (STIs) including gonorrhea and chlamydia if: ? You are sexually active and are younger than 81 years of age. ? You are older than 81 years of age and your health care provider tells you that you are at risk for this type of infection. ? Your sexual activity has changed since you were last screened and you are at an increased risk for chlamydia or gonorrhea. Ask your health care provider if you are at risk.  If you do not have HIV, but are at risk, it may be recommended that you take a prescription  medicine daily to prevent HIV infection. This is called pre-exposure prophylaxis (PrEP). You are considered at risk if: ? You are sexually active and do not regularly use condoms or know the HIV status of your partner(s). ? You take drugs by injection. ? You are sexually active with a partner who has HIV.  Talk with your health care provider about whether you are at high risk of being infected with HIV. If you choose to begin PrEP, you should first be tested for HIV. You should then be tested every 3 months for as long as you are taking PrEP. Pregnancy  If you are premenopausal and you may become pregnant, ask your health care provider about preconception counseling.  If you may become pregnant, take 400 to 800 micrograms (mcg) of folic acid every day.  If you want to prevent pregnancy, talk to your health care provider about birth control (contraception). Osteoporosis and menopause  Osteoporosis is a disease in which the bones lose minerals and strength with aging. This can result in serious bone fractures. Your risk for osteoporosis can be identified using a bone density scan.  If you are 18 years of age or older, or if you are at risk for osteoporosis and fractures, ask your  health care provider if you should be screened.  Ask your health care provider whether you should take a calcium or vitamin D supplement to lower your risk for osteoporosis.  Menopause may have certain physical symptoms and risks.  Hormone replacement therapy may reduce some of these symptoms and risks. Talk to your health care provider about whether hormone replacement therapy is right for you. Follow these instructions at home:  Schedule regular health, dental, and eye exams.  Stay current with your immunizations.  Do not use any tobacco products including cigarettes, chewing tobacco, or electronic cigarettes.  If you are pregnant, do not drink alcohol.  If you are breastfeeding, limit how much and how often you drink alcohol.  Limit alcohol intake to no more than 1 drink per day for nonpregnant women. One drink equals 12 ounces of beer, 5 ounces of wine, or 1 ounces of hard liquor.  Do not use street drugs.  Do not share needles.  Ask your health care provider for help if you need support or information about quitting drugs.  Tell your health care provider if you often feel depressed.  Tell your health care provider if you have ever been abused or do not feel safe at home. This information is not intended to replace advice given to you by your health care provider. Make sure you discuss any questions you have with your health care provider. Document Released: 11/07/2010 Document Revised: 09/30/2015 Document Reviewed: 01/26/2015 Elsevier Interactive Patient Education  [employer].

## 2017-01-25 ENCOUNTER — Encounter: Payer: Self-pay | Admitting: Family Medicine

## 2017-02-13 LAB — CUP PACEART REMOTE DEVICE CHECK
Battery Impedance: 251 Ohm
Battery Remaining Longevity: 103 mo
Battery Voltage: 2.79 V
Brady Statistic AP VP Percent: 0 %
Brady Statistic AP VS Percent: 0 %
Brady Statistic AS VP Percent: 100 %
Brady Statistic AS VS Percent: 0 %
Date Time Interrogation Session: 20180823170656
Implantable Lead Implant Date: 20150120
Implantable Lead Implant Date: 20150120
Implantable Lead Location: 753859
Implantable Lead Location: 753860
Implantable Lead Model: 5076
Implantable Lead Model: 5076
Implantable Pulse Generator Implant Date: 20150120
Lead Channel Impedance Value: 443 Ohm
Lead Channel Impedance Value: 502 Ohm
Lead Channel Pacing Threshold Amplitude: 0.375 V
Lead Channel Pacing Threshold Amplitude: 0.875 V
Lead Channel Pacing Threshold Pulse Width: 0.4 ms
Lead Channel Pacing Threshold Pulse Width: 0.4 ms
Lead Channel Setting Pacing Amplitude: 2 V
Lead Channel Setting Pacing Amplitude: 2.5 V
Lead Channel Setting Pacing Pulse Width: 0.4 ms
Lead Channel Setting Sensing Sensitivity: 2.8 mV

## 2017-04-09 ENCOUNTER — Telehealth: Payer: Self-pay | Admitting: Cardiology

## 2017-04-09 ENCOUNTER — Encounter: Payer: Self-pay | Admitting: Cardiology

## 2017-04-09 ENCOUNTER — Ambulatory Visit (INDEPENDENT_AMBULATORY_CARE_PROVIDER_SITE_OTHER): Payer: Medicare Other | Admitting: *Deleted

## 2017-04-09 DIAGNOSIS — I442 Atrioventricular block, complete: Secondary | ICD-10-CM | POA: Diagnosis not present

## 2017-04-09 NOTE — Telephone Encounter (Signed)
Confirmed remote transmission w/ pt son.    

## 2017-04-09 NOTE — Progress Notes (Signed)
Remote pacemaker transmission.   

## 2017-04-10 LAB — CUP PACEART REMOTE DEVICE CHECK
Battery Impedance: 299 Ohm
Battery Remaining Longevity: 99 mo
Battery Voltage: 2.79 V
Brady Statistic AP VP Percent: 0 %
Brady Statistic AP VS Percent: 0 %
Brady Statistic AS VP Percent: 100 %
Brady Statistic AS VS Percent: 0 %
Date Time Interrogation Session: 20181203195528
Implantable Lead Implant Date: 20150120
Implantable Lead Implant Date: 20150120
Implantable Lead Location: 753859
Implantable Lead Location: 753860
Implantable Lead Model: 5076
Implantable Lead Model: 5076
Implantable Pulse Generator Implant Date: 20150120
Lead Channel Impedance Value: 455 Ohm
Lead Channel Impedance Value: 523 Ohm
Lead Channel Pacing Threshold Amplitude: 0.375 V
Lead Channel Pacing Threshold Amplitude: 1.125 V
Lead Channel Pacing Threshold Pulse Width: 0.4 ms
Lead Channel Pacing Threshold Pulse Width: 0.4 ms
Lead Channel Setting Pacing Amplitude: 2 V
Lead Channel Setting Pacing Amplitude: 2.5 V
Lead Channel Setting Pacing Pulse Width: 0.4 ms
Lead Channel Setting Sensing Sensitivity: 2.8 mV

## 2017-04-25 ENCOUNTER — Telehealth: Payer: Self-pay | Admitting: Internal Medicine

## 2017-04-25 ENCOUNTER — Ambulatory Visit: Payer: Self-pay

## 2017-04-25 NOTE — Telephone Encounter (Signed)
Monitor for ER arrival 

## 2017-04-25 NOTE — Telephone Encounter (Signed)
Patient's daughter (DPR) calling about her mother's BP being high at 173/103, SOB, right arm pain, and not feeling well. Patient lives at [facility] where someone noticed patient not feeling well, and checked her BP. Patient has not been taking her medications, patient's daughter makes it sound like patient is noncompliant with taking her medications. Encouraged daughter that patient needs to be evaluated, probably by her PCP or go to the ED. Patient has a history of A. FIB and has a PPM. Not sure if her last remote check was fine. Will send to device. Patient's daughter stated she would call patient's PCP to see if patient can get in to see them.

## 2017-04-25 NOTE — Telephone Encounter (Signed)
New Message      Per daughter , patient has not been taking her medication and her bp is really high?  Pt c/o BP issue: STAT if pt c/o blurred vision, one-sided weakness or slurred speech  1. What are your last 5 BP readings?  173/103  2. Are you having any other symptoms (ex. Dizziness, headache, blurred vision, passed out)? Patient was holding her rt arm and not feeling well (at [facility] ) have sob   3. What is your BP issue? Would you advise them to double up the medication ?  At the time of call patient has not taken her medication today (daughter advised her to take it before she called the office)

## 2017-04-25 NOTE — Telephone Encounter (Signed)
Pt's daughter called stating she rec'd phone call from [facility], with report of pt. c/o right arm pain, and BP 173/103.  Daughter reported she called the pt. to tell her to take her BP medication at this time, because she is usually noncompliant with this.  Stated the pt. has a pacemaker.  Reported when she spoke with the pt., she noticed she was more short of breath than usual; stated unable to complete sentences without stopping to get her breath. Daughter advised with above symptoms, she should take pt. to the ER.   Verb. Understanding.   Reason for Disposition . [2] Systolic BP  >= 951 OR Diastolic >= 884 AND [1] cardiac or neurologic symptoms (e.g., chest pain, difficulty breathing, unsteady gait, blurred vision)  Answer Assessment - Initial Assessment Questions 1. BLOOD PRESSURE: "What is the blood pressure?" "Did you take at least two measurements 5 minutes apart?"     173/103 within last hour 2. ONSET: "When did you take your blood pressure?"    Taken within last hour 3. HOW: "How did you obtain the blood pressure?" (e.g., visiting nurse, automatic home BP monitor)     Physical Therapist took it at [place]  4. HISTORY: "Do you have a history of high blood pressure?"    Yes 5. MEDICATIONS: "Are you taking any medications for blood pressure?" "Have you missed any doses recently?"     Daughter reported the pt. Is not compliant with taking BP medication.   6. OTHER SYMPTOMS: "Do you have any symptoms?" (e.g., headache, chest pain, blurred vision, difficulty breathing, weakness)     daughter reports pt. c/o shortness of breath, c/o right arm pain 7. PREGNANCY: "Is there any chance you are pregnant?" "When was your last menstrual period?"    no  Protocols used: HIGH BLOOD PRESSURE-A-AH

## 2017-04-25 NOTE — Telephone Encounter (Signed)
Transmission 04/09/17. Normal remote. 3 episodes of ATach, longest lasting 2 minutes. No recommendations from EP. Routine EP follow up.  Pt given information that based on transmission done 04/09/17 there were no recommendations from EP, routine EP follow-up.

## 2017-04-25 NOTE — Telephone Encounter (Signed)
Transmission 04/09/17. Normal remote. 3 episodes of ATach, longest lasting 2 minutes. No recommendations from EP. Routine EP follow up.

## 2017-04-26 NOTE — Telephone Encounter (Signed)
Just for review only, pt forgot to take blood pressure medication until yesterday afternoon.

## 2017-04-26 NOTE — Telephone Encounter (Signed)
I spoke with daughter Theresa Norton this morning. I called to follow up on possible ER visit. Theresa Norton says that pt did not go to ER, she went to check on pt yesterday afternoon around 4 pm and pt was doing better, pt had missed her blood pressure that day and daughter gave to her when she got there. They went for dinner and pt did not have any SOB, still had little shoulder pain but says it is arthritis. They cannot find pt's blood pressure cuff at this time to recheck it, they are still looking for it.

## 2017-05-16 ENCOUNTER — Ambulatory Visit (INDEPENDENT_AMBULATORY_CARE_PROVIDER_SITE_OTHER): Payer: Medicare Other | Admitting: Family Medicine

## 2017-05-16 ENCOUNTER — Encounter: Payer: Self-pay | Admitting: Family Medicine

## 2017-05-16 VITALS — BP 140/80 | HR 98 | Temp 97.9°F | Wt 170.8 lb

## 2017-05-16 DIAGNOSIS — R0600 Dyspnea, unspecified: Secondary | ICD-10-CM

## 2017-05-16 DIAGNOSIS — M545 Low back pain, unspecified: Secondary | ICD-10-CM

## 2017-05-16 DIAGNOSIS — R300 Dysuria: Secondary | ICD-10-CM

## 2017-05-16 DIAGNOSIS — R4189 Other symptoms and signs involving cognitive functions and awareness: Secondary | ICD-10-CM

## 2017-05-16 LAB — POCT URINALYSIS DIPSTICK
Bilirubin, UA: NEGATIVE
Blood, UA: NEGATIVE
Glucose, UA: NEGATIVE
Ketones, UA: NEGATIVE
Nitrite, UA: POSITIVE
Protein, UA: NEGATIVE
Spec Grav, UA: 1.02 (ref 1.010–1.025)
Urobilinogen, UA: 0.2 E.U./dL
pH, UA: 6.5 (ref 5.0–8.0)

## 2017-05-16 MED ORDER — CIPROFLOXACIN HCL 500 MG PO TABS: 500.0000 mg | ORAL_TABLET | Freq: Two times a day (BID) | ORAL | 0 refills | Status: DC

## 2017-05-16 NOTE — Patient Instructions (Signed)

## 2017-05-16 NOTE — Progress Notes (Signed)
Subjective:     Patient ID: Theresa Norton, female   DOB: Jan 14, 1929, 84 y.o.   MRN: [id number]  HPI  Patient seen with low back pain past 5 days. This seems to be slightly more left-sided. Denies any fall or injury. She has also had some mild increased urine frequency and mild burning with urination. History is somewhat difficult as she has some cognitive impairment. No reported fever. No nausea or vomiting. No gross hematuria. No recent hospitalization or instrumentation of bladder.  Also they relate she's had some mild shortness of breath at rest. She has some occasional sensation of air hunger with sitting still. They have not noted dyspnea with activity such as walking and she denies any chest pain whatsoever. No cough. No fever. Echocardiogram 2016 ejection fraction 65-70%  Past Medical History:  Diagnosis Date  . Allergy   . Anemia   . Arthritis    "in my fingers" (07/08/2013)  . CAD (coronary artery disease)    a. 05/2013 nonobs dzs by cath.  . Colon polyps   . Groin hematoma    a. 05/2013 R groin hematoma post-cath - u/s 05/31/13 large 7.49 cm r inguinal hematoma extending into thigh, no psa or avf.  . H/O hiatal hernia   . Heart block AV second degree    a. s/p MDT Addapta dual chamber pacemaker 05/2013 by Dr Rayann Heman.  . Hypertension   . On home oxygen therapy    "2L maybe 12h/day" (07/08/2013)  . Pacemaker   . Pacemaker   . PAF (paroxysmal atrial fibrillation) ([place])    a. Found post-pacer 05/2013->Xarelto added;  b. 05/2013 Echo: EF 60-65%, mild LVH, nl wall motion w/o rwma.   Past Surgical History:  Procedure Laterality Date  . BREAST CYST EXCISION Right 1959; 1970's  . BUNIONECTOMY Bilateral 1967  . CARDIAC CATHETERIZATION    . CATARACT EXTRACTION W/ INTRAOCULAR LENS  IMPLANT, BILATERAL Bilateral   . DILATION AND CURETTAGE OF UTERUS    . INSERT / REPLACE / REMOVE PACEMAKER  05/2013   MDT ADDRL1 implanted by Dr Rayann Heman for heart block  . INSERT / REPLACE / REMOVE PACEMAKER   06/2013   Right ventricular lead perforation with tamponade  . LEAD REVISION N/A 06/13/2013   Procedure: LEAD REVISION;  Surgeon: Coralyn Mark, MD;  Location: [facility];  Service: Cardiovascular;  Laterality: N/A;  . LEFT HEART CATHETERIZATION WITH CORONARY ANGIOGRAM N/A 05/27/2013   Procedure: LEFT HEART CATHETERIZATION WITH CORONARY ANGIOGRAM;  Surgeon: Birdie Riddle, MD;  Location: [facility];  Service: Cardiovascular;  Laterality: N/A;  . PERICARDIAL TAP N/A 06/11/2013   Procedure: PERICARDIAL TAP;  Surgeon: Birdie Riddle, MD;  Location: [facility];  Service: Cardiovascular;  Laterality: N/A;  . PERMANENT PACEMAKER INSERTION N/A 05/27/2013   Procedure: PERMANENT PACEMAKER INSERTION;  Surgeon: Coralyn Mark, MD;  Location: [facility];  Service: Cardiovascular;  Laterality: N/A;  . TEMPORARY PACEMAKER INSERTION N/A 05/27/2013   Procedure: TEMPORARY PACEMAKER INSERTION;  Surgeon: Birdie Riddle, MD;  Location: [facility];  Service: Cardiovascular;  Laterality: N/A;  . VARICOSE VEIN SURGERY Bilateral ~ 2002    reports that  has never smoked. she has never used smokeless tobacco. She reports that she drinks about 8.4 oz of alcohol per week. She reports that she does not use drugs. family history includes Cancer in her father. Allergies  Allergen Reactions  . Codeine     GI upset  . Morphine And Related  GI upset    Review of Systems  Constitutional: Negative for chills and fever.  Respiratory: Positive for shortness of breath. Negative for cough and wheezing.   Cardiovascular: Negative for chest pain.  Gastrointestinal: Negative for abdominal pain, nausea and vomiting.  Genitourinary: Positive for dysuria.  Musculoskeletal: Positive for back pain.       Objective:   Physical Exam  Constitutional: She appears well-developed and well-nourished.  Cardiovascular: Normal rate and regular rhythm.  Pulmonary/Chest: Effort normal and breath sounds normal. No respiratory distress.  She has no wheezes. She has no rales.  Abdominal: Soft. There is no tenderness.  Musculoskeletal: She exhibits no edema.  Neurological: She is alert.       Assessment:     #1 dysuria. Urine dipstick reveals nitrites and leukocytes. Rule out UTI  #2 low back pain possibly related to #1  #3 dyspnea. No respiratory distress. Question air hunger (anxiety) symptoms. She's not had any exertional dyspnea or chest pain or other concerning symptoms    Plan:     -Urine culture sent -Cipro 500 mg twice a day for 7 days pending culture results -stay well-hydrated -Follow-up promptly for any fever, vomiting, or other concerns  Eulas Post MD [facility] at [facility]

## 2017-05-18 LAB — URINE CULTURE
MICRO NUMBER:: 90034839
SPECIMEN QUALITY:: ADEQUATE

## 2017-07-09 ENCOUNTER — Ambulatory Visit (INDEPENDENT_AMBULATORY_CARE_PROVIDER_SITE_OTHER): Payer: Medicare Other | Admitting: *Deleted

## 2017-07-09 ENCOUNTER — Telehealth: Payer: Self-pay | Admitting: Cardiology

## 2017-07-09 DIAGNOSIS — I442 Atrioventricular block, complete: Secondary | ICD-10-CM | POA: Diagnosis not present

## 2017-07-09 NOTE — Telephone Encounter (Signed)
Confirmed remote transmission w/ pt son.    

## 2017-07-10 LAB — CUP PACEART REMOTE DEVICE CHECK
Battery Impedance: 322 Ohm
Battery Remaining Longevity: 96 mo
Battery Voltage: 2.79 V
Brady Statistic AP VP Percent: 0 %
Brady Statistic AP VS Percent: 0 %
Brady Statistic AS VP Percent: 100 %
Brady Statistic AS VS Percent: 0 %
Date Time Interrogation Session: 20190304170633
Implantable Lead Implant Date: 20150120
Implantable Lead Implant Date: 20150120
Implantable Lead Location: 753859
Implantable Lead Location: 753860
Implantable Lead Model: 5076
Implantable Lead Model: 5076
Implantable Pulse Generator Implant Date: 20150120
Lead Channel Impedance Value: 450 Ohm
Lead Channel Impedance Value: 511 Ohm
Lead Channel Pacing Threshold Amplitude: 0.375 V
Lead Channel Pacing Threshold Amplitude: 1 V
Lead Channel Pacing Threshold Pulse Width: 0.4 ms
Lead Channel Pacing Threshold Pulse Width: 0.4 ms
Lead Channel Setting Pacing Amplitude: 2 V
Lead Channel Setting Pacing Amplitude: 2.5 V
Lead Channel Setting Pacing Pulse Width: 0.4 ms
Lead Channel Setting Sensing Sensitivity: 2.8 mV

## 2017-07-10 NOTE — Progress Notes (Signed)
Remote pacemaker transmission.   

## 2017-07-11 ENCOUNTER — Encounter: Payer: Self-pay | Admitting: Cardiology

## 2017-10-08 ENCOUNTER — Ambulatory Visit (INDEPENDENT_AMBULATORY_CARE_PROVIDER_SITE_OTHER): Payer: Medicare Other | Admitting: *Deleted

## 2017-10-08 DIAGNOSIS — I442 Atrioventricular block, complete: Secondary | ICD-10-CM | POA: Diagnosis not present

## 2017-10-08 NOTE — Progress Notes (Signed)
Remote pacemaker transmission.   

## 2017-10-18 LAB — CUP PACEART REMOTE DEVICE CHECK
Battery Impedance: 370 Ohm
Battery Remaining Longevity: 92 mo
Battery Voltage: 2.79 V
Brady Statistic AP VP Percent: 1 %
Brady Statistic AP VS Percent: 0 %
Brady Statistic AS VP Percent: 99 %
Brady Statistic AS VS Percent: 0 %
Date Time Interrogation Session: 20190603153750
Implantable Lead Implant Date: 20150120
Implantable Lead Implant Date: 20150120
Implantable Lead Location: 753859
Implantable Lead Location: 753860
Implantable Lead Model: 5076
Implantable Lead Model: 5076
Implantable Pulse Generator Implant Date: 20150120
Lead Channel Impedance Value: 455 Ohm
Lead Channel Impedance Value: 515 Ohm
Lead Channel Pacing Threshold Amplitude: 0.375 V
Lead Channel Pacing Threshold Amplitude: 1 V
Lead Channel Pacing Threshold Pulse Width: 0.4 ms
Lead Channel Pacing Threshold Pulse Width: 0.4 ms
Lead Channel Setting Pacing Amplitude: 2 V
Lead Channel Setting Pacing Amplitude: 2.5 V
Lead Channel Setting Pacing Pulse Width: 0.4 ms
Lead Channel Setting Sensing Sensitivity: 2.8 mV

## 2017-10-27 ENCOUNTER — Other Ambulatory Visit: Payer: Self-pay | Admitting: Family Medicine

## 2017-11-25 ENCOUNTER — Other Ambulatory Visit: Payer: Self-pay | Admitting: Family Medicine

## 2017-11-26 ENCOUNTER — Other Ambulatory Visit: Payer: Self-pay | Admitting: Family Medicine

## 2018-01-08 ENCOUNTER — Ambulatory Visit (INDEPENDENT_AMBULATORY_CARE_PROVIDER_SITE_OTHER): Payer: Medicare Other | Admitting: *Deleted

## 2018-01-08 ENCOUNTER — Telehealth: Payer: Self-pay | Admitting: Cardiology

## 2018-01-08 DIAGNOSIS — I442 Atrioventricular block, complete: Secondary | ICD-10-CM | POA: Diagnosis not present

## 2018-01-08 NOTE — Telephone Encounter (Signed)
Confirmed remote transmission w/ pt son.    

## 2018-01-09 NOTE — Progress Notes (Signed)
Remote pacemaker transmission.   

## 2018-01-25 ENCOUNTER — Ambulatory Visit: Payer: Medicare Other

## 2018-01-30 LAB — CUP PACEART REMOTE DEVICE CHECK
Battery Impedance: 371 Ohm
Battery Remaining Longevity: 92 mo
Battery Voltage: 2.79 V
Brady Statistic AP VP Percent: 0 %
Brady Statistic AP VS Percent: 0 %
Brady Statistic AS VP Percent: 99 %
Brady Statistic AS VS Percent: 0 %
Date Time Interrogation Session: 20190903204233
Implantable Lead Implant Date: 20150120
Implantable Lead Implant Date: 20150120
Implantable Lead Location: 753859
Implantable Lead Location: 753860
Implantable Lead Model: 5076
Implantable Lead Model: 5076
Implantable Pulse Generator Implant Date: 20150120
Lead Channel Impedance Value: 474 Ohm
Lead Channel Impedance Value: 518 Ohm
Lead Channel Pacing Threshold Amplitude: 0.5 V
Lead Channel Pacing Threshold Amplitude: 1 V
Lead Channel Pacing Threshold Pulse Width: 0.4 ms
Lead Channel Pacing Threshold Pulse Width: 0.4 ms
Lead Channel Setting Pacing Amplitude: 2 V
Lead Channel Setting Pacing Amplitude: 2.5 V
Lead Channel Setting Pacing Pulse Width: 0.4 ms
Lead Channel Setting Sensing Sensitivity: 2.8 mV

## 2018-02-05 NOTE — Progress Notes (Deleted)
Subjective:   Theresa Norton is a 84 y.o. female who presents for Medicare Annual (Subsequent) preventive examination.  Reports health as ETOH issues last year   Ms Schellhorn's son accompanies her today  Sates she lives in [place] room at [facility] been there since Jan  Or Feb Her meals are provided Has 2 sons and a dtr; all who check on her  Has been there is Jan of this year  Dtr will come and help her shower   Encouraged her to stop drinking x 2 weeks; MMSE 28/30; would not engage socially; possible depression  Diet  Exercise  Health Maintenance Due  Topic Date Due  . DEXA SCAN  09/06/1995  . PNA vac Low Risk Adult (1 of 2 - PCV13) 09/06/1995  . TETANUS/TDAP  04/11/2007          Objective:     Vitals: There were no vitals taken for this visit.  There is no height or weight on file to calculate BMI.  Advanced Directives 01/23/2017 02/12/2016 07/08/2013 06/12/2013 05/26/2013  Does Patient Have a Medical Advance Directive? Yes No Patient has advance directive, copy not in chart Patient has advance directive, copy not in chart Patient has advance directive, copy not in chart  Type of Advance Directive - - - Living will Living will  Does patient want to make changes to medical advance directive? - - No change requested No No  Copy of Healthcare Power of Attorney in Chart? - - Copy requested from other (Comment) Copy requested from family Copy requested from family  Would patient like information on creating a medical advance directive? - No - patient declined information - - -  Pre-existing out of facility DNR order (yellow form or pink MOST form) - - - No -    Tobacco Social History   Tobacco Use  Smoking Status Never Smoker  Smokeless Tobacco Never Used     Counseling given: Not Answered   Clinical Intake:     Past Medical History:  Diagnosis Date  . Allergy   . Anemia   . Arthritis    "in my fingers" (07/08/2013)  . CAD (coronary artery  disease)    a. 05/2013 nonobs dzs by cath.  . Colon polyps   . Groin hematoma    a. 05/2013 R groin hematoma post-cath - u/s 05/31/13 large 7.49 cm r inguinal hematoma extending into thigh, no psa or avf.  . H/O hiatal hernia   . Heart block AV second degree    a. s/p MDT Addapta dual chamber pacemaker 05/2013 by Dr Rayann Heman.  . Hypertension   . On home oxygen therapy    "2L maybe 12h/day" (07/08/2013)  . Pacemaker   . Pacemaker   . PAF (paroxysmal atrial fibrillation) ([place])    a. Found post-pacer 05/2013->Xarelto added;  b. 05/2013 Echo: EF 60-65%, mild LVH, nl wall motion w/o rwma.   Past Surgical History:  Procedure Laterality Date  . BREAST CYST EXCISION Right 1959; 1970's  . BUNIONECTOMY Bilateral 1967  . CARDIAC CATHETERIZATION    . CATARACT EXTRACTION W/ INTRAOCULAR LENS  IMPLANT, BILATERAL Bilateral   . DILATION AND CURETTAGE OF UTERUS    . INSERT / REPLACE / REMOVE PACEMAKER  05/2013   MDT ADDRL1 implanted by Dr Rayann Heman for heart block  . INSERT / REPLACE / REMOVE PACEMAKER  06/2013   Right ventricular lead perforation with tamponade  . LEAD REVISION N/A 06/13/2013   Procedure: LEAD REVISION;  Surgeon:  Coralyn Mark, MD;  Location: [facility];  Service: Cardiovascular;  Laterality: N/A;  . LEFT HEART CATHETERIZATION WITH CORONARY ANGIOGRAM N/A 05/27/2013   Procedure: LEFT HEART CATHETERIZATION WITH CORONARY ANGIOGRAM;  Surgeon: Birdie Riddle, MD;  Location: [facility];  Service: Cardiovascular;  Laterality: N/A;  . PERICARDIAL TAP N/A 06/11/2013   Procedure: PERICARDIAL TAP;  Surgeon: Birdie Riddle, MD;  Location: [facility];  Service: Cardiovascular;  Laterality: N/A;  . PERMANENT PACEMAKER INSERTION N/A 05/27/2013   Procedure: PERMANENT PACEMAKER INSERTION;  Surgeon: Coralyn Mark, MD;  Location: [facility];  Service: Cardiovascular;  Laterality: N/A;  . TEMPORARY PACEMAKER INSERTION N/A 05/27/2013   Procedure: TEMPORARY PACEMAKER INSERTION;  Surgeon: Birdie Riddle, MD;  Location:  [facility];  Service: Cardiovascular;  Laterality: N/A;  . VARICOSE VEIN SURGERY Bilateral ~ 2002   Family History  Problem Relation Age of Onset  . Cancer Father        lung   Social History   Socioeconomic History  . Marital status: Married    Spouse name: Not on file  . Number of children: Not on file  . Years of education: Not on file  . Highest education level: Not on file  Occupational History  . Not on file  Social Needs  . Financial resource strain: Not on file  . Food insecurity:    Worry: Not on file    Inability: Not on file  . Transportation needs:    Medical: Not on file    Non-medical: Not on file  Tobacco Use  . Smoking status: Never Smoker  . Smokeless tobacco: Never Used  Substance and Sexual Activity  . Alcohol use: Yes    Alcohol/week: 14.0 standard drinks    Types: 7 Glasses of wine, 7 Cans of beer per week    Comment: 07/08/2013 "2 glasses of wine or beer q every day or more  . Drug use: No  . Sexual activity: Never  Lifestyle  . Physical activity:    Days per week: Not on file    Minutes per session: Not on file  . Stress: Not on file  Relationships  . Social connections:    Talks on phone: Not on file    Gets together: Not on file    Attends religious service: Not on file    Active member of club or organization: Not on file    Attends meetings of clubs or organizations: Not on file    Relationship status: Not on file  Other Topics Concern  . Not on file  Social History Narrative  . Not on file    Outpatient Encounter Medications as of 02/06/2018  Medication Sig  . acetaminophen (TYLENOL) 325 MG tablet Take 325 mg by mouth every 6 (six) hours as needed for mild pain.  Marland Kitchen amLODipine (NORVASC) 5 MG tablet TAKE 1 TABLET BY MOUTH DAILY  . beta carotene w/minerals (OCUVITE) tablet Take 1 tablet by mouth daily.  . ciprofloxacin (CIPRO) 500 MG tablet Take 1 tablet (500 mg total) by mouth 2 (two) times daily.  Marland Kitchen dextromethorphan-guaiFENesin  (MUCINEX DM) 30-600 MG 12hr tablet Take 1 tablet by mouth 2 (two) times daily as needed for cough.  . donepezil (ARICEPT) 5 MG tablet Take 1 tablet (5 mg total) by mouth at bedtime.  Marland Kitchen loratadine (CLARITIN) 10 MG tablet Take 10 mg by mouth daily as needed for allergies.   Marland Kitchen sertraline (ZOLOFT) 50 MG tablet TAKE 1 TABLET(50  MG) BY MOUTH DAILY  . sertraline (ZOLOFT) 50 MG tablet TAKE 1 TABLET(50 MG) BY MOUTH DAILY  . TURMERIC PO Take 1 capsule by mouth daily.    No facility-administered encounter medications on file as of 02/06/2018.     Activities of Daily Living No flowsheet data found.  Patient Care Team: Eulas Post, MD as PCP - General (Family Medicine)    Assessment:   This is a routine wellness examination for Theresa Norton.  Exercise Activities and Dietary recommendations    Goals    . patient     Join and participate on one activity       Fall Risk Fall Risk  01/23/2017 04/13/2016 04/22/2015 04/16/2014 04/16/2014  Falls in the past year? Yes No No Yes No  Comment - Emmi Telephone Survey: data to providers prior to load - - -  Number falls in past yr: - - - 1 -  Injury with Fall? Yes - - Yes -  Risk for fall due to : Impaired mobility - - - -     Depression Screen PHQ 2/9 Scores 01/23/2017 04/22/2015 04/16/2014 02/20/2013  PHQ - 2 Score 0 0 0 0     Cognitive Function MMSE - Mini Mental State Exam 01/23/2017  Orientation to time 3  Orientation to Place 5  Registration 3  Attention/ Calculation 5  Recall 3  Language- name 2 objects 2  Language- repeat 1  Language- follow 3 step command 3  Language- read & follow direction 1  Write a sentence 1  Copy design 1  Total score 28        Immunization History  Administered Date(s) Administered  . Td 04/10/1997      Screening Tests Health Maintenance  Topic Date Due  . DEXA SCAN  09/06/1995  . PNA vac Low Risk Adult (1 of 2 - PCV13) 09/06/1995  . TETANUS/TDAP  04/11/2007  . INFLUENZA VACCINE   07/22/2019 (Originally 12/06/2017)        Plan:      PCP Notes ***  Health Maintenance ***  Abnormal Screens  ***  Referrals  ***  Patient concerns; ***  Nurse Concerns; ***  Next PCP apt ***      I have personally reviewed and noted the following in the patient's chart:   . Medical and social history . Use of alcohol, tobacco or illicit drugs  . Current medications and supplements . Functional ability and status . Nutritional status . Physical activity . Advanced directives . List of other physicians . Hospitalizations, surgeries, and ER visits in previous 12 months . Vitals . Screenings to include cognitive, depression, and falls . Referrals and appointments  In addition, I have reviewed and discussed with patient certain preventive protocols, quality metrics, and best practice recommendations. A written personalized care plan for preventive services as well as general preventive health recommendations were provided to patient.     PRFFM,BWGYK, RN  02/05/2018

## 2018-02-06 ENCOUNTER — Ambulatory Visit: Payer: Medicare Other

## 2018-02-12 NOTE — Progress Notes (Addendum)
Subjective:   Theresa Norton is a 84 y.o. female who presents for Medicare Annual (Subsequent) preventive examination.  Reports health as good   Theresa Norton's son accompanies her today  Sates she lives in [place] room at [facility]/  Her meals are provided Has 2 sons and a dtr; all who check on her   Typical day; Gets up and eats in the am Goes to [place];  Gave up the exercise;    Diet Chol/hdl 2   Exercise Encouraging to exercise  Gait seemed unsteady today Recommended PT but needs overall exam by Dr. Elease Hashimoto. Will make apt for physical and blood work and evaluation of gait   Wine in the evening at times   There are no preventive care reminders to display for this patient.  Declines screenings  Declines dexa and will reconsider if she has a fx  Does not take vaccines  Colonoscopy 06/2014 - states this was done Mammogram 05/2007   Vitals: BP (!) 140/100   Pulse 80   Ht 5' (1.524 m)   Wt 175 lb 2 oz (79.4 kg)   SpO2 95%   BMI 34.20 kg/m   Body mass index is 34.2 kg/m.  Advanced Directives 02/13/2018 01/23/2017 02/12/2016 07/08/2013 06/12/2013 05/26/2013  Does Patient Have a Medical Advance Directive? Yes Yes No Patient has advance directive, copy not in chart Patient has advance directive, copy not in chart Patient has advance directive, copy not in chart  Type of Advance Directive - - - - Living will Living will  Does patient want to make changes to medical advance directive? - - - No change requested No No  Copy of Healthcare Power of Attorney in Chart? - - - Copy requested from other (Comment) Copy requested from family Copy requested from family  Would patient like information on creating a medical advance directive? - - No - patient declined information - - -  Pre-existing out of facility DNR order (yellow form or pink MOST form) - - - - No -    Tobacco Social History   Tobacco Use  Smoking Status Never Smoker  Smokeless Tobacco Never Used       Counseling given: Yes   Clinical Intake:     Past Medical History:  Diagnosis Date  . Allergy   . Anemia   . Arthritis    "in my fingers" (07/08/2013)  . CAD (coronary artery disease)    a. 05/2013 nonobs dzs by cath.  . Colon polyps   . Groin hematoma    a. 05/2013 R groin hematoma post-cath - u/s 05/31/13 large 7.49 cm r inguinal hematoma extending into thigh, no psa or avf.  . H/O hiatal hernia   . Heart block AV second degree    a. s/p MDT Addapta dual chamber pacemaker 05/2013 by Dr Rayann Heman.  . Hypertension   . On home oxygen therapy    "2L maybe 12h/day" (07/08/2013)  . Pacemaker   . Pacemaker   . PAF (paroxysmal atrial fibrillation) ([place])    a. Found post-pacer 05/2013->Xarelto added;  b. 05/2013 Echo: EF 60-65%, mild LVH, nl wall motion w/o rwma.   Past Surgical History:  Procedure Laterality Date  . BREAST CYST EXCISION Right 1959; 1970's  . BUNIONECTOMY Bilateral 1967  . CARDIAC CATHETERIZATION    . CATARACT EXTRACTION W/ INTRAOCULAR LENS  IMPLANT, BILATERAL Bilateral   . DILATION AND CURETTAGE OF UTERUS    . INSERT / REPLACE / REMOVE PACEMAKER  05/2013  MDT ADDRL1 implanted by Dr Rayann Heman for heart block  . INSERT / REPLACE / REMOVE PACEMAKER  06/2013   Right ventricular lead perforation with tamponade  . LEAD REVISION N/A 06/13/2013   Procedure: LEAD REVISION;  Surgeon: Coralyn Mark, MD;  Location: [facility] C[facility] LAB;  Service: Cardiovascular;  Laterality: N/A;  . LEFT HEART CATHETERIZATION WITH CORONARY ANGIOGRAM N/A 05/27/2013   Procedure: LEFT HEART CATHETERIZATION WITH CORONARY ANGIOGRAM;  Surgeon: Birdie Riddle, MD;  Location: [facility] CATH LAB;  Service: Cardiovascular;  Laterality: N/A;  . PERICARDIAL TAP N/A 06/11/2013   Procedure: PERICARDIAL TAP;  Surgeon: Birdie Riddle, MD;  Location: [facility] CATH LAB;  Service: Cardiovascular;  Laterality: N/A;  . PERMANENT PACEMAKER INSERTION N/A 05/27/2013   Procedure: PERMANENT PACEMAKER INSERTION;  Surgeon: Coralyn Mark, MD;  Location: [facility]  CATH LAB;  Service: Cardiovascular;  Laterality: N/A;  . TEMPORARY PACEMAKER INSERTION N/A 05/27/2013   Procedure: TEMPORARY PACEMAKER INSERTION;  Surgeon: Birdie Riddle, MD;  Location: [facility] C[facility] LAB;  Service: Cardiovascular;  Laterality: N/A;  . VARICOSE VEIN SURGERY Bilateral ~ 2002   Family History  Problem Relation Age of Onset  . Cancer Father        lung   Social History   Socioeconomic History  . Marital status: Married    Spouse name: Not on file  . Number of children: Not on file  . Years of education: Not on file  . Highest education level: Not on file  Occupational History  . Not on file  Social Needs  . Financial resource strain: Not on file  . Food insecurity:    Worry: Not on file    Inability: Not on file  . Transportation needs:    Medical: Not on file    Non-medical: Not on file  Tobacco Use  . Smoking status: Never Smoker  . Smokeless tobacco: Never Used  Substance and Sexual Activity  . Alcohol use: Yes    Alcohol/week: 14.0 standard drinks    Types: 7 Glasses of wine, 7 Cans of beer per week    Comment: 2019: wine in the evening   . Drug use: No  . Sexual activity: Never  Lifestyle  . Physical activity:    Days per week: Not on file    Minutes per session: Not on file  . Stress: Not on file  Relationships  . Social connections:    Talks on phone: Not on file    Gets together: Not on file    Attends religious service: Not on file    Active member of club or organization: Not on file    Attends meetings of clubs or organizations: Not on file    Relationship status: Not on file  Other Topics Concern  . Not on file  Social History Narrative  . Not on file    Outpatient Encounter Medications as of 02/13/2018  Medication Sig  . acetaminophen (TYLENOL) 325 MG tablet Take 325 mg by mouth every 6 (six) hours as needed for mild pain.  Marland Kitchen amLODipine (NORVASC) 5 MG tablet TAKE 1 TABLET BY MOUTH DAILY  . beta carotene w/minerals (OCUVITE) tablet Take 1  tablet by mouth daily.  Marland Kitchen donepezil (ARICEPT) 5 MG tablet Take 1 tablet (5 mg total) by mouth at bedtime.  Marland Kitchen loratadine (CLARITIN) 10 MG tablet Take 10 mg by mouth daily as needed for allergies.   Marland Kitchen sertraline (ZOLOFT) 50 MG tablet TAKE 1 TABLET(50 MG) BY MOUTH DAILY  . sertraline (  ZOLOFT) 50 MG tablet TAKE 1 TABLET(50 MG) BY MOUTH DAILY  . TURMERIC PO Take 1 capsule by mouth daily.   Marland Kitchen dextromethorphan-guaiFENesin (MUCINEX DM) 30-600 MG 12hr tablet Take 1 tablet by mouth 2 (two) times daily as needed for cough.  . [DISCONTINUED] ciprofloxacin (CIPRO) 500 MG tablet Take 1 tablet (500 mg total) by mouth 2 (two) times daily.   No facility-administered encounter medications on file as of 02/13/2018.     Activities of Daily Living No flowsheet data found.  Patient Care Team: Eulas Post, MD as PCP - General (Family Medicine)    Assessment:   This is a routine wellness examination for Theresa Norton.  Exercise Activities and Dietary recommendations    Goals    . exercise     May join an exercise class prior to seeing Dr. Elease Hashimoto Walk around the building before lunch or after lunch    . patient     Join and participate on one activity       Fall Risk Fall Risk  01/23/2017 04/13/2016 04/22/2015 04/16/2014 04/16/2014  Falls in the past year? Yes No No Yes No  Comment - Emmi Telephone Survey: data to providers prior to load - - -  Number falls in past yr: - - - 1 -  Injury with Fall? Yes - - Yes -  Risk for fall due to : Impaired mobility - - - -     Depression Screen PHQ 2/9 Scores 01/23/2017 04/22/2015 04/16/2014 02/20/2013  PHQ - 2 Score 0 0 0 0     Cognitive Function MMSE - Mini Mental State Exam 01/23/2017  Orientation to time 3  Orientation to Place 5  Registration 3  Attention/ Calculation 5  Recall 3  Language- name 2 objects 2  Language- repeat 1  Language- follow 3 step command 3  Language- read & follow direction 1  Write a sentence 1  Copy design 1    Total score 28    Theresa Norton declined memory test  Was able to recall 2/3 words Missed one letter spelling world backwards Could state the months backwards and self correct Could count backwards from 20 to1  Failed clock test (3:35; one hand on the 3 and one hand between 5 and 6 )     Immunization History  Administered Date(s) Administered  . Td 04/10/1997      Screening Tests Health Maintenance  Topic Date Due  . DEXA SCAN  02/14/2019 (Originally 09/06/1995)  . TETANUS/TDAP  02/14/2019 (Originally 04/11/2007)  . PNA vac Low Risk Adult (1 of 2 - PCV13) 02/14/2019 (Originally 09/06/1995)  . INFLUENZA VACCINE  07/22/2019 (Originally 12/06/2017)       Plan:      PCP Notes   Health Maintenance Does not take vaccines  Declines screenings  Declines dexa and will reconsider if she has a fx  Colonoscopy 06/2014 - states this was done Mammogram 05/2007   Abnormal Screens  BP was slightly elevated but does not c.o of chest pain or sob Will recheck at her CPE to be scheduled  HR normal and steady  Referrals  Request she consider PT as gait seems less stable  No falls this year   Patient concerns; Needs handicapped parking sticker  Son also request a letter from the doctor so he can change her new address at the [place] locally.   States she had lunch yesterday and throat felt "different"  Poor historian; feels it was red- no issues today  Nurse Concerns; As noted   Next PCP apt 11/4  Theresa Norton BP 140/100; but no chest pain, sob or other complaints. Call placed to [facility] and will request the wellness nurse be sure she comes to their BP clinic on Friday. They hold these x 2 per week and will re-check her BP and call if it is elevated Wynetta Fines RN       I have personally reviewed and noted the following in the patient's chart:   . Medical and social history . Use of alcohol, tobacco or illicit drugs  . Current medications and  supplements . Functional ability and status . Nutritional status . Physical activity . Advanced directives . List of other physicians . Hospitalizations, surgeries, and ER visits in previous 12 months . Vitals . Screenings to include cognitive, depression, and falls . Referrals and appointments  In addition, I have reviewed and discussed with patient certain preventive protocols, quality metrics, and best practice recommendations. A written personalized care plan for preventive services as well as general preventive health recommendations were provided to patient.     WGNFA,OZHYQ, RN  02/13/2018  I have reviewed the documentation for the AWV and [place] provided by the health coach and agree with their documentation. I was immediately available for any questions  Eulas Post MD [facility] at [facility]

## 2018-02-13 ENCOUNTER — Telehealth: Payer: Self-pay

## 2018-02-13 ENCOUNTER — Other Ambulatory Visit: Payer: Self-pay | Admitting: Family Medicine

## 2018-02-13 ENCOUNTER — Ambulatory Visit (INDEPENDENT_AMBULATORY_CARE_PROVIDER_SITE_OTHER): Payer: Medicare Other

## 2018-02-13 VITALS — BP 140/100 | HR 80 | Ht 60.0 in | Wt 175.1 lb

## 2018-02-13 DIAGNOSIS — Z0000 Encounter for general adult medical examination without abnormal findings: Secondary | ICD-10-CM

## 2018-02-13 NOTE — Telephone Encounter (Signed)
Will schedule with Dr. Elease Hashimoto for a physical or medical exam blood work  Will get handicapped sticker for you  Will write a letter for your son to have your addressed changed with Medicare  [name] will call when ready  Nneka Blanda [phone]

## 2018-02-13 NOTE — Telephone Encounter (Signed)
Call to [facility] Ms. Samaras is in independent living and requested wellness nurse recheck her BP. They have BP clinic 2 times a week and will be sure her BP is rechecked tomorrow. Will call if elevated Wynetta Fines RN

## 2018-02-13 NOTE — Patient Instructions (Addendum)
Theresa Norton , Thank you for taking time to come for your Medicare Wellness Visit. I appreciate your ongoing commitment to your health goals. Please review the following plan we discussed and let me know if I can assist you in the future.   Will schedule with Dr. Elease Hashimoto for a physical or medical exam blood work  Will get handicapped sticker for you  Will write a letter for your son to have your addressed changed with Medicare  Theresa Norton will call when ready  Theresa Norton [phone]  You can have a hearing screen anywhere  [employer] hearing aids   [facility] - can assist with hearing aid x 1  No reviews  [facility]  [address]  [phone] [email or web address]   Please try to go to exercise class where you live  Will need to be evaluated for gait  More walking at [facility] can read about pneumonia vaccines from the CDC  Shingrix is a vaccine for the prevention of Shingles in Adults 50 and older.  If you are on Medicare, the shingrix is covered under your Part D plan, so you will take both of the vaccines in the series at your pharmacy. Please check with your benefits regarding applicable copays or out of pocket expenses.  The Shingrix is given in 2 vaccines approx 8 weeks apart. You must receive the 2nd dose prior to 6 months from receipt of the first. Please have the pharmacist print out you Immunization  dates for our office records   A Tetanus is recommended every 10 years. Medicare covers a tetanus if you have a cut or wound; otherwise, there may be a charge. If you had not had a tetanus with pertusses, known as the Tdap, you can take this anytime.    These are the goals we discussed: Goals    . patient     Join and participate on one activity       This is a list of the screening recommended for you and due dates:   Health Maintenance  Topic Date Due  . DEXA scan (bone density measurement)  09/06/1995  . Pneumonia vaccines (1 of 2 - PCV13) 09/06/1995  . Tetanus Vaccine  04/11/2007  . Flu Shot  07/22/2019*  *Topic was postponed. The date shown is not the original due date.      Fall Prevention in the Home Falls can cause injuries. They can happen to people of all ages. There are many things you can do to make your home safe and to help prevent falls. What can I do on the outside of my home?  Regularly fix the edges of walkways and driveways and fix any cracks.  Remove anything that might make you trip as you walk through a door, such as a raised step or threshold.  Trim any bushes or trees on the path to your home.  Use bright outdoor lighting.  Clear any walking paths of anything that might make someone trip, such as rocks or tools.  Regularly check to see if handrails are loose or broken. Make sure that both sides of any steps have handrails.  Any raised decks and porches should have guardrails on the edges.  Have any leaves, snow, or ice cleared regularly.  Use sand or salt on walking paths during winter.  Clean up any spills in your garage right away. This includes oil or grease spills. What can I do  in the bathroom?  Use night lights.  Install grab bars by the toilet and in the tub and shower. Do not use towel bars as grab bars.  Use non-skid mats or decals in the tub or shower.  If you need to sit down in the shower, use a plastic, non-slip stool.  Keep the floor dry. Clean up any water that spills on the floor as soon as it happens.  Remove soap buildup in the tub or shower regularly.  Attach bath mats securely with double-sided non-slip rug tape.  Do not have throw rugs and other things on the floor that can make you trip. What can I do in the bedroom?  Use night lights.  Make sure that you have a light by your bed that is easy to reach.  Do not use any sheets or  blankets that are too big for your bed. They should not hang down onto the floor.  Have a firm chair that has side arms. You can use this for support while you get dressed.  Do not have throw rugs and other things on the floor that can make you trip. What can I do in the kitchen?  Clean up any spills right away.  Avoid walking on wet floors.  Keep items that you use a lot in easy-to-reach places.  If you need to reach something above you, use a strong step stool that has a grab bar.  Keep electrical cords out of the way.  Do not use floor polish or wax that makes floors slippery. If you must use wax, use non-skid floor wax.  Do not have throw rugs and other things on the floor that can make you trip. What can I do with my stairs?  Do not leave any items on the stairs.  Make sure that there are handrails on both sides of the stairs and use them. Fix handrails that are broken or loose. Make sure that handrails are as long as the stairways.  Check any carpeting to make sure that it is firmly attached to the stairs. Fix any carpet that is loose or worn.  Avoid having throw rugs at the top or bottom of the stairs. If you do have throw rugs, attach them to the floor with carpet tape.  Make sure that you have a light switch at the top of the stairs and the bottom of the stairs. If you do not have them, ask someone to add them for you. What else can I do to help prevent falls?  Wear shoes that: ? Do not have high heels. ? Have rubber bottoms. ? Are comfortable and fit you well. ? Are closed at the toe. Do not wear sandals.  If you use a stepladder: ? Make sure that it is fully opened. Do not climb a closed stepladder. ? Make sure that both sides of the stepladder are locked into place. ? Ask someone to hold it for you, if possible.  Clearly mark and make sure that you can see: ? Any grab bars or handrails. ? First and last steps. ? Where the edge of each step is.  Use tools  that help you move around (mobility aids) if they are needed. These include: ? Canes. ? Walkers. ? Scooters. ? Crutches.  Turn on the lights when you go into a dark area. Replace any light bulbs as soon as they burn out.  Set up your furniture so you have a clear path. Avoid moving your furniture  around.  If any of your floors are uneven, fix them.  If there are any pets around you, be aware of where they are.  Review your medicines with your doctor. Some medicines can make you feel dizzy. This can increase your chance of falling. Ask your doctor what other things that you can do to help prevent falls. This information is not intended to replace advice given to you by your health care provider. Make sure you discuss any questions you have with your health care provider. Document Released: 02/18/2009 Document Revised: 09/30/2015 Document Reviewed: 05/29/2014 Elsevier Interactive Patient Education  2018 [place] Maintenance, Female Adopting a healthy lifestyle and getting preventive care can go a long way to promote health and wellness. Talk with your health care provider about what schedule of regular examinations is right for you. This is a good chance for you to check in with your provider about disease prevention and staying healthy. In between checkups, there are plenty of things you can do on your own. Experts have done a lot of research about which lifestyle changes and preventive measures are most likely to keep you healthy. Ask your health care provider for more information. Weight and diet Eat a healthy diet  Be sure to include plenty of vegetables, fruits, low-fat dairy products, and lean protein.  Do not eat a lot of foods high in solid fats, added sugars, or salt.  Get regular exercise. This is one of the most important things you can do for your health. ? Most adults should exercise for at least 150 minutes each week. The exercise should increase your heart  rate and make you sweat (moderate-intensity exercise). ? Most adults should also do strengthening exercises at least twice a week. This is in addition to the moderate-intensity exercise.  Maintain a healthy weight  Body mass index (BMI) is a measurement that can be used to identify possible weight problems. It estimates body fat based on height and weight. Your health care provider can help determine your BMI and help you achieve or maintain a healthy weight.  For females 30 years of age and older: ? A BMI below 18.5 is considered underweight. ? A BMI of 18.5 to 24.9 is normal. ? A BMI of 25 to 29.9 is considered overweight. ? A BMI of 30 and above is considered obese.  Watch levels of cholesterol and blood lipids  You should start having your blood tested for lipids and cholesterol at 83 years of age, then have this test every 5 years.  You may need to have your cholesterol levels checked more often if: ? Your lipid or cholesterol levels are high. ? You are older than 82 years of age. ? You are at high risk for heart disease.  Cancer screening Lung Cancer  Lung cancer screening is recommended for adults 37-20 years old who are at high risk for lung cancer because of a history of smoking.  A yearly low-dose CT scan of the lungs is recommended for people who: ? Currently smoke. ? Have quit within the past 15 years. ? Have at least a 30-pack-year history of smoking. A pack year is smoking an average of one pack of cigarettes a day for 1 year.  Yearly screening should continue until it has been 15 years since you quit.  Yearly screening should stop if you develop a health problem that would prevent you from having lung cancer treatment.  Breast Cancer  Practice breast self-awareness. This means understanding  how your breasts normally appear and feel.  It also means doing regular breast self-exams. Let your health care provider know about any changes, no matter how small.  If  you are in your 20s or 30s, you should have a clinical breast exam (CBE) by a health care provider every 1-3 years as part of a regular health exam.  If you are 70 or older, have a CBE every year. Also consider having a breast X-ray (mammogram) every year.  If you have a family history of breast cancer, talk to your health care provider about genetic screening.  If you are at high risk for breast cancer, talk to your health care provider about having an MRI and a mammogram every year.  Breast cancer gene (BRCA) assessment is recommended for women who have family members with BRCA-related cancers. BRCA-related cancers include: ? Breast. ? Ovarian. ? Tubal. ? Peritoneal cancers.  Results of the assessment will determine the need for genetic counseling and BRCA1 and BRCA2 testing.  Cervical Cancer Your health care provider may recommend that you be screened regularly for cancer of the pelvic organs (ovaries, uterus, and vagina). This screening involves a pelvic examination, including checking for microscopic changes to the surface of your cervix (Pap test). You may be encouraged to have this screening done every 3 years, beginning at age 24.  For women ages 55-65, health care providers may recommend pelvic exams and Pap testing every 3 years, or they may recommend the Pap and pelvic exam, combined with testing for human papilloma virus (HPV), every 5 years. Some types of HPV increase your risk of cervical cancer. Testing for HPV may also be done on women of any age with unclear Pap test results.  Other health care providers may not recommend any screening for nonpregnant women who are considered low risk for pelvic cancer and who do not have symptoms. Ask your health care provider if a screening pelvic exam is right for you.  If you have had past treatment for cervical cancer or a condition that could lead to cancer, you need Pap tests and screening for cancer for at least 20 years after your  treatment. If Pap tests have been discontinued, your risk factors (such as having a new sexual partner) need to be reassessed to determine if screening should resume. Some women have medical problems that increase the chance of getting cervical cancer. In these cases, your health care provider may recommend more frequent screening and Pap tests.  Colorectal Cancer  This type of cancer can be detected and often prevented.  Routine colorectal cancer screening usually begins at 82 years of age and continues through 82 years of age.  Your health care provider may recommend screening at an earlier age if you have risk factors for colon cancer.  Your health care provider may also recommend using home test kits to check for hidden blood in the stool.  A small camera at the end of a tube can be used to examine your colon directly (sigmoidoscopy or colonoscopy). This is done to check for the earliest forms of colorectal cancer.  Routine screening usually begins at age 15.  Direct examination of the colon should be repeated every 5-10 years through 82 years of age. However, you may need to be screened more often if early forms of precancerous polyps or small growths are found.  Skin Cancer  Check your skin from head to toe regularly.  Tell your health care provider about any new moles or  changes in moles, especially if there is a change in a mole's shape or color.  Also tell your health care provider if you have a mole that is larger than the size of a pencil eraser.  Always use sunscreen. Apply sunscreen liberally and repeatedly throughout the day.  Protect yourself by wearing long sleeves, pants, a wide-brimmed hat, and sunglasses whenever you are outside.  Heart disease, diabetes, and high blood pressure  High blood pressure causes heart disease and increases the risk of stroke. High blood pressure is more likely to develop in: ? People who have blood pressure in the high end of the normal  range (130-139/85-89 mm Hg). ? People who are overweight or obese. ? People who are African American.  If you are 65-84 years of age, have your blood pressure checked every 3-5 years. If you are 55 years of age or older, have your blood pressure checked every year. You should have your blood pressure measured twice-once when you are at a hospital or clinic, and once when you are not at a hospital or clinic. Record the average of the two measurements. To check your blood pressure when you are not at a hospital or clinic, you can use: ? An automated blood pressure machine at a pharmacy. ? A home blood pressure monitor.  If you are between 31 years and 23 years old, ask your health care provider if you should take aspirin to prevent strokes.  Have regular diabetes screenings. This involves taking a blood sample to check your fasting blood sugar level. ? If you are at a normal weight and have a low risk for diabetes, have this test once every three years after 82 years of age. ? If you are overweight and have a high risk for diabetes, consider being tested at a younger age or more often. Preventing infection Hepatitis B  If you have a higher risk for hepatitis B, you should be screened for this virus. You are considered at high risk for hepatitis B if: ? You were born in a country where hepatitis B is common. Ask your health care provider which countries are considered high risk. ? Your parents were born in a high-risk country, and you have not been immunized against hepatitis B (hepatitis B vaccine). ? You have HIV or AIDS. ? You use needles to inject street drugs. ? You live with someone who has hepatitis B. ? You have had sex with someone who has hepatitis B. ? You get hemodialysis treatment. ? You take certain medicines for conditions, including cancer, organ transplantation, and autoimmune conditions.  Hepatitis C  Blood testing is recommended for: ? Everyone born from [birth year] through  1963. ? Anyone with known risk factors for hepatitis C.  Sexually transmitted infections (STIs)  You should be screened for sexually transmitted infections (STIs) including gonorrhea and chlamydia if: ? You are sexually active and are younger than 82 years of age. ? You are older than 82 years of age and your health care provider tells you that you are at risk for this type of infection. ? Your sexual activity has changed since you were last screened and you are at an increased risk for chlamydia or gonorrhea. Ask your health care provider if you are at risk.  If you do not have HIV, but are at risk, it may be recommended that you take a prescription medicine daily to prevent HIV infection. This is called pre-exposure prophylaxis (PrEP). You are considered at risk if: ?  You are sexually active and do not regularly use condoms or know the HIV status of your partner(s). ? You take drugs by injection. ? You are sexually active with a partner who has HIV.  Talk with your health care provider about whether you are at high risk of being infected with HIV. If you choose to begin PrEP, you should first be tested for HIV. You should then be tested every 3 months for as long as you are taking PrEP. Pregnancy  If you are premenopausal and you may become pregnant, ask your health care provider about preconception counseling.  If you may become pregnant, take 400 to 800 micrograms (mcg) of folic acid every day.  If you want to prevent pregnancy, talk to your health care provider about birth control (contraception). Osteoporosis and menopause  Osteoporosis is a disease in which the bones lose minerals and strength with aging. This can result in serious bone fractures. Your risk for osteoporosis can be identified using a bone density scan.  If you are 47 years of age or older, or if you are at risk for osteoporosis and fractures, ask your health care provider if you should be screened.  Ask your health  care provider whether you should take a calcium or vitamin D supplement to lower your risk for osteoporosis.  Menopause may have certain physical symptoms and risks.  Hormone replacement therapy may reduce some of these symptoms and risks. Talk to your health care provider about whether hormone replacement therapy is right for you. Follow these instructions at home:  Schedule regular health, dental, and eye exams.  Stay current with your immunizations.  Do not use any tobacco products including cigarettes, chewing tobacco, or electronic cigarettes.  If you are pregnant, do not drink alcohol.  If you are breastfeeding, limit how much and how often you drink alcohol.  Limit alcohol intake to no more than 1 drink per day for nonpregnant women. One drink equals 12 ounces of beer, 5 ounces of wine, or 1 ounces of hard liquor.  Do not use street drugs.  Do not share needles.  Ask your health care provider for help if you need support or information about quitting drugs.  Tell your health care provider if you often feel depressed.  Tell your health care provider if you have ever been abused or do not feel safe at home. This information is not intended to replace advice given to you by your health care provider. Make sure you discuss any questions you have with your health care provider. Document Released: 11/07/2010 Document Revised: 09/30/2015 Document Reviewed: 01/26/2015 Elsevier Interactive Patient Education  [employer].

## 2018-02-18 NOTE — Telephone Encounter (Addendum)
Call from the Wellness nurse at [facility]  Stated Theresa Norton BP in Friday was 192/98. It was checked again and was 157/100.  Call to Theresa Norton; She has an apt with Dr. Elease Hashimoto on 11/4   Call to her son who brought her to the [facility]  Discussed BP and feel this is high but she has an apt 11/4. States "her BP is always been high". Stated I would let Dr. Elease Hashimoto know and he will advise. He is out of the office this week   Also Letter sent to Son requesting the SSA ;let him update her address. This was Theresa Norton's request. Also Handicapped sticker available for pick up.  Wynetta Fines rN

## 2018-02-19 NOTE — Telephone Encounter (Signed)
Call Back to the wellness clinic to continue to monitor her BP as needed. Dr. Elease Hashimoto will review when he is back in the office for apt, otherwise will see her 11/4 as scheduled. Wynetta Fines RN

## 2018-03-11 ENCOUNTER — Other Ambulatory Visit: Payer: Self-pay

## 2018-03-11 ENCOUNTER — Ambulatory Visit (INDEPENDENT_AMBULATORY_CARE_PROVIDER_SITE_OTHER): Payer: Medicare Other | Admitting: Family Medicine

## 2018-03-11 ENCOUNTER — Encounter: Payer: Self-pay | Admitting: Family Medicine

## 2018-03-11 VITALS — BP 128/80 | HR 80 | Temp 98.2°F | Ht 60.5 in | Wt 175.3 lb

## 2018-03-11 DIAGNOSIS — I10 Essential (primary) hypertension: Secondary | ICD-10-CM

## 2018-03-11 DIAGNOSIS — F339 Major depressive disorder, recurrent, unspecified: Secondary | ICD-10-CM | POA: Diagnosis not present

## 2018-03-11 DIAGNOSIS — L820 Inflamed seborrheic keratosis: Secondary | ICD-10-CM | POA: Diagnosis not present

## 2018-03-11 DIAGNOSIS — R4189 Other symptoms and signs involving cognitive functions and awareness: Secondary | ICD-10-CM

## 2018-03-11 DIAGNOSIS — Z9181 History of falling: Secondary | ICD-10-CM | POA: Diagnosis not present

## 2018-03-11 LAB — BASIC METABOLIC PANEL
BUN: 13 mg/dL (ref 6–23)
CO2: 26 mEq/L (ref 19–32)
Calcium: 9.3 mg/dL (ref 8.4–10.5)
Chloride: 98 mEq/L (ref 96–112)
Creatinine, Ser: 0.91 mg/dL (ref 0.40–1.20)
GFR: 62.08 mL/min (ref >60.00)
Glucose, Bld: 95 mg/dL (ref 70–99)
Potassium: 4.6 mEq/L (ref 3.5–5.1)
Sodium: 134 mEq/L — ABNORMAL LOW (ref 135–145)

## 2018-03-11 MED ORDER — DONEPEZIL HCL 10 MG PO TABS: 10.0000 mg | ORAL_TABLET | Freq: Every day | ORAL | 3 refills | Status: DC

## 2018-03-11 NOTE — Patient Instructions (Signed)

## 2018-03-11 NOTE — Progress Notes (Signed)
Subjective:     Patient ID: Theresa Norton, female   DOB: Jan 20, 1929, 84 y.o.   MRN: [id number]  HPI Patient seen for medical follow-up.  She had recent Medicare annual wellness visit.  There was some expressed concern for gait instability at that time.  Patient though denies any falls over the past year.  She thinks her gait is fairly stable.  She walks unassisted.  Her son has noted that she has somewhat of a wide based gait at times.  Chronic problems include history of hypertension, past history of atrial fibrillation, cognitive impairment, history of complete heart block, obesity.  She currently takes amlodipine and son states she occasionally skips or forgets doses.  She has been taking this more regularly recently and seems to be doing better when taking this with regularity.  She has past history of depression which is stable on sertraline.  She has history of cognitive impairment is on Aricept 5 mg daily.  No history of intolerance with this medication.  [facility] for the past 2 and half years.  Appetite and weight stable. Son is Medical laboratory scientific officer.  Patient has irritated skin lesion right base of neck.  Frequently rubs against clothing.  Present for several months and slowly growing  Past Medical History:  Diagnosis Date  . Allergy   . Anemia   . Arthritis    "in my fingers" (07/08/2013)  . CAD (coronary artery disease)    a. 05/2013 nonobs dzs by cath.  . Colon polyps   . Groin hematoma    a. 05/2013 R groin hematoma post-cath - u/s 05/31/13 large 7.49 cm r inguinal hematoma extending into thigh, no psa or avf.  . H/O hiatal hernia   . Heart block AV second degree    a. s/p MDT Addapta dual chamber pacemaker 05/2013 by Dr Rayann Heman.  . Hypertension   . On home oxygen therapy    "2L maybe 12h/day" (07/08/2013)  . Pacemaker   . Pacemaker   . PAF (paroxysmal atrial fibrillation) ([place])    a. Found post-pacer 05/2013->Xarelto added;  b. 05/2013  Echo: EF 60-65%, mild LVH, nl wall motion w/o rwma.   Past Surgical History:  Procedure Laterality Date  . BREAST CYST EXCISION Right 1959; 1970's  . BUNIONECTOMY Bilateral 1967  . CARDIAC CATHETERIZATION    . CATARACT EXTRACTION W/ INTRAOCULAR LENS  IMPLANT, BILATERAL Bilateral   . DILATION AND CURETTAGE OF UTERUS    . INSERT / REPLACE / REMOVE PACEMAKER  05/2013   MDT ADDRL1 implanted by Dr Rayann Heman for heart block  . INSERT / REPLACE / REMOVE PACEMAKER  06/2013   Right ventricular lead perforation with tamponade  . LEAD REVISION N/A 06/13/2013   Procedure: LEAD REVISION;  Surgeon: Coralyn Mark, MD;  Location: [place] CATH LAB;  Service: Cardiovascular;  Laterality: N/A;  . LEFT HEART CATHETERIZATION WITH CORONARY ANGIOGRAM N/A 05/27/2013   Procedure: LEFT HEART CATHETERIZATION WITH CORONARY ANGIOGRAM;  Surgeon: Birdie Riddle, MD;  Location: [place] CATH LAB;  Service: Cardiovascular;  Laterality: N/A;  . PERICARDIAL TAP N/A 06/11/2013   Procedure: PERICARDIAL TAP;  Surgeon: Birdie Riddle, MD;  Location: [place] CATH LAB;  Service: Cardiovascular;  Laterality: N/A;  . PERMANENT PACEMAKER INSERTION N/A 05/27/2013   Procedure: PERMANENT PACEMAKER INSERTION;  Surgeon: Coralyn Mark, MD;  Location: [place] CATH LAB;  Service: Cardiovascular;  Laterality: N/A;  . TEMPORARY PACEMAKER INSERTION N/A 05/27/2013   Procedure: TEMPORARY PACEMAKER INSERTION;  Surgeon: Birdie Riddle, MD;  Location: [facility] CATH LAB;  Service: Cardiovascular;  Laterality: N/A;  . VARICOSE VEIN SURGERY Bilateral ~ 2002    reports that she has never smoked. She has never used smokeless tobacco. She reports that she drinks about 14.0 standard drinks of alcohol per week. She reports that she does not use drugs. family history includes Cancer in her father. Allergies  Allergen Reactions  . Codeine     GI upset  . Morphine And Related Other (See Comments)    GI upset GI upset  . Other Other (See Comments)    Pain killers, unknown     Review  of Systems  Constitutional: Negative for fatigue and unexpected weight change.  Eyes: Negative for visual disturbance.  Respiratory: Negative for cough, chest tightness, shortness of breath and wheezing.   Cardiovascular: Negative for chest pain, palpitations and leg swelling.  Gastrointestinal: Negative for abdominal pain.  Genitourinary: Negative for dysuria.  Neurological: Negative for dizziness, seizures, syncope, weakness, light-headedness and headaches.       Objective:   Physical Exam  Constitutional: She appears well-developed and well-nourished.  Eyes: Pupils are equal, round, and reactive to light.  Neck: Neck supple. No JVD present. No thyromegaly present.  Cardiovascular: Normal rate and regular rhythm. Exam reveals no gallop.  Pulmonary/Chest: Effort normal and breath sounds normal. No respiratory distress. She has no wheezes. She has no rales.  Musculoskeletal: She exhibits no edema.  Neurological: She is alert.  Skin:  Patient has fleshy hyperkeratotic skin lesion about 4 mm diameter right lower neck region.  Well-demarcated base       Assessment:     #1 hypertension currently stable by today's reading on amlodipine  #2 irritated seborrheic keratosis right lower neck  #3 history of recurrent depression currently stable on sertraline  Recent PHQ-2=0.    #4 history of cognitive impairment.  Recent MMSE 28/30. (see recent Medical Wellness visit).      Plan:     -Flu vaccine recommended and patient declines -Check basic metabolic panel -We discussed possible physical therapy to reduce falls and she declines at this time -Increase Aricept to 10 mg once daily and plan repeat MMSE at follow-up in 6 months -Discussed risk and benefits of treatment irritated right neck lesion with liquid nitrogen.  We explained risk of blistering, pain, infection and patient consented.  This was treated without difficulty.  Follow-up closely for any signs of secondary infection  Eulas Post MD [facility]

## 2018-04-10 ENCOUNTER — Telehealth: Payer: Self-pay

## 2018-04-10 ENCOUNTER — Ambulatory Visit (INDEPENDENT_AMBULATORY_CARE_PROVIDER_SITE_OTHER): Payer: Medicare Other

## 2018-04-10 DIAGNOSIS — I442 Atrioventricular block, complete: Secondary | ICD-10-CM | POA: Diagnosis not present

## 2018-04-10 NOTE — Telephone Encounter (Signed)
Confirmed remote transmission w/ pt son.    

## 2018-04-11 NOTE — Progress Notes (Signed)
Remote pacemaker transmission.   

## 2018-04-16 ENCOUNTER — Encounter: Payer: Self-pay | Admitting: Cardiology

## 2018-05-12 LAB — CUP PACEART REMOTE DEVICE CHECK
Battery Impedance: 395 Ohm
Battery Remaining Longevity: 89 mo
Battery Voltage: 2.79 V
Brady Statistic AP VP Percent: 1 %
Brady Statistic AP VS Percent: 0 %
Brady Statistic AS VP Percent: 99 %
Brady Statistic AS VS Percent: 1 %
Date Time Interrogation Session: 20191204200603
Implantable Lead Implant Date: 20150120
Implantable Lead Implant Date: 20150120
Implantable Lead Location: 753859
Implantable Lead Location: 753860
Implantable Lead Model: 5076
Implantable Lead Model: 5076
Implantable Pulse Generator Implant Date: 20150120
Lead Channel Impedance Value: 474 Ohm
Lead Channel Impedance Value: 498 Ohm
Lead Channel Pacing Threshold Amplitude: 0.375 V
Lead Channel Pacing Threshold Amplitude: 1 V
Lead Channel Pacing Threshold Pulse Width: 0.4 ms
Lead Channel Pacing Threshold Pulse Width: 0.4 ms
Lead Channel Setting Pacing Amplitude: 2 V
Lead Channel Setting Pacing Amplitude: 2.5 V
Lead Channel Setting Pacing Pulse Width: 0.4 ms
Lead Channel Setting Sensing Sensitivity: 2.8 mV

## 2018-05-21 ENCOUNTER — Telehealth: Payer: Self-pay | Admitting: Family Medicine

## 2018-05-21 NOTE — Telephone Encounter (Signed)
Copied from [place] [id number]. Topic: Quick Communication - See Telephone Encounter >> May 21, 2018 12:02 PM Antonieta Iba C wrote: CRM for notification. See Telephone encounter for: 05/21/18.  Pt's son called in, he said that pt was previously on 5MG  of medication donepezil (ARICEPT), he said that pt has been having nightmares and thinks that it is due to the change in medication. Pt is currently taking donepezil (ARICEPT) 10 MG tablet, son would like to know if PCP could switch MG back to 5MG  instead?   Pharmacy: [facility] [place], [address] AT [address]

## 2018-05-21 NOTE — Telephone Encounter (Signed)
Please advise 

## 2018-05-21 NOTE — Telephone Encounter (Signed)
We can - although Aricept 5 mg is really consider sub-optimal dose for treating dementia.  I would consider discontinuing altogether if abnormal dreams at 10 mg.

## 2018-05-24 ENCOUNTER — Encounter: Payer: Self-pay | Admitting: Family Medicine

## 2018-05-24 NOTE — Telephone Encounter (Signed)
Called patient and LMOVM to return call  [place] for [facility] to Discuss results / PCP / recommendations / Schedule patient  Per Dr. Elease Hashimoto: We can - although Aricept 5 mg is really consider sub-optimal dose for treating dementia.  I would consider discontinuing altogether if abnormal dreams at 10 mg.    CRM Created.

## 2018-05-24 NOTE — Telephone Encounter (Signed)
This encounter was created in error - please disregard.

## 2018-05-24 NOTE — Telephone Encounter (Signed)
Message from Dr. Elease Hashimoto read to pt's son 'Al.'   Requesting RX of Aricept 5mg . Questioning if recommends discontinuing, please advise on a substitute. If so will DrElease Hashimoto call in RX for alternative med.

## 2018-05-27 ENCOUNTER — Other Ambulatory Visit: Payer: Self-pay

## 2018-05-27 ENCOUNTER — Other Ambulatory Visit: Payer: Self-pay | Admitting: Family Medicine

## 2018-05-27 MED ORDER — DONEPEZIL HCL 5 MG PO TABS: 5.0000 mg | ORAL_TABLET | Freq: Every day | ORAL | 1 refills | Status: DC

## 2018-05-27 NOTE — Telephone Encounter (Signed)
Let's send in the Aricept 5 mg dose- take one daily and set up one month follow up.

## 2018-05-27 NOTE — Telephone Encounter (Signed)
This medication has been sent to the pharmacy for the patient. [facility].   Per Dr. Elease Hashimoto he wants Theresa Norton to take this dosage and set up a follow up appointment in one month.  OK for [facility] to discuss and schedule.  CRM Created

## 2018-05-27 NOTE — Telephone Encounter (Signed)
Please see current message. Please advise.

## 2018-07-10 ENCOUNTER — Ambulatory Visit (INDEPENDENT_AMBULATORY_CARE_PROVIDER_SITE_OTHER): Payer: Medicare Other | Admitting: *Deleted

## 2018-07-10 DIAGNOSIS — I442 Atrioventricular block, complete: Secondary | ICD-10-CM

## 2018-07-11 LAB — CUP PACEART REMOTE DEVICE CHECK
Battery Impedance: 443 Ohm
Battery Remaining Longevity: 86 mo
Battery Voltage: 2.79 V
Brady Statistic AP VP Percent: 1 %
Brady Statistic AP VS Percent: 0 %
Brady Statistic AS VP Percent: 98 %
Brady Statistic AS VS Percent: 1 %
Date Time Interrogation Session: 20200304205653
Implantable Lead Implant Date: 20150120
Implantable Lead Implant Date: 20150120
Implantable Lead Location: 753859
Implantable Lead Location: 753860
Implantable Lead Model: 5076
Implantable Lead Model: 5076
Implantable Pulse Generator Implant Date: 20150120
Lead Channel Impedance Value: 495 Ohm
Lead Channel Impedance Value: 506 Ohm
Lead Channel Pacing Threshold Amplitude: 0.5 V
Lead Channel Pacing Threshold Amplitude: 1 V
Lead Channel Pacing Threshold Pulse Width: 0.4 ms
Lead Channel Pacing Threshold Pulse Width: 0.4 ms
Lead Channel Setting Pacing Amplitude: 2 V
Lead Channel Setting Pacing Amplitude: 2.5 V
Lead Channel Setting Pacing Pulse Width: 0.4 ms
Lead Channel Setting Sensing Sensitivity: 2.8 mV

## 2018-07-17 NOTE — Progress Notes (Signed)
Remote pacemaker transmission.   

## 2018-10-09 ENCOUNTER — Ambulatory Visit (INDEPENDENT_AMBULATORY_CARE_PROVIDER_SITE_OTHER): Payer: Medicare Other | Admitting: *Deleted

## 2018-10-09 DIAGNOSIS — I442 Atrioventricular block, complete: Secondary | ICD-10-CM | POA: Diagnosis not present

## 2018-10-10 ENCOUNTER — Telehealth: Payer: Self-pay

## 2018-10-10 LAB — CUP PACEART REMOTE DEVICE CHECK
Battery Impedance: 491 Ohm
Battery Remaining Longevity: 82 mo
Battery Voltage: 2.79 V
Brady Statistic AP VP Percent: 1 %
Brady Statistic AP VS Percent: 0 %
Brady Statistic AS VP Percent: 98 %
Brady Statistic AS VS Percent: 1 %
Date Time Interrogation Session: 20200604123320
Implantable Lead Implant Date: 20150120
Implantable Lead Implant Date: 20150120
Implantable Lead Location: 753859
Implantable Lead Location: 753860
Implantable Lead Model: 5076
Implantable Lead Model: 5076
Implantable Pulse Generator Implant Date: 20150120
Lead Channel Impedance Value: 496 Ohm
Lead Channel Impedance Value: 503 Ohm
Lead Channel Pacing Threshold Amplitude: 0.625 V
Lead Channel Pacing Threshold Amplitude: 0.875 V
Lead Channel Pacing Threshold Pulse Width: 0.4 ms
Lead Channel Pacing Threshold Pulse Width: 0.4 ms
Lead Channel Sensing Intrinsic Amplitude: 2.8 mV
Lead Channel Setting Pacing Amplitude: 2 V
Lead Channel Setting Pacing Amplitude: 2.5 V
Lead Channel Setting Pacing Pulse Width: 0.4 ms
Lead Channel Setting Sensing Sensitivity: 2.8 mV

## 2018-10-10 NOTE — Telephone Encounter (Signed)
Left message for patient to remind of missed remote transmission.  

## 2018-10-17 ENCOUNTER — Encounter: Payer: Self-pay | Admitting: Cardiology

## 2018-10-17 NOTE — Progress Notes (Signed)
Remote pacemaker transmission.   

## 2018-12-09 ENCOUNTER — Other Ambulatory Visit: Payer: Self-pay | Admitting: Family Medicine

## 2018-12-10 NOTE — Telephone Encounter (Signed)
Set up Doxy or phone call.

## 2018-12-10 NOTE — Telephone Encounter (Signed)
Sertraline has not been filled in 2 years. OK to send in refill?

## 2018-12-26 ENCOUNTER — Telehealth: Payer: Self-pay | Admitting: Family Medicine

## 2018-12-26 NOTE — Telephone Encounter (Signed)
Patient's son Reather Steller [phone] stated his mother is complaining of back pain.  He would like for the provider to send something over to the facility ([facility] [phone]) allowing them let his mother receive a message ASAP.  The son would like a return call when this has been processed.

## 2018-12-27 NOTE — Telephone Encounter (Signed)
Any way we can do Doxy with her?   We need to get more info.

## 2018-12-27 NOTE — Telephone Encounter (Signed)
Please see message. °

## 2018-12-27 NOTE — Telephone Encounter (Signed)
Called patients son Al and scheduled a Doxy on Monday at 3:15pm.

## 2018-12-30 ENCOUNTER — Other Ambulatory Visit: Payer: Self-pay

## 2018-12-30 ENCOUNTER — Telehealth (INDEPENDENT_AMBULATORY_CARE_PROVIDER_SITE_OTHER): Payer: Medicare Other | Admitting: Family Medicine

## 2018-12-30 DIAGNOSIS — M549 Dorsalgia, unspecified: Secondary | ICD-10-CM

## 2018-12-30 DIAGNOSIS — R600 Localized edema: Secondary | ICD-10-CM

## 2018-12-30 DIAGNOSIS — I10 Essential (primary) hypertension: Secondary | ICD-10-CM

## 2018-12-30 NOTE — Progress Notes (Signed)
This visit type was conducted due to national recommendations for restrictions regarding the COVID-19 pandemic in an effort to limit this patient's exposure and mitigate transmission in our community.   Virtual Visit via Video Note  I connected with Imogen Magill on 12/30/18 at  3:15 PM EDT by a video enabled telemedicine application and verified that I am speaking with the correct person using two identifiers.  Location patient: home Location provider:work or home office Persons participating in the virtual visit: patient, provider, and her son Sheppard Coil.  Patient was very hard of hearing and we required assistance of her son for communication.  I discussed the limitations of evaluation and management by telemedicine and the availability of in person appointments. The patient expressed understanding and agreed to proceed.   HPI: Patient has chronic problems including history of obesity, hypertension, history of second-degree Wenckebach AV block, cognitive impairment, history of atrial fibrillation, history of CAD.  She currently resides at [facility].  Because of current pandemic family has been unable to visit her inside.  We had gotten a call last week about her having some back pain.  It was not clear what was being asked from the message.  After discussing with her son today they indicate that his sister is a massage therapist and Ms. Dunford has benefited greatly from massage in the past.  We are asking specifically that we write a letter for her (patient's daughter) to be allowed to come into [facility] to perform her such therapy on her upper back and neck area where she is had a lot of pain recently.  They relates she is had some recent mild leg and foot edema.  She complains of dyspnea with activity.  No chest pain.  No cough.  No fever.  She is not being weighed.   ROS: See pertinent positives and negatives per HPI.  Past Medical History:  Diagnosis Date  . Allergy   .  Anemia   . Arthritis    "in my fingers" (07/08/2013)  . CAD (coronary artery disease)    a. 05/2013 nonobs dzs by cath.  . Colon polyps   . Groin hematoma    a. 05/2013 R groin hematoma post-cath - u/s 05/31/13 large 7.49 cm r inguinal hematoma extending into thigh, no psa or avf.  . H/O hiatal hernia   . Heart block AV second degree    a. s/p MDT Addapta dual chamber pacemaker 05/2013 by Dr Rayann Heman.  . Hypertension   . On home oxygen therapy    "2L maybe 12h/day" (07/08/2013)  . Pacemaker   . Pacemaker   . PAF (paroxysmal atrial fibrillation) ([place])    a. Found post-pacer 05/2013->Xarelto added;  b. 05/2013 Echo: EF 60-65%, mild LVH, nl wall motion w/o rwma.    Past Surgical History:  Procedure Laterality Date  . BREAST CYST EXCISION Right 1959; 1970's  . BUNIONECTOMY Bilateral 1967  . CARDIAC CATHETERIZATION    . CATARACT EXTRACTION W/ INTRAOCULAR LENS  IMPLANT, BILATERAL Bilateral   . DILATION AND CURETTAGE OF UTERUS    . INSERT / REPLACE / REMOVE PACEMAKER  05/2013   MDT ADDRL1 implanted by Dr Rayann Heman for heart block  . INSERT / REPLACE / REMOVE PACEMAKER  06/2013   Right ventricular lead perforation with tamponade  . LEAD REVISION N/A 06/13/2013   Procedure: LEAD REVISION;  Surgeon: Coralyn Mark, MD;  Location: [place] CATH LAB;  Service: Cardiovascular;  Laterality: N/A;  . LEFT HEART CATHETERIZATION WITH CORONARY ANGIOGRAM  N/A 05/27/2013   Procedure: LEFT HEART CATHETERIZATION WITH CORONARY ANGIOGRAM;  Surgeon: Birdie Riddle, MD;  Location: [place] CATH LAB;  Service: Cardiovascular;  Laterality: N/A;  . PERICARDIAL TAP N/A 06/11/2013   Procedure: PERICARDIAL TAP;  Surgeon: Birdie Riddle, MD;  Location: [place] CATH LAB;  Service: Cardiovascular;  Laterality: N/A;  . PERMANENT PACEMAKER INSERTION N/A 05/27/2013   Procedure: PERMANENT PACEMAKER INSERTION;  Surgeon: Coralyn Mark, MD;  Location: [place] CATH LAB;  Service: Cardiovascular;  Laterality: N/A;  . TEMPORARY PACEMAKER INSERTION N/A 05/27/2013    Procedure: TEMPORARY PACEMAKER INSERTION;  Surgeon: Birdie Riddle, MD;  Location: [place] CATH LAB;  Service: Cardiovascular;  Laterality: N/A;  . VARICOSE VEIN SURGERY Bilateral ~ 2002    Family History  Problem Relation Age of Onset  . Cancer Father        lung    SOCIAL HX: Lives at [facility].  She is widowed.   Current Outpatient Medications:  .  acetaminophen (TYLENOL) 325 MG tablet, Take 325 mg by mouth every 6 (six) hours as needed for mild pain., Disp: , Rfl:  .  amLODipine (NORVASC) 5 MG tablet, TAKE 1 TABLET BY MOUTH DAILY. PLEASE SCHEDULE A PHYSICAL FOR MORE REFILLS., Disp: 90 tablet, Rfl: 0 .  beta carotene w/minerals (OCUVITE) tablet, Take 1 tablet by mouth daily., Disp: , Rfl:  .  dextromethorphan-guaiFENesin (MUCINEX DM) 30-600 MG 12hr tablet, Take 1 tablet by mouth 2 (two) times daily as needed for cough., Disp: , Rfl:  .  donepezil (ARICEPT) 5 MG tablet, TAKE 1 TABLET(5 MG) BY MOUTH AT BEDTIME, Disp: 30 tablet, Rfl: 1 .  loratadine (CLARITIN) 10 MG tablet, Take 10 mg by mouth daily as needed for allergies. , Disp: , Rfl:  .  sertraline (ZOLOFT) 50 MG tablet, TAKE 1 TABLET(50 MG) BY MOUTH DAILY, Disp: 90 tablet, Rfl: 0 .  TURMERIC PO, Take 1 capsule by mouth daily. , Disp: , Rfl:   EXAM:  VITALS per patient if applicable:  GENERAL: alert, oriented, appears well and in no acute distress  HEENT: atraumatic, conjunttiva clear, no obvious abnormalities on inspection of external nose and ears  NECK: normal movements of the head and neck  LUNGS: on inspection no signs of respiratory distress, breathing rate appears normal, no obvious gross SOB, gasping or wheezing  CV: no obvious cyanosis  MS: moves all visible extremities without noticeable abnormality  PSYCH/NEURO: pleasant and cooperative, no obvious depression or anxiety, speech and thought processing grossly intact  ASSESSMENT AND PLAN:  Discussed the following assessment and plan:  #1 history of  hypertension currently treated with amlodipine  #2 reports of some recent pedal edema.  Possibly exacerbated by amlodipine. -recommend in office assessment for any progressive edema or dyspnea.   #3 back pain.  Family requesting letter of permission to have massage therapist going to [facility] once a week for treatment.     I discussed the assessment and treatment plan with the patient. The patient was provided an opportunity to ask questions and all were answered. The patient agreed with the plan and demonstrated an understanding of the instructions.   The patient was advised to call back or seek an in-person evaluation if the symptoms worsen or if the condition fails to improve as anticipated.   Carolann Littler, MD

## 2018-12-31 ENCOUNTER — Telehealth: Payer: Self-pay

## 2018-12-31 NOTE — Telephone Encounter (Signed)
Called [facility] at [phone] and spoke with Rodrigo Ran who stated that patient lives in the independent Living building and the fax number to them is [phone]. I have faxed the letter for approval for patients daughter to give massage therapy to patient for her back pain.   Called son Al and left a detailed voice message to let him know that I have faxed over the order for massage therapy for his mother to [facility].

## 2019-01-08 ENCOUNTER — Ambulatory Visit (INDEPENDENT_AMBULATORY_CARE_PROVIDER_SITE_OTHER): Payer: Medicare Other | Admitting: *Deleted

## 2019-01-08 DIAGNOSIS — I442 Atrioventricular block, complete: Secondary | ICD-10-CM | POA: Diagnosis not present

## 2019-01-08 DIAGNOSIS — I480 Paroxysmal atrial fibrillation: Secondary | ICD-10-CM

## 2019-01-09 LAB — CUP PACEART REMOTE DEVICE CHECK
Battery Impedance: 540 Ohm
Battery Remaining Longevity: 78 mo
Battery Voltage: 2.79 V
Brady Statistic AP VP Percent: 1 %
Brady Statistic AP VS Percent: 0 %
Brady Statistic AS VP Percent: 98 %
Brady Statistic AS VS Percent: 2 %
Date Time Interrogation Session: 20200902200933
Implantable Lead Implant Date: 20150120
Implantable Lead Implant Date: 20150120
Implantable Lead Location: 753859
Implantable Lead Location: 753860
Implantable Lead Model: 5076
Implantable Lead Model: 5076
Implantable Pulse Generator Implant Date: 20150120
Lead Channel Impedance Value: 494 Ohm
Lead Channel Impedance Value: 502 Ohm
Lead Channel Pacing Threshold Amplitude: 0.5 V
Lead Channel Pacing Threshold Amplitude: 1 V
Lead Channel Pacing Threshold Pulse Width: 0.4 ms
Lead Channel Pacing Threshold Pulse Width: 0.4 ms
Lead Channel Setting Pacing Amplitude: 2 V
Lead Channel Setting Pacing Amplitude: 2.5 V
Lead Channel Setting Pacing Pulse Width: 0.4 ms
Lead Channel Setting Sensing Sensitivity: 2.8 mV

## 2019-01-22 ENCOUNTER — Telehealth: Payer: Self-pay | Admitting: Family Medicine

## 2019-01-22 NOTE — Telephone Encounter (Signed)
Theresa Norton with Hospice states pt wants to enroll in their palliative program. Theresa Norton wants to know is that ok with dr Elease Hashimoto? Cb:  [id number]

## 2019-01-22 NOTE — Telephone Encounter (Signed)
Called patient and LMOVM to return call  [place] for [facility] to Discuss results / PCP / recommendations / Schedule patient  Left a detailed message for Manus Gunning with the verbal OK per Dr. Elease Hashimoto for the palliative program enrollment.  CRM Created in case Manus Gunning needs more info

## 2019-01-22 NOTE — Telephone Encounter (Signed)
Please see message. °

## 2019-01-22 NOTE — Telephone Encounter (Signed)
That is OK

## 2019-01-24 NOTE — Progress Notes (Signed)
Remote pacemaker transmission.   

## 2019-02-10 ENCOUNTER — Other Ambulatory Visit: Payer: Self-pay | Admitting: Family Medicine

## 2019-03-08 ENCOUNTER — Other Ambulatory Visit: Payer: Self-pay | Admitting: Family Medicine

## 2019-04-02 ENCOUNTER — Other Ambulatory Visit: Payer: Self-pay

## 2019-04-06 ENCOUNTER — Other Ambulatory Visit: Payer: Self-pay | Admitting: Family Medicine

## 2019-04-09 ENCOUNTER — Ambulatory Visit (INDEPENDENT_AMBULATORY_CARE_PROVIDER_SITE_OTHER): Payer: Medicare Other | Admitting: *Deleted

## 2019-04-09 DIAGNOSIS — I442 Atrioventricular block, complete: Secondary | ICD-10-CM

## 2019-04-09 LAB — CUP PACEART REMOTE DEVICE CHECK
Battery Impedance: 615 Ohm
Battery Remaining Longevity: 74 mo
Battery Voltage: 2.79 V
Brady Statistic AP VP Percent: 1 %
Brady Statistic AP VS Percent: 0 %
Brady Statistic AS VP Percent: 98 %
Brady Statistic AS VS Percent: 2 %
Date Time Interrogation Session: 20201202153403
Implantable Lead Implant Date: 20150120
Implantable Lead Implant Date: 20150120
Implantable Lead Location: 753859
Implantable Lead Location: 753860
Implantable Lead Model: 5076
Implantable Lead Model: 5076
Implantable Pulse Generator Implant Date: 20150120
Lead Channel Impedance Value: 499 Ohm
Lead Channel Impedance Value: 502 Ohm
Lead Channel Pacing Threshold Amplitude: 0.5 V
Lead Channel Pacing Threshold Amplitude: 1 V
Lead Channel Pacing Threshold Pulse Width: 0.4 ms
Lead Channel Pacing Threshold Pulse Width: 0.4 ms
Lead Channel Setting Pacing Amplitude: 2 V
Lead Channel Setting Pacing Amplitude: 2.5 V
Lead Channel Setting Pacing Pulse Width: 0.4 ms
Lead Channel Setting Sensing Sensitivity: 2.8 mV

## 2019-05-26 DIAGNOSIS — Z23 Encounter for immunization: Secondary | ICD-10-CM | POA: Diagnosis not present

## 2019-06-05 ENCOUNTER — Other Ambulatory Visit: Payer: Self-pay | Admitting: Family Medicine

## 2019-06-11 ENCOUNTER — Ambulatory Visit (INDEPENDENT_AMBULATORY_CARE_PROVIDER_SITE_OTHER): Payer: Medicare Other

## 2019-06-11 VITALS — Ht 61.0 in | Wt 170.0 lb

## 2019-06-11 DIAGNOSIS — Z0000 Encounter for general adult medical examination without abnormal findings: Secondary | ICD-10-CM

## 2019-06-11 NOTE — Progress Notes (Signed)
This visit is being conducted via phone call due to the COVID-19 pandemic. This patient has given me verbal consent via phone to conduct this visit, patient states they are participating from their home address. Some vital signs may be absent or patient reported.   Patient identification: identified by name, DOB, and current address.  Location provider: [facility], Office Persons participating in the telephone visit: Ms. Sally Decena, son Al, and Franne Forts, LPN. This was a 3 way call.   Subjective:   Theresa Norton is a 86 y.o. female who presents for Medicare Annual (Subsequent) preventive examination.  Today's telephone visit was conducted by a 3 way call and was quite difficult due to a language barrier and hearing difficulty. The patient complains only of not breathing well; which is chronic per son's report. She resides at [facility] with a walker throughout the facility.  Review of Systems:  No ROS; Annual Medicare Wellness Subsequent visit Cardiac Risk Factors include: advanced age (>17men, >53 women);hypertension;obesity (BMI >30kg/m2);sedentary lifestyle     Objective:     Vitals: Ht 5\' 1"  (1.549 m)   Wt 170 lb (77.1 kg) Comment: estimated  BMI 32.12 kg/m   Body mass index is 32.12 kg/m.  Advanced Directives 06/11/2019 02/13/2018 01/23/2017 02/12/2016 07/08/2013 06/12/2013 05/26/2013  Does Patient Have a Medical Advance Directive? Yes Yes Yes No Patient has advance directive, copy not in chart Patient has advance directive, copy not in chart Patient has advance directive, copy not in chart  Type of Advance Directive [place];Living will - - - - Living will Living will  Does patient want to make changes to medical advance directive? No - Patient declined - - - No change requested No No  Copy of Healthcare Power of Attorney in Chart? No - copy requested - - - Copy requested from other (Comment) Copy requested from family Copy  requested from family  Would patient like information on creating a medical advance directive? - - - No - patient declined information - - -  Pre-existing out of facility DNR order (yellow form or pink MOST form) - - - - - No -    Tobacco Social History   Tobacco Use  Smoking Status Never Smoker  Smokeless Tobacco Never Used     Counseling given: Not Answered   Clinical Intake:  Pre-visit preparation completed: Yes  Pain : No/denies pain     BMI - recorded: 32.12 Nutritional Status: BMI > 30  Obese Diabetes: No  How often do you need to have someone help you when you read instructions, pamphlets, or other written materials from your doctor or pharmacy?: 3 - Sometimes  Interpreter Needed?: (occasionally and son helped today)  Information entered by :: Franne Forts, LPN.  Past Medical History:  Diagnosis Date  . Allergy   . Anemia   . Arthritis    "in my fingers" (07/08/2013)  . CAD (coronary artery disease)    a. 05/2013 nonobs dzs by cath.  . Colon polyps   . Groin hematoma    a. 05/2013 R groin hematoma post-cath - u/s 05/31/13 large 7.49 cm r inguinal hematoma extending into thigh, no psa or avf.  . H/O hiatal hernia   . Heart block AV second degree    a. s/p MDT Addapta dual chamber pacemaker 05/2013 by Dr Rayann Heman.  . Hypertension   . On home oxygen therapy    "2L maybe 12h/day" (07/08/2013)  . Pacemaker   . Pacemaker   .  PAF (paroxysmal atrial fibrillation) ([place])    a. Found post-pacer 05/2013->Xarelto added;  b. 05/2013 Echo: EF 60-65%, mild LVH, nl wall motion w/o rwma.   Past Surgical History:  Procedure Laterality Date  . BREAST CYST EXCISION Right 1959; 1970's  . BUNIONECTOMY Bilateral 1967  . CARDIAC CATHETERIZATION    . CATARACT EXTRACTION W/ INTRAOCULAR LENS  IMPLANT, BILATERAL Bilateral   . DILATION AND CURETTAGE OF UTERUS    . INSERT / REPLACE / REMOVE PACEMAKER  05/2013   MDT [id number] implanted by Dr Rayann Heman for heart block  . INSERT / REPLACE /  REMOVE PACEMAKER  06/2013   Right ventricular lead perforation with tamponade  . LEAD REVISION N/A 06/13/2013   Procedure: LEAD REVISION;  Surgeon: Coralyn Mark, MD;  Location: [facility];  Service: Cardiovascular;  Laterality: N/A;  . LEFT HEART CATHETERIZATION WITH CORONARY ANGIOGRAM N/A 05/27/2013   Procedure: LEFT HEART CATHETERIZATION WITH CORONARY ANGIOGRAM;  Surgeon: Birdie Riddle, MD;  Location: [facility];  Service: Cardiovascular;  Laterality: N/A;  . PERICARDIAL TAP N/A 06/11/2013   Procedure: PERICARDIAL TAP;  Surgeon: Birdie Riddle, MD;  Location: [facility];  Service: Cardiovascular;  Laterality: N/A;  . PERMANENT PACEMAKER INSERTION N/A 05/27/2013   Procedure: PERMANENT PACEMAKER INSERTION;  Surgeon: Coralyn Mark, MD;  Location: [facility];  Service: Cardiovascular;  Laterality: N/A;  . TEMPORARY PACEMAKER INSERTION N/A 05/27/2013   Procedure: TEMPORARY PACEMAKER INSERTION;  Surgeon: Birdie Riddle, MD;  Location: [facility];  Service: Cardiovascular;  Laterality: N/A;  . VARICOSE VEIN SURGERY Bilateral ~ 2002   Family History  Problem Relation Age of Onset  . Cancer Father        lung   Social History   Socioeconomic History  . Marital status: Married    Spouse name: Not on file  . Number of children: Not on file  . Years of education: Not on file  . Highest education level: Not on file  Occupational History  . Not on file  Tobacco Use  . Smoking status: Never Smoker  . Smokeless tobacco: Never Used  Substance and Sexual Activity  . Alcohol use: Yes    Alcohol/week: 14.0 standard drinks    Types: 7 Glasses of wine, 7 Cans of beer per week    Comment: 2019: wine in the evening   . Drug use: No  . Sexual activity: Never  Other Topics Concern  . Not on file  Social History Narrative   Living at [facility] Strain: Low Risk   . Difficulty of Paying Living Expenses: Not hard at all  Food Insecurity:  No Food Insecurity  . Worried About Charity fundraiser in the Last Year: Never true  . Ran Out of Food in the Last Year: Never true  Transportation Needs: No Transportation Needs  . Lack of Transportation (Medical): No  . Lack of Transportation (Non-Medical): No  Physical Activity: Insufficiently Active  . Days of Exercise per Week: 7 days  . Minutes of Exercise per Session: 10 min  Stress: No Stress Concern Present  . Feeling of Stress : Only a little  Social Connections: Unknown  . Frequency of Communication with Friends and Family: Not on file  . Frequency of Social Gatherings with Friends and Family: Twice a week  . Attends Religious Services: Not on file  . Active Member of Clubs or Organizations: Not on file  .  Attends Archivist Meetings: Not on file  . Marital Status: Not on file    Outpatient Encounter Medications as of 06/11/2019  Medication Sig  . acetaminophen (TYLENOL) 325 MG tablet Take 325 mg by mouth every 6 (six) hours as needed for mild pain.  Marland Kitchen amLODipine (NORVASC) 5 MG tablet TAKE 1 TABLET BY MOUTH EVERY DAY  . beta carotene w/minerals (OCUVITE) tablet Take 1 tablet by mouth daily.  Marland Kitchen dextromethorphan-guaiFENesin (MUCINEX DM) 30-600 MG 12hr tablet Take 1 tablet by mouth 2 (two) times daily as needed for cough.  . donepezil (ARICEPT) 5 MG tablet TAKE 1 TABLET(5 MG) BY MOUTH AT BEDTIME  . loratadine (CLARITIN) 10 MG tablet Take 10 mg by mouth daily as needed for allergies.   Marland Kitchen sertraline (ZOLOFT) 50 MG tablet TAKE 1 TABLET(50 MG) BY MOUTH DAILY  . TURMERIC PO Take 1 capsule by mouth daily.    No facility-administered encounter medications on file as of 06/11/2019.    Activities of Daily Living In your present state of health, do you have any difficulty performing the following activities: 06/11/2019  Hearing? Y  Vision? N  Difficulty concentrating or making decisions? N  Walking or climbing stairs? Y  Comment wears oxygen  Dressing or bathing? N  Doing  errands, shopping? Y  Preparing Food and eating ? N  Using the Toilet? N  In the past six months, have you accidently leaked urine? N  Do you have problems with loss of bowel control? N  Managing your Medications? N  Managing your Finances? N  Housekeeping or managing your Housekeeping? Y  Comment resides at [facility] recent data might be hidden    Patient Care Team: Eulas Post, MD as PCP - General (Family Medicine)    Assessment:   This is a routine wellness examination for Jazirah.  Exercise Activities and Dietary recommendations Current Exercise Habits: The patient does not participate in regular exercise at present(she does walk throughout the facility every day), Exercise limited by: respiratory conditions(s);Other - see comments(wears oxygen)  Goals    . DIET - INCREASE WATER INTAKE    . exercise 150 minutes every week       Fall Risk Fall Risk  06/11/2019 04/02/2019 03/11/2018 01/23/2017 04/13/2016  Falls in the past year? 0 0 0 Yes No  Comment - Emmi Telephone Survey: data to providers prior to load - - Emmi Telephone Survey: data to providers prior to load  Number falls in past yr: - - - - -  Injury with Fall? - - - Yes -  Risk for fall due to : Medication side effect;Other (Comment) - - Impaired mobility -  Risk for fall due to: Comment oxygen patient - - - -  Follow up Falls evaluation completed;Education provided;Falls prevention discussed - - - -   Is the patient's home free of loose throw rugs in walkways, pet beds, electrical cords, etc?   yes      Grab bars in the bathroom? yes      Handrails on the stairs?   yes      Adequate lighting?   yes  Timed Get Up and Go performed: N/A due to telephone visit  Depression Screen PHQ 2/9 Scores 06/11/2019 03/11/2018 01/23/2017 04/22/2015  PHQ - 2 Score 0 1 0 0     Cognitive Function MMSE - Mini Mental State Exam 01/23/2017  Orientation to time 3  Orientation to Place 5  Registration 3  Attention/  Calculation  5  Recall 3  Language- name 2 objects 2  Language- repeat 1  Language- follow 3 step command 3  Language- read & follow direction 1  Write a sentence 1  Copy design 1  Total score 28        Immunization History  Administered Date(s) Administered  . Td 04/10/1997    Qualifies for Shingles Vaccine?yes, but she declines immunizations  Screening Tests Health Maintenance  Topic Date Due  . DEXA SCAN  09/06/1995  . PNA vac Low Risk Adult (1 of 2 - PCV13) 09/06/1995  . TETANUS/TDAP  04/11/2007  . INFLUENZA VACCINE  12/07/2018    Cancer Screenings: Lung: Low Dose CT Chest recommended if Age 45-80 years, 30 pack-year currently smoking OR have quit w/in 15years. Patient does not qualify. Breast:  Up to date on Mammogram? Yes, patient advanced age now Up to date of Bone Density/Dexa? Yes, patient advanced age now. Colorectal: yes  Additional Screenings:  Hepatitis C Screening: N/A due to age.     Plan:   Ms. Freundlich declines immunizations and due to advanced age has completed requirements for colonoscopy, mammogram, and bone density testing. Patient reports that her meals are prepared for her and she doesn't eat much at breakfast but has lunch and dinner every day. Recommendation for next wellness visit to be in office (in person) due to language barrier and her hearing deficit.    I have personally reviewed and noted the following in the patient's chart:   . Medical and social history . Use of alcohol, tobacco or illicit drugs  . Current medications and supplements . Functional ability and status . Nutritional status . Physical activity . Advanced directives . List of other physicians . Hospitalizations, surgeries, and ER visits in previous 12 months . Vitals . Screenings to include cognitive, depression, and falls . Referrals and appointments  In addition, I have reviewed and discussed with patient certain preventive protocols, quality metrics, and best  practice recommendations. A written personalized care plan for preventive services as well as general preventive health recommendations were provided to patient.     Franne Forts, LPN  [phone]

## 2019-06-11 NOTE — Patient Instructions (Addendum)
Theresa Norton , Thank you for taking time to participate in your Medicare Wellness Visit. I appreciate your ongoing commitment to your health goals. Please review the following plan we discussed and let me know if I can assist you in the future.   Screening recommendations/referrals: Colorectal Screening: no longer necessary due to advanced age. Mammogram: completed 05/12/2007 and was negative; now advanced age. Bone Density: no documentation in chart; now advanced age.  Vision and Dental Exams: Recommended annual ophthalmology exams for early detection of glaucoma and other disorders of the eye. Patient has not had a routine eye exam in the last year. Recommended annual dental exams for proper oral hygiene. Patient has not seen a dentist because she has upper and lower dentures.  Diabetic Exams: Diabetic Eye Exam: N/A Diabetic Foot Exam: N/A  Vaccinations: Influenza vaccine: declines immunizations Pneumococcal vaccine:declines immunizations  Tdap vaccine: declines immunizations Shingles vaccine: declines immunizations  Advanced directives: Advance directives discussed with you today. Please bring a copy of your POA (Power of [place]) and/or Living Will to your next appointment.  Goals: Recommend to drink at least 6-8 8oz glasses of water per day.  Recommend to exercise for at least 150 minutes per week.  Recommend to remove any items from the home that may cause slips or trips.  Next appointment: Please schedule your Annual Wellness Visit with your Nurse Health Advisor in one year.  Preventive Care 83 Years and Older, Female Preventive care refers to lifestyle choices and visits with your health care provider that can promote health and wellness. What does preventive care include?  A yearly physical exam. This is also called an annual well check.  Dental exams once or twice a year.  Routine eye exams. Ask your health care provider how often you should have your eyes  checked.  Personal lifestyle choices, including:  Daily care of your teeth and gums.  Regular physical activity.  Eating a healthy diet.  Avoiding tobacco and drug use.  Limiting alcohol use.  Practicing safe sex.  Taking low-dose aspirin every day if recommended by your health care provider.  Taking vitamin and mineral supplements as recommended by your health care provider. What happens during an annual well check? The services and screenings done by your health care provider during your annual well check will depend on your age, overall health, lifestyle risk factors, and family history of disease. Counseling  Your health care provider may ask you questions about your:  Alcohol use.  Tobacco use.  Drug use.  Emotional well-being.  Home and relationship well-being.  Sexual activity.  Eating habits.  History of falls.  Memory and ability to understand (cognition).  Work and work Statistician.  Reproductive health. Screening  You may have the following tests or measurements:  Height, weight, and BMI.  Blood pressure.  Lipid and cholesterol levels. These may be checked every 5 years, or more frequently if you are over 26 years old.  Skin check.  Lung cancer screening. You may have this screening every year starting at age 96 if you have a 30-pack-year history of smoking and currently smoke or have quit within the past 15 years.  Fecal occult blood test (FOBT) of the stool. You may have this test every year starting at age 87.  Flexible sigmoidoscopy or colonoscopy. You may have a sigmoidoscopy every 5 years or a colonoscopy every 10 years starting at age 71.  Hepatitis C blood test.  Hepatitis B blood test.  Sexually transmitted disease (STD) testing.  Diabetes screening. This is done by checking your blood sugar (glucose) after you have not eaten for a while (fasting). You may have this done every 1-3 years.  Bone density scan. This is done to  screen for osteoporosis. You may have this done starting at age 45.  Mammogram. This may be done every 1-2 years. Talk to your health care provider about how often you should have regular mammograms. Talk with your health care provider about your test results, treatment options, and if necessary, the need for more tests. Vaccines  Your health care provider may recommend certain vaccines, such as:  Influenza vaccine. This is recommended every year.  Tetanus, diphtheria, and acellular pertussis (Tdap, Td) vaccine. You may need a Td booster every 10 years.  Zoster vaccine. You may need this after age 43.  Pneumococcal 13-valent conjugate (PCV13) vaccine. One dose is recommended after age 49.  Pneumococcal polysaccharide (PPSV23) vaccine. One dose is recommended after age 31. Talk to your health care provider about which screenings and vaccines you need and how often you need them. This information is not intended to replace advice given to you by your health care provider. Make sure you discuss any questions you have with your health care provider. Document Released: 05/21/2015 Document Revised: 01/12/2016 Document Reviewed: 02/23/2015 Elsevier Interactive Patient Education  2017 [place] Prevention in the Home Falls can cause injuries. They can happen to people of all ages. There are many things you can do to make your home safe and to help prevent falls. What can I do on the outside of my home?  Regularly fix the edges of walkways and driveways and fix any cracks.  Remove anything that might make you trip as you walk through a door, such as a raised step or threshold.  Trim any bushes or trees on the path to your home.  Use bright outdoor lighting.  Clear any walking paths of anything that might make someone trip, such as rocks or tools.  Regularly check to see if handrails are loose or broken. Make sure that both sides of any steps have handrails.  Any raised decks  and porches should have guardrails on the edges.  Have any leaves, snow, or ice cleared regularly.  Use sand or salt on walking paths during winter.  Clean up any spills in your garage right away. This includes oil or grease spills. What can I do in the bathroom?  Use night lights.  Install grab bars by the toilet and in the tub and shower. Do not use towel bars as grab bars.  Use non-skid mats or decals in the tub or shower.  If you need to sit down in the shower, use a plastic, non-slip stool.  Keep the floor dry. Clean up any water that spills on the floor as soon as it happens.  Remove soap buildup in the tub or shower regularly.  Attach bath mats securely with double-sided non-slip rug tape.  Do not have throw rugs and other things on the floor that can make you trip. What can I do in the bedroom?  Use night lights.  Make sure that you have a light by your bed that is easy to reach.  Do not use any sheets or blankets that are too big for your bed. They should not hang down onto the floor.  Have a firm chair that has side arms. You can use this for support while you get dressed.  Do not have throw  rugs and other things on the floor that can make you trip. What can I do in the kitchen?  Clean up any spills right away.  Avoid walking on wet floors.  Keep items that you use a lot in easy-to-reach places.  If you need to reach something above you, use a strong step stool that has a grab bar.  Keep electrical cords out of the way.  Do not use floor polish or wax that makes floors slippery. If you must use wax, use non-skid floor wax.  Do not have throw rugs and other things on the floor that can make you trip. What can I do with my stairs?  Do not leave any items on the stairs.  Make sure that there are handrails on both sides of the stairs and use them. Fix handrails that are broken or loose. Make sure that handrails are as long as the stairways.  Check any  carpeting to make sure that it is firmly attached to the stairs. Fix any carpet that is loose or worn.  Avoid having throw rugs at the top or bottom of the stairs. If you do have throw rugs, attach them to the floor with carpet tape.  Make sure that you have a light switch at the top of the stairs and the bottom of the stairs. If you do not have them, ask someone to add them for you. What else can I do to help prevent falls?  Wear shoes that:  Do not have high heels.  Have rubber bottoms.  Are comfortable and fit you well.  Are closed at the toe. Do not wear sandals.  If you use a stepladder:  Make sure that it is fully opened. Do not climb a closed stepladder.  Make sure that both sides of the stepladder are locked into place.  Ask someone to hold it for you, if possible.  Clearly mark and make sure that you can see:  Any grab bars or handrails.  First and last steps.  Where the edge of each step is.  Use tools that help you move around (mobility aids) if they are needed. These include:  Canes.  Walkers.  Scooters.  Crutches.  Turn on the lights when you go into a dark area. Replace any light bulbs as soon as they burn out.  Set up your furniture so you have a clear path. Avoid moving your furniture around.  If any of your floors are uneven, fix them.  If there are any pets around you, be aware of where they are.  Review your medicines with your doctor. Some medicines can make you feel dizzy. This can increase your chance of falling. Ask your doctor what other things that you can do to help prevent falls. This information is not intended to replace advice given to you by your health care provider. Make sure you discuss any questions you have with your health care provider. Document Released: 02/18/2009 Document Revised: 09/30/2015 Document Reviewed: 05/29/2014 Elsevier Interactive Patient Education  2017 [employer].

## 2019-06-23 DIAGNOSIS — Z23 Encounter for immunization: Secondary | ICD-10-CM | POA: Diagnosis not present

## 2019-07-11 ENCOUNTER — Ambulatory Visit (INDEPENDENT_AMBULATORY_CARE_PROVIDER_SITE_OTHER): Payer: Medicare Other | Admitting: *Deleted

## 2019-07-11 DIAGNOSIS — I442 Atrioventricular block, complete: Secondary | ICD-10-CM | POA: Diagnosis not present

## 2019-07-11 LAB — CUP PACEART REMOTE DEVICE CHECK
Battery Impedance: 639 Ohm
Battery Remaining Longevity: 73 mo
Battery Voltage: 2.79 V
Brady Statistic AP VP Percent: 1 %
Brady Statistic AP VS Percent: 0 %
Brady Statistic AS VP Percent: 98 %
Brady Statistic AS VS Percent: 1 %
Date Time Interrogation Session: 20210305160439
Implantable Lead Implant Date: 20150120
Implantable Lead Implant Date: 20150120
Implantable Lead Location: 753859
Implantable Lead Location: 753860
Implantable Lead Model: 5076
Implantable Lead Model: 5076
Implantable Pulse Generator Implant Date: 20150120
Lead Channel Impedance Value: 508 Ohm
Lead Channel Impedance Value: 517 Ohm
Lead Channel Pacing Threshold Amplitude: 0.5 V
Lead Channel Pacing Threshold Amplitude: 1 V
Lead Channel Pacing Threshold Pulse Width: 0.4 ms
Lead Channel Pacing Threshold Pulse Width: 0.4 ms
Lead Channel Setting Pacing Amplitude: 2 V
Lead Channel Setting Pacing Amplitude: 2.5 V
Lead Channel Setting Pacing Pulse Width: 0.4 ms
Lead Channel Setting Sensing Sensitivity: 2.8 mV

## 2019-07-12 NOTE — Progress Notes (Signed)
PPM Remote  

## 2019-08-20 DIAGNOSIS — Z1159 Encounter for screening for other viral diseases: Secondary | ICD-10-CM | POA: Diagnosis not present

## 2019-08-20 DIAGNOSIS — Z20828 Contact with and (suspected) exposure to other viral communicable diseases: Secondary | ICD-10-CM | POA: Diagnosis not present

## 2019-08-27 DIAGNOSIS — Z1159 Encounter for screening for other viral diseases: Secondary | ICD-10-CM | POA: Diagnosis not present

## 2019-08-27 DIAGNOSIS — Z20828 Contact with and (suspected) exposure to other viral communicable diseases: Secondary | ICD-10-CM | POA: Diagnosis not present

## 2019-09-03 DIAGNOSIS — Z1159 Encounter for screening for other viral diseases: Secondary | ICD-10-CM | POA: Diagnosis not present

## 2019-09-03 DIAGNOSIS — Z20828 Contact with and (suspected) exposure to other viral communicable diseases: Secondary | ICD-10-CM | POA: Diagnosis not present

## 2019-09-10 DIAGNOSIS — Z1159 Encounter for screening for other viral diseases: Secondary | ICD-10-CM | POA: Diagnosis not present

## 2019-09-10 DIAGNOSIS — Z20828 Contact with and (suspected) exposure to other viral communicable diseases: Secondary | ICD-10-CM | POA: Diagnosis not present

## 2019-09-17 DIAGNOSIS — Z1159 Encounter for screening for other viral diseases: Secondary | ICD-10-CM | POA: Diagnosis not present

## 2019-09-17 DIAGNOSIS — Z20828 Contact with and (suspected) exposure to other viral communicable diseases: Secondary | ICD-10-CM | POA: Diagnosis not present

## 2019-09-24 DIAGNOSIS — Z1159 Encounter for screening for other viral diseases: Secondary | ICD-10-CM | POA: Diagnosis not present

## 2019-09-24 DIAGNOSIS — Z20828 Contact with and (suspected) exposure to other viral communicable diseases: Secondary | ICD-10-CM | POA: Diagnosis not present

## 2019-10-08 DIAGNOSIS — Z20828 Contact with and (suspected) exposure to other viral communicable diseases: Secondary | ICD-10-CM | POA: Diagnosis not present

## 2019-10-08 DIAGNOSIS — Z1159 Encounter for screening for other viral diseases: Secondary | ICD-10-CM | POA: Diagnosis not present

## 2019-10-10 ENCOUNTER — Ambulatory Visit (INDEPENDENT_AMBULATORY_CARE_PROVIDER_SITE_OTHER): Payer: Medicare Other | Admitting: *Deleted

## 2019-10-10 ENCOUNTER — Telehealth: Payer: Self-pay

## 2019-10-10 DIAGNOSIS — I442 Atrioventricular block, complete: Secondary | ICD-10-CM | POA: Diagnosis not present

## 2019-10-10 LAB — CUP PACEART REMOTE DEVICE CHECK
Battery Impedance: 714 Ohm
Battery Remaining Longevity: 69 mo
Battery Voltage: 2.79 V
Brady Statistic AP VP Percent: 0 %
Brady Statistic AP VS Percent: 0 %
Brady Statistic AS VP Percent: 98 %
Brady Statistic AS VS Percent: 1 %
Date Time Interrogation Session: 20210604161301
Implantable Lead Implant Date: 20150120
Implantable Lead Implant Date: 20150120
Implantable Lead Location: 753859
Implantable Lead Location: 753860
Implantable Lead Model: 5076
Implantable Lead Model: 5076
Implantable Pulse Generator Implant Date: 20150120
Lead Channel Impedance Value: 505 Ohm
Lead Channel Impedance Value: 510 Ohm
Lead Channel Pacing Threshold Amplitude: 0.375 V
Lead Channel Pacing Threshold Amplitude: 1.125 V
Lead Channel Pacing Threshold Pulse Width: 0.4 ms
Lead Channel Pacing Threshold Pulse Width: 0.4 ms
Lead Channel Setting Pacing Amplitude: 2 V
Lead Channel Setting Pacing Amplitude: 2.5 V
Lead Channel Setting Pacing Pulse Width: 0.4 ms
Lead Channel Setting Sensing Sensitivity: 2.8 mV

## 2019-10-10 NOTE — Telephone Encounter (Signed)
Spoke with son Al to remind of missed remote transmission

## 2019-10-14 NOTE — Progress Notes (Signed)
Remote pacemaker transmission.   

## 2019-10-15 DIAGNOSIS — Z20828 Contact with and (suspected) exposure to other viral communicable diseases: Secondary | ICD-10-CM | POA: Diagnosis not present

## 2019-10-15 DIAGNOSIS — Z1159 Encounter for screening for other viral diseases: Secondary | ICD-10-CM | POA: Diagnosis not present

## 2019-10-20 ENCOUNTER — Other Ambulatory Visit: Payer: Self-pay

## 2019-10-21 ENCOUNTER — Ambulatory Visit (INDEPENDENT_AMBULATORY_CARE_PROVIDER_SITE_OTHER): Payer: Medicare Other | Admitting: Family Medicine

## 2019-10-21 ENCOUNTER — Encounter: Payer: Self-pay | Admitting: Family Medicine

## 2019-10-21 VITALS — BP 122/64 | HR 95 | Temp 97.8°F | Wt 179.2 lb

## 2019-10-21 DIAGNOSIS — L02229 Furuncle of trunk, unspecified: Secondary | ICD-10-CM | POA: Diagnosis not present

## 2019-10-21 MED ORDER — DOXYCYCLINE HYCLATE 100 MG PO CAPS: 100.0000 mg | ORAL_CAPSULE | Freq: Two times a day (BID) | ORAL | 0 refills | Status: AC

## 2019-10-21 NOTE — Progress Notes (Signed)
° °  Subjective:    Patient ID: Theresa Norton, female    DOB: 28-May-1928, 86 y.o.   MRN: [id number]  HPI Here for 3 days of a tender lump on the chest. No fever. She has never had this before.    Review of Systems  Constitutional: Negative.   Respiratory: Negative.   Cardiovascular: Negative.        Objective:   Physical Exam Constitutional:      Appearance: Normal appearance.  Cardiovascular:     Rate and Rhythm: Normal rate and regular rhythm.     Pulses: Normal pulses.     Heart sounds: Normal heart sounds.  Pulmonary:     Effort: Pulmonary effort is normal.     Breath sounds: Normal breath sounds.  Skin:    Comments: There is a 3 cm tender boil on the center of the chest above the sternum   Neurological:     Mental Status: She is alert.           Assessment & Plan:  Boil. This was lanced with a scalpel and a fiar amount of purulent fluid was expressed. Dressed with a Bandaid. Treat with 10 days of Doxycycline. Alysia Penna, MD

## 2019-10-22 DIAGNOSIS — Z1159 Encounter for screening for other viral diseases: Secondary | ICD-10-CM | POA: Diagnosis not present

## 2019-10-22 DIAGNOSIS — Z20828 Contact with and (suspected) exposure to other viral communicable diseases: Secondary | ICD-10-CM | POA: Diagnosis not present

## 2019-10-29 DIAGNOSIS — Z20828 Contact with and (suspected) exposure to other viral communicable diseases: Secondary | ICD-10-CM | POA: Diagnosis not present

## 2019-10-29 DIAGNOSIS — Z1159 Encounter for screening for other viral diseases: Secondary | ICD-10-CM | POA: Diagnosis not present

## 2019-11-05 DIAGNOSIS — Z1159 Encounter for screening for other viral diseases: Secondary | ICD-10-CM | POA: Diagnosis not present

## 2019-11-05 DIAGNOSIS — Z20828 Contact with and (suspected) exposure to other viral communicable diseases: Secondary | ICD-10-CM | POA: Diagnosis not present

## 2019-11-12 DIAGNOSIS — Z1159 Encounter for screening for other viral diseases: Secondary | ICD-10-CM | POA: Diagnosis not present

## 2019-11-12 DIAGNOSIS — Z20828 Contact with and (suspected) exposure to other viral communicable diseases: Secondary | ICD-10-CM | POA: Diagnosis not present

## 2019-11-17 DIAGNOSIS — M6281 Muscle weakness (generalized): Secondary | ICD-10-CM | POA: Diagnosis not present

## 2019-11-17 DIAGNOSIS — R2689 Other abnormalities of gait and mobility: Secondary | ICD-10-CM | POA: Diagnosis not present

## 2019-11-17 DIAGNOSIS — R488 Other symbolic dysfunctions: Secondary | ICD-10-CM | POA: Diagnosis not present

## 2019-11-17 DIAGNOSIS — R296 Repeated falls: Secondary | ICD-10-CM | POA: Diagnosis not present

## 2019-11-19 DIAGNOSIS — Z20828 Contact with and (suspected) exposure to other viral communicable diseases: Secondary | ICD-10-CM | POA: Diagnosis not present

## 2019-11-19 DIAGNOSIS — Z1159 Encounter for screening for other viral diseases: Secondary | ICD-10-CM | POA: Diagnosis not present

## 2019-11-20 DIAGNOSIS — R296 Repeated falls: Secondary | ICD-10-CM | POA: Diagnosis not present

## 2019-11-20 DIAGNOSIS — R488 Other symbolic dysfunctions: Secondary | ICD-10-CM | POA: Diagnosis not present

## 2019-11-20 DIAGNOSIS — M6281 Muscle weakness (generalized): Secondary | ICD-10-CM | POA: Diagnosis not present

## 2019-11-20 DIAGNOSIS — R2689 Other abnormalities of gait and mobility: Secondary | ICD-10-CM | POA: Diagnosis not present

## 2019-11-21 DIAGNOSIS — R488 Other symbolic dysfunctions: Secondary | ICD-10-CM | POA: Diagnosis not present

## 2019-11-21 DIAGNOSIS — M6281 Muscle weakness (generalized): Secondary | ICD-10-CM | POA: Diagnosis not present

## 2019-11-21 DIAGNOSIS — R296 Repeated falls: Secondary | ICD-10-CM | POA: Diagnosis not present

## 2019-11-21 DIAGNOSIS — R2689 Other abnormalities of gait and mobility: Secondary | ICD-10-CM | POA: Diagnosis not present

## 2019-11-22 DIAGNOSIS — R2689 Other abnormalities of gait and mobility: Secondary | ICD-10-CM | POA: Diagnosis not present

## 2019-11-22 DIAGNOSIS — M6281 Muscle weakness (generalized): Secondary | ICD-10-CM | POA: Diagnosis not present

## 2019-11-22 DIAGNOSIS — R296 Repeated falls: Secondary | ICD-10-CM | POA: Diagnosis not present

## 2019-11-22 DIAGNOSIS — R488 Other symbolic dysfunctions: Secondary | ICD-10-CM | POA: Diagnosis not present

## 2019-11-24 DIAGNOSIS — M6281 Muscle weakness (generalized): Secondary | ICD-10-CM | POA: Diagnosis not present

## 2019-11-24 DIAGNOSIS — R296 Repeated falls: Secondary | ICD-10-CM | POA: Diagnosis not present

## 2019-11-24 DIAGNOSIS — R2689 Other abnormalities of gait and mobility: Secondary | ICD-10-CM | POA: Diagnosis not present

## 2019-11-24 DIAGNOSIS — R488 Other symbolic dysfunctions: Secondary | ICD-10-CM | POA: Diagnosis not present

## 2019-11-25 DIAGNOSIS — M6281 Muscle weakness (generalized): Secondary | ICD-10-CM | POA: Diagnosis not present

## 2019-11-25 DIAGNOSIS — R296 Repeated falls: Secondary | ICD-10-CM | POA: Diagnosis not present

## 2019-11-25 DIAGNOSIS — R2689 Other abnormalities of gait and mobility: Secondary | ICD-10-CM | POA: Diagnosis not present

## 2019-11-25 DIAGNOSIS — R488 Other symbolic dysfunctions: Secondary | ICD-10-CM | POA: Diagnosis not present

## 2019-11-26 DIAGNOSIS — Z20828 Contact with and (suspected) exposure to other viral communicable diseases: Secondary | ICD-10-CM | POA: Diagnosis not present

## 2019-11-26 DIAGNOSIS — R2689 Other abnormalities of gait and mobility: Secondary | ICD-10-CM | POA: Diagnosis not present

## 2019-11-26 DIAGNOSIS — R296 Repeated falls: Secondary | ICD-10-CM | POA: Diagnosis not present

## 2019-11-26 DIAGNOSIS — Z1159 Encounter for screening for other viral diseases: Secondary | ICD-10-CM | POA: Diagnosis not present

## 2019-11-26 DIAGNOSIS — R488 Other symbolic dysfunctions: Secondary | ICD-10-CM | POA: Diagnosis not present

## 2019-11-26 DIAGNOSIS — M6281 Muscle weakness (generalized): Secondary | ICD-10-CM | POA: Diagnosis not present

## 2019-11-27 DIAGNOSIS — R488 Other symbolic dysfunctions: Secondary | ICD-10-CM | POA: Diagnosis not present

## 2019-11-27 DIAGNOSIS — M6281 Muscle weakness (generalized): Secondary | ICD-10-CM | POA: Diagnosis not present

## 2019-11-27 DIAGNOSIS — R296 Repeated falls: Secondary | ICD-10-CM | POA: Diagnosis not present

## 2019-11-27 DIAGNOSIS — R2689 Other abnormalities of gait and mobility: Secondary | ICD-10-CM | POA: Diagnosis not present

## 2019-11-28 DIAGNOSIS — R2689 Other abnormalities of gait and mobility: Secondary | ICD-10-CM | POA: Diagnosis not present

## 2019-11-28 DIAGNOSIS — R296 Repeated falls: Secondary | ICD-10-CM | POA: Diagnosis not present

## 2019-11-28 DIAGNOSIS — R488 Other symbolic dysfunctions: Secondary | ICD-10-CM | POA: Diagnosis not present

## 2019-11-28 DIAGNOSIS — M6281 Muscle weakness (generalized): Secondary | ICD-10-CM | POA: Diagnosis not present

## 2019-12-01 DIAGNOSIS — R296 Repeated falls: Secondary | ICD-10-CM | POA: Diagnosis not present

## 2019-12-01 DIAGNOSIS — R488 Other symbolic dysfunctions: Secondary | ICD-10-CM | POA: Diagnosis not present

## 2019-12-01 DIAGNOSIS — M6281 Muscle weakness (generalized): Secondary | ICD-10-CM | POA: Diagnosis not present

## 2019-12-01 DIAGNOSIS — R2689 Other abnormalities of gait and mobility: Secondary | ICD-10-CM | POA: Diagnosis not present

## 2019-12-02 DIAGNOSIS — M6281 Muscle weakness (generalized): Secondary | ICD-10-CM | POA: Diagnosis not present

## 2019-12-02 DIAGNOSIS — R296 Repeated falls: Secondary | ICD-10-CM | POA: Diagnosis not present

## 2019-12-02 DIAGNOSIS — R488 Other symbolic dysfunctions: Secondary | ICD-10-CM | POA: Diagnosis not present

## 2019-12-02 DIAGNOSIS — R2689 Other abnormalities of gait and mobility: Secondary | ICD-10-CM | POA: Diagnosis not present

## 2019-12-03 DIAGNOSIS — Z20828 Contact with and (suspected) exposure to other viral communicable diseases: Secondary | ICD-10-CM | POA: Diagnosis not present

## 2019-12-03 DIAGNOSIS — Z1159 Encounter for screening for other viral diseases: Secondary | ICD-10-CM | POA: Diagnosis not present

## 2019-12-04 DIAGNOSIS — R296 Repeated falls: Secondary | ICD-10-CM | POA: Diagnosis not present

## 2019-12-04 DIAGNOSIS — R2689 Other abnormalities of gait and mobility: Secondary | ICD-10-CM | POA: Diagnosis not present

## 2019-12-04 DIAGNOSIS — M6281 Muscle weakness (generalized): Secondary | ICD-10-CM | POA: Diagnosis not present

## 2019-12-04 DIAGNOSIS — R488 Other symbolic dysfunctions: Secondary | ICD-10-CM | POA: Diagnosis not present

## 2019-12-05 DIAGNOSIS — R2689 Other abnormalities of gait and mobility: Secondary | ICD-10-CM | POA: Diagnosis not present

## 2019-12-05 DIAGNOSIS — R296 Repeated falls: Secondary | ICD-10-CM | POA: Diagnosis not present

## 2019-12-05 DIAGNOSIS — R488 Other symbolic dysfunctions: Secondary | ICD-10-CM | POA: Diagnosis not present

## 2019-12-05 DIAGNOSIS — M6281 Muscle weakness (generalized): Secondary | ICD-10-CM | POA: Diagnosis not present

## 2019-12-08 DIAGNOSIS — R2689 Other abnormalities of gait and mobility: Secondary | ICD-10-CM | POA: Diagnosis not present

## 2019-12-08 DIAGNOSIS — R488 Other symbolic dysfunctions: Secondary | ICD-10-CM | POA: Diagnosis not present

## 2019-12-08 DIAGNOSIS — M6281 Muscle weakness (generalized): Secondary | ICD-10-CM | POA: Diagnosis not present

## 2019-12-08 DIAGNOSIS — R41841 Cognitive communication deficit: Secondary | ICD-10-CM | POA: Diagnosis not present

## 2019-12-08 DIAGNOSIS — R296 Repeated falls: Secondary | ICD-10-CM | POA: Diagnosis not present

## 2019-12-09 DIAGNOSIS — R2689 Other abnormalities of gait and mobility: Secondary | ICD-10-CM | POA: Diagnosis not present

## 2019-12-09 DIAGNOSIS — R41841 Cognitive communication deficit: Secondary | ICD-10-CM | POA: Diagnosis not present

## 2019-12-09 DIAGNOSIS — R488 Other symbolic dysfunctions: Secondary | ICD-10-CM | POA: Diagnosis not present

## 2019-12-09 DIAGNOSIS — M6281 Muscle weakness (generalized): Secondary | ICD-10-CM | POA: Diagnosis not present

## 2019-12-09 DIAGNOSIS — R296 Repeated falls: Secondary | ICD-10-CM | POA: Diagnosis not present

## 2019-12-10 DIAGNOSIS — Z1159 Encounter for screening for other viral diseases: Secondary | ICD-10-CM | POA: Diagnosis not present

## 2019-12-10 DIAGNOSIS — R488 Other symbolic dysfunctions: Secondary | ICD-10-CM | POA: Diagnosis not present

## 2019-12-10 DIAGNOSIS — R2689 Other abnormalities of gait and mobility: Secondary | ICD-10-CM | POA: Diagnosis not present

## 2019-12-10 DIAGNOSIS — R41841 Cognitive communication deficit: Secondary | ICD-10-CM | POA: Diagnosis not present

## 2019-12-10 DIAGNOSIS — Z20828 Contact with and (suspected) exposure to other viral communicable diseases: Secondary | ICD-10-CM | POA: Diagnosis not present

## 2019-12-10 DIAGNOSIS — R296 Repeated falls: Secondary | ICD-10-CM | POA: Diagnosis not present

## 2019-12-10 DIAGNOSIS — M6281 Muscle weakness (generalized): Secondary | ICD-10-CM | POA: Diagnosis not present

## 2019-12-11 DIAGNOSIS — R2689 Other abnormalities of gait and mobility: Secondary | ICD-10-CM | POA: Diagnosis not present

## 2019-12-11 DIAGNOSIS — R296 Repeated falls: Secondary | ICD-10-CM | POA: Diagnosis not present

## 2019-12-11 DIAGNOSIS — R41841 Cognitive communication deficit: Secondary | ICD-10-CM | POA: Diagnosis not present

## 2019-12-11 DIAGNOSIS — R488 Other symbolic dysfunctions: Secondary | ICD-10-CM | POA: Diagnosis not present

## 2019-12-11 DIAGNOSIS — M6281 Muscle weakness (generalized): Secondary | ICD-10-CM | POA: Diagnosis not present

## 2019-12-12 DIAGNOSIS — R488 Other symbolic dysfunctions: Secondary | ICD-10-CM | POA: Diagnosis not present

## 2019-12-12 DIAGNOSIS — R2689 Other abnormalities of gait and mobility: Secondary | ICD-10-CM | POA: Diagnosis not present

## 2019-12-12 DIAGNOSIS — R41841 Cognitive communication deficit: Secondary | ICD-10-CM | POA: Diagnosis not present

## 2019-12-12 DIAGNOSIS — M6281 Muscle weakness (generalized): Secondary | ICD-10-CM | POA: Diagnosis not present

## 2019-12-12 DIAGNOSIS — R296 Repeated falls: Secondary | ICD-10-CM | POA: Diagnosis not present

## 2019-12-16 DIAGNOSIS — R2689 Other abnormalities of gait and mobility: Secondary | ICD-10-CM | POA: Diagnosis not present

## 2019-12-16 DIAGNOSIS — R296 Repeated falls: Secondary | ICD-10-CM | POA: Diagnosis not present

## 2019-12-16 DIAGNOSIS — R41841 Cognitive communication deficit: Secondary | ICD-10-CM | POA: Diagnosis not present

## 2019-12-16 DIAGNOSIS — R488 Other symbolic dysfunctions: Secondary | ICD-10-CM | POA: Diagnosis not present

## 2019-12-16 DIAGNOSIS — M6281 Muscle weakness (generalized): Secondary | ICD-10-CM | POA: Diagnosis not present

## 2019-12-17 DIAGNOSIS — R296 Repeated falls: Secondary | ICD-10-CM | POA: Diagnosis not present

## 2019-12-17 DIAGNOSIS — R488 Other symbolic dysfunctions: Secondary | ICD-10-CM | POA: Diagnosis not present

## 2019-12-17 DIAGNOSIS — R41841 Cognitive communication deficit: Secondary | ICD-10-CM | POA: Diagnosis not present

## 2019-12-17 DIAGNOSIS — M6281 Muscle weakness (generalized): Secondary | ICD-10-CM | POA: Diagnosis not present

## 2019-12-17 DIAGNOSIS — R2689 Other abnormalities of gait and mobility: Secondary | ICD-10-CM | POA: Diagnosis not present

## 2019-12-17 DIAGNOSIS — Z20828 Contact with and (suspected) exposure to other viral communicable diseases: Secondary | ICD-10-CM | POA: Diagnosis not present

## 2019-12-17 DIAGNOSIS — Z1159 Encounter for screening for other viral diseases: Secondary | ICD-10-CM | POA: Diagnosis not present

## 2019-12-18 DIAGNOSIS — M6281 Muscle weakness (generalized): Secondary | ICD-10-CM | POA: Diagnosis not present

## 2019-12-18 DIAGNOSIS — R296 Repeated falls: Secondary | ICD-10-CM | POA: Diagnosis not present

## 2019-12-18 DIAGNOSIS — R488 Other symbolic dysfunctions: Secondary | ICD-10-CM | POA: Diagnosis not present

## 2019-12-18 DIAGNOSIS — R41841 Cognitive communication deficit: Secondary | ICD-10-CM | POA: Diagnosis not present

## 2019-12-18 DIAGNOSIS — R2689 Other abnormalities of gait and mobility: Secondary | ICD-10-CM | POA: Diagnosis not present

## 2019-12-19 ENCOUNTER — Other Ambulatory Visit: Payer: Self-pay

## 2019-12-19 ENCOUNTER — Encounter: Payer: Self-pay | Admitting: Family Medicine

## 2019-12-19 ENCOUNTER — Ambulatory Visit (INDEPENDENT_AMBULATORY_CARE_PROVIDER_SITE_OTHER): Payer: Medicare Other | Admitting: Family Medicine

## 2019-12-19 ENCOUNTER — Ambulatory Visit
Admission: RE | Admit: 2019-12-19 | Discharge: 2019-12-19 | Disposition: A | Discharge status: Home / Self Care | Payer: Medicare Other | Source: Ambulatory Visit | Attending: Family Medicine | Admitting: Family Medicine

## 2019-12-19 VITALS — BP 132/80 | HR 67 | Temp 98.2°F | Ht 61.0 in | Wt 171.0 lb

## 2019-12-19 DIAGNOSIS — R488 Other symbolic dysfunctions: Secondary | ICD-10-CM | POA: Diagnosis not present

## 2019-12-19 DIAGNOSIS — R079 Chest pain, unspecified: Secondary | ICD-10-CM

## 2019-12-19 DIAGNOSIS — R2689 Other abnormalities of gait and mobility: Secondary | ICD-10-CM | POA: Diagnosis not present

## 2019-12-19 DIAGNOSIS — M419 Scoliosis, unspecified: Secondary | ICD-10-CM | POA: Diagnosis not present

## 2019-12-19 DIAGNOSIS — R0902 Hypoxemia: Secondary | ICD-10-CM

## 2019-12-19 DIAGNOSIS — R0602 Shortness of breath: Secondary | ICD-10-CM | POA: Diagnosis not present

## 2019-12-19 DIAGNOSIS — R600 Localized edema: Secondary | ICD-10-CM | POA: Diagnosis not present

## 2019-12-19 DIAGNOSIS — M6281 Muscle weakness (generalized): Secondary | ICD-10-CM | POA: Diagnosis not present

## 2019-12-19 DIAGNOSIS — R296 Repeated falls: Secondary | ICD-10-CM | POA: Diagnosis not present

## 2019-12-19 DIAGNOSIS — R918 Other nonspecific abnormal finding of lung field: Secondary | ICD-10-CM | POA: Diagnosis not present

## 2019-12-19 DIAGNOSIS — R41841 Cognitive communication deficit: Secondary | ICD-10-CM | POA: Diagnosis not present

## 2019-12-19 DIAGNOSIS — I700 Atherosclerosis of aorta: Secondary | ICD-10-CM | POA: Diagnosis not present

## 2019-12-19 MED ORDER — IOPAMIDOL (ISOVUE-370) INJECTION 76%
75.0000 mL | Freq: Once | INTRAVENOUS | Status: AC | PRN
  Administered 2019-12-19: 75 mL via INTRAVENOUS

## 2019-12-19 NOTE — Progress Notes (Signed)
Established Patient Office Visit  Subjective:  Patient ID: Theresa Norton, female    DOB: 11-07-1928  Age: 86 y.o. MRN: [id number]  CC:  Chief Complaint  Patient presents with  . Shortness of Breath    HPI Theresa Norton presents for evaluating hypoxia recently noted by physical therapist at her residential facility which is [facility].  They were getting ready to do some physical therapy and noted that her oxygen was somewhere down in the mid 80s range.  This is atypical for her.  With some deep breathing this went up to 89%.  Patient first denied any dyspnea but then states she has had some recent dyspnea just over the past week.  She has not had any recent upper respiratory infectious symptoms.  She also apparently had a few days now of left lower extremity swelling which started Tuesday.  Denies any exertional chest pain.  No cough.  No fevers or chills.  No hemoptysis.  She has never smoked.  No chronic lung issues.  No prior history of DVT.  She is in no distress at rest.  Chronic problems include past history of atrial fibrillation, heart block, hypertension, cognitive impairment, obesity.  She resides at [facility] and apparently is very sedentary.  Past Medical History:  Diagnosis Date  . Allergy   . Anemia   . Arthritis    "in my fingers" (07/08/2013)  . CAD (coronary artery disease)    a. 05/2013 nonobs dzs by cath.  . Colon polyps   . Groin hematoma    a. 05/2013 R groin hematoma post-cath - u/s 05/31/13 large 7.49 cm r inguinal hematoma extending into thigh, no psa or avf.  . H/O hiatal hernia   . Heart block AV second degree    a. s/p MDT Addapta dual chamber pacemaker 05/2013 by Dr Rayann Heman.  . Hypertension   . On home oxygen therapy    "2L maybe 12h/day" (07/08/2013)  . Pacemaker   . Pacemaker   . PAF (paroxysmal atrial fibrillation) ([place])    a. Found post-pacer 05/2013->Xarelto added;  b. 05/2013 Echo: EF 60-65%, mild LVH, nl wall motion w/o rwma.     Past Surgical History:  Procedure Laterality Date  . BREAST CYST EXCISION Right 1959; 1970's  . BUNIONECTOMY Bilateral 1967  . CARDIAC CATHETERIZATION    . CATARACT EXTRACTION W/ INTRAOCULAR LENS  IMPLANT, BILATERAL Bilateral   . DILATION AND CURETTAGE OF UTERUS    . INSERT / REPLACE / REMOVE PACEMAKER  05/2013   MDT [id number] implanted by Dr Rayann Heman for heart block  . INSERT / REPLACE / REMOVE PACEMAKER  06/2013   Right ventricular lead perforation with tamponade  . LEAD REVISION N/A 06/13/2013   Procedure: LEAD REVISION;  Surgeon: Coralyn Mark, MD;  Location: [facility];  Service: Cardiovascular;  Laterality: N/A;  . LEFT HEART CATHETERIZATION WITH CORONARY ANGIOGRAM N/A 05/27/2013   Procedure: LEFT HEART CATHETERIZATION WITH CORONARY ANGIOGRAM;  Surgeon: Birdie Riddle, MD;  Location: [facility];  Service: Cardiovascular;  Laterality: N/A;  . PERICARDIAL TAP N/A 06/11/2013   Procedure: PERICARDIAL TAP;  Surgeon: Birdie Riddle, MD;  Location: [facility];  Service: Cardiovascular;  Laterality: N/A;  . PERMANENT PACEMAKER INSERTION N/A 05/27/2013   Procedure: PERMANENT PACEMAKER INSERTION;  Surgeon: Coralyn Mark, MD;  Location: [facility];  Service: Cardiovascular;  Laterality: N/A;  . TEMPORARY PACEMAKER INSERTION N/A 05/27/2013   Procedure: TEMPORARY PACEMAKER INSERTION;  Surgeon: Lorenda Ishihara  Doylene Canard, MD;  Location: [facility];  Service: Cardiovascular;  Laterality: N/A;  . VARICOSE VEIN SURGERY Bilateral ~ 2002    Family History  Problem Relation Age of Onset  . Cancer Father        lung    Social History   Socioeconomic History  . Marital status: Married    Spouse name: Not on file  . Number of children: Not on file  . Years of education: Not on file  . Highest education level: Not on file  Occupational History  . Not on file  Tobacco Use  . Smoking status: Never Smoker  . Smokeless tobacco: Never Used  Vaping Use  . Vaping Use: Never used  Substance and Sexual Activity   . Alcohol use: Yes    Alcohol/week: 14.0 standard drinks    Types: 7 Glasses of wine, 7 Cans of beer per week    Comment: 2019: wine in the evening   . Drug use: No  . Sexual activity: Never  Other Topics Concern  . Not on file  Social History Narrative   Living at [place] Strain: Low Risk   . Difficulty of Paying Living Expenses: Not hard at all  Food Insecurity: No Food Insecurity  . Worried About Charity fundraiser in the Last Year: Never true  . Ran Out of Food in the Last Year: Never true  Transportation Needs: No Transportation Needs  . Lack of Transportation (Medical): No  . Lack of Transportation (Non-Medical): No  Physical Activity: Insufficiently Active  . Days of Exercise per Week: 7 days  . Minutes of Exercise per Session: 10 min  Stress: No Stress Concern Present  . Feeling of Stress : Only a little  Social Connections: Unknown  . Frequency of Communication with Friends and Family: Not on file  . Frequency of Social Gatherings with Friends and Family: Twice a week  . Attends Religious Services: Not on file  . Active Member of Clubs or Organizations: Not on file  . Attends Archivist Meetings: Not on file  . Marital Status: Not on file  Intimate Partner Violence:   . Fear of Current or Ex-Partner:   . Emotionally Abused:   Marland Kitchen Physically Abused:   . Sexually Abused:     Outpatient Medications Prior to Visit  Medication Sig Dispense Refill  . acetaminophen (TYLENOL) 325 MG tablet Take 325 mg by mouth every 6 (six) hours as needed for mild pain.    Marland Kitchen amLODipine (NORVASC) 5 MG tablet TAKE 1 TABLET BY MOUTH EVERY DAY 90 tablet 0  . beta carotene w/minerals (OCUVITE) tablet Take 1 tablet by mouth daily.    Marland Kitchen dextromethorphan-guaiFENesin (MUCINEX DM) 30-600 MG 12hr tablet Take 1 tablet by mouth 2 (two) times daily as needed for cough.    . donepezil (ARICEPT) 5 MG tablet TAKE 1 TABLET(5 MG) BY  MOUTH AT BEDTIME 30 tablet 1  . loratadine (CLARITIN) 10 MG tablet Take 10 mg by mouth daily as needed for allergies.     Marland Kitchen sertraline (ZOLOFT) 50 MG tablet TAKE 1 TABLET(50 MG) BY MOUTH DAILY 90 tablet 0  . TURMERIC PO Take 1 capsule by mouth daily.      No facility-administered medications prior to visit.    Allergies  Allergen Reactions  . Codeine     GI upset  . Morphine And Related Other (See Comments)    GI upset GI  upset  . Other Other (See Comments)    Pain killers, unknown    ROS Review of Systems  Constitutional: Negative for chills, fatigue and fever.  Respiratory: Positive for shortness of breath. Negative for cough and wheezing.   Cardiovascular: Positive for leg swelling. Negative for palpitations.  Gastrointestinal: Negative for abdominal pain.  Genitourinary: Negative for dysuria.  Musculoskeletal: Negative for back pain.  Neurological: Negative for dizziness and syncope.      Objective:    Physical Exam Vitals reviewed.  Constitutional:      Appearance: She is obese.  Cardiovascular:     Rate and Rhythm: Normal rate and regular rhythm.  Pulmonary:     Effort: Pulmonary effort is normal.     Breath sounds: Normal breath sounds. No decreased breath sounds, wheezing or rales.  Musculoskeletal:     Comments: She has some obvious asymmetric edema left lower extremity greater than right.  She does not have any calf or thigh tenderness on the left side.  No erythema.  Neurological:     Mental Status: She is alert.     BP 132/80   Pulse 67   Temp 98.2 F (36.8 C) (Other (Comment))   Ht 5\' 1"  (1.549 m)   Wt 171 lb (77.6 kg)   SpO2 92%   BMI 32.31 kg/m  Wt Readings from Last 3 Encounters:  12/19/19 171 lb (77.6 kg)  10/21/19 179 lb 3.2 oz (81.3 kg)  06/11/19 170 lb (77.1 kg)     Health Maintenance Due  Topic Date Due  . COVID-19 Vaccine (1) Never done  . DEXA SCAN  Never done  . PNA vac Low Risk Adult (1 of 2 - PCV13) Never done  .  TETANUS/TDAP  04/11/2007  . INFLUENZA VACCINE  12/07/2019    There are no preventive care reminders to display for this patient.  Lab Results  Component Value Date   TSH 1.11 09/14/2015   Lab Results  Component Value Date   WBC 9.1 09/14/2015   HGB 12.3 09/14/2015   HCT 35.4 09/14/2015   MCV 95.4 09/14/2015   PLT 267 09/14/2015   Lab Results  Component Value Date   NA 134 (L) 03/11/2018   K 4.6 03/11/2018   CO2 26 03/11/2018   GLUCOSE 95 03/11/2018   BUN 13 03/11/2018   CREATININE 0.91 03/11/2018   BILITOT 0.7 02/23/2016   ALKPHOS 96 02/23/2016   AST 29 02/23/2016   ALT 17 02/23/2016   PROT 7.5 02/23/2016   ALBUMIN 4.1 02/23/2016   CALCIUM 9.3 03/11/2018   GFR 62.08 03/11/2018   Lab Results  Component Value Date   CHOL 232 (H) 04/17/2012   Lab Results  Component Value Date   HDL 96.80 04/17/2012   No results found for: [facility] Lab Results  Component Value Date   TRIG 136.0 04/17/2012   Lab Results  Component Value Date   CHOLHDL 2 04/17/2012   No results found for: HGBA1C    Assessment & Plan:   Patient presents with chief complaint of some hypoxia noted on pulse oximetry at [facility] by physical therapist.  She does not have any chronic lung disease and this (hypoxia) appears to be a new finding.  Concerning is the fact that she has some obvious asymmetric swelling left lower extremity greater than right.  She is in no respiratory distress at this time and pulse oximetry at rest 92%.  She is somewhat a difficult historian because of her cognitive impairment.  Her son is with her who provides most of the history  -Given new onset hypoxemia and left lower extremity swelling fairly high suspicion for possible pulmonary embolus or emboli. -CT angiogram chest-which she will get today.  If this is positive we will discuss with son possible hospitalization. -If negative, consider venous Dopplers left lower extremity  No orders of the defined types were  placed in this encounter.   Follow-up: No follow-ups on file.    Carolann Littler, MD

## 2019-12-21 NOTE — Addendum Note (Signed)
Addended by: Eulas Post on: 12/21/2019 10:06 AM   Modules accepted: Orders

## 2019-12-22 DIAGNOSIS — M6281 Muscle weakness (generalized): Secondary | ICD-10-CM | POA: Diagnosis not present

## 2019-12-22 DIAGNOSIS — R2689 Other abnormalities of gait and mobility: Secondary | ICD-10-CM | POA: Diagnosis not present

## 2019-12-22 DIAGNOSIS — R41841 Cognitive communication deficit: Secondary | ICD-10-CM | POA: Diagnosis not present

## 2019-12-22 DIAGNOSIS — R296 Repeated falls: Secondary | ICD-10-CM | POA: Diagnosis not present

## 2019-12-22 DIAGNOSIS — R488 Other symbolic dysfunctions: Secondary | ICD-10-CM | POA: Diagnosis not present

## 2019-12-23 DIAGNOSIS — R488 Other symbolic dysfunctions: Secondary | ICD-10-CM | POA: Diagnosis not present

## 2019-12-23 DIAGNOSIS — M6281 Muscle weakness (generalized): Secondary | ICD-10-CM | POA: Diagnosis not present

## 2019-12-23 DIAGNOSIS — R296 Repeated falls: Secondary | ICD-10-CM | POA: Diagnosis not present

## 2019-12-23 DIAGNOSIS — R41841 Cognitive communication deficit: Secondary | ICD-10-CM | POA: Diagnosis not present

## 2019-12-23 DIAGNOSIS — R2689 Other abnormalities of gait and mobility: Secondary | ICD-10-CM | POA: Diagnosis not present

## 2019-12-24 DIAGNOSIS — R2689 Other abnormalities of gait and mobility: Secondary | ICD-10-CM | POA: Diagnosis not present

## 2019-12-24 DIAGNOSIS — R488 Other symbolic dysfunctions: Secondary | ICD-10-CM | POA: Diagnosis not present

## 2019-12-24 DIAGNOSIS — Z20828 Contact with and (suspected) exposure to other viral communicable diseases: Secondary | ICD-10-CM | POA: Diagnosis not present

## 2019-12-24 DIAGNOSIS — R296 Repeated falls: Secondary | ICD-10-CM | POA: Diagnosis not present

## 2019-12-24 DIAGNOSIS — M6281 Muscle weakness (generalized): Secondary | ICD-10-CM | POA: Diagnosis not present

## 2019-12-24 DIAGNOSIS — Z1159 Encounter for screening for other viral diseases: Secondary | ICD-10-CM | POA: Diagnosis not present

## 2019-12-24 DIAGNOSIS — R41841 Cognitive communication deficit: Secondary | ICD-10-CM | POA: Diagnosis not present

## 2019-12-25 DIAGNOSIS — R41841 Cognitive communication deficit: Secondary | ICD-10-CM | POA: Diagnosis not present

## 2019-12-25 DIAGNOSIS — R296 Repeated falls: Secondary | ICD-10-CM | POA: Diagnosis not present

## 2019-12-25 DIAGNOSIS — R2689 Other abnormalities of gait and mobility: Secondary | ICD-10-CM | POA: Diagnosis not present

## 2019-12-25 DIAGNOSIS — M6281 Muscle weakness (generalized): Secondary | ICD-10-CM | POA: Diagnosis not present

## 2019-12-25 DIAGNOSIS — R488 Other symbolic dysfunctions: Secondary | ICD-10-CM | POA: Diagnosis not present

## 2019-12-26 DIAGNOSIS — M6281 Muscle weakness (generalized): Secondary | ICD-10-CM | POA: Diagnosis not present

## 2019-12-26 DIAGNOSIS — R41841 Cognitive communication deficit: Secondary | ICD-10-CM | POA: Diagnosis not present

## 2019-12-26 DIAGNOSIS — R296 Repeated falls: Secondary | ICD-10-CM | POA: Diagnosis not present

## 2019-12-26 DIAGNOSIS — R488 Other symbolic dysfunctions: Secondary | ICD-10-CM | POA: Diagnosis not present

## 2019-12-26 DIAGNOSIS — R2689 Other abnormalities of gait and mobility: Secondary | ICD-10-CM | POA: Diagnosis not present

## 2019-12-29 DIAGNOSIS — R41841 Cognitive communication deficit: Secondary | ICD-10-CM | POA: Diagnosis not present

## 2019-12-29 DIAGNOSIS — M6281 Muscle weakness (generalized): Secondary | ICD-10-CM | POA: Diagnosis not present

## 2019-12-29 DIAGNOSIS — R2689 Other abnormalities of gait and mobility: Secondary | ICD-10-CM | POA: Diagnosis not present

## 2019-12-29 DIAGNOSIS — R488 Other symbolic dysfunctions: Secondary | ICD-10-CM | POA: Diagnosis not present

## 2019-12-29 DIAGNOSIS — R296 Repeated falls: Secondary | ICD-10-CM | POA: Diagnosis not present

## 2019-12-30 DIAGNOSIS — R488 Other symbolic dysfunctions: Secondary | ICD-10-CM | POA: Diagnosis not present

## 2019-12-30 DIAGNOSIS — R41841 Cognitive communication deficit: Secondary | ICD-10-CM | POA: Diagnosis not present

## 2019-12-30 DIAGNOSIS — M6281 Muscle weakness (generalized): Secondary | ICD-10-CM | POA: Diagnosis not present

## 2019-12-30 DIAGNOSIS — R2689 Other abnormalities of gait and mobility: Secondary | ICD-10-CM | POA: Diagnosis not present

## 2019-12-30 DIAGNOSIS — R296 Repeated falls: Secondary | ICD-10-CM | POA: Diagnosis not present

## 2019-12-31 DIAGNOSIS — R488 Other symbolic dysfunctions: Secondary | ICD-10-CM | POA: Diagnosis not present

## 2019-12-31 DIAGNOSIS — M6281 Muscle weakness (generalized): Secondary | ICD-10-CM | POA: Diagnosis not present

## 2019-12-31 DIAGNOSIS — Z20828 Contact with and (suspected) exposure to other viral communicable diseases: Secondary | ICD-10-CM | POA: Diagnosis not present

## 2019-12-31 DIAGNOSIS — R41841 Cognitive communication deficit: Secondary | ICD-10-CM | POA: Diagnosis not present

## 2019-12-31 DIAGNOSIS — R296 Repeated falls: Secondary | ICD-10-CM | POA: Diagnosis not present

## 2019-12-31 DIAGNOSIS — Z1159 Encounter for screening for other viral diseases: Secondary | ICD-10-CM | POA: Diagnosis not present

## 2019-12-31 DIAGNOSIS — R2689 Other abnormalities of gait and mobility: Secondary | ICD-10-CM | POA: Diagnosis not present

## 2020-01-01 ENCOUNTER — Other Ambulatory Visit: Payer: Self-pay

## 2020-01-01 ENCOUNTER — Ambulatory Visit (HOSPITAL_COMMUNITY)
Admission: RE | Admit: 2020-01-01 | Discharge: 2020-01-01 | Disposition: A | Discharge status: Home / Self Care | Payer: Medicare Other | Source: Ambulatory Visit | Attending: Cardiology | Admitting: Cardiology

## 2020-01-01 DIAGNOSIS — R600 Localized edema: Secondary | ICD-10-CM | POA: Insufficient documentation

## 2020-01-01 DIAGNOSIS — R41841 Cognitive communication deficit: Secondary | ICD-10-CM | POA: Diagnosis not present

## 2020-01-01 DIAGNOSIS — M6281 Muscle weakness (generalized): Secondary | ICD-10-CM | POA: Diagnosis not present

## 2020-01-01 DIAGNOSIS — R296 Repeated falls: Secondary | ICD-10-CM | POA: Diagnosis not present

## 2020-01-01 DIAGNOSIS — R488 Other symbolic dysfunctions: Secondary | ICD-10-CM | POA: Diagnosis not present

## 2020-01-01 DIAGNOSIS — R2689 Other abnormalities of gait and mobility: Secondary | ICD-10-CM | POA: Diagnosis not present

## 2020-01-02 DIAGNOSIS — R488 Other symbolic dysfunctions: Secondary | ICD-10-CM | POA: Diagnosis not present

## 2020-01-02 DIAGNOSIS — R2689 Other abnormalities of gait and mobility: Secondary | ICD-10-CM | POA: Diagnosis not present

## 2020-01-02 DIAGNOSIS — R41841 Cognitive communication deficit: Secondary | ICD-10-CM | POA: Diagnosis not present

## 2020-01-02 DIAGNOSIS — R296 Repeated falls: Secondary | ICD-10-CM | POA: Diagnosis not present

## 2020-01-02 DIAGNOSIS — M6281 Muscle weakness (generalized): Secondary | ICD-10-CM | POA: Diagnosis not present

## 2020-01-05 DIAGNOSIS — R488 Other symbolic dysfunctions: Secondary | ICD-10-CM | POA: Diagnosis not present

## 2020-01-05 DIAGNOSIS — R2689 Other abnormalities of gait and mobility: Secondary | ICD-10-CM | POA: Diagnosis not present

## 2020-01-05 DIAGNOSIS — R296 Repeated falls: Secondary | ICD-10-CM | POA: Diagnosis not present

## 2020-01-05 DIAGNOSIS — R41841 Cognitive communication deficit: Secondary | ICD-10-CM | POA: Diagnosis not present

## 2020-01-05 DIAGNOSIS — M6281 Muscle weakness (generalized): Secondary | ICD-10-CM | POA: Diagnosis not present

## 2020-01-06 DIAGNOSIS — R2689 Other abnormalities of gait and mobility: Secondary | ICD-10-CM | POA: Diagnosis not present

## 2020-01-06 DIAGNOSIS — M6281 Muscle weakness (generalized): Secondary | ICD-10-CM | POA: Diagnosis not present

## 2020-01-06 DIAGNOSIS — R41841 Cognitive communication deficit: Secondary | ICD-10-CM | POA: Diagnosis not present

## 2020-01-06 DIAGNOSIS — R488 Other symbolic dysfunctions: Secondary | ICD-10-CM | POA: Diagnosis not present

## 2020-01-06 DIAGNOSIS — R296 Repeated falls: Secondary | ICD-10-CM | POA: Diagnosis not present

## 2020-01-07 DIAGNOSIS — R296 Repeated falls: Secondary | ICD-10-CM | POA: Diagnosis not present

## 2020-01-07 DIAGNOSIS — R41841 Cognitive communication deficit: Secondary | ICD-10-CM | POA: Diagnosis not present

## 2020-01-07 DIAGNOSIS — M6281 Muscle weakness (generalized): Secondary | ICD-10-CM | POA: Diagnosis not present

## 2020-01-07 DIAGNOSIS — Z1159 Encounter for screening for other viral diseases: Secondary | ICD-10-CM | POA: Diagnosis not present

## 2020-01-07 DIAGNOSIS — Z20828 Contact with and (suspected) exposure to other viral communicable diseases: Secondary | ICD-10-CM | POA: Diagnosis not present

## 2020-01-07 DIAGNOSIS — R488 Other symbolic dysfunctions: Secondary | ICD-10-CM | POA: Diagnosis not present

## 2020-01-07 DIAGNOSIS — R2689 Other abnormalities of gait and mobility: Secondary | ICD-10-CM | POA: Diagnosis not present

## 2020-01-09 ENCOUNTER — Ambulatory Visit (INDEPENDENT_AMBULATORY_CARE_PROVIDER_SITE_OTHER): Payer: Medicare Other | Admitting: *Deleted

## 2020-01-09 DIAGNOSIS — M6281 Muscle weakness (generalized): Secondary | ICD-10-CM | POA: Diagnosis not present

## 2020-01-09 DIAGNOSIS — R2689 Other abnormalities of gait and mobility: Secondary | ICD-10-CM | POA: Diagnosis not present

## 2020-01-09 DIAGNOSIS — R488 Other symbolic dysfunctions: Secondary | ICD-10-CM | POA: Diagnosis not present

## 2020-01-09 DIAGNOSIS — R296 Repeated falls: Secondary | ICD-10-CM | POA: Diagnosis not present

## 2020-01-09 DIAGNOSIS — I442 Atrioventricular block, complete: Secondary | ICD-10-CM

## 2020-01-09 DIAGNOSIS — R41841 Cognitive communication deficit: Secondary | ICD-10-CM | POA: Diagnosis not present

## 2020-01-10 LAB — CUP PACEART REMOTE DEVICE CHECK
Battery Impedance: 789 Ohm
Battery Remaining Longevity: 65 mo
Battery Voltage: 2.78 V
Brady Statistic AP VP Percent: 1 %
Brady Statistic AP VS Percent: 0 %
Brady Statistic AS VP Percent: 98 %
Brady Statistic AS VS Percent: 1 %
Date Time Interrogation Session: 20210903162810
Implantable Lead Implant Date: 20150120
Implantable Lead Implant Date: 20150120
Implantable Lead Location: 753859
Implantable Lead Location: 753860
Implantable Lead Model: 5076
Implantable Lead Model: 5076
Implantable Pulse Generator Implant Date: 20150120
Lead Channel Impedance Value: 493 Ohm
Lead Channel Impedance Value: 502 Ohm
Lead Channel Pacing Threshold Amplitude: 0.5 V
Lead Channel Pacing Threshold Amplitude: 1 V
Lead Channel Pacing Threshold Pulse Width: 0.4 ms
Lead Channel Pacing Threshold Pulse Width: 0.4 ms
Lead Channel Setting Pacing Amplitude: 2 V
Lead Channel Setting Pacing Amplitude: 2.5 V
Lead Channel Setting Pacing Pulse Width: 0.4 ms
Lead Channel Setting Sensing Sensitivity: 2.8 mV

## 2020-01-13 DIAGNOSIS — R2689 Other abnormalities of gait and mobility: Secondary | ICD-10-CM | POA: Diagnosis not present

## 2020-01-13 DIAGNOSIS — R296 Repeated falls: Secondary | ICD-10-CM | POA: Diagnosis not present

## 2020-01-13 DIAGNOSIS — R488 Other symbolic dysfunctions: Secondary | ICD-10-CM | POA: Diagnosis not present

## 2020-01-13 DIAGNOSIS — M6281 Muscle weakness (generalized): Secondary | ICD-10-CM | POA: Diagnosis not present

## 2020-01-13 DIAGNOSIS — R41841 Cognitive communication deficit: Secondary | ICD-10-CM | POA: Diagnosis not present

## 2020-01-13 NOTE — Progress Notes (Signed)
Remote pacemaker transmission.   

## 2020-01-14 DIAGNOSIS — R41841 Cognitive communication deficit: Secondary | ICD-10-CM | POA: Diagnosis not present

## 2020-01-14 DIAGNOSIS — M6281 Muscle weakness (generalized): Secondary | ICD-10-CM | POA: Diagnosis not present

## 2020-01-14 DIAGNOSIS — R488 Other symbolic dysfunctions: Secondary | ICD-10-CM | POA: Diagnosis not present

## 2020-01-14 DIAGNOSIS — Z1159 Encounter for screening for other viral diseases: Secondary | ICD-10-CM | POA: Diagnosis not present

## 2020-01-14 DIAGNOSIS — R296 Repeated falls: Secondary | ICD-10-CM | POA: Diagnosis not present

## 2020-01-14 DIAGNOSIS — R2689 Other abnormalities of gait and mobility: Secondary | ICD-10-CM | POA: Diagnosis not present

## 2020-01-14 DIAGNOSIS — Z20828 Contact with and (suspected) exposure to other viral communicable diseases: Secondary | ICD-10-CM | POA: Diagnosis not present

## 2020-01-15 DIAGNOSIS — R488 Other symbolic dysfunctions: Secondary | ICD-10-CM | POA: Diagnosis not present

## 2020-01-15 DIAGNOSIS — R296 Repeated falls: Secondary | ICD-10-CM | POA: Diagnosis not present

## 2020-01-15 DIAGNOSIS — R41841 Cognitive communication deficit: Secondary | ICD-10-CM | POA: Diagnosis not present

## 2020-01-15 DIAGNOSIS — M6281 Muscle weakness (generalized): Secondary | ICD-10-CM | POA: Diagnosis not present

## 2020-01-15 DIAGNOSIS — R2689 Other abnormalities of gait and mobility: Secondary | ICD-10-CM | POA: Diagnosis not present

## 2020-01-16 DIAGNOSIS — R2689 Other abnormalities of gait and mobility: Secondary | ICD-10-CM | POA: Diagnosis not present

## 2020-01-16 DIAGNOSIS — R296 Repeated falls: Secondary | ICD-10-CM | POA: Diagnosis not present

## 2020-01-16 DIAGNOSIS — R41841 Cognitive communication deficit: Secondary | ICD-10-CM | POA: Diagnosis not present

## 2020-01-16 DIAGNOSIS — R488 Other symbolic dysfunctions: Secondary | ICD-10-CM | POA: Diagnosis not present

## 2020-01-16 DIAGNOSIS — M6281 Muscle weakness (generalized): Secondary | ICD-10-CM | POA: Diagnosis not present

## 2020-01-19 DIAGNOSIS — M6281 Muscle weakness (generalized): Secondary | ICD-10-CM | POA: Diagnosis not present

## 2020-01-19 DIAGNOSIS — R41841 Cognitive communication deficit: Secondary | ICD-10-CM | POA: Diagnosis not present

## 2020-01-19 DIAGNOSIS — R296 Repeated falls: Secondary | ICD-10-CM | POA: Diagnosis not present

## 2020-01-19 DIAGNOSIS — R488 Other symbolic dysfunctions: Secondary | ICD-10-CM | POA: Diagnosis not present

## 2020-01-19 DIAGNOSIS — R2689 Other abnormalities of gait and mobility: Secondary | ICD-10-CM | POA: Diagnosis not present

## 2020-01-20 DIAGNOSIS — M6281 Muscle weakness (generalized): Secondary | ICD-10-CM | POA: Diagnosis not present

## 2020-01-20 DIAGNOSIS — R488 Other symbolic dysfunctions: Secondary | ICD-10-CM | POA: Diagnosis not present

## 2020-01-20 DIAGNOSIS — R296 Repeated falls: Secondary | ICD-10-CM | POA: Diagnosis not present

## 2020-01-20 DIAGNOSIS — R41841 Cognitive communication deficit: Secondary | ICD-10-CM | POA: Diagnosis not present

## 2020-01-20 DIAGNOSIS — R2689 Other abnormalities of gait and mobility: Secondary | ICD-10-CM | POA: Diagnosis not present

## 2020-01-21 DIAGNOSIS — R488 Other symbolic dysfunctions: Secondary | ICD-10-CM | POA: Diagnosis not present

## 2020-01-21 DIAGNOSIS — Z20828 Contact with and (suspected) exposure to other viral communicable diseases: Secondary | ICD-10-CM | POA: Diagnosis not present

## 2020-01-21 DIAGNOSIS — M6281 Muscle weakness (generalized): Secondary | ICD-10-CM | POA: Diagnosis not present

## 2020-01-21 DIAGNOSIS — Z1159 Encounter for screening for other viral diseases: Secondary | ICD-10-CM | POA: Diagnosis not present

## 2020-01-21 DIAGNOSIS — R296 Repeated falls: Secondary | ICD-10-CM | POA: Diagnosis not present

## 2020-01-21 DIAGNOSIS — R2689 Other abnormalities of gait and mobility: Secondary | ICD-10-CM | POA: Diagnosis not present

## 2020-01-21 DIAGNOSIS — R41841 Cognitive communication deficit: Secondary | ICD-10-CM | POA: Diagnosis not present

## 2020-01-22 DIAGNOSIS — M6281 Muscle weakness (generalized): Secondary | ICD-10-CM | POA: Diagnosis not present

## 2020-01-22 DIAGNOSIS — R488 Other symbolic dysfunctions: Secondary | ICD-10-CM | POA: Diagnosis not present

## 2020-01-22 DIAGNOSIS — R41841 Cognitive communication deficit: Secondary | ICD-10-CM | POA: Diagnosis not present

## 2020-01-22 DIAGNOSIS — R296 Repeated falls: Secondary | ICD-10-CM | POA: Diagnosis not present

## 2020-01-22 DIAGNOSIS — R2689 Other abnormalities of gait and mobility: Secondary | ICD-10-CM | POA: Diagnosis not present

## 2020-01-26 DIAGNOSIS — R296 Repeated falls: Secondary | ICD-10-CM | POA: Diagnosis not present

## 2020-01-26 DIAGNOSIS — M6281 Muscle weakness (generalized): Secondary | ICD-10-CM | POA: Diagnosis not present

## 2020-01-26 DIAGNOSIS — R41841 Cognitive communication deficit: Secondary | ICD-10-CM | POA: Diagnosis not present

## 2020-01-26 DIAGNOSIS — R488 Other symbolic dysfunctions: Secondary | ICD-10-CM | POA: Diagnosis not present

## 2020-01-26 DIAGNOSIS — R2689 Other abnormalities of gait and mobility: Secondary | ICD-10-CM | POA: Diagnosis not present

## 2020-01-27 ENCOUNTER — Other Ambulatory Visit: Payer: Self-pay | Admitting: Family Medicine

## 2020-01-27 DIAGNOSIS — R41841 Cognitive communication deficit: Secondary | ICD-10-CM | POA: Diagnosis not present

## 2020-01-27 DIAGNOSIS — R2689 Other abnormalities of gait and mobility: Secondary | ICD-10-CM | POA: Diagnosis not present

## 2020-01-27 DIAGNOSIS — M6281 Muscle weakness (generalized): Secondary | ICD-10-CM | POA: Diagnosis not present

## 2020-01-27 DIAGNOSIS — Z1159 Encounter for screening for other viral diseases: Secondary | ICD-10-CM | POA: Diagnosis not present

## 2020-01-27 DIAGNOSIS — Z20828 Contact with and (suspected) exposure to other viral communicable diseases: Secondary | ICD-10-CM | POA: Diagnosis not present

## 2020-01-27 DIAGNOSIS — R296 Repeated falls: Secondary | ICD-10-CM | POA: Diagnosis not present

## 2020-01-27 DIAGNOSIS — R488 Other symbolic dysfunctions: Secondary | ICD-10-CM | POA: Diagnosis not present

## 2020-01-28 DIAGNOSIS — M6281 Muscle weakness (generalized): Secondary | ICD-10-CM | POA: Diagnosis not present

## 2020-01-28 DIAGNOSIS — R41841 Cognitive communication deficit: Secondary | ICD-10-CM | POA: Diagnosis not present

## 2020-01-28 DIAGNOSIS — R296 Repeated falls: Secondary | ICD-10-CM | POA: Diagnosis not present

## 2020-01-28 DIAGNOSIS — R488 Other symbolic dysfunctions: Secondary | ICD-10-CM | POA: Diagnosis not present

## 2020-01-28 DIAGNOSIS — R2689 Other abnormalities of gait and mobility: Secondary | ICD-10-CM | POA: Diagnosis not present

## 2020-01-29 DIAGNOSIS — R2689 Other abnormalities of gait and mobility: Secondary | ICD-10-CM | POA: Diagnosis not present

## 2020-01-29 DIAGNOSIS — R488 Other symbolic dysfunctions: Secondary | ICD-10-CM | POA: Diagnosis not present

## 2020-01-29 DIAGNOSIS — R296 Repeated falls: Secondary | ICD-10-CM | POA: Diagnosis not present

## 2020-01-29 DIAGNOSIS — M6281 Muscle weakness (generalized): Secondary | ICD-10-CM | POA: Diagnosis not present

## 2020-01-29 DIAGNOSIS — R41841 Cognitive communication deficit: Secondary | ICD-10-CM | POA: Diagnosis not present

## 2020-01-30 DIAGNOSIS — R41841 Cognitive communication deficit: Secondary | ICD-10-CM | POA: Diagnosis not present

## 2020-01-30 DIAGNOSIS — R296 Repeated falls: Secondary | ICD-10-CM | POA: Diagnosis not present

## 2020-01-30 DIAGNOSIS — R488 Other symbolic dysfunctions: Secondary | ICD-10-CM | POA: Diagnosis not present

## 2020-01-30 DIAGNOSIS — R2689 Other abnormalities of gait and mobility: Secondary | ICD-10-CM | POA: Diagnosis not present

## 2020-01-30 DIAGNOSIS — M6281 Muscle weakness (generalized): Secondary | ICD-10-CM | POA: Diagnosis not present

## 2020-01-30 DIAGNOSIS — Z20828 Contact with and (suspected) exposure to other viral communicable diseases: Secondary | ICD-10-CM | POA: Diagnosis not present

## 2020-01-30 DIAGNOSIS — Z1159 Encounter for screening for other viral diseases: Secondary | ICD-10-CM | POA: Diagnosis not present

## 2020-02-03 DIAGNOSIS — Z1159 Encounter for screening for other viral diseases: Secondary | ICD-10-CM | POA: Diagnosis not present

## 2020-02-03 DIAGNOSIS — Z20828 Contact with and (suspected) exposure to other viral communicable diseases: Secondary | ICD-10-CM | POA: Diagnosis not present

## 2020-02-04 ENCOUNTER — Other Ambulatory Visit: Payer: Self-pay | Admitting: Family Medicine

## 2020-02-06 DIAGNOSIS — Z1159 Encounter for screening for other viral diseases: Secondary | ICD-10-CM | POA: Diagnosis not present

## 2020-02-06 DIAGNOSIS — Z20828 Contact with and (suspected) exposure to other viral communicable diseases: Secondary | ICD-10-CM | POA: Diagnosis not present

## 2020-02-13 DIAGNOSIS — Z20828 Contact with and (suspected) exposure to other viral communicable diseases: Secondary | ICD-10-CM | POA: Diagnosis not present

## 2020-02-13 DIAGNOSIS — Z1159 Encounter for screening for other viral diseases: Secondary | ICD-10-CM | POA: Diagnosis not present

## 2020-02-20 DIAGNOSIS — Z1159 Encounter for screening for other viral diseases: Secondary | ICD-10-CM | POA: Diagnosis not present

## 2020-02-20 DIAGNOSIS — Z20828 Contact with and (suspected) exposure to other viral communicable diseases: Secondary | ICD-10-CM | POA: Diagnosis not present

## 2020-02-27 DIAGNOSIS — Z1159 Encounter for screening for other viral diseases: Secondary | ICD-10-CM | POA: Diagnosis not present

## 2020-02-27 DIAGNOSIS — Z20828 Contact with and (suspected) exposure to other viral communicable diseases: Secondary | ICD-10-CM | POA: Diagnosis not present

## 2020-03-01 ENCOUNTER — Other Ambulatory Visit: Payer: Self-pay | Admitting: Family Medicine

## 2020-03-02 DIAGNOSIS — Z20828 Contact with and (suspected) exposure to other viral communicable diseases: Secondary | ICD-10-CM | POA: Diagnosis not present

## 2020-03-02 DIAGNOSIS — Z1159 Encounter for screening for other viral diseases: Secondary | ICD-10-CM | POA: Diagnosis not present

## 2020-03-05 DIAGNOSIS — Z20828 Contact with and (suspected) exposure to other viral communicable diseases: Secondary | ICD-10-CM | POA: Diagnosis not present

## 2020-03-05 DIAGNOSIS — Z1159 Encounter for screening for other viral diseases: Secondary | ICD-10-CM | POA: Diagnosis not present

## 2020-03-09 DIAGNOSIS — Z1159 Encounter for screening for other viral diseases: Secondary | ICD-10-CM | POA: Diagnosis not present

## 2020-03-09 DIAGNOSIS — Z20828 Contact with and (suspected) exposure to other viral communicable diseases: Secondary | ICD-10-CM | POA: Diagnosis not present

## 2020-03-12 DIAGNOSIS — Z20828 Contact with and (suspected) exposure to other viral communicable diseases: Secondary | ICD-10-CM | POA: Diagnosis not present

## 2020-03-12 DIAGNOSIS — Z1159 Encounter for screening for other viral diseases: Secondary | ICD-10-CM | POA: Diagnosis not present

## 2020-03-19 DIAGNOSIS — Z20828 Contact with and (suspected) exposure to other viral communicable diseases: Secondary | ICD-10-CM | POA: Diagnosis not present

## 2020-03-19 DIAGNOSIS — Z1159 Encounter for screening for other viral diseases: Secondary | ICD-10-CM | POA: Diagnosis not present

## 2020-03-23 DIAGNOSIS — Z1159 Encounter for screening for other viral diseases: Secondary | ICD-10-CM | POA: Diagnosis not present

## 2020-03-23 DIAGNOSIS — Z20828 Contact with and (suspected) exposure to other viral communicable diseases: Secondary | ICD-10-CM | POA: Diagnosis not present

## 2020-03-24 NOTE — Progress Notes (Signed)
Hospice paperwork copied, signed and mailed back.

## 2020-03-26 DIAGNOSIS — Z1159 Encounter for screening for other viral diseases: Secondary | ICD-10-CM | POA: Diagnosis not present

## 2020-03-26 DIAGNOSIS — Z20828 Contact with and (suspected) exposure to other viral communicable diseases: Secondary | ICD-10-CM | POA: Diagnosis not present

## 2020-03-30 DIAGNOSIS — Z20828 Contact with and (suspected) exposure to other viral communicable diseases: Secondary | ICD-10-CM | POA: Diagnosis not present

## 2020-03-30 DIAGNOSIS — Z1159 Encounter for screening for other viral diseases: Secondary | ICD-10-CM | POA: Diagnosis not present

## 2020-04-06 DIAGNOSIS — Z20828 Contact with and (suspected) exposure to other viral communicable diseases: Secondary | ICD-10-CM | POA: Diagnosis not present

## 2020-04-06 DIAGNOSIS — Z1159 Encounter for screening for other viral diseases: Secondary | ICD-10-CM | POA: Diagnosis not present

## 2020-04-07 ENCOUNTER — Encounter (HOSPITAL_COMMUNITY): Payer: Self-pay

## 2020-04-07 ENCOUNTER — Emergency Department (HOSPITAL_COMMUNITY): Payer: Medicare Other

## 2020-04-07 ENCOUNTER — Emergency Department (HOSPITAL_COMMUNITY)
Admission: EM | Admit: 2020-04-07 | Discharge: 2020-04-08 | Disposition: A | Discharge status: Home / Self Care | Payer: Medicare Other | Attending: Emergency Medicine | Admitting: Emergency Medicine

## 2020-04-07 ENCOUNTER — Other Ambulatory Visit: Payer: Self-pay

## 2020-04-07 DIAGNOSIS — I2510 Atherosclerotic heart disease of native coronary artery without angina pectoris: Secondary | ICD-10-CM | POA: Diagnosis not present

## 2020-04-07 DIAGNOSIS — R42 Dizziness and giddiness: Secondary | ICD-10-CM | POA: Diagnosis not present

## 2020-04-07 DIAGNOSIS — Z79899 Other long term (current) drug therapy: Secondary | ICD-10-CM | POA: Diagnosis not present

## 2020-04-07 DIAGNOSIS — Z950 Presence of cardiac pacemaker: Secondary | ICD-10-CM | POA: Insufficient documentation

## 2020-04-07 DIAGNOSIS — R262 Difficulty in walking, not elsewhere classified: Secondary | ICD-10-CM | POA: Diagnosis not present

## 2020-04-07 DIAGNOSIS — I10 Essential (primary) hypertension: Secondary | ICD-10-CM | POA: Insufficient documentation

## 2020-04-07 LAB — BASIC METABOLIC PANEL
Anion gap: 13 (ref 5–15)
BUN: 11 mg/dL (ref 8–23)
CO2: 21 mmol/L — ABNORMAL LOW (ref 22–32)
Calcium: 9.1 mg/dL (ref 8.9–10.3)
Chloride: 100 mmol/L (ref 98–111)
Creatinine, Ser: 0.97 mg/dL (ref 0.44–1.00)
GFR, Estimated: 56 mL/min — ABNORMAL LOW (ref >60)
Glucose, Bld: 109 mg/dL — ABNORMAL HIGH (ref 70–99)
Potassium: 4.1 mmol/L (ref 3.5–5.1)
Sodium: 134 mmol/L — ABNORMAL LOW (ref 135–145)

## 2020-04-07 LAB — HEPATIC FUNCTION PANEL
ALT: 24 U/L (ref 0–44)
AST: 37 U/L (ref 15–41)
Albumin: 3.8 g/dL (ref 3.5–5.0)
Alkaline Phosphatase: 87 U/L (ref 38–126)
Bilirubin, Direct: 0.2 mg/dL (ref 0.0–0.2)
Indirect Bilirubin: 0.6 mg/dL (ref 0.3–0.9)
Total Bilirubin: 0.8 mg/dL (ref 0.3–1.2)
Total Protein: 7.5 g/dL (ref 6.5–8.1)

## 2020-04-07 LAB — CBC WITH DIFFERENTIAL/PLATELET
Abs Immature Granulocytes: 0.02 10*3/uL (ref 0.00–0.07)
Basophils Absolute: 0 10*3/uL (ref 0.0–0.1)
Basophils Relative: 1 %
Eosinophils Absolute: 0.1 10*3/uL (ref 0.0–0.5)
Eosinophils Relative: 1 %
HCT: 44.5 % (ref 36.0–46.0)
Hemoglobin: 15.4 g/dL — ABNORMAL HIGH (ref 12.0–15.0)
Immature Granulocytes: 0 %
Lymphocytes Relative: 20 %
Lymphs Abs: 1.5 10*3/uL (ref 0.7–4.0)
MCH: 34.7 pg — ABNORMAL HIGH (ref 26.0–34.0)
MCHC: 34.6 g/dL (ref 30.0–36.0)
MCV: 100.2 fL — ABNORMAL HIGH (ref 80.0–100.0)
Monocytes Absolute: 0.6 10*3/uL (ref 0.1–1.0)
Monocytes Relative: 8 %
Neutro Abs: 5.1 10*3/uL (ref 1.7–7.7)
Neutrophils Relative %: 70 %
Platelets: 242 10*3/uL (ref 150–400)
RBC: 4.44 MIL/uL (ref 3.87–5.11)
RDW: 12.7 % (ref 11.5–15.5)
WBC: 7.3 10*3/uL (ref 4.0–10.5)
nRBC: 0 % (ref 0.0–0.2)

## 2020-04-07 LAB — URINALYSIS, ROUTINE W REFLEX MICROSCOPIC
Bilirubin Urine: NEGATIVE
Glucose, UA: NEGATIVE mg/dL
Hgb urine dipstick: NEGATIVE
Ketones, ur: 5 mg/dL — AB
Leukocytes,Ua: NEGATIVE
Nitrite: NEGATIVE
Protein, ur: NEGATIVE mg/dL
Specific Gravity, Urine: 1.015 (ref 1.005–1.030)
pH: 6 (ref 5.0–8.0)

## 2020-04-07 LAB — CBG MONITORING, ED: Glucose-Capillary: 109 mg/dL — ABNORMAL HIGH (ref 70–99)

## 2020-04-07 MED ORDER — THIAMINE HCL 100 MG/ML IJ SOLN
100.0000 mg | Freq: Once | INTRAMUSCULAR | Status: AC
  Administered 2020-04-08: 100 mg via INTRAVENOUS
  Filled 2020-04-07: qty 2

## 2020-04-07 MED ORDER — IOHEXOL 350 MG/ML SOLN
80.0000 mL | Freq: Once | INTRAVENOUS | Status: AC | PRN
  Administered 2020-04-07: 80 mL via INTRAVENOUS

## 2020-04-07 MED ORDER — SODIUM CHLORIDE 0.9 % IV BOLUS
1000.0000 mL | Freq: Once | INTRAVENOUS | Status: AC
  Administered 2020-04-07: 1000 mL via INTRAVENOUS

## 2020-04-07 NOTE — ED Provider Notes (Signed)
[facility] EMERGENCY DEPARTMENT Provider Note   CSN: [id number] Arrival date & time: 04/07/20  1720     History Chief Complaint  Patient presents with  . Dizziness    Theresa Norton is a 86 y.o. female.  The history is provided by the patient.  Dizziness Quality:  Imbalance and lightheadedness Severity:  Mild Onset quality:  Gradual Progression:  Resolved Chronicity:  New Context: standing up   Relieved by:  Being still Worsened by:  Standing up Associated symptoms: no blood in stool, no chest pain, no diarrhea, no headaches, no hearing loss, no nausea, no palpitations, no shortness of breath, no syncope, no tinnitus, no vision changes, no vomiting and no weakness        Past Medical History:  Diagnosis Date  . Allergy   . Anemia   . Arthritis    "in my fingers" (07/08/2013)  . CAD (coronary artery disease)    a. 05/2013 nonobs dzs by cath.  . Colon polyps   . Groin hematoma    a. 05/2013 R groin hematoma post-cath - u/s 05/31/13 large 7.49 cm r inguinal hematoma extending into thigh, no psa or avf.  . H/O hiatal hernia   . Heart block AV second degree    a. s/p MDT Addapta dual chamber pacemaker 05/2013 by Dr Rayann Heman.  . Hypertension   . On home oxygen therapy    "2L maybe 12h/day" (07/08/2013)  . Pacemaker   . Pacemaker   . PAF (paroxysmal atrial fibrillation) ([place])    a. Found post-pacer 05/2013->Xarelto added;  b. 05/2013 Echo: EF 60-65%, mild LVH, nl wall motion w/o rwma.    Patient Active Problem List   Diagnosis Date Noted  . Cognitive impairment 12/23/2016  . Balance disorder 06/25/2016  . Edema 05/11/2014  . Pleural effusion 07/07/2013  . Anemia, iron deficiency 06/26/2013  . Hyponatremia 06/26/2013  . Displacement of pacemaker electrode lead 06/13/2013  . Pericardial effusion with cardiac tamponade 06/11/2013  . AF (atrial fibrillation) ([place]) 06/05/2013  . Complete heart block ([place]) 05/26/2013  . Obesity (BMI 30-39.9) 04/22/2013    . Wenckebach second degree AV block 04/23/2012  . Hypertension 04/17/2012  . Elevated blood pressure 04/10/2012  . History of colon polyps 04/10/2012    Past Surgical History:  Procedure Laterality Date  . BREAST CYST EXCISION Right 1959; 1970's  . BUNIONECTOMY Bilateral 1967  . CARDIAC CATHETERIZATION    . CATARACT EXTRACTION W/ INTRAOCULAR LENS  IMPLANT, BILATERAL Bilateral   . DILATION AND CURETTAGE OF UTERUS    . INSERT / REPLACE / REMOVE PACEMAKER  05/2013   MDT [id number] implanted by Dr Rayann Heman for heart block  . INSERT / REPLACE / REMOVE PACEMAKER  06/2013   Right ventricular lead perforation with tamponade  . LEAD REVISION N/A 06/13/2013   Procedure: LEAD REVISION;  Surgeon: Coralyn Mark, MD;  Location: [facility]ATH [facility];  Service: Cardiovascular;  Laterality: N/A;  . LEFT HEART CATHETERIZATION WITH CORONARY ANGIOGRAM N/A 05/27/2013   Procedure: LEFT HEART CATHETERIZATION WITH CORONARY ANGIOGRAM;  Surgeon: Birdie Riddle, MD;  Location: [facility];  Service: Cardiovascular;  Laterality: N/A;  . PERICARDIAL TAP N/A 06/11/2013   Procedure: PERICARDIAL TAP;  Surgeon: Birdie Riddle, MD;  Location: [facility];  Service: Cardiovascular;  Laterality: N/A;  . PERMANENT PACEMAKER INSERTION N/A 05/27/2013   Procedure: PERMANENT PACEMAKER INSERTION;  Surgeon: Coralyn Mark, MD;  Location: [facility];  Service: Cardiovascular;  Laterality: N/A;  .  TEMPORARY PACEMAKER INSERTION N/A 05/27/2013   Procedure: TEMPORARY PACEMAKER INSERTION;  Surgeon: Birdie Riddle, MD;  Location: [facility];  Service: Cardiovascular;  Laterality: N/A;  . VARICOSE VEIN SURGERY Bilateral ~ 2002     OB History   No obstetric history on file.     Family History  Problem Relation Age of Onset  . Cancer Father        lung    Social History   Tobacco Use  . Smoking status: Never Smoker  . Smokeless tobacco: Never Used  Vaping Use  . Vaping Use: Never used  Substance Use Topics  . Alcohol use: Yes     Alcohol/week: 14.0 standard drinks    Types: 7 Glasses of wine, 7 Cans of beer per week    Comment: 2019: wine in the evening   . Drug use: No    Home Medications Prior to Admission medications   Medication Sig Start Date End Date Taking? Authorizing Provider  acetaminophen (TYLENOL) 325 MG tablet Take 325 mg by mouth every 6 (six) hours as needed for mild pain.    [provider]  amLODipine (NORVASC) 5 MG tablet TAKE 1 TABLET BY MOUTH EVERY DAY 02/04/20   Burchette, Alinda Sierras, MD  beta carotene w/minerals (OCUVITE) tablet Take 1 tablet by mouth daily.    [provider]  dextromethorphan-guaiFENesin (MUCINEX DM) 30-600 MG 12hr tablet Take 1 tablet by mouth 2 (two) times daily as needed for cough.    [provider]  donepezil (ARICEPT) 5 MG tablet TAKE 1 TABLET(5 MG) BY MOUTH AT BEDTIME 03/01/20   Burchette, Alinda Sierras, MD  loratadine (CLARITIN) 10 MG tablet Take 10 mg by mouth daily as needed for allergies.     [provider]  sertraline (ZOLOFT) 50 MG tablet TAKE 1 TABLET(50 MG) BY MOUTH DAILY 10/05/16   Burchette, Alinda Sierras, MD  thiamine 100 MG tablet Take 1 tablet (100 mg total) by mouth daily. 04/08/20 05/08/20  Mylik Pro, DO  TURMERIC PO Take 1 capsule by mouth daily.     [provider]    Allergies    Codeine, Morphine and related, and Other  Review of Systems   Review of Systems  Constitutional: Negative for chills and fever.  HENT: Negative for ear pain, hearing loss, sore throat and tinnitus.   Eyes: Negative for pain and visual disturbance.  Respiratory: Negative for cough and shortness of breath.   Cardiovascular: Negative for chest pain, palpitations and syncope.  Gastrointestinal: Negative for abdominal pain, blood in stool, diarrhea, nausea and vomiting.  Genitourinary: Negative for dysuria and hematuria.  Musculoskeletal: Negative for arthralgias and back pain.  Skin: Negative for color change and rash.  Neurological:  Positive for dizziness. Negative for seizures, syncope, weakness and headaches.  All other systems reviewed and are negative.   Physical Exam Updated Vital Signs  ED Triage Vitals  Enc Vitals Group     BP 04/07/20 1730 (!) 161/85     Pulse Rate 04/07/20 1730 81     Resp 04/07/20 1731 18     Temp 04/07/20 1731 98.3 F (36.8 C)     Temp Source 04/07/20 1731 Oral     SpO2 04/07/20 1730 94 %     Weight 04/07/20 1732 180 lb (81.6 kg)     Height 04/07/20 1732 _0  (1.626 m)     Head Circumference --      Peak Flow --      Pain Score  04/07/20 1732 0     Pain Loc --      Pain Edu? --      Excl. in [place]? --     Physical Exam Vitals and nursing note reviewed.  Constitutional:      General: She is not in acute distress.    Appearance: She is well-developed. She is not ill-appearing.  HENT:     Head: Normocephalic and atraumatic.     Right Ear: Tympanic membrane normal.     Left Ear: Tympanic membrane normal.     Nose: Nose normal.     Mouth/Throat:     Mouth: Mucous membranes are moist.  Eyes:     Extraocular Movements: Extraocular movements intact.     Conjunctiva/sclera: Conjunctivae normal.     Pupils: Pupils are equal, round, and reactive to light.  Cardiovascular:     Rate and Rhythm: Normal rate and regular rhythm.     Pulses: Normal pulses.     Heart sounds: Normal heart sounds. No murmur heard.   Pulmonary:     Effort: Pulmonary effort is normal. No respiratory distress.     Breath sounds: Normal breath sounds.  Abdominal:     Palpations: Abdomen is soft.     Tenderness: There is no abdominal tenderness.  Musculoskeletal:        General: No tenderness. Normal range of motion.     Cervical back: Normal range of motion and neck supple.  Skin:    General: Skin is warm and dry.  Neurological:     General: No focal deficit present.     Mental Status: She is alert and oriented to person, place, and time.     Cranial Nerves: No cranial nerve deficit.     Sensory: No  sensory deficit.     Motor: No weakness.     Coordination: Coordination normal.     Comments: Overall steady gait unassisted, 5+ out of 5 strength throughout, normal sensation, no drift, normal finger-to-nose finger, normal speech     ED Results / Procedures / Treatments   Labs (all labs ordered are listed, but only abnormal results are displayed) Labs Reviewed  CBC WITH DIFFERENTIAL/PLATELET - Abnormal; Notable for the following components:      Result Value   Hemoglobin 15.4 (*)    MCV 100.2 (*)    MCH 34.7 (*)    All other components within normal limits  BASIC METABOLIC PANEL - Abnormal; Notable for the following components:   Sodium 134 (*)    CO2 21 (*)    Glucose, Bld 109 (*)    GFR, Estimated 56 (*)    All other components within normal limits  URINALYSIS, ROUTINE W REFLEX MICROSCOPIC - Abnormal; Notable for the following components:   Ketones, ur 5 (*)    All other components within normal limits  CBG MONITORING, ED - Abnormal; Notable for the following components:   Glucose-Capillary 109 (*)    All other components within normal limits  HEPATIC FUNCTION PANEL    EKG EKG Interpretation  Date/Time:  Wednesday April 07 2020 17:35:04 EST Ventricular Rate:  79 PR Interval:    QRS Duration: 133 QT Interval:  419 QTC Calculation: 481 R Axis:   -64 Text Interpretation: Sinus rhythm Left bundle branch block Confirmed by Lennice Sites [phone]) on 04/07/2020 5:37:48 PM   Radiology CT Angio Head W or Wo Contrast  Result Date: 04/07/2020 CLINICAL DATA:  Dizziness EXAM: CT ANGIOGRAPHY HEAD AND NECK TECHNIQUE: Multidetector CT imaging  of the head and neck was performed using the standard protocol during bolus administration of intravenous contrast. Multiplanar CT image reconstructions and MIPs were obtained to evaluate the vascular anatomy. Carotid stenosis measurements (when applicable) are obtained utilizing NASCET criteria, using the distal internal carotid diameter as  the denominator. CONTRAST:  40m OMNIPAQUE IOHEXOL 350 MG/ML SOLN COMPARISON:  None. FINDINGS: CT HEAD FINDINGS Brain: There is no mass, hemorrhage or extra-axial collection. There is generalized atrophy without lobar predilection. There is hypoattenuation of the periventricular white matter, most commonly indicating chronic ischemic microangiopathy. Skull: Multiple lucent lesions near the skull vertex. Diffusely mottled appearance of the calvarium. Sinuses/Orbits: No fluid levels or advanced mucosal thickening of the visualized paranasal sinuses. No mastoid or middle ear effusion. The orbits are normal. CTA NECK FINDINGS SKELETON: The bones are osteopenic. OTHER NECK: Normal pharynx, larynx and major salivary glands. No cervical lymphadenopathy. Unremarkable thyroid gland. UPPER CHEST: No pneumothorax or pleural effusion. No nodules or masses. AORTIC ARCH: There is calcific atherosclerosis of the aortic arch. There is no aneurysm, dissection or hemodynamically significant stenosis of the visualized portion of the aorta. Conventional 3 vessel aortic branching pattern. The visualized proximal subclavian arteries are widely patent. RIGHT CAROTID SYSTEM: No dissection, occlusion or aneurysm. There is mixed density atherosclerosis extending into the proximal ICA, resulting in less than 50% stenosis. LEFT CAROTID SYSTEM: No dissection, occlusion or aneurysm. There is mixed density atherosclerosis extending into the proximal ICA, resulting in less than 50% stenosis. VERTEBRAL ARTERIES: Left dominant configuration. There is no contrast enhancement of the right V1 segment or proximal P2 segment. The left vertebral artery is normal. There is opacification of the right vertebral artery beginning at the distal V2 segment. CTA HEAD FINDINGS POSTERIOR CIRCULATION: --Vertebral arteries: Normal V4 segments. --Inferior cerebellar arteries: Normal. --Basilar artery: Normal. --Superior cerebellar arteries: Normal. --Posterior  cerebral arteries (PCA): Normal. ANTERIOR CIRCULATION: --Intracranial internal carotid arteries: Atherosclerotic calcification of the internal carotid arteries at the skull base without hemodynamically significant stenosis. --Anterior cerebral arteries (ACA): Normal. Both A1 segments are present. Patent anterior communicating artery (a-comm). --Middle cerebral arteries (MCA): Normal. VENOUS SINUSES: As permitted by contrast timing, patent. ANATOMIC VARIANTS: Fetal origins of both posterior cerebral arteries. Diminutive left A1 segment. Review of the MIP images confirms the above findings. IMPRESSION: 1. No intracranial arterial occlusion or high-grade stenosis. 2. 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 3. Multiple lucent lesions near the skull vertex and mottled appearance of the calvarium, concerning for multiple myeloma. 4. Bilateral carotid bifurcation atherosclerosis without hemodynamically significant stenosis by NASCET criteria. Aortic Atherosclerosis (ICD10-I70.0). Electronically Signed   By: KUlyses JarredM.D.   On: 04/07/2020 23:35   CT Angio Neck W and/or Wo Contrast  Result Date: 04/07/2020 CLINICAL DATA:  Dizziness EXAM: CT ANGIOGRAPHY HEAD AND NECK TECHNIQUE: Multidetector CT imaging of the head and neck was performed using the standard protocol during bolus administration of intravenous contrast. Multiplanar CT image reconstructions and MIPs were obtained to evaluate the vascular anatomy. Carotid stenosis measurements (when applicable) are obtained utilizing NASCET criteria, using the distal internal carotid diameter as the denominator. CONTRAST:  836mOMNIPAQUE IOHEXOL 350 MG/ML SOLN COMPARISON:  None. FINDINGS: CT HEAD FINDINGS Brain: There is no mass, hemorrhage or extra-axial collection. There is generalized atrophy without lobar predilection. There is hypoattenuation of the periventricular white matter, most  commonly indicating chronic ischemic microangiopathy. Skull: Multiple lucent lesions near the skull vertex. Diffusely mottled appearance  of the calvarium. Sinuses/Orbits: No fluid levels or advanced mucosal thickening of the visualized paranasal sinuses. No mastoid or middle ear effusion. The orbits are normal. CTA NECK FINDINGS SKELETON: The bones are osteopenic. OTHER NECK: Normal pharynx, larynx and major salivary glands. No cervical lymphadenopathy. Unremarkable thyroid gland. UPPER CHEST: No pneumothorax or pleural effusion. No nodules or masses. AORTIC ARCH: There is calcific atherosclerosis of the aortic arch. There is no aneurysm, dissection or hemodynamically significant stenosis of the visualized portion of the aorta. Conventional 3 vessel aortic branching pattern. The visualized proximal subclavian arteries are widely patent. RIGHT CAROTID SYSTEM: No dissection, occlusion or aneurysm. There is mixed density atherosclerosis extending into the proximal ICA, resulting in less than 50% stenosis. LEFT CAROTID SYSTEM: No dissection, occlusion or aneurysm. There is mixed density atherosclerosis extending into the proximal ICA, resulting in less than 50% stenosis. VERTEBRAL ARTERIES: Left dominant configuration. There is no contrast enhancement of the right V1 segment or proximal P2 segment. The left vertebral artery is normal. There is opacification of the right vertebral artery beginning at the distal V2 segment. CTA HEAD FINDINGS POSTERIOR CIRCULATION: --Vertebral arteries: Normal V4 segments. --Inferior cerebellar arteries: Normal. --Basilar artery: Normal. --Superior cerebellar arteries: Normal. --Posterior cerebral arteries (PCA): Normal. ANTERIOR CIRCULATION: --Intracranial internal carotid arteries: Atherosclerotic calcification of the internal carotid arteries at the skull base without hemodynamically significant stenosis. --Anterior cerebral arteries (ACA): Normal. Both A1 segments are present. Patent  anterior communicating artery (a-comm). --Middle cerebral arteries (MCA): Normal. VENOUS SINUSES: As permitted by contrast timing, patent. ANATOMIC VARIANTS: Fetal origins of both posterior cerebral arteries. Diminutive left A1 segment. Review of the MIP images confirms the above findings. IMPRESSION: 1. No intracranial arterial occlusion or high-grade stenosis. 2. 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 3. Multiple lucent lesions near the skull vertex and mottled appearance of the calvarium, concerning for multiple myeloma. 4. Bilateral carotid bifurcation atherosclerosis without hemodynamically significant stenosis by NASCET criteria. Aortic Atherosclerosis (ICD10-I70.0). Electronically Signed   By: Ulyses Jarred M.D.   On: 04/07/2020 23:35    Procedures Procedures (including critical care time)  Medications Ordered in ED Medications  thiamine (B-1) injection 100 mg (has no administration in time range)  sodium chloride 0.9 % bolus 1,000 mL (0 mLs Intravenous Stopped 04/07/20 1952)  iohexol (OMNIPAQUE) 350 MG/ML injection 80 mL (80 mLs Intravenous Contrast Given 04/07/20 2310)    ED Course  I have reviewed the triage vital signs and the nursing notes.  Pertinent labs & imaging results that were available during my care of the patient were reviewed by me and considered in my medical decision making (see chart for details).    MDM Rules/Calculators/A&P                          ROZINA POINTER is an 86 year old female with history of hypertension, CAD who presents the ED with dizziness.  Patient states had a brief episode of dizziness when standing up today.  Did not fall or hit her head.  No loss of consciousness.  No chest pain, no shortness of breath.  Has maybe had some gait issues recently but uses a cane/walker at times.  She is neurologically intact on exam.  She had positive orthostatics.  Possibly some  dehydration.  Fluid bolus to be given.  She is able to walk without much issues unassisted in the ED.  We will get  basic labs and CTA of head and neck given that she is had some of the symptoms.  Overall I suspect possibly some dehydration and hypovolemia.  No infectious symptoms.  Urinalysis negative for infection.  No significant leukocytosis, anemia, electrolyte abnormality.  Hemoglobin little bit elevated from baseline and some ketones in the urine possibly suspect some dehydration.  Feels better after fluid bolus.  If CTA head and neck is unremarkable suspect that patient will be safely discharged home.  Have low suspicion for stroke.  CT head and neck overall unremarkable.  No arterial occlusion or high-grade stenosis.  Does have occlusion of the right vertebral artery V1 but does have distal reconstitution.  Some lucent lesions on the skull that could be concerning for multiple myeloma we will have her follow-up with her primary care doctor about that.  Talked with her son on the phone and went over results.  She is a fairly heavy alcohol drinker.  We will start her on thiamine and have her follow-up with primary care.  Suspect likely some orthostatic symptoms from poor nutrition and dehydration.  Possibly some balance issues in the setting of chronic alcohol use.  Discharged in ED in good condition.  Understands return precautions.  This chart was dictated using voice recognition software.  Despite best efforts to proofread,  errors can occur which can change the documentation meaning.    Final Clinical Impression(s) / ED Diagnoses Final diagnoses:  Dizziness    Rx / DC Orders ED Discharge Orders         Ordered    thiamine 100 MG tablet  Daily        04/08/20 0000           Lennice Sites, DO 04/08/20 0002

## 2020-04-07 NOTE — ED Notes (Signed)
Son asked to be called if needed, [place], [phone]

## 2020-04-07 NOTE — ED Notes (Signed)
Pt is in CT scan at this time.

## 2020-04-07 NOTE — ED Triage Notes (Signed)
Pt bib EMS from [facility]. Pt c/o dizziness since waking up today and she fell onto her floor. Pt denies any injuries and/or pain at this time. Pt denies dizziness at this time, states she gets worse upon standing. Pt alert to person, place, and situation on arrival. Pt denies pain or weakness at this time. VSS.

## 2020-04-07 NOTE — ED Notes (Signed)
Pt to CT scan via stretcher.

## 2020-04-08 DIAGNOSIS — R531 Weakness: Secondary | ICD-10-CM | POA: Diagnosis not present

## 2020-04-08 DIAGNOSIS — Z7401 Bed confinement status: Secondary | ICD-10-CM | POA: Diagnosis not present

## 2020-04-08 DIAGNOSIS — M2550 Pain in unspecified joint: Secondary | ICD-10-CM | POA: Diagnosis not present

## 2020-04-08 DIAGNOSIS — R52 Pain, unspecified: Secondary | ICD-10-CM | POA: Diagnosis not present

## 2020-04-08 DIAGNOSIS — R42 Dizziness and giddiness: Secondary | ICD-10-CM | POA: Diagnosis not present

## 2020-04-08 DIAGNOSIS — I10 Essential (primary) hypertension: Secondary | ICD-10-CM | POA: Diagnosis not present

## 2020-04-08 MED ORDER — THIAMINE HCL 100 MG PO TABS: 100.0000 mg | ORAL_TABLET | Freq: Every day | ORAL | 1 refills | Status: AC

## 2020-04-08 NOTE — Discharge Instructions (Signed)
Follow-up with your primary care doctor regarding today's work-up.  Please discuss possibly a work-up for multiple myeloma given possible abnormal finding on your CT scan of your head.  Please cut back on your alcohol drinking.  Take multivitamin daily as well as thiamine.

## 2020-04-08 NOTE — ED Notes (Signed)
Called [facility] for transport home.  

## 2020-04-08 NOTE — ED Notes (Signed)
Pt assisted to restroom. Unsteady gait. Pt given a pillow and assisted with repositioning.

## 2020-04-08 NOTE — ED Notes (Signed)
Pts belongings placed in bag, D/C papers given to [facility].

## 2020-04-08 NOTE — ED Notes (Signed)
Pt assisted with repositioning

## 2020-04-09 ENCOUNTER — Ambulatory Visit (INDEPENDENT_AMBULATORY_CARE_PROVIDER_SITE_OTHER): Payer: Medicare Other

## 2020-04-09 DIAGNOSIS — I442 Atrioventricular block, complete: Secondary | ICD-10-CM | POA: Diagnosis not present

## 2020-04-11 LAB — CUP PACEART REMOTE DEVICE CHECK
Battery Impedance: 815 Ohm
Battery Remaining Longevity: 64 mo
Battery Voltage: 2.78 V
Brady Statistic AP VP Percent: 1 %
Brady Statistic AP VS Percent: 0 %
Brady Statistic AS VP Percent: 98 %
Brady Statistic AS VS Percent: 1 %
Date Time Interrogation Session: 20211203162129
Implantable Lead Implant Date: 20150120
Implantable Lead Implant Date: 20150120
Implantable Lead Location: 753859
Implantable Lead Location: 753860
Implantable Lead Model: 5076
Implantable Lead Model: 5076
Implantable Pulse Generator Implant Date: 20150120
Lead Channel Impedance Value: 495 Ohm
Lead Channel Impedance Value: 510 Ohm
Lead Channel Pacing Threshold Amplitude: 0.5 V
Lead Channel Pacing Threshold Amplitude: 1.125 V
Lead Channel Pacing Threshold Pulse Width: 0.4 ms
Lead Channel Pacing Threshold Pulse Width: 0.4 ms
Lead Channel Setting Pacing Amplitude: 2 V
Lead Channel Setting Pacing Amplitude: 2.5 V
Lead Channel Setting Pacing Pulse Width: 0.4 ms
Lead Channel Setting Sensing Sensitivity: 2.8 mV

## 2020-04-16 DIAGNOSIS — Z1159 Encounter for screening for other viral diseases: Secondary | ICD-10-CM | POA: Diagnosis not present

## 2020-04-16 DIAGNOSIS — Z20828 Contact with and (suspected) exposure to other viral communicable diseases: Secondary | ICD-10-CM | POA: Diagnosis not present

## 2020-04-20 ENCOUNTER — Other Ambulatory Visit: Payer: Self-pay

## 2020-04-20 ENCOUNTER — Encounter: Payer: Self-pay | Admitting: Family Medicine

## 2020-04-20 ENCOUNTER — Ambulatory Visit (INDEPENDENT_AMBULATORY_CARE_PROVIDER_SITE_OTHER): Payer: Medicare Other | Admitting: Family Medicine

## 2020-04-20 ENCOUNTER — Other Ambulatory Visit: Payer: Self-pay | Admitting: Family Medicine

## 2020-04-20 VITALS — BP 112/64 | HR 85 | Temp 97.5°F | Ht 64.0 in | Wt 175.9 lb

## 2020-04-20 DIAGNOSIS — R930 Abnormal findings on diagnostic imaging of skull and head, not elsewhere classified: Secondary | ICD-10-CM

## 2020-04-20 DIAGNOSIS — R600 Localized edema: Secondary | ICD-10-CM

## 2020-04-20 DIAGNOSIS — Z1159 Encounter for screening for other viral diseases: Secondary | ICD-10-CM | POA: Diagnosis not present

## 2020-04-20 DIAGNOSIS — Z20828 Contact with and (suspected) exposure to other viral communicable diseases: Secondary | ICD-10-CM | POA: Diagnosis not present

## 2020-04-20 MED ORDER — FUROSEMIDE 20 MG PO TABS: ORAL_TABLET | ORAL | 0 refills | Status: DC

## 2020-04-20 NOTE — Progress Notes (Signed)
Established Patient Office Visit  Subjective:  Patient ID: Theresa Norton, female    DOB: July 22, 1928  Age: 86 y.o. MRN: [id number]  CC:  Chief Complaint  Patient presents with  . Hospitalization Follow-up    HPI Theresa Norton presents for ER follow-up.  She had apparently been complaining some dizziness and reportedly had fallen and hit her head the day prior to ER evaluation.  ER notes and labs and imaging reviewed.  She had labs including urinalysis, CBC, basic metabolic panel, hepatic panel.  Electrolytes stable.  Sodium 134.  Hemoglobin 15.4.  Spell she may have been slightly dehydrated though her BUN and creatinine were stable.  She was given some IV fluids and they obtained imaging including CT angiogram of the head and neck.  There was no evidence of any mass or hemorrhage.  She did have multiple lucent lesions near the skull vertex with diffusely mottled appearance of the calvarium.  Also noted occlusion of the right vertebral artery concern was for possible multimyeloma based on skull appearance as above.  She has not complained any recent low back pain.  No fevers or night sweats.  There was no evidence for acute renal failure or hypercalcemia by labs.  Resides in assisted living.  She has had attempts at PT and OT assistance in the past but she has declined these.  Patient states her appetite is good.  Denies any bony pains.  No night sweats.  Son has noticed he has some edema especially in her feet and ankles bilaterally past several days.  She has not complained of any dyspnea or orthopnea.  Past Medical History:  Diagnosis Date  . Allergy   . Anemia   . Arthritis    "in my fingers" (07/08/2013)  . CAD (coronary artery disease)    a. 05/2013 nonobs dzs by cath.  . Colon polyps   . Groin hematoma    a. 05/2013 R groin hematoma post-cath - u/s 05/31/13 large 7.49 cm r inguinal hematoma extending into thigh, no psa or avf.  . H/O hiatal hernia   . Heart block AV second  degree    a. s/p MDT Addapta dual chamber pacemaker 05/2013 by Dr Rayann Heman.  . Hypertension   . On home oxygen therapy    "2L maybe 12h/day" (07/08/2013)  . Pacemaker   . Pacemaker   . PAF (paroxysmal atrial fibrillation) ([place])    a. Found post-pacer 05/2013->Xarelto added;  b. 05/2013 Echo: EF 60-65%, mild LVH, nl wall motion w/o rwma.    Past Surgical History:  Procedure Laterality Date  . BREAST CYST EXCISION Right 1959; 1970's  . BUNIONECTOMY Bilateral 1967  . CARDIAC CATHETERIZATION    . CATARACT EXTRACTION W/ INTRAOCULAR LENS  IMPLANT, BILATERAL Bilateral   . DILATION AND CURETTAGE OF UTERUS    . INSERT / REPLACE / REMOVE PACEMAKER  05/2013   MDT [id number] implanted by Dr Rayann Heman for heart block  . INSERT / REPLACE / REMOVE PACEMAKER  06/2013   Right ventricular lead perforation with tamponade  . LEAD REVISION N/A 06/13/2013   Procedure: LEAD REVISION;  Surgeon: Coralyn Mark, MD;  Location: [facility];  Service: Cardiovascular;  Laterality: N/A;  . LEFT HEART CATHETERIZATION WITH CORONARY ANGIOGRAM N/A 05/27/2013   Procedure: LEFT HEART CATHETERIZATION WITH CORONARY ANGIOGRAM;  Surgeon: Birdie Riddle, MD;  Location: [facility];  Service: Cardiovascular;  Laterality: N/A;  . PERICARDIAL TAP N/A 06/11/2013   Procedure: PERICARDIAL TAP;  Surgeon: Vicenta Aly  Merrilee Jansky, MD;  Location: [facility];  Service: Cardiovascular;  Laterality: N/A;  . PERMANENT PACEMAKER INSERTION N/A 05/27/2013   Procedure: PERMANENT PACEMAKER INSERTION;  Surgeon: Coralyn Mark, MD;  Location: [facility];  Service: Cardiovascular;  Laterality: N/A;  . TEMPORARY PACEMAKER INSERTION N/A 05/27/2013   Procedure: TEMPORARY PACEMAKER INSERTION;  Surgeon: Birdie Riddle, MD;  Location: [facility];  Service: Cardiovascular;  Laterality: N/A;  . VARICOSE VEIN SURGERY Bilateral ~ 2002    Family History  Problem Relation Age of Onset  . Cancer Father        lung    Social History   Socioeconomic History  . Marital status:  Widowed    Spouse name: Not on file  . Number of children: Not on file  . Years of education: Not on file  . Highest education level: Not on file  Occupational History  . Not on file  Tobacco Use  . Smoking status: Never Smoker  . Smokeless tobacco: Never Used  Vaping Use  . Vaping Use: Never used  Substance and Sexual Activity  . Alcohol use: Yes    Alcohol/week: 14.0 standard drinks    Types: 7 Glasses of wine, 7 Cans of beer per week    Comment: 2019: wine in the evening   . Drug use: No  . Sexual activity: Never  Other Topics Concern  . Not on file  Social History Narrative   Living at [facility] Strain: Low Risk   . Difficulty of Paying Living Expenses: Not hard at all  Food Insecurity: No Food Insecurity  . Worried About Charity fundraiser in the Last Year: Never true  . Ran Out of Food in the Last Year: Never true  Transportation Needs: No Transportation Needs  . Lack of Transportation (Medical): No  . Lack of Transportation (Non-Medical): No  Physical Activity: Insufficiently Active  . Days of Exercise per Week: 7 days  . Minutes of Exercise per Session: 10 min  Stress: No Stress Concern Present  . Feeling of Stress : Only a little  Social Connections: Unknown  . Frequency of Communication with Friends and Family: Not on file  . Frequency of Social Gatherings with Friends and Family: Twice a week  . Attends Religious Services: Not on file  . Active Member of Clubs or Organizations: Not on file  . Attends Archivist Meetings: Not on file  . Marital Status: Not on file  Intimate Partner Violence: Not on file    Outpatient Medications Prior to Visit  Medication Sig Dispense Refill  . acetaminophen (TYLENOL) 325 MG tablet Take 325 mg by mouth every 6 (six) hours as needed for mild pain.    Marland Kitchen amLODipine (NORVASC) 5 MG tablet TAKE 1 TABLET BY MOUTH EVERY DAY 90 tablet 0  . beta carotene  w/minerals (OCUVITE) tablet Take 1 tablet by mouth daily.    Marland Kitchen dextromethorphan-guaiFENesin (MUCINEX DM) 30-600 MG 12hr tablet Take 1 tablet by mouth 2 (two) times daily as needed for cough.    . donepezil (ARICEPT) 5 MG tablet TAKE 1 TABLET(5 MG) BY MOUTH AT BEDTIME 30 tablet 1  . loratadine (CLARITIN) 10 MG tablet Take 10 mg by mouth daily as needed for allergies.     . Multiple Vitamin (MULTIVITAMIN ADULT PO) Take by mouth.    . sertraline (ZOLOFT) 50 MG tablet TAKE 1 TABLET(50 MG) BY MOUTH DAILY 90 tablet  0  . thiamine 100 MG tablet Take 1 tablet (100 mg total) by mouth daily. 30 tablet 1  . Thiamine HCl (VITAMIN B-1 PO) Take 99 mcg by mouth daily.    . TURMERIC PO Take 1 capsule by mouth daily.      No facility-administered medications prior to visit.    Allergies  Allergen Reactions  . Codeine     GI upset  . Morphine And Related Other (See Comments)    GI upset GI upset  . Other Other (See Comments)    Pain killers, unknown    ROS Review of Systems  Constitutional: Negative for appetite change, chills, fever and unexpected weight change.  Respiratory: Negative for cough.   Cardiovascular: Negative for chest pain.  Gastrointestinal: Negative for abdominal pain.  Genitourinary: Negative for dysuria.  Neurological: Negative for headaches.      Objective:    Physical Exam Vitals reviewed.  Constitutional:      Appearance: Normal appearance.  Cardiovascular:     Rate and Rhythm: Normal rate and regular rhythm.  Pulmonary:     Effort: Pulmonary effort is normal.     Breath sounds: Normal breath sounds.  Musculoskeletal:     Right lower leg: Edema present.     Left lower leg: Edema present.     Comments: She has 1+ pitting edema feet ankles and lower legs bilaterally  Neurological:     Mental Status: She is alert.     BP 112/64 (BP Location: Left Arm, Patient Position: Sitting, Cuff Size: Large)   Pulse 85   Temp (!) 97.5 F (36.4 C) (Oral)   Ht 5\' 4"  (1.626  m)   Wt 175 lb 14.4 oz (79.8 kg)   SpO2 93%   BMI 30.19 kg/m  Wt Readings from Last 3 Encounters:  04/20/20 175 lb 14.4 oz (79.8 kg)  04/07/20 180 lb (81.6 kg)  12/19/19 171 lb (77.6 kg)     Health Maintenance Due  Topic Date Due  . DEXA SCAN  Never done  . PNA vac Low Risk Adult (1 of 2 - PCV13) Never done  . TETANUS/TDAP  04/11/2007  . COVID-19 Vaccine (3 - Moderna risk 4-dose series) 04/01/2020    There are no preventive care reminders to display for this patient.  Lab Results  Component Value Date   TSH 1.11 09/14/2015   Lab Results  Component Value Date   WBC 7.3 04/07/2020   HGB 15.4 (H) 04/07/2020   HCT 44.5 04/07/2020   MCV 100.2 (H) 04/07/2020   PLT 242 04/07/2020   Lab Results  Component Value Date   NA 134 (L) 04/07/2020   K 4.1 04/07/2020   CO2 21 (L) 04/07/2020   GLUCOSE 109 (H) 04/07/2020   BUN 11 04/07/2020   CREATININE 0.97 04/07/2020   BILITOT 0.8 04/07/2020   ALKPHOS 87 04/07/2020   AST 37 04/07/2020   ALT 24 04/07/2020   PROT 7.5 04/07/2020   ALBUMIN 3.8 04/07/2020   CALCIUM 9.1 04/07/2020   ANIONGAP 13 04/07/2020   GFR 62.08 03/11/2018   Lab Results  Component Value Date   CHOL 232 (H) 04/17/2012   Lab Results  Component Value Date   HDL 96.80 04/17/2012   No results found for: [facility] Lab Results  Component Value Date   TRIG 136.0 04/17/2012   Lab Results  Component Value Date   CHOLHDL 2 04/17/2012   No results found for: HGBA1C    Assessment & Plan:   Problem  List Items Addressed This Visit   None   Visit Diagnoses    Abnormal finding of skull or head x-ray    -  Primary   Relevant Orders   Immunofixation Electrophoresis, Serum   IgG, IgA, IgM   IGE   Kappa/lambda light chains   Protein Electrophoresis, Serum    Patient has had some recent dizziness with fall as above.  CT revealed no acute hemorrhage but she did have abnormal lucency is on skull with concern for multimyeloma.  She did not have any evidence  for hypercalcemia or acute renal worsening by recent labs.  Also does not describe any recent bony pain, low back pain, weight loss, etc. obtaining labs as above.  We discussed repeat trial of PT/OT but she has declined these services in the past  We discussed trial of low-dose Lasix 20 mg daily for the next 3 to 4 days and try leaving off for her edema issues.  Meds ordered this encounter  Medications  . DISCONTD: furosemide (LASIX) 20 MG tablet    Sig: Take one tablet daily as needed for leg edema    Dispense:  30 tablet    Refill:  0    Follow-up: No follow-ups on file.    Carolann Littler, MD

## 2020-04-20 NOTE — Patient Instructions (Signed)

## 2020-04-20 NOTE — Progress Notes (Signed)
Remote pacemaker transmission.   

## 2020-04-22 LAB — KAPPA/LAMBDA LIGHT CHAINS
Kappa free light chain: 83.3 mg/L — ABNORMAL HIGH (ref 3.3–19.4)
Kappa:Lambda Ratio: 5.02 — ABNORMAL HIGH (ref 0.26–1.65)
Lambda Free Lght Chn: 16.6 mg/L (ref 5.7–26.3)

## 2020-04-22 LAB — IMMUNOFIXATION ELECTROPHORESIS
IgG (Immunoglobin G), Serum: 1247 mg/dL (ref 600–1540)
IgM, Serum: 178 mg/dL (ref 50–300)
Immunofix Electr Int: NOT DETECTED
Immunoglobulin A: 220 mg/dL (ref 70–320)

## 2020-04-22 LAB — PROTEIN ELECTROPHORESIS, SERUM
Albumin ELP: 3.8 g/dL (ref 3.8–4.8)
Alpha 1: 0.4 g/dL — ABNORMAL HIGH (ref 0.2–0.3)
Alpha 2: 0.8 g/dL (ref 0.5–0.9)
Beta 2: 0.4 g/dL (ref 0.2–0.5)
Beta Globulin: 0.4 g/dL (ref 0.4–0.6)
Gamma Globulin: 1.2 g/dL (ref 0.8–1.7)
Total Protein: 6.9 g/dL (ref 6.1–8.1)

## 2020-04-22 LAB — IGE: IgE (Immunoglobulin E), Serum: 1299 kU/L — ABNORMAL HIGH (ref <=114)

## 2020-04-23 DIAGNOSIS — Z20828 Contact with and (suspected) exposure to other viral communicable diseases: Secondary | ICD-10-CM | POA: Diagnosis not present

## 2020-04-23 DIAGNOSIS — Z1159 Encounter for screening for other viral diseases: Secondary | ICD-10-CM | POA: Diagnosis not present

## 2020-04-24 NOTE — Addendum Note (Signed)
Addended by: Eulas Post on: 04/24/2020 02:05 PM   Modules accepted: Orders

## 2020-04-26 ENCOUNTER — Telehealth: Payer: Self-pay | Admitting: Hematology

## 2020-04-26 NOTE — Telephone Encounter (Signed)
Received a new pt referral from Dr.Burchette for recent CT which showed multiple lucent areas of skull. Increased Kappa/Lambda free light chain ratio. [name] has been scheduled to see Dr. Irene Limbo on 1/3 at 11am. Appt date and time has been given to the pt's son.

## 2020-04-27 DIAGNOSIS — Z1159 Encounter for screening for other viral diseases: Secondary | ICD-10-CM | POA: Diagnosis not present

## 2020-04-27 DIAGNOSIS — Z20828 Contact with and (suspected) exposure to other viral communicable diseases: Secondary | ICD-10-CM | POA: Diagnosis not present

## 2020-05-04 DIAGNOSIS — Z20828 Contact with and (suspected) exposure to other viral communicable diseases: Secondary | ICD-10-CM | POA: Diagnosis not present

## 2020-05-04 DIAGNOSIS — Z1159 Encounter for screening for other viral diseases: Secondary | ICD-10-CM | POA: Diagnosis not present

## 2020-05-07 DIAGNOSIS — Z20828 Contact with and (suspected) exposure to other viral communicable diseases: Secondary | ICD-10-CM | POA: Diagnosis not present

## 2020-05-07 DIAGNOSIS — Z1159 Encounter for screening for other viral diseases: Secondary | ICD-10-CM | POA: Diagnosis not present

## 2020-05-10 ENCOUNTER — Inpatient Hospital Stay: Payer: Medicare Other | Attending: Hematology | Admitting: Hematology

## 2020-05-10 ENCOUNTER — Other Ambulatory Visit: Payer: Self-pay

## 2020-05-10 ENCOUNTER — Telehealth: Payer: Self-pay | Admitting: Hematology

## 2020-05-10 ENCOUNTER — Inpatient Hospital Stay: Payer: Medicare Other

## 2020-05-10 VITALS — BP 179/98 | HR 83 | Temp 98.1°F | Resp 18 | Ht 64.0 in | Wt 172.5 lb

## 2020-05-10 DIAGNOSIS — Z79899 Other long term (current) drug therapy: Secondary | ICD-10-CM | POA: Insufficient documentation

## 2020-05-10 DIAGNOSIS — I480 Paroxysmal atrial fibrillation: Secondary | ICD-10-CM | POA: Insufficient documentation

## 2020-05-10 DIAGNOSIS — I10 Essential (primary) hypertension: Secondary | ICD-10-CM | POA: Insufficient documentation

## 2020-05-10 DIAGNOSIS — M899 Disorder of bone, unspecified: Secondary | ICD-10-CM

## 2020-05-10 DIAGNOSIS — Z950 Presence of cardiac pacemaker: Secondary | ICD-10-CM | POA: Diagnosis not present

## 2020-05-10 DIAGNOSIS — F32A Depression, unspecified: Secondary | ICD-10-CM | POA: Insufficient documentation

## 2020-05-10 DIAGNOSIS — E559 Vitamin D deficiency, unspecified: Secondary | ICD-10-CM | POA: Insufficient documentation

## 2020-05-10 DIAGNOSIS — M1990 Unspecified osteoarthritis, unspecified site: Secondary | ICD-10-CM | POA: Diagnosis not present

## 2020-05-10 DIAGNOSIS — I2510 Atherosclerotic heart disease of native coronary artery without angina pectoris: Secondary | ICD-10-CM | POA: Insufficient documentation

## 2020-05-10 DIAGNOSIS — C9000 Multiple myeloma not having achieved remission: Secondary | ICD-10-CM | POA: Diagnosis not present

## 2020-05-10 LAB — CBC WITH DIFFERENTIAL/PLATELET
Abs Immature Granulocytes: 0.04 10*3/uL (ref 0.00–0.07)
Basophils Absolute: 0.1 10*3/uL (ref 0.0–0.1)
Basophils Relative: 1 %
Eosinophils Absolute: 0.1 10*3/uL (ref 0.0–0.5)
Eosinophils Relative: 1 %
HCT: 43 % (ref 36.0–46.0)
Hemoglobin: 14.8 g/dL (ref 12.0–15.0)
Immature Granulocytes: 1 %
Lymphocytes Relative: 14 %
Lymphs Abs: 1.1 10*3/uL (ref 0.7–4.0)
MCH: 33.8 pg (ref 26.0–34.0)
MCHC: 34.4 g/dL (ref 30.0–36.0)
MCV: 98.2 fL (ref 80.0–100.0)
Monocytes Absolute: 0.7 10*3/uL (ref 0.1–1.0)
Monocytes Relative: 9 %
Neutro Abs: 5.8 10*3/uL (ref 1.7–7.7)
Neutrophils Relative %: 74 %
Platelets: 212 10*3/uL (ref 150–400)
RBC: 4.38 MIL/uL (ref 3.87–5.11)
RDW: 12.8 % (ref 11.5–15.5)
WBC: 7.8 10*3/uL (ref 4.0–10.5)
nRBC: 0 % (ref 0.0–0.2)

## 2020-05-10 LAB — CMP (CANCER CENTER ONLY)
ALT: 28 U/L (ref 0–44)
AST: 44 U/L — ABNORMAL HIGH (ref 15–41)
Albumin: 3.7 g/dL (ref 3.5–5.0)
Alkaline Phosphatase: 100 U/L (ref 38–126)
Anion gap: 10 (ref 5–15)
BUN: 10 mg/dL (ref 8–23)
CO2: 24 mmol/L (ref 22–32)
Calcium: 9.1 mg/dL (ref 8.9–10.3)
Chloride: 104 mmol/L (ref 98–111)
Creatinine: 0.81 mg/dL (ref 0.44–1.00)
GFR, Estimated: >60 mL/min (ref >60)
Glucose, Bld: 100 mg/dL — ABNORMAL HIGH (ref 70–99)
Potassium: 4.1 mmol/L (ref 3.5–5.1)
Sodium: 138 mmol/L (ref 135–145)
Total Bilirubin: 0.9 mg/dL (ref 0.3–1.2)
Total Protein: 7.7 g/dL (ref 6.5–8.1)

## 2020-05-10 LAB — LACTATE DEHYDROGENASE: LDH: 233 U/L — ABNORMAL HIGH (ref 98–192)

## 2020-05-10 NOTE — Progress Notes (Signed)
HEMATOLOGY/ONCOLOGY CONSULTATION NOTE  Date of Service: 05/10/2020  Patient Care Team: Eulas Post, MD as PCP - General (Family Medicine) [place] as Registered Nurse  CHIEF COMPLAINTS/PURPOSE OF CONSULTATION:  Lucent areas of skull/increased K/L light chain ratio  HISTORY OF PRESENTING ILLNESS:   Theresa Norton is a wonderful 87 y.o. female who has been referred to [place] by Dr. Elease Hashimoto for evaluation and management of concern for plasma cell disorder. Pt is accompanied today by her son. The pt reports that she is doing well overall.    The pt reports she was extremely dizzy and imbalanced prior to her 04/07/20 CT scan. The pt had a fall where she injured her head a few days before the onset of these symptoms. She denies any current dizziness. Pt notes some increased fatigue over the last few years. She feels that her retirement home is contributing to the feeling of "slowing down".   She is not currently on oxygen but continues to experience difficulty breathing. Pt notes that her Afib and heart disease have remained stable. The pt recently had left ankle swelling that resolved with diuretics. Her son feels that the pt has been experiencing depression, which the pt does not agree with.   Of note prior to the patient's visit today, pt has had CT Angio Head & Neck ([id number]) ([id number]) completed on 04/07/2020 with results revealing "1. No intracranial arterial occlusion or high-grade stenosis. 2. 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 3. Multiple lucent lesions near the skull vertex and mottled appearance of the calvarium, concerning for multiple myeloma. 4. Bilateral carotid bifurcation atherosclerosis without hemodynamically significant stenosis by NASCET criteria."   Most recent lab results (04/07/2020) of CBC w/diff & BMP is as follows: all values are WNL except for Hgb at 15.4,  MCV at 100.2, MCH at 34.7, Sodium at 134, CO2 at 21, Glucose at 109, GFR Est at 56. 04/20/2020 K/L light chains is as follows: Kappa free light chain at 83.3, Lambda free light chain at 16.6, K/L ratio at 5.02. 04/20/2020 Immunofixation Electrophoresis shows: No Monoclonal Protein Detected 04/20/2020 IgE at 1299  On review of systems, pt reports fatigue, SOB and denies dizziness, fevers, chills, new bone pain, headaches, neck pain, unexpected weight loss, calf pain and any other symptoms.   On PMHx the pt reports CAD, Pacemaker, PAF.  MEDICAL HISTORY:  Past Medical History:  Diagnosis Date  . Allergy   . Anemia   . Arthritis    "in my fingers" (07/08/2013)  . CAD (coronary artery disease)    a. 05/2013 nonobs dzs by cath.  . Colon polyps   . Groin hematoma    a. 05/2013 R groin hematoma post-cath - u/s 05/31/13 large 7.49 cm r inguinal hematoma extending into thigh, no psa or avf.  . H/O hiatal hernia   . Heart block AV second degree    a. s/p MDT Addapta dual chamber pacemaker 05/2013 by Dr Rayann Heman.  . Hypertension   . On home oxygen therapy    "2L maybe 12h/day" (07/08/2013)  . Pacemaker   . Pacemaker   . PAF (paroxysmal atrial fibrillation) ([place])    a. Found post-pacer 05/2013->Xarelto added;  b. 05/2013 Echo: EF 60-65%, mild LVH, nl wall motion w/o rwma.    SURGICAL HISTORY: Past Surgical History:  Procedure Laterality Date  . BREAST CYST EXCISION Right 1959; 1970's  . BUNIONECTOMY Bilateral 1967  .  CARDIAC CATHETERIZATION    . CATARACT EXTRACTION W/ INTRAOCULAR LENS  IMPLANT, BILATERAL Bilateral   . DILATION AND CURETTAGE OF UTERUS    . INSERT / REPLACE / REMOVE PACEMAKER  05/2013   MDT ADDRL1 implanted by Dr Rayann Heman for heart block  . INSERT / REPLACE / REMOVE PACEMAKER  06/2013   Right ventricular lead perforation with tamponade  . LEAD REVISION N/A 06/13/2013   Procedure: LEAD REVISION;  Surgeon: Coralyn Mark, MD;  Location: [facility] CATH LAB;  Service: Cardiovascular;  Laterality:  N/A;  . LEFT HEART CATHETERIZATION WITH CORONARY ANGIOGRAM N/A 05/27/2013   Procedure: LEFT HEART CATHETERIZATION WITH CORONARY ANGIOGRAM;  Surgeon: Birdie Riddle, MD;  Location: [facility] CATH LAB;  Service: Cardiovascular;  Laterality: N/A;  . PERICARDIAL TAP N/A 06/11/2013   Procedure: PERICARDIAL TAP;  Surgeon: Birdie Riddle, MD;  Location: [facility] CATH LAB;  Service: Cardiovascular;  Laterality: N/A;  . PERMANENT PACEMAKER INSERTION N/A 05/27/2013   Procedure: PERMANENT PACEMAKER INSERTION;  Surgeon: Coralyn Mark, MD;  Location: [facility] LAB;  Service: Cardiovascular;  Laterality: N/A;  . TEMPORARY PACEMAKER INSERTION N/A 05/27/2013   Procedure: TEMPORARY PACEMAKER INSERTION;  Surgeon: Birdie Riddle, MD;  Location: [facility] LAB;  Service: Cardiovascular;  Laterality: N/A;  . VARICOSE VEIN SURGERY Bilateral ~ 2002    SOCIAL HISTORY: Social History   Socioeconomic History  . Marital status: Widowed    Spouse name: Not on file  . Number of children: Not on file  . Years of education: Not on file  . Highest education level: Not on file  Occupational History  . Not on file  Tobacco Use  . Smoking status: Never Smoker  . Smokeless tobacco: Never Used  Vaping Use  . Vaping Use: Never used  Substance and Sexual Activity  . Alcohol use: Yes    Alcohol/week: 14.0 standard drinks    Types: 7 Glasses of wine, 7 Cans of beer per week    Comment: 2019: wine in the evening   . Drug use: No  . Sexual activity: Never  Other Topics Concern  . Not on file  Social History Narrative   Living at [place] Strain: Low Risk   . Difficulty of Paying Living Expenses: Not hard at all  Food Insecurity: No Food Insecurity  . Worried About Charity fundraiser in the Last Year: Never true  . Ran Out of Food in the Last Year: Never true  Transportation Needs: No Transportation Needs  . Lack of Transportation (Medical): No  . Lack of Transportation  (Non-Medical): No  Physical Activity: Insufficiently Active  . Days of Exercise per Week: 7 days  . Minutes of Exercise per Session: 10 min  Stress: No Stress Concern Present  . Feeling of Stress : Only a little  Social Connections: Unknown  . Frequency of Communication with Friends and Family: Not on file  . Frequency of Social Gatherings with Friends and Family: Twice a week  . Attends Religious Services: Not on file  . Active Member of Clubs or Organizations: Not on file  . Attends Archivist Meetings: Not on file  . Marital Status: Not on file  Intimate Partner Violence: Not on file    FAMILY HISTORY: Family History  Problem Relation Age of Onset  . Cancer Father        lung    ALLERGIES:  is allergic to codeine, morphine and related,  and other.  MEDICATIONS:  Current Outpatient Medications  Medication Sig Dispense Refill  . acetaminophen (TYLENOL) 325 MG tablet Take 325 mg by mouth every 6 (six) hours as needed for mild pain.    Marland Kitchen amLODipine (NORVASC) 5 MG tablet TAKE 1 TABLET BY MOUTH EVERY DAY 90 tablet 0  . beta carotene w/minerals (OCUVITE) tablet Take 1 tablet by mouth daily.    Marland Kitchen dextromethorphan-guaiFENesin (MUCINEX DM) 30-600 MG 12hr tablet Take 1 tablet by mouth 2 (two) times daily as needed for cough.    . donepezil (ARICEPT) 5 MG tablet TAKE 1 TABLET(5 MG) BY MOUTH AT BEDTIME 30 tablet 1  . furosemide (LASIX) 20 MG tablet TAKE 1 TABLET BY MOUTH DAILY AS NEEDED FOR LEG SWELLING 90 tablet 0  . loratadine (CLARITIN) 10 MG tablet Take 10 mg by mouth daily as needed for allergies.     . Multiple Vitamin (MULTIVITAMIN ADULT PO) Take by mouth.    . sertraline (ZOLOFT) 50 MG tablet TAKE 1 TABLET(50 MG) BY MOUTH DAILY 90 tablet 0  . Thiamine HCl (VITAMIN B-1 PO) Take 99 mcg by mouth daily.    . TURMERIC PO Take 1 capsule by mouth daily.      No current facility-administered medications for this visit.    REVIEW OF SYSTEMS:    10 Point review of Systems  was done is negative except as noted above.  PHYSICAL EXAMINATION: ECOG PERFORMANCE STATUS: 2 - Symptomatic, <50% confined to bed  . Vitals:   05/10/20 1113  BP: (!) 179/98  Pulse: 83  Resp: 18  Temp: 98.1 F (36.7 C)  SpO2: 97%   Filed Weights   05/10/20 1113  Weight: 172 lb 8 oz (78.2 kg)   .Body mass index is 29.61 kg/m.  Exam was given in a wheelchair   GENERAL:alert, in no acute distress and comfortable, sacroiliac discomfort with palpation. SKIN: no acute rashes, no significant lesions EYES: conjunctiva are pink and non-injected, sclera anicteric OROPHARYNX: MMM, no exudates, no oropharyngeal erythema or ulceration NECK: supple, no JVD LYMPH:  no palpable lymphadenopathy in the cervical, axillary or inguinal regions LUNGS: clear to auscultation b/l with normal respiratory effort HEART: regular rate & rhythm ABDOMEN:  normoactive bowel sounds , non tender, not distended. Extremity: no pedal edema PSYCH: alert & oriented x 3 with fluent speech NEURO: no focal motor/sensory deficits  LABORATORY DATA:  I have reviewed the data as listed  . CBC Latest Ref Rng & Units 04/07/2020 09/14/2015 03/02/2015  WBC 4.0 - 10.5 K/uL 7.3 9.1 7.5  Hemoglobin 12.0 - 15.0 g/dL 15.4(H) 12.3 12.9  Hematocrit 36.0 - 46.0 % 44.5 35.4 37.5  Platelets 150 - 400 K/uL 242 267 247    . CMP Latest Ref Rng & Units 04/20/2020 04/07/2020 03/11/2018  Glucose 70 - 99 mg/dL - 109(H) 95  BUN 8 - 23 mg/dL - 11 13  Creatinine 0.44 - 1.00 mg/dL - 0.97 0.91  Sodium 135 - 145 mmol/L - 134(L) 134(L)  Potassium 3.5 - 5.1 mmol/L - 4.1 4.6  Chloride 98 - 111 mmol/L - 100 98  CO2 22 - 32 mmol/L - 21(L) 26  Calcium 8.9 - 10.3 mg/dL - 9.1 9.3  Total Protein 6.1 - 8.1 g/dL 6.9 7.5 -  Total Bilirubin 0.3 - 1.2 mg/dL - 0.8 -  Alkaline Phos 38 - 126 U/L - 87 -  AST 15 - 41 U/L - 37 -  ALT 0 - 44 U/L - 24 -  RADIOGRAPHIC STUDIES: I have personally reviewed the radiological images as listed and agreed  with the findings in the report. No results found.  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 Mets vs myeloma vs metabolic bone disease. PLAN: -Discussed patient's most recent labs from 04/07/2020, all values are WNL except for Hgb at 15.4, MCV at 100.2, MCH at 34.7, Sodium at 134, CO2 at 21, Glucose at 109, GFR Est at 56. -Discussed 04/20/2020 IgE at 1299 -Discussed 04/20/2020 Immunofixation Electrophoresis shows: No Monoclonal Protein Detected -Discussed 04/20/2020 K/L light chains is as follows: Kappa free light chain at 83.3, Lambda free light chain at 16.6, K/L ratio at 5.02. -Discussed 04/07/2020 CT Angio Head & Neck ([id number]) ([id number]) which revealed "1. No intracranial arterial occlusion or high-grade stenosis. 2. 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 3. Multiple lucent lesions near the skull vertex and mottled appearance of the calvarium, concerning for multiple myeloma. 4. Bilateral carotid bifurcation atherosclerosis without hemodynamically significant stenosis by NASCET criteria."  -Advised pt that significant Vitamin D deficiency, Osteoporosis, and Myeloma could all cause the bone changes observed on the scan. -Advised pt that although there was no monoclonal protein increase on labs, there was K/L light chain elevation.  -Discussed CRAB criteria: no hypercalcemia, stable renal function, no anemia, unclear if there are bone lesions. -Discussed common treatment options for Multiple Myeloma given pt's age and functional status.  -Advised pt that a w/u for Multiple Myeloma would include labs, 24-hr UPEP, whole body bone scan, & potentially a BM Bx. -Will get labs today -Will get a PET/CT in 1 week -Will see back in 3 weeks via phone   FOLLOW UP: Labs today PET/CT in 1  week Phone visit with Dr Irene Limbo in 3weeks   All of the patients questions were answered with apparent satisfaction. The patient knows to call the clinic with any problems, questions or concerns.  I spent 35 mins counseling the patient face to face. The total time spent in the appointment was 45 minutes and more than 50% was on counseling and direct patient cares.    Sullivan Lone MD [place] AAHIVMS [facility] Hematology/Oncology Physician [facility]  (Office):       [phone] (Work cell):  [phone] (Fax):           [phone]  05/10/2020 12:36 PM  I, Yevette Edwards, am acting as a scribe for Dr. Sullivan Lone.   .I have reviewed the above documentation for accuracy and completeness, and I agree with the above. Brunetta Genera MD

## 2020-05-10 NOTE — Telephone Encounter (Signed)
Scheduled appointment per 1/3 los. Spoke to patient and patient's son who are both aware of appointment date and time.

## 2020-05-11 LAB — KAPPA/LAMBDA LIGHT CHAINS
Kappa free light chain: 58.8 mg/L — ABNORMAL HIGH (ref 3.3–19.4)
Kappa, lambda light chain ratio: 3.7 — ABNORMAL HIGH (ref 0.26–1.65)
Lambda free light chains: 15.9 mg/L (ref 5.7–26.3)

## 2020-05-12 LAB — MULTIPLE MYELOMA PANEL, SERUM
Albumin SerPl Elph-Mcnc: 3.5 g/dL (ref 2.9–4.4)
Albumin/Glob SerPl: 1.1 (ref 0.7–1.7)
Alpha 1: 0.3 g/dL (ref 0.0–0.4)
Alpha2 Glob SerPl Elph-Mcnc: 0.7 g/dL (ref 0.4–1.0)
B-Globulin SerPl Elph-Mcnc: 1.1 g/dL (ref 0.7–1.3)
Gamma Glob SerPl Elph-Mcnc: 1.4 g/dL (ref 0.4–1.8)
Globulin, Total: 3.5 g/dL (ref 2.2–3.9)
IgA: 249 mg/dL (ref 64–422)
IgG (Immunoglobin G), Serum: 1321 mg/dL (ref 586–1602)
IgM (Immunoglobulin M), Srm: 190 mg/dL (ref 26–217)
Total Protein ELP: 7 g/dL (ref 6.0–8.5)

## 2020-05-12 LAB — BETA 2 MICROGLOBULIN, SERUM: Beta-2 Microglobulin: 2.4 mg/L (ref 0.6–2.4)

## 2020-05-14 DIAGNOSIS — Z1159 Encounter for screening for other viral diseases: Secondary | ICD-10-CM | POA: Diagnosis not present

## 2020-05-14 DIAGNOSIS — Z20828 Contact with and (suspected) exposure to other viral communicable diseases: Secondary | ICD-10-CM | POA: Diagnosis not present

## 2020-05-20 DIAGNOSIS — Z20828 Contact with and (suspected) exposure to other viral communicable diseases: Secondary | ICD-10-CM | POA: Diagnosis not present

## 2020-05-26 ENCOUNTER — Other Ambulatory Visit: Payer: Self-pay

## 2020-05-26 ENCOUNTER — Ambulatory Visit (HOSPITAL_COMMUNITY)
Admission: RE | Admit: 2020-05-26 | Discharge: 2020-05-26 | Disposition: A | Discharge status: Home / Self Care | Payer: Medicare Other | Source: Ambulatory Visit | Attending: Hematology | Admitting: Hematology

## 2020-05-26 DIAGNOSIS — M899 Disorder of bone, unspecified: Secondary | ICD-10-CM

## 2020-05-26 DIAGNOSIS — C9000 Multiple myeloma not having achieved remission: Secondary | ICD-10-CM | POA: Insufficient documentation

## 2020-05-26 LAB — GLUCOSE, CAPILLARY: Glucose-Capillary: 100 mg/dL — ABNORMAL HIGH (ref 70–99)

## 2020-05-26 MED ORDER — FLUDEOXYGLUCOSE F - 18 (FDG) INJECTION
8.6000 | Freq: Once | INTRAVENOUS | Status: AC
  Administered 2020-05-26: 8.6 via INTRAVENOUS

## 2020-05-27 DIAGNOSIS — Z20828 Contact with and (suspected) exposure to other viral communicable diseases: Secondary | ICD-10-CM | POA: Diagnosis not present

## 2020-05-30 NOTE — Progress Notes (Signed)
HEMATOLOGY/ONCOLOGY CONSULTATION NOTE  Date of Service: 05/30/2020  Patient Care Team: Eulas Post, MD as PCP - General (Family Medicine) [place] as Registered Nurse  CHIEF COMPLAINTS/PURPOSE OF CONSULTATION:  Lucent areas of skull/increased K/L light chain ratio  HISTORY OF PRESENTING ILLNESS:   Theresa Norton is a wonderful 87 y.o. female who has been referred to [place] by Dr. Elease Hashimoto for evaluation and management of concern for plasma cell disorder. Pt is accompanied today by her son. The pt reports that she is doing well overall.    The pt reports she was extremely dizzy and imbalanced prior to her 04/07/20 CT scan. The pt had a fall where she injured her head a few days before the onset of these symptoms. She denies any current dizziness. Pt notes some increased fatigue over the last few years. She feels that her retirement home is contributing to the feeling of "slowing down".   She is not currently on oxygen but continues to experience difficulty breathing. Pt notes that her Afib and heart disease have remained stable. The pt recently had left ankle swelling that resolved with diuretics. Her son feels that the pt has been experiencing depression, which the pt does not agree with.   Of note prior to the patient's visit today, pt has had CT Angio Head & Neck ([id number]) ([id number]) completed on 04/07/2020 with results revealing "1. No intracranial arterial occlusion or high-grade stenosis. 2. 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 3. Multiple lucent lesions near the skull vertex and mottled appearance of the calvarium, concerning for multiple myeloma. 4. Bilateral carotid bifurcation atherosclerosis without hemodynamically significant stenosis by NASCET criteria."   Most recent lab results (04/07/2020) of CBC w/diff & BMP is as follows: all values are WNL except for Hgb at  15.4, MCV at 100.2, MCH at 34.7, Sodium at 134, CO2 at 21, Glucose at 109, GFR Est at 56. 04/20/2020 K/L light chains is as follows: Kappa free light chain at 83.3, Lambda free light chain at 16.6, K/L ratio at 5.02. 04/20/2020 Immunofixation Electrophoresis shows: No Monoclonal Protein Detected 04/20/2020 IgE at 1299  On review of systems, pt reports fatigue, SOB and denies dizziness, fevers, chills, new bone pain, headaches, neck pain, unexpected weight loss, calf pain and any other symptoms.   On PMHx the pt reports CAD, Pacemaker, PAF.  Interval History  I connected with Gaylen Pereira. Hinds on 05/31/2020 by telephone and verified that I am speaking with the correct person using two identifiers.   I discussed the limitations of evaluation and management by telemedicine. The patient expressed understanding and agreed to proceed.   Other persons participating in the visit and their role in the encounter:                                                    -Reinaldo Raddle, Medical Scribe                                                   -Nathanial Rancher, MD     Patient's location: Home Provider's location: [place] [facility]  Theresa Norton is a wonderful 87 y.o. female who is here today for evaluation and management of concern for plasma cell disorder. The patient's last visit with [place] was on 05/10/2020. The pt reports that she is doing well overall. We are joined today by her son.  The pt reports no new symptoms or concerns.  Of note since the patient's last visit, pt has had NM PET Whole Body ([id number]) on 05/26/2020, which revealed "1. No evidence of active multiple myeloma on whole-body FDG PET scan. 2. No evidence of lytic lesion on CT portion."  Lab results from 05/10/2020 of CBC w/diff and CMP is as follows: all values are WNL except for Glucose of 100, AST of 44. 05/10/2020 LDH of 233. 05/10/2020 Kappa Lambda Light chains all WNL except kappa free light chains of 58.8 and  Kappa,lambda light chain ratio of 3.70. 05/10/2020 MMP all WNL.  05/10/2020 Beta 2 microglobulin of 2.4.   On review of systems, pt denies any symptoms.     MEDICAL HISTORY:  Past Medical History:  Diagnosis Date  . Allergy   . Anemia   . Arthritis    "in my fingers" (07/08/2013)  . CAD (coronary artery disease)    a. 05/2013 nonobs dzs by cath.  . Colon polyps   . Groin hematoma    a. 05/2013 R groin hematoma post-cath - u/s 05/31/13 large 7.49 cm r inguinal hematoma extending into thigh, no psa or avf.  . H/O hiatal hernia   . Heart block AV second degree    a. s/p MDT Addapta dual chamber pacemaker 05/2013 by Dr Rayann Heman.  . Hypertension   . On home oxygen therapy    "2L maybe 12h/day" (07/08/2013)  . Pacemaker   . Pacemaker   . PAF (paroxysmal atrial fibrillation) ([place])    a. Found post-pacer 05/2013->Xarelto added;  b. 05/2013 Echo: EF 60-65%, mild LVH, nl wall motion w/o rwma.    SURGICAL HISTORY: Past Surgical History:  Procedure Laterality Date  . BREAST CYST EXCISION Right 1959; 1970's  . BUNIONECTOMY Bilateral 1967  . CARDIAC CATHETERIZATION    . CATARACT EXTRACTION W/ INTRAOCULAR LENS  IMPLANT, BILATERAL Bilateral   . DILATION AND CURETTAGE OF UTERUS    . INSERT / REPLACE / REMOVE PACEMAKER  05/2013   MDT ADDRL1 implanted by Dr Rayann Heman for heart block  . INSERT / REPLACE / REMOVE PACEMAKER  06/2013   Right ventricular lead perforation with tamponade  . LEAD REVISION N/A 06/13/2013   Procedure: LEAD REVISION;  Surgeon: Coralyn Mark, MD;  Location: [facility] C[facility];  Service: Cardiovascular;  Laterality: N/A;  . LEFT HEART CATHETERIZATION WITH CORONARY ANGIOGRAM N/A 05/27/2013   Procedure: LEFT HEART CATHETERIZATION WITH CORONARY ANGIOGRAM;  Surgeon: Birdie Riddle, MD;  Location: [facility];  Service: Cardiovascular;  Laterality: N/A;  . PERICARDIAL TAP N/A 06/11/2013   Procedure: PERICARDIAL TAP;  Surgeon: Birdie Riddle, MD;  Location: [facility];  Service: Cardiovascular;   Laterality: N/A;  . PERMANENT PACEMAKER INSERTION N/A 05/27/2013   Procedure: PERMANENT PACEMAKER INSERTION;  Surgeon: Coralyn Mark, MD;  Location: [facility] C[facility];  Service: Cardiovascular;  Laterality: N/A;  . TEMPORARY PACEMAKER INSERTION N/A 05/27/2013   Procedure: TEMPORARY PACEMAKER INSERTION;  Surgeon: Birdie Riddle, MD;  Location: [facility] C[facility];  Service: Cardiovascular;  Laterality: N/A;  . VARICOSE VEIN SURGERY Bilateral ~ 2002    SOCIAL HISTORY: Social History   Socioeconomic History  . Marital status: Widowed  Spouse name: Not on file  . Number of children: Not on file  . Years of education: Not on file  . Highest education level: Not on file  Occupational History  . Not on file  Tobacco Use  . Smoking status: Never Smoker  . Smokeless tobacco: Never Used  Vaping Use  . Vaping Use: Never used  Substance and Sexual Activity  . Alcohol use: Yes    Alcohol/week: 14.0 standard drinks    Types: 7 Glasses of wine, 7 Cans of beer per week    Comment: 2019: wine in the evening   . Drug use: No  . Sexual activity: Never  Other Topics Concern  . Not on file  Social History Narrative   Living at [place] Strain: Low Risk   . Difficulty of Paying Living Expenses: Not hard at all  Food Insecurity: No Food Insecurity  . Worried About Charity fundraiser in the Last Year: Never true  . Ran Out of Food in the Last Year: Never true  Transportation Needs: No Transportation Needs  . Lack of Transportation (Medical): No  . Lack of Transportation (Non-Medical): No  Physical Activity: Insufficiently Active  . Days of Exercise per Week: 7 days  . Minutes of Exercise per Session: 10 min  Stress: No Stress Concern Present  . Feeling of Stress : Only a little  Social Connections: Unknown  . Frequency of Communication with Friends and Family: Not on file  . Frequency of Social Gatherings with Friends and Family: Twice a  week  . Attends Religious Services: Not on file  . Active Member of Clubs or Organizations: Not on file  . Attends Archivist Meetings: Not on file  . Marital Status: Not on file  Intimate Partner Violence: Not on file    FAMILY HISTORY: Family History  Problem Relation Age of Onset  . Cancer Father        lung    ALLERGIES:  is allergic to codeine, morphine and related, and other.  MEDICATIONS:  Current Outpatient Medications  Medication Sig Dispense Refill  . acetaminophen (TYLENOL) 325 MG tablet Take 325 mg by mouth every 6 (six) hours as needed for mild pain.    Marland Kitchen amLODipine (NORVASC) 5 MG tablet TAKE 1 TABLET BY MOUTH EVERY DAY 90 tablet 0  . beta carotene w/minerals (OCUVITE) tablet Take 1 tablet by mouth daily.    Marland Kitchen dextromethorphan-guaiFENesin (MUCINEX DM) 30-600 MG 12hr tablet Take 1 tablet by mouth 2 (two) times daily as needed for cough.    . donepezil (ARICEPT) 5 MG tablet TAKE 1 TABLET(5 MG) BY MOUTH AT BEDTIME 30 tablet 1  . furosemide (LASIX) 20 MG tablet TAKE 1 TABLET BY MOUTH DAILY AS NEEDED FOR LEG SWELLING 90 tablet 0  . loratadine (CLARITIN) 10 MG tablet Take 10 mg by mouth daily as needed for allergies.     . Multiple Vitamin (MULTIVITAMIN ADULT PO) Take by mouth.    . sertraline (ZOLOFT) 50 MG tablet TAKE 1 TABLET(50 MG) BY MOUTH DAILY 90 tablet 0  . Thiamine HCl (VITAMIN B-1 PO) Take 99 mcg by mouth daily.    . TURMERIC PO Take 1 capsule by mouth daily.      No current facility-administered medications for this visit.    REVIEW OF SYSTEMS:    10 Point review of Systems was done is negative except as noted above.   PHYSICAL EXAMINATION: ECOG  PERFORMANCE STATUS: 2 - Symptomatic, <50% confined to bed  . There were no vitals filed for this visit. There were no vitals filed for this visit. .There is no height or weight on file to calculate BMI.  Telehealth Visit.   LABORATORY DATA:  I have reviewed the data as listed  . CBC Latest Ref  Rng & Units 05/10/2020 04/07/2020 09/14/2015  WBC 4.0 - 10.5 K/uL 7.8 7.3 9.1  Hemoglobin 12.0 - 15.0 g/dL 14.8 15.4(H) 12.3  Hematocrit 36.0 - 46.0 % 43.0 44.5 35.4  Platelets 150 - 400 K/uL 212 242 267    . CMP Latest Ref Rng & Units 05/10/2020 04/20/2020 04/07/2020  Glucose 70 - 99 mg/dL 100(H) - 109(H)  BUN 8 - 23 mg/dL 10 - 11  Creatinine 0.44 - 1.00 mg/dL 0.81 - 0.97  Sodium 135 - 145 mmol/L 138 - 134(L)  Potassium 3.5 - 5.1 mmol/L 4.1 - 4.1  Chloride 98 - 111 mmol/L 104 - 100  CO2 22 - 32 mmol/L 24 - 21(L)  Calcium 8.9 - 10.3 mg/dL 9.1 - 9.1  Total Protein 6.5 - 8.1 g/dL 7.7 6.9 7.5  Total Bilirubin 0.3 - 1.2 mg/dL 0.9 - 0.8  Alkaline Phos 38 - 126 U/L 100 - 87  AST 15 - 41 U/L 44(H) - 37  ALT 0 - 44 U/L 28 - 24   Component     Latest Ref Rng & Units 05/10/2020  IgG (Immunoglobin G), Serum     586 - 1,602 mg/dL 1,321  IgA     64 - 422 mg/dL 249  IgM (Immunoglobulin M), Srm     26 - 217 mg/dL 190  Total Protein ELP     6.0 - 8.5 g/dL 7.0  Albumin SerPl Elph-Mcnc     2.9 - 4.4 g/dL 3.5  Alpha 1     0.0 - 0.4 g/dL 0.3  Alpha2 Glob SerPl Elph-Mcnc     0.4 - 1.0 g/dL 0.7  B-Globulin SerPl Elph-Mcnc     0.7 - 1.3 g/dL 1.1  Gamma Glob SerPl Elph-Mcnc     0.4 - 1.8 g/dL 1.4  M Protein SerPl Elph-Mcnc     Not Observed g/dL Not Observed  Globulin, Total     2.2 - 3.9 g/dL 3.5  Albumin/Glob SerPl     0.7 - 1.7 1.1  IFE 1      Comment  Please Note (HCV):      Comment  Kappa free light chain     3.3 - 19.4 mg/L 58.8 (H)  Lamda free light chains     5.7 - 26.3 mg/L 15.9  Kappa, lamda light chain ratio     0.26 - 1.65 3.70 (H)  LDH     98 - 192 U/L 233 (H)  Beta-2 Microglobulin     0.6 - 2.4 mg/L 2.4    RADIOGRAPHIC STUDIES: I have personally reviewed the radiological images as listed and agreed with the findings in the report. NM PET Image Initial (PI) Whole Body  Result Date: 05/26/2020 CLINICAL DATA:  Initial treatment strategy for multiple myeloma. EXAM: NUCLEAR  MEDICINE PET WHOLE BODY TECHNIQUE: 8.6 mCi F-18 FDG was injected intravenously. Full-ring PET imaging was performed from the head to foot after the radiotracer. CT data was obtained and used for attenuation correction and anatomic localization. Fasting blood glucose: 100 mg/dl COMPARISON:  Chest CT 04/07/2020 FINDINGS: Mediastinal blood pool activity: SUV max 2.66 HEAD/NECK: No hypermetabolic activity in the scalp. No hypermetabolic cervical lymph  nodes. Incidental CT findings: none CHEST: No hypermetabolic mediastinal or hilar nodes. No suspicious pulmonary nodules on the CT scan. Incidental CT findings: none ABDOMEN/PELVIS: No abnormal hypermetabolic activity within the liver, pancreas, adrenal glands, or spleen. No hypermetabolic lymph nodes in the abdomen or pelvis. Incidental CT findings: Atherosclerotic calcification of the aorta. SKELETON: No focal activity in the axillary appendicular skeleton suggest active multiple myeloma. Paraspinal activity in the thoracic is favored brown fat activity On the CT portion exam no lytic lesions are identified. Incidental CT findings: none EXTREMITIES: No suspicious lesions Incidental CT findings: none IMPRESSION: 1. No evidence of active multiple myeloma on whole-body FDG PET scan. 2. No evidence of lytic lesion on CT portion. Electronically Signed   By: Suzy Bouchard M.D.   On: 05/26/2020 13:10    ASSESSMENT & PLAN:   87 yo with   1) Monoclonal Paraproteinemia - abnormal light chain ratio 2) Multiple lucent lesions and mottled appearance of skull on CT head ? Mets vs myeloma vs metabolic bone disease.  PLAN: -Discussed patient's most recent labs from 05/10/2020; no major abnormality in blood counts, normal blood chemistries. No high calcium or kidney issues as with Multiple Myeloma. No M protein spike on blood test.  -Ratio of light chains slightly abnormal but non-specific and could be related to differential renal excretion of light chains. -Discussed NM  PET Whole Body ([id number]) on 05/26/2020; no active bone tumors. -Recommended pt return every 6 months with labs. The pt/her son and POA does not wish to return. Will f/u with PCP for rpt interval labs to monitor for monoclonal paraproteinemia with cbc/diff, cmp , SPEP and Kappa/lambda free light chains.   FOLLOW UP: RTC with PCP   All of the patients questions were answered with apparent satisfaction. The patient knows to call the clinic with any problems, questions or concerns.   The total time spent in the appointment was 10 minutes and more than 50% was on counseling and direct patient cares.    Sullivan Lone MD [place] AAHIVMS [facility] Hematology/Oncology Physician [facility]  (Office):       [phone] (Work cell):  [phone] (Fax):           [phone]  05/30/2020 9:11 AM  I, Reinaldo Raddle, am acting as scribe for Dr. Sullivan Lone, MD.   .I have reviewed the above documentation for accuracy and completeness, and I agree with the above. Brunetta Genera MD

## 2020-05-31 ENCOUNTER — Inpatient Hospital Stay (HOSPITAL_BASED_OUTPATIENT_CLINIC_OR_DEPARTMENT_OTHER): Payer: Medicare Other | Admitting: Hematology

## 2020-05-31 DIAGNOSIS — Z20828 Contact with and (suspected) exposure to other viral communicable diseases: Secondary | ICD-10-CM | POA: Diagnosis not present

## 2020-05-31 DIAGNOSIS — M899 Disorder of bone, unspecified: Secondary | ICD-10-CM

## 2020-06-03 DIAGNOSIS — Z20828 Contact with and (suspected) exposure to other viral communicable diseases: Secondary | ICD-10-CM | POA: Diagnosis not present

## 2020-06-07 DIAGNOSIS — Z20828 Contact with and (suspected) exposure to other viral communicable diseases: Secondary | ICD-10-CM | POA: Diagnosis not present

## 2020-06-14 DIAGNOSIS — Z20828 Contact with and (suspected) exposure to other viral communicable diseases: Secondary | ICD-10-CM | POA: Diagnosis not present

## 2020-06-17 DIAGNOSIS — Z20828 Contact with and (suspected) exposure to other viral communicable diseases: Secondary | ICD-10-CM | POA: Diagnosis not present

## 2020-06-21 DIAGNOSIS — Z20828 Contact with and (suspected) exposure to other viral communicable diseases: Secondary | ICD-10-CM | POA: Diagnosis not present

## 2020-06-28 DIAGNOSIS — Z20828 Contact with and (suspected) exposure to other viral communicable diseases: Secondary | ICD-10-CM | POA: Diagnosis not present

## 2020-06-30 ENCOUNTER — Other Ambulatory Visit: Payer: Self-pay

## 2020-06-30 ENCOUNTER — Ambulatory Visit (INDEPENDENT_AMBULATORY_CARE_PROVIDER_SITE_OTHER): Payer: Medicare Other

## 2020-06-30 DIAGNOSIS — Z0000 Encounter for general adult medical examination without abnormal findings: Secondary | ICD-10-CM

## 2020-06-30 NOTE — Patient Instructions (Addendum)
[name] , Thank you for taking time to come for your Medicare Wellness Visit. I appreciate your ongoing commitment to your health goals. Please review the following plan we discussed and let me know if I can assist you in the future.   Screening recommendations/referrals: Colonoscopy: No longer required Mammogram: No longer required Recommended yearly ophthalmology/optometry visit for glaucoma screening and checkup Recommended yearly dental visit for hygiene and checkup  Vaccinations: Influenza vaccine: Declined and discussed Pneumococcal vaccine: Due and discussed Tdap vaccine: Due and discussed Shingles vaccine: Shingrix discussed. Please contact your pharmacy for coverage information.    Covid-19:Completed 1/18, 06/23/19 & 03/04/20  Advanced directives: Please bring a copy of your health care power of attorney and living will to the office at your convenience.  Conditions/risks identified: Keep living and stay healthy  Next appointment: Follow up in one year for your annual wellness visit    Preventive Care 65 Years and Older, Female Preventive care refers to lifestyle choices and visits with your health care provider that can promote health and wellness. What does preventive care include?  A yearly physical exam. This is also called an annual well check.  Dental exams once or twice a year.  Routine eye exams. Ask your health care provider how often you should have your eyes checked.  Personal lifestyle choices, including:  Daily care of your teeth and gums.  Regular physical activity.  Eating a healthy diet.  Avoiding tobacco and drug use.  Limiting alcohol use.  Practicing safe sex.  Taking low-dose aspirin every day.  Taking vitamin and mineral supplements as recommended by your health care provider. What happens during an annual well check? The services and screenings done by your health care provider during your annual well check will depend on your age,  overall health, lifestyle risk factors, and family history of disease. Counseling  Your health care provider may ask you questions about your:  Alcohol use.  Tobacco use.  Drug use.  Emotional well-being.  Home and relationship well-being.  Sexual activity.  Eating habits.  History of falls.  Memory and ability to understand (cognition).  Work and work Statistician.  Reproductive health. Screening  You may have the following tests or measurements:  Height, weight, and BMI.  Blood pressure.  Lipid and cholesterol levels. These may be checked every 5 years, or more frequently if you are over 62 years old.  Skin check.  Lung cancer screening. You may have this screening every year starting at age 69 if you have a 30-pack-year history of smoking and currently smoke or have quit within the past 15 years.  Fecal occult blood test (FOBT) of the stool. You may have this test every year starting at age 64.  Flexible sigmoidoscopy or colonoscopy. You may have a sigmoidoscopy every 5 years or a colonoscopy every 10 years starting at age 33.  Hepatitis C blood test.  Hepatitis B blood test.  Sexually transmitted disease (STD) testing.  Diabetes screening. This is done by checking your blood sugar (glucose) after you have not eaten for a while (fasting). You may have this done every 1-3 years.  Bone density scan. This is done to screen for osteoporosis. You may have this done starting at age 47.  Mammogram. This may be done every 1-2 years. Talk to your health care provider about how often you should have regular mammograms. Talk with your health care provider about your test results, treatment options, and if necessary, the need for more tests. Vaccines  Your health care provider may recommend certain vaccines, such as:  Influenza vaccine. This is recommended every year.  Tetanus, diphtheria, and acellular pertussis (Tdap, Td) vaccine. You may need a Td booster every 10  years.  Zoster vaccine. You may need this after age 65.  Pneumococcal 13-valent conjugate (PCV13) vaccine. One dose is recommended after age 14.  Pneumococcal polysaccharide (PPSV23) vaccine. One dose is recommended after age 45. Talk to your health care provider about which screenings and vaccines you need and how often you need them. This information is not intended to replace advice given to you by your health care provider. Make sure you discuss any questions you have with your health care provider. Document Released: 05/21/2015 Document Revised: 01/12/2016 Document Reviewed: 02/23/2015 Elsevier Interactive Patient Education  2017 [place] Prevention in the Home Falls can cause injuries. They can happen to people of all ages. There are many things you can do to make your home safe and to help prevent falls. What can I do on the outside of my home?  Regularly fix the edges of walkways and driveways and fix any cracks.  Remove anything that might make you trip as you walk through a door, such as a raised step or threshold.  Trim any bushes or trees on the path to your home.  Use bright outdoor lighting.  Clear any walking paths of anything that might make someone trip, such as rocks or tools.  Regularly check to see if handrails are loose or broken. Make sure that both sides of any steps have handrails.  Any raised decks and porches should have guardrails on the edges.  Have any leaves, snow, or ice cleared regularly.  Use sand or salt on walking paths during winter.  Clean up any spills in your garage right away. This includes oil or grease spills. What can I do in the bathroom?  Use night lights.  Install grab bars by the toilet and in the tub and shower. Do not use towel bars as grab bars.  Use non-skid mats or decals in the tub or shower.  If you need to sit down in the shower, use a plastic, non-slip stool.  Keep the floor dry. Clean up any water that  spills on the floor as soon as it happens.  Remove soap buildup in the tub or shower regularly.  Attach bath mats securely with double-sided non-slip rug tape.  Do not have throw rugs and other things on the floor that can make you trip. What can I do in the bedroom?  Use night lights.  Make sure that you have a light by your bed that is easy to reach.  Do not use any sheets or blankets that are too big for your bed. They should not hang down onto the floor.  Have a firm chair that has side arms. You can use this for support while you get dressed.  Do not have throw rugs and other things on the floor that can make you trip. What can I do in the kitchen?  Clean up any spills right away.  Avoid walking on wet floors.  Keep items that you use a lot in easy-to-reach places.  If you need to reach something above you, use a strong step stool that has a grab bar.  Keep electrical cords out of the way.  Do not use floor polish or wax that makes floors slippery. If you must use wax, use non-skid floor wax.  Do  not have throw rugs and other things on the floor that can make you trip. What can I do with my stairs?  Do not leave any items on the stairs.  Make sure that there are handrails on both sides of the stairs and use them. Fix handrails that are broken or loose. Make sure that handrails are as long as the stairways.  Check any carpeting to make sure that it is firmly attached to the stairs. Fix any carpet that is loose or worn.  Avoid having throw rugs at the top or bottom of the stairs. If you do have throw rugs, attach them to the floor with carpet tape.  Make sure that you have a light switch at the top of the stairs and the bottom of the stairs. If you do not have them, ask someone to add them for you. What else can I do to help prevent falls?  Wear shoes that:  Do not have high heels.  Have rubber bottoms.  Are comfortable and fit you well.  Are closed at the  toe. Do not wear sandals.  If you use a stepladder:  Make sure that it is fully opened. Do not climb a closed stepladder.  Make sure that both sides of the stepladder are locked into place.  Ask someone to hold it for you, if possible.  Clearly mark and make sure that you can see:  Any grab bars or handrails.  First and last steps.  Where the edge of each step is.  Use tools that help you move around (mobility aids) if they are needed. These include:  Canes.  Walkers.  Scooters.  Crutches.  Turn on the lights when you go into a dark area. Replace any light bulbs as soon as they burn out.  Set up your furniture so you have a clear path. Avoid moving your furniture around.  If any of your floors are uneven, fix them.  If there are any pets around you, be aware of where they are.  Review your medicines with your doctor. Some medicines can make you feel dizzy. This can increase your chance of falling. Ask your doctor what other things that you can do to help prevent falls. This information is not intended to replace advice given to you by your health care provider. Make sure you discuss any questions you have with your health care provider. Document Released: 02/18/2009 Document Revised: 09/30/2015 Document Reviewed: 05/29/2014 Elsevier Interactive Patient Education  2017 [employer].

## 2020-06-30 NOTE — Progress Notes (Signed)
Theresa Norton was present for AWV.

## 2020-06-30 NOTE — Progress Notes (Signed)
Virtual Visit via Telephone Note  I connected with  Theresa Norton on 06/30/20 at 11:00 AM EST by telephone and verified that I am speaking with the correct person using two identifiers.  Medicare Annual Wellness visit completed telephonically due to Covid-19 pandemic.   Persons participating in this call: This Health Coach and this patient.   Location: Patient: Home Provider: Office    I discussed the limitations, risks, security and privacy concerns of performing an evaluation and management service by telephone and the availability of in person appointments. The patient expressed understanding and agreed to proceed.  Unable to perform video visit due to video visit attempted and failed and/or patient does not have video capability.   Some vital signs may be absent or patient reported.   Willette Brace, LPN    Subjective:   Theresa Norton is a 87 y.o. female who presents for Medicare Annual (Subsequent) preventive examination.  Review of Systems     Cardiac Risk Factors include: advanced age (>16men, >44 women);hypertension     Objective:    There were no vitals filed for this visit. There is no height or weight on file to calculate BMI.  Advanced Directives 06/30/2020 06/11/2019 02/13/2018 01/23/2017 02/12/2016 07/08/2013 06/12/2013  Does Patient Have a Medical Advance Directive? Yes Yes Yes Yes No Patient has advance directive, copy not in chart Patient has advance directive, copy not in chart  Type of Advance Directive Healthcare Power of [place];Living will - - - - Living will  Does patient want to make changes to medical advance directive? - No - Patient declined - - - No change requested No  Copy of Star in Chart? No - copy requested No - copy requested - - - Copy requested from other (Comment) Copy requested from family  Would patient like information on creating a medical advance directive? - - - - No - patient  declined information - -  Pre-existing out of facility DNR order (yellow form or pink MOST form) - - - - - - No    Current Medications (verified) Outpatient Encounter Medications as of 06/30/2020  Medication Sig  . acetaminophen (TYLENOL) 325 MG tablet Take 325 mg by mouth every 6 (six) hours as needed for mild pain.  Marland Kitchen amLODipine (NORVASC) 5 MG tablet TAKE 1 TABLET BY MOUTH EVERY DAY  . beta carotene w/minerals (OCUVITE) tablet Take 1 tablet by mouth daily.  Marland Kitchen donepezil (ARICEPT) 5 MG tablet TAKE 1 TABLET(5 MG) BY MOUTH AT BEDTIME  . loratadine (CLARITIN) 10 MG tablet Take 10 mg by mouth daily as needed for allergies.   . Multiple Vitamin (MULTIVITAMIN ADULT PO) Take by mouth.  . Thiamine HCl (VITAMIN B-1 PO) Take 99 mcg by mouth daily.  . vitamin B-12 (CYANOCOBALAMIN) 500 MCG tablet Take 500 mcg by mouth daily.  Marland Kitchen dextromethorphan-guaiFENesin (MUCINEX DM) 30-600 MG 12hr tablet Take 1 tablet by mouth 2 (two) times daily as needed for cough. (Patient not taking: Reported on 06/30/2020)  . furosemide (LASIX) 20 MG tablet TAKE 1 TABLET BY MOUTH DAILY AS NEEDED FOR LEG SWELLING (Patient not taking: Reported on 06/30/2020)  . TURMERIC PO Take 1 capsule by mouth daily.  (Patient not taking: Reported on 06/30/2020)  . [DISCONTINUED] sertraline (ZOLOFT) 50 MG tablet TAKE 1 TABLET(50 MG) BY MOUTH DAILY   No facility-administered encounter medications on file as of 06/30/2020.    Allergies (verified) Codeine, Morphine and related, and Other   History:  Past Medical History:  Diagnosis Date  . Allergy   . Anemia   . Arthritis    "in my fingers" (07/08/2013)  . CAD (coronary artery disease)    a. 05/2013 nonobs dzs by cath.  . Colon polyps   . Groin hematoma    a. 05/2013 R groin hematoma post-cath - u/s 05/31/13 large 7.49 cm r inguinal hematoma extending into thigh, no psa or avf.  . H/O hiatal hernia   . Heart block AV second degree    a. s/p MDT Addapta dual chamber pacemaker 05/2013 by Dr  Rayann Heman.  . Hypertension   . On home oxygen therapy    "2L maybe 12h/day" (07/08/2013)  . Pacemaker   . Pacemaker   . PAF (paroxysmal atrial fibrillation) ([place])    a. Found post-pacer 05/2013->Xarelto added;  b. 05/2013 Echo: EF 60-65%, mild LVH, nl wall motion w/o rwma.   Past Surgical History:  Procedure Laterality Date  . BREAST CYST EXCISION Right 1959; 1970's  . BUNIONECTOMY Bilateral 1967  . CARDIAC CATHETERIZATION    . CATARACT EXTRACTION W/ INTRAOCULAR LENS  IMPLANT, BILATERAL Bilateral   . DILATION AND CURETTAGE OF UTERUS    . INSERT / REPLACE / REMOVE PACEMAKER  05/2013   MDT [id number] implanted by Dr Rayann Heman for heart block  . INSERT / REPLACE / REMOVE PACEMAKER  06/2013   Right ventricular lead perforation with tamponade  . LEAD REVISION N/A 06/13/2013   Procedure: LEAD REVISION;  Surgeon: Coralyn Mark, MD;  Location: [facility];  Service: Cardiovascular;  Laterality: N/A;  . LEFT HEART CATHETERIZATION WITH CORONARY ANGIOGRAM N/A 05/27/2013   Procedure: LEFT HEART CATHETERIZATION WITH CORONARY ANGIOGRAM;  Surgeon: Birdie Riddle, MD;  Location: [facility];  Service: Cardiovascular;  Laterality: N/A;  . PERICARDIAL TAP N/A 06/11/2013   Procedure: PERICARDIAL TAP;  Surgeon: Birdie Riddle, MD;  Location: [facility];  Service: Cardiovascular;  Laterality: N/A;  . PERMANENT PACEMAKER INSERTION N/A 05/27/2013   Procedure: PERMANENT PACEMAKER INSERTION;  Surgeon: Coralyn Mark, MD;  Location: [facility];  Service: Cardiovascular;  Laterality: N/A;  . TEMPORARY PACEMAKER INSERTION N/A 05/27/2013   Procedure: TEMPORARY PACEMAKER INSERTION;  Surgeon: Birdie Riddle, MD;  Location: [facility];  Service: Cardiovascular;  Laterality: N/A;  . VARICOSE VEIN SURGERY Bilateral ~ 2002   Family History  Problem Relation Age of Onset  . Cancer Father        lung   Social History   Socioeconomic History  . Marital status: Widowed    Spouse name: Not on file  . Number of children: Not on file   . Years of education: Not on file  . Highest education level: Not on file  Occupational History  . Not on file  Tobacco Use  . Smoking status: Never Smoker  . Smokeless tobacco: Never Used  Vaping Use  . Vaping Use: Never used  Substance and Sexual Activity  . Alcohol use: Yes    Alcohol/week: 14.0 standard drinks    Types: 7 Glasses of wine, 7 Cans of beer per week    Comment: 2019: wine in the evening   . Drug use: No  . Sexual activity: Never  Other Topics Concern  . Not on file  Social History Narrative   Living at [place] Strain: Low Risk   . Difficulty of Paying Living Expenses: Not hard at all  Food Insecurity:  No Food Insecurity  . Worried About Charity fundraiser in the Last Year: Never true  . Ran Out of Food in the Last Year: Never true  Transportation Needs: No Transportation Needs  . Lack of Transportation (Medical): No  . Lack of Transportation (Non-Medical): No  Physical Activity: Inactive  . Days of Exercise per Week: 0 days  . Minutes of Exercise per Session: 0 min  Stress: No Stress Concern Present  . Feeling of Stress : Not at all  Social Connections: Socially Isolated  . Frequency of Communication with Friends and Family: More than three times a week  . Frequency of Social Gatherings with Friends and Family: Three times a week  . Attends Religious Services: Never  . Active Member of Clubs or Organizations: No  . Attends Archivist Meetings: Never  . Marital Status: Widowed    Tobacco Counseling Counseling given: Not Answered   Clinical Intake:  Pre-visit preparation completed: Yes  Pain : No/denies pain     BMI - recorded: 29.61 Nutritional Status: BMI 25 -29 Overweight Nutritional Risks: None Diabetes: No  How often do you need to have someone help you when you read instructions, pamphlets, or other written materials from your doctor or pharmacy?: 1 -  Never  Diabetic? No  Interpreter Needed?: No  Information entered by :: Charlott Rakes, LPN   Activities of Daily Living In your present state of health, do you have any difficulty performing the following activities: 06/30/2020  Hearing? Y  Comment HOH  Vision? N  Difficulty concentrating or making decisions? Y  Comment short term memeory at time  Walking or climbing stairs? N  Dressing or bathing? N  Doing errands, shopping? N  Preparing Food and eating ? Y  Comment [employer]  Using the Toilet? N  In the past six months, have you accidently leaked urine? N  Do you have problems with loss of bowel control? N  Managing your Medications? Y  Comment son alexander  Managing your Finances? N  Housekeeping or managing your Housekeeping? N  Some recent data might be hidden    Patient Care Team: Eulas Post, MD as PCP - General (Family Medicine) [place] as Registered Nurse  Indicate any recent Medical Services you may have received from other than [facility] providers in the past year (date may be approximate).     Assessment:   This is a routine wellness examination for Theresa Norton.  Hearing/Vision screen  Hearing Screening   125Hz  250Hz  500Hz  1000Hz  2000Hz  3000Hz  4000Hz  6000Hz  8000Hz   Right ear:           Left ear:           Comments: Pt is HOH reads lip better  Vision Screening Comments: Pt follows up with [facility] for eye exams   Dietary issues and exercise activities discussed: Current Exercise Habits: The patient does not participate in regular exercise at present  Goals    . DIET - INCREASE WATER INTAKE    . exercise 150 minutes every week    . Patient Stated     Keep living and stay healthy      Depression Screen PHQ 2/9 Scores 06/30/2020 06/11/2019 03/11/2018 01/23/2017 04/22/2015 04/16/2014 02/20/2013  PHQ - 2 Score 0 0 1 0 0 0 0    Fall Risk Fall Risk  06/30/2020 06/11/2019 04/02/2019 03/11/2018 01/23/2017  Falls in the  past year? 1 0 0 0 Yes  Comment - -  Emmi Telephone Survey: data to providers prior to load - -  Number falls in past yr: 1 - - - -  Injury with Fall? 1 - - - Yes  Risk for fall due to : Impaired mobility;Impaired balance/gait;History of fall(s) Medication side effect;Other (Comment) - - Impaired mobility  Risk for fall due to: Comment - oxygen patient - - -  Follow up Falls prevention discussed Falls evaluation completed;Education provided;Falls prevention discussed - - -    FALL RISK PREVENTION PERTAINING TO THE HOME:  Any stairs in or around the home? No  If so, are there any without handrails? No  Home free of loose throw rugs in walkways, pet beds, electrical cords, etc? Yes  Adequate lighting in your home to reduce risk of falls? Yes   ASSISTIVE DEVICES UTILIZED TO PREVENT FALLS:  Life alert? Yes  call light in rooms  Use of a cane, walker or w/c? Yes  Grab bars in the bathroom? Yes  Shower chair or bench in shower? Yes  Elevated toilet seat or a handicapped toilet? No   TIMED UP AND GO:  Was the test performed? No     Cognitive Function: Declines 6CIT MMSE - Mini Mental State Exam 01/23/2017  Orientation to time 3  Orientation to Place 5  Registration 3  Attention/ Calculation 5  Recall 3  Language- name 2 objects 2  Language- repeat 1  Language- follow 3 step command 3  Language- read & follow direction 1  Write a sentence 1  Copy design 1  Total score 28        Immunizations Immunization History  Administered Date(s) Administered  . Moderna Sars-Covid-2 Vaccination 05/26/2019, 06/23/2019, 03/04/2020  . Td 04/10/1997    TDAP status: Due, Education has been provided regarding the importance of this vaccine. Advised may receive this vaccine at local pharmacy or Health Dept. Aware to provide a copy of the vaccination record if obtained from local pharmacy or Health Dept. Verbalized acceptance and understanding.  Flu Vaccine status: Declined, Education has  been provided regarding the importance of this vaccine but patient still declined. Advised may receive this vaccine at local pharmacy or Health Dept. Aware to provide a copy of the vaccination record if obtained from local pharmacy or Health Dept. Verbalized acceptance and understanding.  Pneumococcal vaccine status: Due, Education has been provided regarding the importance of this vaccine. Advised may receive this vaccine at local pharmacy or Health Dept. Aware to provide a copy of the vaccination record if obtained from local pharmacy or Health Dept. Verbalized acceptance and understanding.  Covid-19 vaccine status: Completed vaccines  Qualifies for Shingles Vaccine? Yes   Zostavax completed No   Shingrix Completed?: No.    Education has been provided regarding the importance of this vaccine. Patient has been advised to call insurance company to determine out of pocket expense if they have not yet received this vaccine. Advised may also receive vaccine at local pharmacy or Health Dept. Verbalized acceptance and understanding.  Screening Tests Health Maintenance  Topic Date Due  . DEXA SCAN  Never done  . PNA vac Low Risk Adult (1 of 2 - PCV13) Never done  . TETANUS/TDAP  04/11/2007  . INFLUENZA VACCINE  08/05/2020 (Originally 12/07/2019)  . COVID-19 Vaccine (4 - Booster for Moderna series) 09/02/2020    Health Maintenance  Health Maintenance Due  Topic Date Due  . DEXA SCAN  Never done  . PNA vac Low Risk Adult (1 of 2 - PCV13) Never  done  . TETANUS/TDAP  04/11/2007    Colorectal cancer screening: No longer required.   Mammogram status: No longer required due to age.[id number]  Additional Screening:  Vision Screening: Recommended annual ophthalmology exams for early detection of glaucoma and other disorders of the eye. Is the patient up to date with their annual eye exam?  No  Who is the provider or what is the name of the office in which the patient attends annual eye exams?Pt has  not been for [facility] in years per son and pt has no complaints of eyes  If pt is not established with a provider, would they like to be referred to a provider to establish care? No .   Dental Screening: Recommended annual dental exams for proper oral hygiene  Community Resource Referral / Chronic Care Management: CRR required this visit?  No   CCM required this visit?  No      Plan:     I have personally reviewed and noted the following in the patient's chart:   . Medical and social history . Use of alcohol, tobacco or illicit drugs  . Current medications and supplements . Functional ability and status . Nutritional status . Physical activity . Advanced directives . List of other physicians . Hospitalizations, surgeries, and ER visits in previous 12 months . Vitals . Screenings to include cognitive, depression, and falls . Referrals and appointments  In addition, I have reviewed and discussed with patient certain preventive protocols, quality metrics, and best practice recommendations. A written personalized care plan for preventive services as well as general preventive health recommendations were provided to patient.     Willette Brace, LPN   1/76/1607   Nurse Notes: Pt son wants to address tetanus and PNA vaccine at next visit

## 2020-07-01 DIAGNOSIS — Z20828 Contact with and (suspected) exposure to other viral communicable diseases: Secondary | ICD-10-CM | POA: Diagnosis not present

## 2020-08-02 DIAGNOSIS — Z20828 Contact with and (suspected) exposure to other viral communicable diseases: Secondary | ICD-10-CM | POA: Diagnosis not present

## 2020-08-12 ENCOUNTER — Ambulatory Visit (INDEPENDENT_AMBULATORY_CARE_PROVIDER_SITE_OTHER): Payer: Medicare Other

## 2020-08-12 DIAGNOSIS — I442 Atrioventricular block, complete: Secondary | ICD-10-CM | POA: Diagnosis not present

## 2020-08-12 LAB — CUP PACEART REMOTE DEVICE CHECK
Battery Impedance: 942 Ohm
Battery Remaining Longevity: 58 mo
Battery Voltage: 2.78 V
Brady Statistic AP VP Percent: 1 %
Brady Statistic AP VS Percent: 0 %
Brady Statistic AS VP Percent: 99 %
Brady Statistic AS VS Percent: 1 %
Date Time Interrogation Session: 20220406160139
Implantable Lead Implant Date: 20150120
Implantable Lead Implant Date: 20150120
Implantable Lead Location: 753859
Implantable Lead Location: 753860
Implantable Lead Model: 5076
Implantable Lead Model: 5076
Implantable Pulse Generator Implant Date: 20150120
Lead Channel Impedance Value: 486 Ohm
Lead Channel Impedance Value: 488 Ohm
Lead Channel Pacing Threshold Amplitude: 0.5 V
Lead Channel Pacing Threshold Amplitude: 0.875 V
Lead Channel Pacing Threshold Pulse Width: 0.4 ms
Lead Channel Pacing Threshold Pulse Width: 0.4 ms
Lead Channel Setting Pacing Amplitude: 2 V
Lead Channel Setting Pacing Amplitude: 2.5 V
Lead Channel Setting Pacing Pulse Width: 0.4 ms
Lead Channel Setting Sensing Sensitivity: 2.8 mV

## 2020-08-16 DIAGNOSIS — Z20828 Contact with and (suspected) exposure to other viral communicable diseases: Secondary | ICD-10-CM | POA: Diagnosis not present

## 2020-08-23 DIAGNOSIS — Z20828 Contact with and (suspected) exposure to other viral communicable diseases: Secondary | ICD-10-CM | POA: Diagnosis not present

## 2020-08-24 NOTE — Progress Notes (Signed)
Remote pacemaker transmission.   

## 2020-08-30 DIAGNOSIS — Z20828 Contact with and (suspected) exposure to other viral communicable diseases: Secondary | ICD-10-CM | POA: Diagnosis not present

## 2020-09-13 DIAGNOSIS — Z20828 Contact with and (suspected) exposure to other viral communicable diseases: Secondary | ICD-10-CM | POA: Diagnosis not present

## 2020-09-20 DIAGNOSIS — Z20828 Contact with and (suspected) exposure to other viral communicable diseases: Secondary | ICD-10-CM | POA: Diagnosis not present

## 2020-09-27 DIAGNOSIS — Z20828 Contact with and (suspected) exposure to other viral communicable diseases: Secondary | ICD-10-CM | POA: Diagnosis not present

## 2020-10-04 DIAGNOSIS — Z20828 Contact with and (suspected) exposure to other viral communicable diseases: Secondary | ICD-10-CM | POA: Diagnosis not present

## 2020-10-11 ENCOUNTER — Emergency Department (HOSPITAL_COMMUNITY)
Admission: EM | Admit: 2020-10-11 | Discharge: 2020-10-11 | Disposition: A | Discharge status: Home / Self Care | Payer: Medicare Other | Attending: Emergency Medicine | Admitting: Emergency Medicine

## 2020-10-11 ENCOUNTER — Encounter (HOSPITAL_COMMUNITY): Payer: Self-pay

## 2020-10-11 ENCOUNTER — Emergency Department (HOSPITAL_COMMUNITY): Payer: Medicare Other

## 2020-10-11 DIAGNOSIS — M47812 Spondylosis without myelopathy or radiculopathy, cervical region: Secondary | ICD-10-CM | POA: Diagnosis not present

## 2020-10-11 DIAGNOSIS — M419 Scoliosis, unspecified: Secondary | ICD-10-CM | POA: Diagnosis not present

## 2020-10-11 DIAGNOSIS — Z79899 Other long term (current) drug therapy: Secondary | ICD-10-CM | POA: Diagnosis not present

## 2020-10-11 DIAGNOSIS — M533 Sacrococcygeal disorders, not elsewhere classified: Secondary | ICD-10-CM | POA: Diagnosis not present

## 2020-10-11 DIAGNOSIS — M545 Low back pain, unspecified: Secondary | ICD-10-CM | POA: Diagnosis not present

## 2020-10-11 DIAGNOSIS — I10 Essential (primary) hypertension: Secondary | ICD-10-CM | POA: Insufficient documentation

## 2020-10-11 DIAGNOSIS — S22080A Wedge compression fracture of T11-T12 vertebra, initial encounter for closed fracture: Secondary | ICD-10-CM | POA: Diagnosis not present

## 2020-10-11 DIAGNOSIS — F0390 Unspecified dementia without behavioral disturbance: Secondary | ICD-10-CM | POA: Insufficient documentation

## 2020-10-11 DIAGNOSIS — S3992XA Unspecified injury of lower back, initial encounter: Secondary | ICD-10-CM | POA: Diagnosis not present

## 2020-10-11 DIAGNOSIS — W19XXXA Unspecified fall, initial encounter: Secondary | ICD-10-CM | POA: Diagnosis not present

## 2020-10-11 DIAGNOSIS — M4316 Spondylolisthesis, lumbar region: Secondary | ICD-10-CM | POA: Diagnosis not present

## 2020-10-11 DIAGNOSIS — W010XXA Fall on same level from slipping, tripping and stumbling without subsequent striking against object, initial encounter: Secondary | ICD-10-CM | POA: Insufficient documentation

## 2020-10-11 DIAGNOSIS — Z950 Presence of cardiac pacemaker: Secondary | ICD-10-CM | POA: Diagnosis not present

## 2020-10-11 DIAGNOSIS — M4313 Spondylolisthesis, cervicothoracic region: Secondary | ICD-10-CM | POA: Diagnosis not present

## 2020-10-11 DIAGNOSIS — I2510 Atherosclerotic heart disease of native coronary artery without angina pectoris: Secondary | ICD-10-CM | POA: Insufficient documentation

## 2020-10-11 DIAGNOSIS — I6782 Cerebral ischemia: Secondary | ICD-10-CM | POA: Diagnosis not present

## 2020-10-11 DIAGNOSIS — R0902 Hypoxemia: Secondary | ICD-10-CM | POA: Diagnosis not present

## 2020-10-11 DIAGNOSIS — S0990XA Unspecified injury of head, initial encounter: Secondary | ICD-10-CM | POA: Diagnosis not present

## 2020-10-11 DIAGNOSIS — S199XXA Unspecified injury of neck, initial encounter: Secondary | ICD-10-CM | POA: Diagnosis not present

## 2020-10-11 DIAGNOSIS — M549 Dorsalgia, unspecified: Secondary | ICD-10-CM | POA: Diagnosis not present

## 2020-10-11 LAB — COMPREHENSIVE METABOLIC PANEL
ALT: 25 U/L (ref 0–44)
AST: 42 U/L — ABNORMAL HIGH (ref 15–41)
Albumin: 3.8 g/dL (ref 3.5–5.0)
Alkaline Phosphatase: 102 U/L (ref 38–126)
Anion gap: 11 (ref 5–15)
BUN: 10 mg/dL (ref 8–23)
CO2: 23 mmol/L (ref 22–32)
Calcium: 8.7 mg/dL — ABNORMAL LOW (ref 8.9–10.3)
Chloride: 98 mmol/L (ref 98–111)
Creatinine, Ser: 0.7 mg/dL (ref 0.44–1.00)
GFR, Estimated: >60 mL/min (ref >60)
Glucose, Bld: 108 mg/dL — ABNORMAL HIGH (ref 70–99)
Potassium: 3.9 mmol/L (ref 3.5–5.1)
Sodium: 132 mmol/L — ABNORMAL LOW (ref 135–145)
Total Bilirubin: 1.4 mg/dL — ABNORMAL HIGH (ref 0.3–1.2)
Total Protein: 7.5 g/dL (ref 6.5–8.1)

## 2020-10-11 LAB — CBC WITH DIFFERENTIAL/PLATELET
Abs Immature Granulocytes: 0.02 10*3/uL (ref 0.00–0.07)
Basophils Absolute: 0 10*3/uL (ref 0.0–0.1)
Basophils Relative: 0 %
Eosinophils Absolute: 0 10*3/uL (ref 0.0–0.5)
Eosinophils Relative: 0 %
HCT: 43.9 % (ref 36.0–46.0)
Hemoglobin: 15.2 g/dL — ABNORMAL HIGH (ref 12.0–15.0)
Immature Granulocytes: 0 %
Lymphocytes Relative: 11 %
Lymphs Abs: 0.8 10*3/uL (ref 0.7–4.0)
MCH: 35.2 pg — ABNORMAL HIGH (ref 26.0–34.0)
MCHC: 34.6 g/dL (ref 30.0–36.0)
MCV: 101.6 fL — ABNORMAL HIGH (ref 80.0–100.0)
Monocytes Absolute: 0.8 10*3/uL (ref 0.1–1.0)
Monocytes Relative: 12 %
Neutro Abs: 5.4 10*3/uL (ref 1.7–7.7)
Neutrophils Relative %: 77 %
Platelets: 176 10*3/uL (ref 150–400)
RBC: 4.32 MIL/uL (ref 3.87–5.11)
RDW: 13.1 % (ref 11.5–15.5)
WBC: 7.1 10*3/uL (ref 4.0–10.5)
nRBC: 0 % (ref 0.0–0.2)

## 2020-10-11 MED ORDER — AMLODIPINE BESYLATE 5 MG PO TABS
5.0000 mg | ORAL_TABLET | Freq: Once | ORAL | Status: AC
  Administered 2020-10-11: 5 mg via ORAL
  Filled 2020-10-11: qty 1

## 2020-10-11 MED ORDER — LIDOCAINE 5 % EX PTCH: 1.0000 | MEDICATED_PATCH | CUTANEOUS | 0 refills | Status: DC

## 2020-10-11 MED ORDER — ACETAMINOPHEN 325 MG PO TABS
650.0000 mg | ORAL_TABLET | Freq: Once | ORAL | Status: AC
  Administered 2020-10-11: 650 mg via ORAL
  Filled 2020-10-11: qty 2

## 2020-10-11 MED ORDER — LIDOCAINE 5 % EX PTCH
1.0000 | MEDICATED_PATCH | CUTANEOUS | Status: DC
  Administered 2020-10-11: 1 via TRANSDERMAL
  Filled 2020-10-11: qty 1

## 2020-10-11 MED ORDER — FENTANYL CITRATE (PF) 100 MCG/2ML IJ SOLN
25.0000 ug | Freq: Once | INTRAMUSCULAR | Status: AC
Start: 2020-10-11 — End: 2020-10-11
  Administered 2020-10-11: 25 ug via INTRAMUSCULAR
  Filled 2020-10-11: qty 2

## 2020-10-11 NOTE — ED Provider Notes (Signed)
[facility] DEPT Provider Note   CSN: [id number] Arrival date & time: 10/11/20  1347     History Chief Complaint  Patient presents with  . Fall    Theresa Norton is a 87 y.o. female presenting for evaluation after a fall.  Level 5 caveat due to dementia.  Patient states she had a fall yesterday.  She cannot remember why she fell.  She states she was on uneven ground, so possibly tripped.  Patient states she is having a lot of low back pain.  This is constant, movement makes it worse.  She denies headache or neck pain.  She denies numbness or tingling.  No chest or abd pain.  Additional history obtained from triage note.  Per triage note, patient was able to stand up and walk yesterday, but today was in increased pain.  She normally ambulates without assistance, but today has been unable to do so due to pain.    HPI     Past Medical History:  Diagnosis Date  . Allergy   . Anemia   . Arthritis    "in my fingers" (07/08/2013)  . CAD (coronary artery disease)    a. 05/2013 nonobs dzs by cath.  . Colon polyps   . Groin hematoma    a. 05/2013 R groin hematoma post-cath - u/s 05/31/13 large 7.49 cm r inguinal hematoma extending into thigh, no psa or avf.  . H/O hiatal hernia   . Heart block AV second degree    a. s/p MDT Addapta dual chamber pacemaker 05/2013 by Dr Rayann Heman.  . Hypertension   . On home oxygen therapy    "2L maybe 12h/day" (07/08/2013)  . Pacemaker   . Pacemaker   . PAF (paroxysmal atrial fibrillation) ([place])    a. Found post-pacer 05/2013->Xarelto added;  b. 05/2013 Echo: EF 60-65%, mild LVH, nl wall motion w/o rwma.    Patient Active Problem List   Diagnosis Date Noted  . Cognitive impairment 12/23/2016  . Balance disorder 06/25/2016  . Edema 05/11/2014  . Pleural effusion 07/07/2013  . Anemia, iron deficiency 06/26/2013  . Hyponatremia 06/26/2013  . Displacement of pacemaker electrode lead 06/13/2013  . Pericardial effusion  with cardiac tamponade 06/11/2013  . AF (atrial fibrillation) ([place]) 06/05/2013  . Complete heart block ([place]) 05/26/2013  . Obesity (BMI 30-39.9) 04/22/2013  . Wenckebach second degree AV block 04/23/2012  . Hypertension 04/17/2012  . Elevated blood pressure 04/10/2012  . History of colon polyps 04/10/2012    Past Surgical History:  Procedure Laterality Date  . BREAST CYST EXCISION Right 1959; 1970's  . BUNIONECTOMY Bilateral 1967  . CARDIAC CATHETERIZATION    . CATARACT EXTRACTION W/ INTRAOCULAR LENS  IMPLANT, BILATERAL Bilateral   . DILATION AND CURETTAGE OF UTERUS    . INSERT / REPLACE / REMOVE PACEMAKER  05/2013   MDT [id number] implanted by Dr Rayann Heman for heart block  . INSERT / REPLACE / REMOVE PACEMAKER  06/2013   Right ventricular lead perforation with tamponade  . LEAD REVISION N/A 06/13/2013   Procedure: LEAD REVISION;  Surgeon: Coralyn Mark, MD;  Location: [facility] CATH [facility];  Service: Cardiovascular;  Laterality: N/A;  . LEFT HEART CATHETERIZATION WITH CORONARY ANGIOGRAM N/A 05/27/2013   Procedure: LEFT HEART CATHETERIZATION WITH CORONARY ANGIOGRAM;  Surgeon: Birdie Riddle, MD;  Location: [facility];  Service: Cardiovascular;  Laterality: N/A;  . PERICARDIAL TAP N/A 06/11/2013   Procedure: PERICARDIAL TAP;  Surgeon: Birdie Riddle, MD;  Location:  [facility] CATH [facility];  Service: Cardiovascular;  Laterality: N/A;  . PERMANENT PACEMAKER INSERTION N/A 05/27/2013   Procedure: PERMANENT PACEMAKER INSERTION;  Surgeon: Coralyn Mark, MD;  Location: [facility];  Service: Cardiovascular;  Laterality: N/A;  . TEMPORARY PACEMAKER INSERTION N/A 05/27/2013   Procedure: TEMPORARY PACEMAKER INSERTION;  Surgeon: Birdie Riddle, MD;  Location: [facility];  Service: Cardiovascular;  Laterality: N/A;  . VARICOSE VEIN SURGERY Bilateral ~ 2002     OB History   No obstetric history on file.     Family History  Problem Relation Age of Onset  . Cancer Father        lung    Social History   Tobacco Use  .  Smoking status: Never Smoker  . Smokeless tobacco: Never Used  Vaping Use  . Vaping Use: Never used  Substance Use Topics  . Alcohol use: Yes    Alcohol/week: 14.0 standard drinks    Types: 7 Glasses of wine, 7 Cans of beer per week    Comment: 2019: wine in the evening   . Drug use: No    Home Medications Prior to Admission medications   Medication Sig Start Date End Date Taking? Authorizing Provider  amLODipine (NORVASC) 5 MG tablet TAKE 1 TABLET BY MOUTH EVERY DAY Patient taking differently: Take 5 mg by mouth daily. 02/04/20  Yes Burchette, Alinda Sierras, MD  beta carotene w/minerals (OCUVITE) tablet Take 1 tablet by mouth daily.   Yes [provider]  donepezil (ARICEPT) 5 MG tablet TAKE 1 TABLET(5 MG) BY MOUTH AT BEDTIME Patient taking differently: Take 5 mg by mouth at bedtime. 03/01/20  Yes Burchette, Alinda Sierras, MD  ibuprofen (ADVIL) 200 MG tablet Take 400 mg by mouth every 6 (six) hours as needed for mild pain.   Yes [provider]  lidocaine (LIDODERM) 5 % Place 1 patch onto the skin daily. Remove & Discard patch within 12 hours or as directed by MD 10/11/20  Yes Shahmeer Bunn, PA-C  loratadine (CLARITIN) 10 MG tablet Take 10 mg by mouth daily.   Yes [provider]  Multiple Vitamin (MULTIVITAMIN ADULT PO) Take 1 tablet by mouth daily.   Yes [provider]  Thiamine HCl (VITAMIN B-1 PO) Take 1 tablet by mouth daily.   Yes [provider]  TURMERIC PO Take 1 capsule by mouth daily.   Yes [provider]  vitamin B-12 (CYANOCOBALAMIN) 500 MCG tablet Take 500 mcg by mouth daily.   Yes [provider]  furosemide (LASIX) 20 MG tablet TAKE 1 TABLET BY MOUTH DAILY AS NEEDED FOR LEG SWELLING Patient not taking: No sig reported 04/20/20   Eulas Post, MD    Allergies    Codeine, Morphine and related, and Other  Review of Systems   Review of Systems  Unable to perform ROS: Dementia  Musculoskeletal: Positive for  back pain.    Physical Exam Updated Vital Signs BP 108/82   Pulse 93   Temp 98.3 F (36.8 C) (Oral)   Resp 16   SpO2 95%   Physical Exam Vitals and nursing note reviewed.  Constitutional:      General: She is not in acute distress.    Appearance: She is well-developed.     Comments: Resting in the bed in NAD  HENT:     Head: Normocephalic and atraumatic.  Eyes:     Extraocular Movements: Extraocular movements intact.     Conjunctiva/sclera: Conjunctivae normal.  Pupils: Pupils are equal, round, and reactive to light.  Neck:     Comments: Mild ttp of the midline c-spine. Pt unable to state if chronic or not Cardiovascular:     Rate and Rhythm: Normal rate and regular rhythm.     Pulses: Normal pulses.  Pulmonary:     Effort: Pulmonary effort is normal. No respiratory distress.     Breath sounds: Normal breath sounds. No wheezing.  Abdominal:     General: There is no distension.     Palpations: Abdomen is soft. There is no mass.     Tenderness: There is no abdominal tenderness. There is no guarding or rebound.  Musculoskeletal:        General: Tenderness present.     Cervical back: Normal range of motion and neck supple.     Comments: ttp of low back. No obvious deformity.  No shortening or external rotation of the lower ext.  Grip strength equal bilaterally  Skin:    General: Skin is warm and dry.     Capillary Refill: Capillary refill takes less than 2 seconds.  Neurological:     Mental Status: She is alert and oriented to person, place, and time.     ED Results / Procedures / Treatments   Labs (all labs ordered are listed, but only abnormal results are displayed) Labs Reviewed  CBC WITH DIFFERENTIAL/PLATELET - Abnormal; Notable for the following components:      Result Value   Hemoglobin 15.2 (*)    MCV 101.6 (*)    MCH 35.2 (*)    All other components within normal limits  COMPREHENSIVE METABOLIC PANEL - Abnormal; Notable for the following components:    Sodium 132 (*)    Glucose, Bld 108 (*)    Calcium 8.7 (*)    AST 42 (*)    Total Bilirubin 1.4 (*)    All other components within normal limits  URINALYSIS, ROUTINE W REFLEX MICROSCOPIC    EKG None  Radiology DG Lumbar Spine Complete  Result Date: 10/11/2020 CLINICAL DATA:  Status post fall. EXAM: LUMBAR SPINE - COMPLETE 4+ VIEW COMPARISON:  None. FINDINGS: There is no evidence of an acute lumbar spine fracture. A chronic compression fracture deformity is seen at the level of T12. Mild levoscoliosis is present. Approximately 1 mm retrolisthesis of the L2 vertebral body is noted on L3, with 1 mm anterolisthesis of L3 on L4. Moderate severity endplate sclerosis is noted at the level of L1-L2. Mild multilevel endplate sclerosis is seen throughout the remainder of the lumbar spine. Moderate severity intervertebral disc space narrowing is seen at the level of L1-L2 and L2-L3 with mild intervertebral disc space narrowing noted throughout the remainder of the lumbar spine. There is marked severity calcification of the abdominal aorta and bilateral common iliac arteries. IMPRESSION: 1. Multilevel degenerative changes, as described above, without an acute osseous abnormality. Electronically Signed   By: Virgina Norfolk M.D.   On: 10/11/2020 16:45   DG Pelvis 1-2 Views  Result Date: 10/11/2020 CLINICAL DATA:  Fall yesterday.  Low back and pelvic pain. EXAM: PELVIS - 1-2 VIEW COMPARISON:  PET-CT 05/26/2020. FINDINGS: The bones appear demineralized. No evidence of acute fracture or dislocation. There are mild degenerative changes at the sacroiliac joints and symphysis pubis. The hip joint spaces appear relatively preserved. Vascular calcifications are noted in the pelvis. IMPRESSION: No evidence of acute pelvic fracture or dislocation. Mild degenerative changes. Electronically Signed   By: Caryl Comes.D.  On: 10/11/2020 16:40   CT Head Wo Contrast  Result Date: 10/11/2020 CLINICAL DATA:  Fall,  head trauma.  History of multiple myeloma. EXAM: CT HEAD WITHOUT CONTRAST CT CERVICAL SPINE WITHOUT CONTRAST TECHNIQUE: Multidetector CT imaging of the head and cervical spine was performed following the standard protocol without intravenous contrast. Multiplanar CT image reconstructions of the cervical spine were also generated. COMPARISON:  CT 04/07/2020 FINDINGS: CT HEAD FINDINGS Brain: No evidence of acute infarction, hemorrhage, hydrocephalus, extra-axial collection or mass lesion/mass effect. Scattered low-density changes within the periventricular and subcortical white matter compatible with chronic microvascular ischemic change. Mild diffuse cerebral volume loss. Vascular: Atherosclerotic calcifications involving the large vessels of the skull base. No unexpected hyperdense vessel. Skull: Diffusely mottled appearance of the calvarium with similar appearance of scattered lucent lesions most notably at the skull vertex. No evidence of acute calvarial fracture. Sinuses/Orbits: No acute finding. Other: Negative for scalp hematoma. CT CERVICAL SPINE FINDINGS Alignment: Unchanged grade 1 anterolisthesis C7 on T1 secondary to degenerative facet arthropathy. Facet joints are aligned without dislocation or traumatic listhesis. Dens and lateral masses are aligned. Skull base and vertebrae: Diffuse osseous demineralization. No evidence of acute fracture. No obvious lucent bone lesion identified, although evaluation is slightly limited secondary to the degree of underlying bony demineralization. Soft tissues and spinal canal: No prevertebral fluid or swelling. No visible canal hematoma. Disc levels: Mild degenerative disc disease most pronounced at C4-5 and C5-6. Multilevel facet and uncovertebral arthropathy. Upper chest: Included lung apices are clear. Other: Bilateral carotid atherosclerosis. IMPRESSION: CT head: 1. No evidence of acute intracranial process. 2. Chronic microvascular ischemic changes and cerebral  volume loss. 3. Diffusely mottled appearance of the calvarium with similar appearance of scattered lucent lesions most notably at the skull vertex, which may represent the patient's known history of multiple myeloma. CT cervical spine: 1. No evidence of acute fracture or traumatic listhesis of the cervical spine. 2. Mild multilevel degenerative disc disease and facet arthropathy of the cervical spine. Electronically Signed   By: Davina Poke D.O.   On: 10/11/2020 18:10   CT Cervical Spine Wo Contrast  Result Date: 10/11/2020 CLINICAL DATA:  Fall, head trauma.  History of multiple myeloma. EXAM: CT HEAD WITHOUT CONTRAST CT CERVICAL SPINE WITHOUT CONTRAST TECHNIQUE: Multidetector CT imaging of the head and cervical spine was performed following the standard protocol without intravenous contrast. Multiplanar CT image reconstructions of the cervical spine were also generated. COMPARISON:  CT 04/07/2020 FINDINGS: CT HEAD FINDINGS Brain: No evidence of acute infarction, hemorrhage, hydrocephalus, extra-axial collection or mass lesion/mass effect. Scattered low-density changes within the periventricular and subcortical white matter compatible with chronic microvascular ischemic change. Mild diffuse cerebral volume loss. Vascular: Atherosclerotic calcifications involving the large vessels of the skull base. No unexpected hyperdense vessel. Skull: Diffusely mottled appearance of the calvarium with similar appearance of scattered lucent lesions most notably at the skull vertex. No evidence of acute calvarial fracture. Sinuses/Orbits: No acute finding. Other: Negative for scalp hematoma. CT CERVICAL SPINE FINDINGS Alignment: Unchanged grade 1 anterolisthesis C7 on T1 secondary to degenerative facet arthropathy. Facet joints are aligned without dislocation or traumatic listhesis. Dens and lateral masses are aligned. Skull base and vertebrae: Diffuse osseous demineralization. No evidence of acute fracture. No obvious  lucent bone lesion identified, although evaluation is slightly limited secondary to the degree of underlying bony demineralization. Soft tissues and spinal canal: No prevertebral fluid or swelling. No visible canal hematoma. Disc levels: Mild degenerative disc disease most pronounced at C4-5 and  C5-6. Multilevel facet and uncovertebral arthropathy. Upper chest: Included lung apices are clear. Other: Bilateral carotid atherosclerosis. IMPRESSION: CT head: 1. No evidence of acute intracranial process. 2. Chronic microvascular ischemic changes and cerebral volume loss. 3. Diffusely mottled appearance of the calvarium with similar appearance of scattered lucent lesions most notably at the skull vertex, which may represent the patient's known history of multiple myeloma. CT cervical spine: 1. No evidence of acute fracture or traumatic listhesis of the cervical spine. 2. Mild multilevel degenerative disc disease and facet arthropathy of the cervical spine. Electronically Signed   By: Davina Poke D.O.   On: 10/11/2020 18:10   CT Lumbar Spine Wo Contrast  Result Date: 10/11/2020 CLINICAL DATA:  Fall yesterday, worsened back pain today EXAM: CT LUMBAR SPINE WITHOUT CONTRAST TECHNIQUE: Multidetector CT imaging of the lumbar spine was performed without intravenous contrast administration. Multiplanar CT image reconstructions were also generated. COMPARISON:  Multiple exams, including radiographs of 10/11/2020 and PET-CT from 05/26/2020 FINDINGS: Segmentation: The lowest lumbar type non-rib-bearing vertebra is labeled as L5. Alignment: Mild levoconvex lumbar scoliosis with rotary component. No significant subluxation. Vertebrae: Small Schmorl's node along the superior endplate of J03. No acute fracture or acute bony findings. Chronic broad Schmorl's node along the inferior endplate of L1. Paraspinal and other soft tissues: Aortoiliac atherosclerotic vascular disease. 0.7 cm fat density lesion along the periphery of the  right kidney, probably a small angiomyolipoma on image 24 series 4. Disc levels: L1-2: Mild right foraminal stenosis due to intervertebral spurring and mild disc bulge. L2-3: Suspected mild central narrowing of the thecal sac due to disc bulge. L3-4: Borderline left foraminal stenosis due to facet spurring. Mild disc bulge. L4-5: Mild left foraminal stenosis due to disc bulge and facet spurring. L5-S1: Unremarkable. IMPRESSION: 1. Lumbar spondylosis and degenerative disc disease causing mild impingement at L1-2, L2-3, and L4-5. 2. Mild levoconvex lumbar scoliosis with rotary component. 3. No acute fracture or subluxation identified. 4.  Aortic Atherosclerosis (ICD10-I70.0). Electronically Signed   By: Van Clines M.D.   On: 10/11/2020 18:19    Procedures Procedures   Medications Ordered in ED Medications  lidocaine (LIDODERM) 5 % 1 patch (1 patch Transdermal Patch Applied 10/11/20 1828)  acetaminophen (TYLENOL) tablet 650 mg (650 mg Oral Given 10/11/20 1828)  amLODipine (NORVASC) tablet 5 mg (5 mg Oral Given 10/11/20 1921)  fentaNYL (SUBLIMAZE) injection 25 mcg (25 mcg Intramuscular Given 10/11/20 2105)    ED Course  I have reviewed the triage vital signs and the nursing notes.  Pertinent labs & imaging results that were available during my care of the patient were reviewed by me and considered in my medical decision making (see chart for details).    MDM Rules/Calculators/A&P                          Patient presented for evaluation after fall yesterday.  On exam, patient peers nontoxic.  No obvious neurologic deficits.  Lower extremities are not shortened or externally rotated.  She does have tenderness palpation of low back.  Consider strain versus compression fracture.  As patient is unable to tell me why or how she fell, will obtain labs, urine, and EKG.  Will obtain imaging of the low back.  If x-rays are negative, she may need CT for occult fracture.  X-rays viewed and independently  interpreted by me, no obvious fracture or dislocation.  There is a significant mount of degeneration/arthritis.  In the setting of  continued severe pain, will obtain CTs for further evaluation.  Labs interpreted by me, overall reassuring.  EKG nonischemic, shows paced rhythm.  CT negative for acute finding including fracture or dislocation.  Discussed findings with patient and daughter.  Discussed symptomatic management.  Patient able to ambulate in the ED without difficulty.  Discussed follow-up with PCP outpatient as needed for further pain control.  At this time, patient appears safe for discharge. return precautions given.  Patient states she understands and agrees to plan  Final Clinical Impression(s) / ED Diagnoses Final diagnoses:  Acute midline low back pain without sciatica  Fall, initial encounter    Rx / DC Orders ED Discharge Orders         Ordered    lidocaine (LIDODERM) 5 %  Every 24 hours        10/11/20 2123           Franchot Heidelberg, PA-C 10/11/20 2154    Dorie Rank, MD 10/12/20 1059

## 2020-10-11 NOTE — ED Notes (Signed)
Fall bundle: Yellow wrist band Yellow socks Bed alarm on  Protection: Face mask

## 2020-10-11 NOTE — ED Notes (Signed)
Family at bedside. 

## 2020-10-11 NOTE — ED Notes (Signed)
Placed female urinal b/w pt's legs. Pt unable to urinate at this time. Daughter at bedside

## 2020-10-11 NOTE — ED Notes (Signed)
Daughter advised that pt lives independently and that she was going to stay with pt

## 2020-10-11 NOTE — Discharge Instructions (Addendum)
Continue taking home medications as prescribed. Take tylenol and/or ibuprofen as needed for pain.  Use the lidoderm patch to help with pain.  You will likely continue to have mus cule stiffness and soreness over the next few days. If you continue to have difficulty walking, follow up with your primary care doctor.  Return to the ER with any new, worsening, or concerning symptoms.

## 2020-10-11 NOTE — ED Notes (Signed)
Notified RN of elevated BP

## 2020-10-11 NOTE — ED Notes (Signed)
Gave pt some ginger ale and graham crackers and she was helped out

## 2020-10-11 NOTE — ED Triage Notes (Addendum)
Patient arrived via [place] from [facility].   C/O fall yesterday. Patient was able to get up after fall yesterday but today woke up in a lot of pain.  C/o lower back pain 7/10   BP- 144/100 P-74 94% RA  A/o at baseline   Patient required assistance with ambulation today. Before fall patient was able to ambulate on her own.

## 2020-10-12 ENCOUNTER — Other Ambulatory Visit: Payer: Self-pay

## 2020-10-13 ENCOUNTER — Encounter: Payer: Self-pay | Admitting: Family Medicine

## 2020-10-13 ENCOUNTER — Ambulatory Visit (INDEPENDENT_AMBULATORY_CARE_PROVIDER_SITE_OTHER): Payer: Medicare Other | Admitting: Family Medicine

## 2020-10-13 VITALS — BP 124/70 | HR 89 | Temp 97.5°F

## 2020-10-13 DIAGNOSIS — M545 Low back pain, unspecified: Secondary | ICD-10-CM

## 2020-10-13 DIAGNOSIS — D472 Monoclonal gammopathy: Secondary | ICD-10-CM | POA: Diagnosis not present

## 2020-10-13 DIAGNOSIS — R930 Abnormal findings on diagnostic imaging of skull and head, not elsewhere classified: Secondary | ICD-10-CM

## 2020-10-13 MED ORDER — TRAMADOL HCL 50 MG PO TABS: 50.0000 mg | ORAL_TABLET | Freq: Four times a day (QID) | ORAL | 0 refills | Status: DC | PRN

## 2020-10-13 NOTE — Progress Notes (Signed)
Established Patient Office Visit  Subjective:  Patient ID: Theresa Norton, female    DOB: [birth year]  Age: 87 y.o. MRN: [id number]  CC:  Chief Complaint  Patient presents with  . Hospitalization Follow-up    [place], seen in ED, woke up the next day and was very sore, unable to move much    HPI Theresa Norton presents for ER follow-up.  She is accompanied by her daughter Theresa Norton today.  Patient resides in assisted living complex but is currently staying with daughter for couple days.  Patient has chronic problems including history of atrial fibrillation, hypertension, cognitive impairment with probable dementia, history of complete heart block  She was seen in the ER 2 days ago after a fall that occurred the day prior.  Patient apparently tripped and fell at her daughter's house.  There is no obvious head injury.  She complained of low back pain.  Movement made her pain worse.  Denies any headache.  No chest pains or abdominal pain.  Ambulates some with a walker but with pain.  Was not complaining of any extremity pain.  She had lab work significant for hemoglobin 15.2 with normal white blood count and normal platelets.  Sodium 132, calcium 8.7, AST 42. She had extensive imaging with plain films of the pelvis and lumbar spine which showed no acute bony abnormalities.  She had CT scans of lumbar spine, head, and cervical spine which showed no acute fracture.  She had degenerative arthritis findings at multiple levels.  Her complaints now are low back pain.  They have tried Tylenol, ibuprofen, heat, IcyHot topical and over-the-counter lidocaine patch without much improvement.  Daughter states that she seems uncomfortable at times.  There is no evidence for compression fracture from recent x-rays.  They are trying to get her involved with some physical therapy at her place of residence.  Recent history is that she had abnormal skull x-ray back in December with multiple lucent lesions.  We  obtain further work-up and she had monoclonal paraproteinemia with increased kappa/lambda light chain ratio but normal calcium and normal renal function.  PET scan did not show any evidence for metastatic lesions or any bone tumors.  Oncologist had suggested follow-up labs every 6 months.  Family has decided not to follow-up with oncology at this time.  Past Medical History:  Diagnosis Date  . Allergy   . Anemia   . Arthritis    "in my fingers" (07/08/2013)  . CAD (coronary artery disease)    a. 05/2013 nonobs dzs by cath.  . Colon polyps   . Groin hematoma    a. 05/2013 R groin hematoma post-cath - u/s 05/31/13 large 7.49 cm r inguinal hematoma extending into thigh, no psa or avf.  . H/O hiatal hernia   . Heart block AV second degree    a. s/p MDT Addapta dual chamber pacemaker 05/2013 by Dr Rayann Heman.  . Hypertension   . On home oxygen therapy    "2L maybe 12h/day" (07/08/2013)  . Pacemaker   . Pacemaker   . PAF (paroxysmal atrial fibrillation) ([place])    a. Found post-pacer 05/2013->Xarelto added;  b. 05/2013 Echo: EF 60-65%, mild LVH, nl wall motion w/o rwma.    Past Surgical History:  Procedure Laterality Date  . BREAST CYST EXCISION Right 1959; 1970's  . BUNIONECTOMY Bilateral 1967  . CARDIAC CATHETERIZATION    . CATARACT EXTRACTION W/ INTRAOCULAR LENS  IMPLANT, BILATERAL Bilateral   . DILATION AND CURETTAGE OF UTERUS    .  INSERT / REPLACE / REMOVE PACEMAKER  05/2013   MDT [id number] implanted by Dr Rayann Heman for heart block  . INSERT / REPLACE / REMOVE PACEMAKER  06/2013   Right ventricular lead perforation with tamponade  . LEAD REVISION N/A 06/13/2013   Procedure: LEAD REVISION;  Surgeon: Coralyn Mark, MD;  Location: [facility];  Service: Cardiovascular;  Laterality: N/A;  . LEFT HEART CATHETERIZATION WITH CORONARY ANGIOGRAM N/A 05/27/2013   Procedure: LEFT HEART CATHETERIZATION WITH CORONARY ANGIOGRAM;  Surgeon: Birdie Riddle, MD;  Location: [facility];  Service: Cardiovascular;   Laterality: N/A;  . PERICARDIAL TAP N/A 06/11/2013   Procedure: PERICARDIAL TAP;  Surgeon: Birdie Riddle, MD;  Location: [facility];  Service: Cardiovascular;  Laterality: N/A;  . PERMANENT PACEMAKER INSERTION N/A 05/27/2013   Procedure: PERMANENT PACEMAKER INSERTION;  Surgeon: Coralyn Mark, MD;  Location: [facility];  Service: Cardiovascular;  Laterality: N/A;  . TEMPORARY PACEMAKER INSERTION N/A 05/27/2013   Procedure: TEMPORARY PACEMAKER INSERTION;  Surgeon: Birdie Riddle, MD;  Location: [facility];  Service: Cardiovascular;  Laterality: N/A;  . VARICOSE VEIN SURGERY Bilateral ~ 2002    Family History  Problem Relation Age of Onset  . Cancer Father        lung    Social History   Socioeconomic History  . Marital status: Widowed    Spouse name: Not on file  . Number of children: Not on file  . Years of education: Not on file  . Highest education level: Not on file  Occupational History  . Not on file  Tobacco Use  . Smoking status: Never Smoker  . Smokeless tobacco: Never Used  Vaping Use  . Vaping Use: Never used  Substance and Sexual Activity  . Alcohol use: Yes    Alcohol/week: 14.0 standard drinks    Types: 7 Glasses of wine, 7 Cans of beer per week    Comment: 2019: wine in the evening   . Drug use: No  . Sexual activity: Never  Other Topics Concern  . Not on file  Social History Narrative   Living at [place]: Low Risk   . Difficulty of Paying Living Expenses: Not hard at all  Food Insecurity: No Food Insecurity  . Worried About Charity fundraiser in the Last Year: Never true  . Ran Out of Food in the Last Year: Never true  Transportation Needs: No Transportation Needs  . Lack of Transportation (Medical): No  . Lack of Transportation (Non-Medical): No  Physical Activity: Inactive  . Days of Exercise per Week: 0 days  . Minutes of Exercise per Session: 0 min  Stress: No Stress Concern  Present  . Feeling of Stress : Not at all  Social Connections: Socially Isolated  . Frequency of Communication with Friends and Family: More than three times a week  . Frequency of Social Gatherings with Friends and Family: Three times a week  . Attends Religious Services: Never  . Active Member of Clubs or Organizations: No  . Attends Archivist Meetings: Never  . Marital Status: Widowed  Intimate Partner Violence: Not At Risk  . Fear of Current or Ex-Partner: No  . Emotionally Abused: No  . Physically Abused: No  . Sexually Abused: No    Outpatient Medications Prior to Visit  Medication Sig Dispense Refill  . amLODipine (NORVASC) 5 MG tablet TAKE 1 TABLET BY  MOUTH EVERY DAY (Patient taking differently: Take 5 mg by mouth daily.) 90 tablet 0  . beta carotene w/minerals (OCUVITE) tablet Take 1 tablet by mouth daily.    Marland Kitchen donepezil (ARICEPT) 5 MG tablet TAKE 1 TABLET(5 MG) BY MOUTH AT BEDTIME (Patient taking differently: Take 5 mg by mouth at bedtime.) 30 tablet 1  . furosemide (LASIX) 20 MG tablet TAKE 1 TABLET BY MOUTH DAILY AS NEEDED FOR LEG SWELLING 90 tablet 0  . ibuprofen (ADVIL) 200 MG tablet Take 400 mg by mouth every 6 (six) hours as needed for mild pain.    Marland Kitchen lidocaine (LIDODERM) 5 % Place 1 patch onto the skin daily. Remove & Discard patch within 12 hours or as directed by MD 30 patch 0  . loratadine (CLARITIN) 10 MG tablet Take 10 mg by mouth daily.    . Multiple Vitamin (MULTIVITAMIN ADULT PO) Take 1 tablet by mouth daily.    . Thiamine HCl (VITAMIN B-1 PO) Take 1 tablet by mouth daily.    . TURMERIC PO Take 1 capsule by mouth daily.    . vitamin B-12 (CYANOCOBALAMIN) 500 MCG tablet Take 500 mcg by mouth daily.     No facility-administered medications prior to visit.    Allergies  Allergen Reactions  . Codeine     GI upset  . Morphine And Related Other (See Comments)    GI upset GI upset  . Other Other (See Comments)    Pain killers, unknown,      ROS Review of Systems  Constitutional: Negative for chills, fever and unexpected weight change.  Respiratory: Negative for cough and shortness of breath.   Cardiovascular: Negative for chest pain.  Gastrointestinal: Negative for abdominal pain.  Genitourinary: Negative for dysuria.  Neurological: Negative for dizziness, seizures, syncope, speech difficulty, weakness and headaches.      Objective:    Physical Exam Vitals reviewed.  Constitutional:      Appearance: Normal appearance.  HENT:     Head: Normocephalic and atraumatic.  Cardiovascular:     Rate and Rhythm: Normal rate.  Pulmonary:     Effort: Pulmonary effort is normal.     Breath sounds: Normal breath sounds.  Musculoskeletal:     Comments: Exam reveals no local tenderness.  She is sitting in a wheelchair.  Did not attempt to ambulate.  No extremity tenderness involving the upper or lower extremities.  Neurological:     Mental Status: She is alert.     BP 124/70 (BP Location: Left Arm, Patient Position: Sitting, Cuff Size: Normal)   Pulse 89   Temp (!) 97.5 F (36.4 C) (Oral)   SpO2 97%  Wt Readings from Last 3 Encounters:  05/10/20 172 lb 8 oz (78.2 kg)  04/20/20 175 lb 14.4 oz (79.8 kg)  04/07/20 180 lb (81.6 kg)     Health Maintenance Due  Topic Date Due  . Pneumococcal Vaccine 23-65 Years old (1 of 4 - PCV13) Never done  . Zoster Vaccines- Shingrix (1 of 2) Never done  . DEXA SCAN  Never done  . PNA vac Low Risk Adult (1 of 2 - PCV13) Never done  . TETANUS/TDAP  04/11/2007  . COVID-19 Vaccine (4 - Booster for Moderna series) 06/04/2020    There are no preventive care reminders to display for this patient.  Lab Results  Component Value Date   TSH 1.11 09/14/2015   Lab Results  Component Value Date   WBC 7.1 10/11/2020   HGB 15.2 (H)  10/11/2020   HCT 43.9 10/11/2020   MCV 101.6 (H) 10/11/2020   PLT 176 10/11/2020   Lab Results  Component Value Date   NA 132 (L) 10/11/2020   K 3.9  10/11/2020   CO2 23 10/11/2020   GLUCOSE 108 (H) 10/11/2020   BUN 10 10/11/2020   CREATININE 0.70 10/11/2020   BILITOT 1.4 (H) 10/11/2020   ALKPHOS 102 10/11/2020   AST 42 (H) 10/11/2020   ALT 25 10/11/2020   PROT 7.5 10/11/2020   ALBUMIN 3.8 10/11/2020   CALCIUM 8.7 (L) 10/11/2020   ANIONGAP 11 10/11/2020   GFR 62.08 03/11/2018   Lab Results  Component Value Date   CHOL 232 (H) 04/17/2012   Lab Results  Component Value Date   HDL 96.80 04/17/2012   No results found for: [facility] Lab Results  Component Value Date   TRIG 136.0 04/17/2012   Lab Results  Component Value Date   CHOLHDL 2 04/17/2012   No results found for: HGBA1C    Assessment & Plan:   #1 fall with low back pain.  Patient had extensive imaging which showed degenerative changes of the back but no evidence for fracture. -Continue Tylenol as needed. -Continue heat or ice as needed -Avoid nonsteroidals with her age -Wrote for limited tramadol 1 every 6 hours as needed for severe pain as daughter states she seems to be very uncomfortable even with the Tylenol.  She is aware that tramadol may cause some sedation and dizziness among other side effects.  She will be supervised closely while on this -We will look at signing orders for starting some physical therapy at her place of residence.  #2 abnormal skull x-ray with multiple lucent lesions with labs showing monoclonal paraproteinemia.  She did not have picture entirely consistent with multi myeloma with normal calcium and normal renal function. -Recommend follow-up in 1 month and consider follow-up labs at that time  Meds ordered this encounter  Medications  . traMADol (ULTRAM) 50 MG tablet    Sig: Take 1 tablet (50 mg total) by mouth every 6 (six) hours as needed for up to 5 days.    Dispense:  30 tablet    Refill:  0    Follow-up: Return in about 1 month (around 11/12/2020).    Carolann Littler, MD

## 2020-10-13 NOTE — Patient Instructions (Signed)
Acute Back Pain, Adult Acute back pain is sudden and usually short-lived. It is often caused by an injury to the muscles and tissues in the back. The injury may result from:  A muscle or ligament getting overstretched or torn (strained). Ligaments are tissues that connect bones to each other. Lifting something improperly can cause a back strain.  Wear and tear (degeneration) of the spinal disks. Spinal disks are circular tissue that provide cushioning between the bones of the spine (vertebrae).  Twisting motions, such as while playing sports or doing yard work.  A hit to the back.  Arthritis. You may have a physical exam, lab tests, and imaging tests to find the cause of your pain. Acute back pain usually goes away with rest and home care. Follow these instructions at home: Managing pain, stiffness, and swelling  Treatment may include medicines for pain and inflammation that are taken by mouth or applied to the skin, prescription pain medicine, or muscle relaxants. Take over-the-counter and prescription medicines only as told by your health care provider.  Your health care provider may recommend applying ice during the first 24-48 hours after your pain starts. To do this: ? Put ice in a plastic bag. ? Place a towel between your skin and the bag. ? Leave the ice on for 20 minutes, 2-3 times a day.  If directed, apply heat to the affected area as often as told by your health care provider. Use the heat source that your health care provider recommends, such as a moist heat pack or a heating pad. ? Place a towel between your skin and the heat source. ? Leave the heat on for 20-30 minutes. ? Remove the heat if your skin turns bright red. This is especially important if you are unable to feel pain, heat, or cold. You have a greater risk of getting burned. Activity  Do not stay in bed. Staying in bed for more than 1-2 days can delay your recovery.  Sit up and stand up straight. Avoid leaning  forward when you sit or hunching over when you stand. ? If you work at a desk, sit close to it so you do not need to lean over. Keep your chin tucked in. Keep your neck drawn back, and keep your elbows bent at a 90-degree angle (right angle). ? Sit high and close to the steering wheel when you drive. Add lower back (lumbar) support to your car seat, if needed.  Take short walks on even surfaces as soon as you are able. Try to increase the length of time you walk each day.  Do not sit, drive, or stand in one place for more than 30 minutes at a time. Sitting or standing for long periods of time can put stress on your back.  Do not drive or use heavy machinery while taking prescription pain medicine.  Use proper lifting techniques. When you bend and lift, use positions that put less stress on your back: ? Bend your knees. ? Keep the load close to your body. ? Avoid twisting.  Exercise regularly as told by your health care provider. Exercising helps your back heal faster and helps prevent back injuries by keeping muscles strong and flexible.  Work with a physical therapist to make a safe exercise program, as recommended by your health care provider. Do any exercises as told by your physical therapist.   Lifestyle  Maintain a healthy weight. Extra weight puts stress on your back and makes it difficult to have   good posture.  Avoid activities or situations that make you feel anxious or stressed. Stress and anxiety increase muscle tension and can make back pain worse. Learn ways to manage anxiety and stress, such as through exercise. General instructions  Sleep on a firm mattress in a comfortable position. Try lying on your side with your knees slightly bent. If you lie on your back, put a pillow under your knees.  Follow your treatment plan as told by your health care provider. This may include: ? Cognitive or behavioral therapy. ? Acupuncture or massage therapy. ? Meditation or yoga. Contact  a health care provider if:  You have pain that is not relieved with rest or medicine.  You have increasing pain going down into your legs or buttocks.  Your pain does not improve after 2 weeks.  You have pain at night.  You lose weight without trying.  You have a fever or chills. Get help right away if:  You develop new bowel or bladder control problems.  You have unusual weakness or numbness in your arms or legs.  You develop nausea or vomiting.  You develop abdominal pain.  You feel faint. Summary  Acute back pain is sudden and usually short-lived.  Use proper lifting techniques. When you bend and lift, use positions that put less stress on your back.  Take over-the-counter and prescription medicines and apply heat or ice as directed by your health care provider. This information is not intended to replace advice given to you by your health care provider. Make sure you discuss any questions you have with your health care provider. Document Revised: 01/16/2020 Document Reviewed: 01/16/2020 Elsevier Patient Education  2021 Elsevier Inc.  

## 2020-10-15 ENCOUNTER — Telehealth: Payer: Self-pay | Admitting: Family Medicine

## 2020-10-15 NOTE — Telephone Encounter (Signed)
Pts daughter called to ask if we have received any pw from [facility].  I asked Ria Comment if she had it and she stated that the pw that she has was on the providers desk and he was in a meeting unable to check.  Daughter stated that she will call back later to see if we have received it.

## 2020-10-18 ENCOUNTER — Telehealth: Payer: Self-pay | Admitting: Family Medicine

## 2020-10-18 DIAGNOSIS — Z20828 Contact with and (suspected) exposure to other viral communicable diseases: Secondary | ICD-10-CM | POA: Diagnosis not present

## 2020-10-18 NOTE — Telephone Encounter (Signed)
Rosemary a Medical sales representative  is calling and wanted to see if provider has received an order for patient to receive physical therapy, please advise. CB is [phone]

## 2020-10-18 NOTE — Telephone Encounter (Signed)
Spoke with [name]. She is aware we have not received any paper work and will call [facility] to have ir re-faxed. Nothing further needed at this time.

## 2020-10-18 NOTE — Telephone Encounter (Signed)
Forms have been received and placed on Dr. Anastasio Auerbach desk in the red folder,   Spoke with rosemary, she is aware we have received the orders and that we are waiting for them to be completed. Nothing further needed at this time.

## 2020-10-18 NOTE — Telephone Encounter (Incomplete Revision)
Rosemary a Medical sales representative  is calling and wanted to see if provider has received an order for patient to receive physical therapy, please advise. CB is [phone]

## 2020-10-18 NOTE — Telephone Encounter (Signed)
Francesca Jewett called from [employer] at [employer] to follow up on orders that she has been trying to send for the past two weeks.  I checked Dr. Erick Blinks red folder up front and there is 2 faxes for this patient.  [employer] at [employer] [facility] [place]

## 2020-10-18 NOTE — Telephone Encounter (Signed)
We have not received this paperwork. Please see other phone note for further documentation.

## 2020-10-20 DIAGNOSIS — M6281 Muscle weakness (generalized): Secondary | ICD-10-CM | POA: Diagnosis not present

## 2020-10-20 DIAGNOSIS — R262 Difficulty in walking, not elsewhere classified: Secondary | ICD-10-CM | POA: Diagnosis not present

## 2020-10-20 DIAGNOSIS — R296 Repeated falls: Secondary | ICD-10-CM | POA: Diagnosis not present

## 2020-10-20 DIAGNOSIS — R2681 Unsteadiness on feet: Secondary | ICD-10-CM | POA: Diagnosis not present

## 2020-10-21 DIAGNOSIS — M6281 Muscle weakness (generalized): Secondary | ICD-10-CM | POA: Diagnosis not present

## 2020-10-21 DIAGNOSIS — R262 Difficulty in walking, not elsewhere classified: Secondary | ICD-10-CM | POA: Diagnosis not present

## 2020-10-21 DIAGNOSIS — R296 Repeated falls: Secondary | ICD-10-CM | POA: Diagnosis not present

## 2020-10-21 DIAGNOSIS — R2681 Unsteadiness on feet: Secondary | ICD-10-CM | POA: Diagnosis not present

## 2020-10-22 DIAGNOSIS — R262 Difficulty in walking, not elsewhere classified: Secondary | ICD-10-CM | POA: Diagnosis not present

## 2020-10-22 DIAGNOSIS — R296 Repeated falls: Secondary | ICD-10-CM | POA: Diagnosis not present

## 2020-10-22 DIAGNOSIS — M6281 Muscle weakness (generalized): Secondary | ICD-10-CM | POA: Diagnosis not present

## 2020-10-22 DIAGNOSIS — R2681 Unsteadiness on feet: Secondary | ICD-10-CM | POA: Diagnosis not present

## 2020-10-23 DIAGNOSIS — R296 Repeated falls: Secondary | ICD-10-CM | POA: Diagnosis not present

## 2020-10-23 DIAGNOSIS — R2681 Unsteadiness on feet: Secondary | ICD-10-CM | POA: Diagnosis not present

## 2020-10-23 DIAGNOSIS — R262 Difficulty in walking, not elsewhere classified: Secondary | ICD-10-CM | POA: Diagnosis not present

## 2020-10-23 DIAGNOSIS — M6281 Muscle weakness (generalized): Secondary | ICD-10-CM | POA: Diagnosis not present

## 2020-10-24 ENCOUNTER — Other Ambulatory Visit: Payer: Self-pay | Admitting: Family Medicine

## 2020-10-25 DIAGNOSIS — R2681 Unsteadiness on feet: Secondary | ICD-10-CM | POA: Diagnosis not present

## 2020-10-25 DIAGNOSIS — R296 Repeated falls: Secondary | ICD-10-CM | POA: Diagnosis not present

## 2020-10-25 DIAGNOSIS — M6281 Muscle weakness (generalized): Secondary | ICD-10-CM | POA: Diagnosis not present

## 2020-10-25 DIAGNOSIS — R262 Difficulty in walking, not elsewhere classified: Secondary | ICD-10-CM | POA: Diagnosis not present

## 2020-10-25 DIAGNOSIS — Z20828 Contact with and (suspected) exposure to other viral communicable diseases: Secondary | ICD-10-CM | POA: Diagnosis not present

## 2020-10-25 NOTE — Telephone Encounter (Signed)
Rx is not on the patients current med list. Please advise.

## 2020-10-25 NOTE — Telephone Encounter (Signed)
She was given this recently for acute injury.  Will refill once but not to be maintained as chronic medication.

## 2020-10-27 DIAGNOSIS — R262 Difficulty in walking, not elsewhere classified: Secondary | ICD-10-CM | POA: Diagnosis not present

## 2020-10-27 DIAGNOSIS — M6281 Muscle weakness (generalized): Secondary | ICD-10-CM | POA: Diagnosis not present

## 2020-10-27 DIAGNOSIS — R296 Repeated falls: Secondary | ICD-10-CM | POA: Diagnosis not present

## 2020-10-27 DIAGNOSIS — R2681 Unsteadiness on feet: Secondary | ICD-10-CM | POA: Diagnosis not present

## 2020-10-28 DIAGNOSIS — R296 Repeated falls: Secondary | ICD-10-CM | POA: Diagnosis not present

## 2020-10-28 DIAGNOSIS — Z20828 Contact with and (suspected) exposure to other viral communicable diseases: Secondary | ICD-10-CM | POA: Diagnosis not present

## 2020-10-28 DIAGNOSIS — R262 Difficulty in walking, not elsewhere classified: Secondary | ICD-10-CM | POA: Diagnosis not present

## 2020-10-28 DIAGNOSIS — R2681 Unsteadiness on feet: Secondary | ICD-10-CM | POA: Diagnosis not present

## 2020-10-28 DIAGNOSIS — M6281 Muscle weakness (generalized): Secondary | ICD-10-CM | POA: Diagnosis not present

## 2020-10-29 DIAGNOSIS — M6281 Muscle weakness (generalized): Secondary | ICD-10-CM | POA: Diagnosis not present

## 2020-10-29 DIAGNOSIS — R296 Repeated falls: Secondary | ICD-10-CM | POA: Diagnosis not present

## 2020-10-29 DIAGNOSIS — R262 Difficulty in walking, not elsewhere classified: Secondary | ICD-10-CM | POA: Diagnosis not present

## 2020-10-29 DIAGNOSIS — R2681 Unsteadiness on feet: Secondary | ICD-10-CM | POA: Diagnosis not present

## 2020-11-01 DIAGNOSIS — Z20828 Contact with and (suspected) exposure to other viral communicable diseases: Secondary | ICD-10-CM | POA: Diagnosis not present

## 2020-11-01 DIAGNOSIS — R296 Repeated falls: Secondary | ICD-10-CM | POA: Diagnosis not present

## 2020-11-01 DIAGNOSIS — R262 Difficulty in walking, not elsewhere classified: Secondary | ICD-10-CM | POA: Diagnosis not present

## 2020-11-01 DIAGNOSIS — R2681 Unsteadiness on feet: Secondary | ICD-10-CM | POA: Diagnosis not present

## 2020-11-01 DIAGNOSIS — M6281 Muscle weakness (generalized): Secondary | ICD-10-CM | POA: Diagnosis not present

## 2020-11-02 DIAGNOSIS — M6281 Muscle weakness (generalized): Secondary | ICD-10-CM | POA: Diagnosis not present

## 2020-11-02 DIAGNOSIS — R2681 Unsteadiness on feet: Secondary | ICD-10-CM | POA: Diagnosis not present

## 2020-11-02 DIAGNOSIS — R262 Difficulty in walking, not elsewhere classified: Secondary | ICD-10-CM | POA: Diagnosis not present

## 2020-11-02 DIAGNOSIS — R296 Repeated falls: Secondary | ICD-10-CM | POA: Diagnosis not present

## 2020-11-03 DIAGNOSIS — R296 Repeated falls: Secondary | ICD-10-CM | POA: Diagnosis not present

## 2020-11-03 DIAGNOSIS — M6281 Muscle weakness (generalized): Secondary | ICD-10-CM | POA: Diagnosis not present

## 2020-11-03 DIAGNOSIS — R2681 Unsteadiness on feet: Secondary | ICD-10-CM | POA: Diagnosis not present

## 2020-11-03 DIAGNOSIS — R262 Difficulty in walking, not elsewhere classified: Secondary | ICD-10-CM | POA: Diagnosis not present

## 2020-11-04 DIAGNOSIS — Z20828 Contact with and (suspected) exposure to other viral communicable diseases: Secondary | ICD-10-CM | POA: Diagnosis not present

## 2020-11-05 DIAGNOSIS — R262 Difficulty in walking, not elsewhere classified: Secondary | ICD-10-CM | POA: Diagnosis not present

## 2020-11-05 DIAGNOSIS — R296 Repeated falls: Secondary | ICD-10-CM | POA: Diagnosis not present

## 2020-11-05 DIAGNOSIS — M6281 Muscle weakness (generalized): Secondary | ICD-10-CM | POA: Diagnosis not present

## 2020-11-05 DIAGNOSIS — R2681 Unsteadiness on feet: Secondary | ICD-10-CM | POA: Diagnosis not present

## 2020-11-06 DIAGNOSIS — R262 Difficulty in walking, not elsewhere classified: Secondary | ICD-10-CM | POA: Diagnosis not present

## 2020-11-06 DIAGNOSIS — R2681 Unsteadiness on feet: Secondary | ICD-10-CM | POA: Diagnosis not present

## 2020-11-06 DIAGNOSIS — R296 Repeated falls: Secondary | ICD-10-CM | POA: Diagnosis not present

## 2020-11-06 DIAGNOSIS — M6281 Muscle weakness (generalized): Secondary | ICD-10-CM | POA: Diagnosis not present

## 2020-11-09 DIAGNOSIS — M6281 Muscle weakness (generalized): Secondary | ICD-10-CM | POA: Diagnosis not present

## 2020-11-09 DIAGNOSIS — R296 Repeated falls: Secondary | ICD-10-CM | POA: Diagnosis not present

## 2020-11-09 DIAGNOSIS — R262 Difficulty in walking, not elsewhere classified: Secondary | ICD-10-CM | POA: Diagnosis not present

## 2020-11-09 DIAGNOSIS — R2681 Unsteadiness on feet: Secondary | ICD-10-CM | POA: Diagnosis not present

## 2020-11-09 DIAGNOSIS — Z20828 Contact with and (suspected) exposure to other viral communicable diseases: Secondary | ICD-10-CM | POA: Diagnosis not present

## 2020-11-10 DIAGNOSIS — R296 Repeated falls: Secondary | ICD-10-CM | POA: Diagnosis not present

## 2020-11-10 DIAGNOSIS — R262 Difficulty in walking, not elsewhere classified: Secondary | ICD-10-CM | POA: Diagnosis not present

## 2020-11-10 DIAGNOSIS — R2681 Unsteadiness on feet: Secondary | ICD-10-CM | POA: Diagnosis not present

## 2020-11-10 DIAGNOSIS — M6281 Muscle weakness (generalized): Secondary | ICD-10-CM | POA: Diagnosis not present

## 2020-11-11 ENCOUNTER — Ambulatory Visit (INDEPENDENT_AMBULATORY_CARE_PROVIDER_SITE_OTHER): Payer: Medicare Other

## 2020-11-11 DIAGNOSIS — M6281 Muscle weakness (generalized): Secondary | ICD-10-CM | POA: Diagnosis not present

## 2020-11-11 DIAGNOSIS — Z20828 Contact with and (suspected) exposure to other viral communicable diseases: Secondary | ICD-10-CM | POA: Diagnosis not present

## 2020-11-11 DIAGNOSIS — I442 Atrioventricular block, complete: Secondary | ICD-10-CM | POA: Diagnosis not present

## 2020-11-11 DIAGNOSIS — R262 Difficulty in walking, not elsewhere classified: Secondary | ICD-10-CM | POA: Diagnosis not present

## 2020-11-11 DIAGNOSIS — R296 Repeated falls: Secondary | ICD-10-CM | POA: Diagnosis not present

## 2020-11-11 DIAGNOSIS — R2681 Unsteadiness on feet: Secondary | ICD-10-CM | POA: Diagnosis not present

## 2020-11-12 DIAGNOSIS — R296 Repeated falls: Secondary | ICD-10-CM | POA: Diagnosis not present

## 2020-11-12 DIAGNOSIS — R262 Difficulty in walking, not elsewhere classified: Secondary | ICD-10-CM | POA: Diagnosis not present

## 2020-11-12 DIAGNOSIS — R2681 Unsteadiness on feet: Secondary | ICD-10-CM | POA: Diagnosis not present

## 2020-11-12 DIAGNOSIS — M6281 Muscle weakness (generalized): Secondary | ICD-10-CM | POA: Diagnosis not present

## 2020-11-13 LAB — CUP PACEART REMOTE DEVICE CHECK
Battery Impedance: 1019 Ohm
Battery Remaining Longevity: 56 mo
Battery Voltage: 2.77 V
Brady Statistic AP VP Percent: 1 %
Brady Statistic AP VS Percent: 0 %
Brady Statistic AS VP Percent: 99 %
Brady Statistic AS VS Percent: 1 %
Date Time Interrogation Session: 20220707174031
Implantable Lead Implant Date: 20150120
Implantable Lead Implant Date: 20150120
Implantable Lead Location: 753859
Implantable Lead Location: 753860
Implantable Lead Model: 5076
Implantable Lead Model: 5076
Implantable Pulse Generator Implant Date: 20150120
Lead Channel Impedance Value: 498 Ohm
Lead Channel Impedance Value: 502 Ohm
Lead Channel Pacing Threshold Amplitude: 0.375 V
Lead Channel Pacing Threshold Amplitude: 0.75 V
Lead Channel Pacing Threshold Pulse Width: 0.4 ms
Lead Channel Pacing Threshold Pulse Width: 0.4 ms
Lead Channel Setting Pacing Amplitude: 2 V
Lead Channel Setting Pacing Amplitude: 2.5 V
Lead Channel Setting Pacing Pulse Width: 0.4 ms
Lead Channel Setting Sensing Sensitivity: 2.8 mV

## 2020-11-15 DIAGNOSIS — R296 Repeated falls: Secondary | ICD-10-CM | POA: Diagnosis not present

## 2020-11-15 DIAGNOSIS — R262 Difficulty in walking, not elsewhere classified: Secondary | ICD-10-CM | POA: Diagnosis not present

## 2020-11-15 DIAGNOSIS — M6281 Muscle weakness (generalized): Secondary | ICD-10-CM | POA: Diagnosis not present

## 2020-11-15 DIAGNOSIS — R2681 Unsteadiness on feet: Secondary | ICD-10-CM | POA: Diagnosis not present

## 2020-11-16 DIAGNOSIS — R2681 Unsteadiness on feet: Secondary | ICD-10-CM | POA: Diagnosis not present

## 2020-11-16 DIAGNOSIS — R296 Repeated falls: Secondary | ICD-10-CM | POA: Diagnosis not present

## 2020-11-16 DIAGNOSIS — M6281 Muscle weakness (generalized): Secondary | ICD-10-CM | POA: Diagnosis not present

## 2020-11-16 DIAGNOSIS — R262 Difficulty in walking, not elsewhere classified: Secondary | ICD-10-CM | POA: Diagnosis not present

## 2020-11-17 DIAGNOSIS — R2681 Unsteadiness on feet: Secondary | ICD-10-CM | POA: Diagnosis not present

## 2020-11-17 DIAGNOSIS — M6281 Muscle weakness (generalized): Secondary | ICD-10-CM | POA: Diagnosis not present

## 2020-11-17 DIAGNOSIS — R262 Difficulty in walking, not elsewhere classified: Secondary | ICD-10-CM | POA: Diagnosis not present

## 2020-11-17 DIAGNOSIS — R296 Repeated falls: Secondary | ICD-10-CM | POA: Diagnosis not present

## 2020-11-18 DIAGNOSIS — M6281 Muscle weakness (generalized): Secondary | ICD-10-CM | POA: Diagnosis not present

## 2020-11-18 DIAGNOSIS — R296 Repeated falls: Secondary | ICD-10-CM | POA: Diagnosis not present

## 2020-11-18 DIAGNOSIS — R262 Difficulty in walking, not elsewhere classified: Secondary | ICD-10-CM | POA: Diagnosis not present

## 2020-11-18 DIAGNOSIS — R2681 Unsteadiness on feet: Secondary | ICD-10-CM | POA: Diagnosis not present

## 2020-11-19 ENCOUNTER — Telehealth (INDEPENDENT_AMBULATORY_CARE_PROVIDER_SITE_OTHER): Payer: Medicare Other | Admitting: Family Medicine

## 2020-11-19 ENCOUNTER — Other Ambulatory Visit: Payer: Self-pay

## 2020-11-19 ENCOUNTER — Other Ambulatory Visit: Payer: Self-pay | Admitting: Family Medicine

## 2020-11-19 DIAGNOSIS — R300 Dysuria: Secondary | ICD-10-CM | POA: Diagnosis not present

## 2020-11-19 DIAGNOSIS — M6281 Muscle weakness (generalized): Secondary | ICD-10-CM | POA: Diagnosis not present

## 2020-11-19 DIAGNOSIS — Z7189 Other specified counseling: Secondary | ICD-10-CM

## 2020-11-19 DIAGNOSIS — M545 Low back pain, unspecified: Secondary | ICD-10-CM | POA: Diagnosis not present

## 2020-11-19 DIAGNOSIS — R296 Repeated falls: Secondary | ICD-10-CM | POA: Diagnosis not present

## 2020-11-19 DIAGNOSIS — R2681 Unsteadiness on feet: Secondary | ICD-10-CM | POA: Diagnosis not present

## 2020-11-19 DIAGNOSIS — R262 Difficulty in walking, not elsewhere classified: Secondary | ICD-10-CM | POA: Diagnosis not present

## 2020-11-19 MED ORDER — CEPHALEXIN 500 MG PO CAPS: 500.0000 mg | ORAL_CAPSULE | Freq: Three times a day (TID) | ORAL | 0 refills | Status: DC

## 2020-11-19 MED ORDER — TRAMADOL HCL 50 MG PO TABS: ORAL_TABLET | ORAL | 0 refills | Status: DC

## 2020-11-19 NOTE — Progress Notes (Signed)
Patient ID: Theresa Norton, female   DOB: 02/21/1929, 87 y.o.   MRN: [id number]   This visit type was conducted due to national recommendations for restrictions regarding the COVID-19 pandemic in an effort to limit this patient's exposure and mitigate transmission in our community.   Virtual Visit via Video Note  I connected with Theresa Norton on 11/19/20 at  3:00 PM EDT by a video enabled telemedicine application and verified that I am speaking with the correct person using two identifiers.  Location patient: home Location provider:work or home office Persons participating in the virtual visit: patient, provider  I discussed the limitations of evaluation and management by telemedicine and the availability of in person appointments. The patient expressed understanding and agreed to proceed.   HPI:  Theresa Norton resides at assisted living residence.  This call was assisted by her son.  They relate several day history of some burning with urination.  No gross hematuria.  They tried over-the-counter Azo without much improvement.  No reported fever.  No nausea or vomiting.  No flank pain.  She did have recent fall and x-rays revealed no acute fracture.  She had some ongoing back pain.  Tramadol helped.  She has not gone adequately with Tylenol.  Son requesting refill of her tramadol which she uses infrequently.  No further falls.  They have questions regarding advanced directives.  Patient specifically would like to have DNR.  They have discussed this in some detail.  Her son also has questions regarding MOST form.  They are certain not to pursue any kind of intervention such as intubation but he has not had time to discuss with her other issues such as whether to use IV fluids or other forms of nutrition   ROS: See pertinent positives and negatives per HPI.  Past Medical History:  Diagnosis Date   Allergy    Anemia    Arthritis    "in my fingers" (07/08/2013)   CAD (coronary artery  disease)    a. 05/2013 nonobs dzs by cath.   Colon polyps    Groin hematoma    a. 05/2013 R groin hematoma post-cath - u/s 05/31/13 large 7.49 cm r inguinal hematoma extending into thigh, no psa or avf.   H/O hiatal hernia    Heart block AV second degree    a. s/p MDT Addapta dual chamber pacemaker 05/2013 by Dr Rayann Heman.   Hypertension    On home oxygen therapy    "2L maybe 12h/day" (07/08/2013)   Pacemaker    Pacemaker    PAF (paroxysmal atrial fibrillation) ([place])    a. Found post-pacer 05/2013->Xarelto added;  b. 05/2013 Echo: EF 60-65%, mild LVH, nl wall motion w/o rwma.    Past Surgical History:  Procedure Laterality Date   BREAST CYST EXCISION Right 1959; 1970's   BUNIONECTOMY Bilateral 1967   CARDIAC CATHETERIZATION     CATARACT EXTRACTION W/ INTRAOCULAR LENS  IMPLANT, BILATERAL Bilateral    DILATION AND CURETTAGE OF UTERUS     INSERT / REPLACE / REMOVE PACEMAKER  05/2013   MDT [id number] implanted by Dr Rayann Heman for heart block   INSERT / REPLACE / REMOVE PACEMAKER  06/2013   Right ventricular lead perforation with tamponade   LEAD REVISION N/A 06/13/2013   Procedure: LEAD REVISION;  Surgeon: Coralyn Mark, MD;  Location: [facility] CATH LAB;  Service: Cardiovascular;  Laterality: N/A;   LEFT HEART CATHETERIZATION WITH CORONARY ANGIOGRAM N/A 05/27/2013   Procedure: LEFT HEART CATHETERIZATION WITH CORONARY ANGIOGRAM;  Surgeon: Birdie Riddle, MD;  Location[facility] CATH LAB;  Service: Cardiovascular;  Laterality: N/A;   PERICARDIAL TAP N/A 06/11/2013   Procedure: PERICARDIAL TAP;  Surgeon: Birdie Riddle, MD;  Location: [facility] CATH LAB;  Service: Cardiovascular;  Laterality: N/A;   PERMANENT PACEMAKER INSERTION N/A 05/27/2013   Procedure: PERMANENT PACEMAKER INSERTION;  Surgeon: Coralyn Mark, MD;  Location: [place] CATH LAB;  Service: Cardiovascular;  Laterality: N/A;   TEMPORARY PACEMAKER INSERTION N/A 05/27/2013   Procedure: TEMPORARY PACEMAKER INSERTION;  Surgeon: Birdie Riddle, MD;  Location: [place] CATH LAB;   Service: Cardiovascular;  Laterality: N/A;   VARICOSE VEIN SURGERY Bilateral ~ 2002    Family History  Problem Relation Age of Onset   Cancer Father        lung    SOCIAL HX: She currently resides in a facility which is assisted living.  Never smoked.   Current Outpatient Medications:    amLODipine (NORVASC) 5 MG tablet, TAKE 1 TABLET BY MOUTH EVERY DAY (Patient taking differently: Take 5 mg by mouth daily.), Disp: 90 tablet, Rfl: 0   beta carotene w/minerals (OCUVITE) tablet, Take 1 tablet by mouth daily., Disp: , Rfl:    cephALEXin (KEFLEX) 500 MG capsule, Take 1 capsule (500 mg total) by mouth 3 (three) times daily., Disp: 21 capsule, Rfl: 0   furosemide (LASIX) 20 MG tablet, TAKE 1 TABLET BY MOUTH DAILY AS NEEDED FOR LEG SWELLING, Disp: 90 tablet, Rfl: 0   ibuprofen (ADVIL) 200 MG tablet, Take 400 mg by mouth every 6 (six) hours as needed for mild pain., Disp: , Rfl:    lidocaine (LIDODERM) 5 %, Place 1 patch onto the skin daily. Remove & Discard patch within 12 hours or as directed by MD, Disp: 30 patch, Rfl: 0   loratadine (CLARITIN) 10 MG tablet, Take 10 mg by mouth daily., Disp: , Rfl:    Multiple Vitamin (MULTIVITAMIN ADULT PO), Take 1 tablet by mouth daily., Disp: , Rfl:    Thiamine HCl (VITAMIN B-1 PO), Take 1 tablet by mouth daily., Disp: , Rfl:    TURMERIC PO, Take 1 capsule by mouth daily., Disp: , Rfl:    vitamin B-12 (CYANOCOBALAMIN) 500 MCG tablet, Take 500 mcg by mouth daily., Disp: , Rfl:    donepezil (ARICEPT) 5 MG tablet, TAKE 1 TABLET(5 MG) BY MOUTH AT BEDTIME, Disp: 30 tablet, Rfl: 1   traMADol (ULTRAM) 50 MG tablet, TAKE 1 TABLET BY MOUTH EVERY 6 HOURS AS NEEDED FOR UP TO 5 DAYS., Disp: 30 tablet, Rfl: 0  EXAM:  VITALS per patient if applicable:  GENERAL: alert, oriented, appears well and in no acute distress  HEENT: atraumatic, conjunttiva clear, no obvious abnormalities on inspection of external nose and ears  NECK: normal movements of the head and  neck  LUNGS: on inspection no signs of respiratory distress, breathing rate appears normal, no obvious gross SOB, gasping or wheezing  CV: no obvious cyanosis  MS: moves all visible extremities without noticeable abnormality  PSYCH/NEURO: pleasant and cooperative, no obvious depression or anxiety, speech and thought processing grossly intact  ASSESSMENT AND PLAN:  Discussed the following assessment and plan:   #1 dysuria for the past week.  Does not have any fever.  Nontoxic in appearance.  By description it sounds like probable UTI  -Go ahead and start Keflex 500 mg 3 times daily for 7 days -Encouraged good hydration and follow-up immediately for any fever or worsening symptoms  #2 ongoing low back pain  following recent fall.  Overall slightly improved.  Patient requesting refill tramadol. -Refill tramadol for as needed use  #3 advanced directives.  Patient and son have had extensive discussions and she has multiple times expressed her desire for DO NOT RESUSCITATE orders.  We went ahead and filled out form and he will pick this up Monday.  We also had fairly lengthy discussion regarding the MOST form and he plans to go over specifics of this with her over the weekend    I discussed the assessment and treatment plan with the patient. The patient was provided an opportunity to ask questions and all were answered. The patient agreed with the plan and demonstrated an understanding of the instructions.   The patient was advised to call back or seek an in-person evaluation if the symptoms worsen or if the condition fails to improve as anticipated.     Carolann Littler, MD

## 2020-11-20 DIAGNOSIS — R296 Repeated falls: Secondary | ICD-10-CM | POA: Diagnosis not present

## 2020-11-20 DIAGNOSIS — R2681 Unsteadiness on feet: Secondary | ICD-10-CM | POA: Diagnosis not present

## 2020-11-20 DIAGNOSIS — R262 Difficulty in walking, not elsewhere classified: Secondary | ICD-10-CM | POA: Diagnosis not present

## 2020-11-20 DIAGNOSIS — M6281 Muscle weakness (generalized): Secondary | ICD-10-CM | POA: Diagnosis not present

## 2020-11-22 ENCOUNTER — Emergency Department (HOSPITAL_COMMUNITY)
Admission: EM | Admit: 2020-11-22 | Discharge: 2020-11-22 | Disposition: A | Discharge status: Home / Self Care | Payer: Medicare Other | Attending: Emergency Medicine | Admitting: Emergency Medicine

## 2020-11-22 ENCOUNTER — Emergency Department (HOSPITAL_COMMUNITY): Payer: Medicare Other

## 2020-11-22 DIAGNOSIS — Z79899 Other long term (current) drug therapy: Secondary | ICD-10-CM | POA: Insufficient documentation

## 2020-11-22 DIAGNOSIS — Z955 Presence of coronary angioplasty implant and graft: Secondary | ICD-10-CM | POA: Insufficient documentation

## 2020-11-22 DIAGNOSIS — R2243 Localized swelling, mass and lump, lower limb, bilateral: Secondary | ICD-10-CM | POA: Diagnosis present

## 2020-11-22 DIAGNOSIS — W19XXXA Unspecified fall, initial encounter: Secondary | ICD-10-CM | POA: Diagnosis not present

## 2020-11-22 DIAGNOSIS — I2510 Atherosclerotic heart disease of native coronary artery without angina pectoris: Secondary | ICD-10-CM | POA: Diagnosis not present

## 2020-11-22 DIAGNOSIS — R296 Repeated falls: Secondary | ICD-10-CM | POA: Diagnosis not present

## 2020-11-22 DIAGNOSIS — I10 Essential (primary) hypertension: Secondary | ICD-10-CM | POA: Diagnosis not present

## 2020-11-22 DIAGNOSIS — R262 Difficulty in walking, not elsewhere classified: Secondary | ICD-10-CM | POA: Diagnosis not present

## 2020-11-22 DIAGNOSIS — R21 Rash and other nonspecific skin eruption: Secondary | ICD-10-CM | POA: Insufficient documentation

## 2020-11-22 DIAGNOSIS — R609 Edema, unspecified: Secondary | ICD-10-CM | POA: Diagnosis not present

## 2020-11-22 DIAGNOSIS — R0989 Other specified symptoms and signs involving the circulatory and respiratory systems: Secondary | ICD-10-CM | POA: Diagnosis not present

## 2020-11-22 DIAGNOSIS — R2681 Unsteadiness on feet: Secondary | ICD-10-CM | POA: Diagnosis not present

## 2020-11-22 DIAGNOSIS — M6281 Muscle weakness (generalized): Secondary | ICD-10-CM | POA: Diagnosis not present

## 2020-11-22 DIAGNOSIS — R600 Localized edema: Secondary | ICD-10-CM | POA: Insufficient documentation

## 2020-11-22 LAB — CBC WITH DIFFERENTIAL/PLATELET
Abs Immature Granulocytes: 0.02 10*3/uL (ref 0.00–0.07)
Basophils Absolute: 0 10*3/uL (ref 0.0–0.1)
Basophils Relative: 0 %
Eosinophils Absolute: 0.1 10*3/uL (ref 0.0–0.5)
Eosinophils Relative: 3 %
HCT: 43.1 % (ref 36.0–46.0)
Hemoglobin: 14.3 g/dL (ref 12.0–15.0)
Immature Granulocytes: 0 %
Lymphocytes Relative: 17 %
Lymphs Abs: 0.9 10*3/uL (ref 0.7–4.0)
MCH: 35.2 pg — ABNORMAL HIGH (ref 26.0–34.0)
MCHC: 33.2 g/dL (ref 30.0–36.0)
MCV: 106.2 fL — ABNORMAL HIGH (ref 80.0–100.0)
Monocytes Absolute: 0.8 10*3/uL (ref 0.1–1.0)
Monocytes Relative: 15 %
Neutro Abs: 3.5 10*3/uL (ref 1.7–7.7)
Neutrophils Relative %: 65 %
Platelets: 175 10*3/uL (ref 150–400)
RBC: 4.06 MIL/uL (ref 3.87–5.11)
RDW: 13.5 % (ref 11.5–15.5)
WBC: 5.4 10*3/uL (ref 4.0–10.5)
nRBC: 0 % (ref 0.0–0.2)

## 2020-11-22 LAB — COMPREHENSIVE METABOLIC PANEL
ALT: 17 U/L (ref 0–44)
AST: 34 U/L (ref 15–41)
Albumin: 3.1 g/dL — ABNORMAL LOW (ref 3.5–5.0)
Alkaline Phosphatase: 118 U/L (ref 38–126)
Anion gap: 13 (ref 5–15)
BUN: 25 mg/dL — ABNORMAL HIGH (ref 8–23)
CO2: 20 mmol/L — ABNORMAL LOW (ref 22–32)
Calcium: 8.7 mg/dL — ABNORMAL LOW (ref 8.9–10.3)
Chloride: 99 mmol/L (ref 98–111)
Creatinine, Ser: 0.84 mg/dL (ref 0.44–1.00)
GFR, Estimated: >60 mL/min (ref >60)
Glucose, Bld: 99 mg/dL (ref 70–99)
Potassium: 4.5 mmol/L (ref 3.5–5.1)
Sodium: 132 mmol/L — ABNORMAL LOW (ref 135–145)
Total Bilirubin: 0.7 mg/dL (ref 0.3–1.2)
Total Protein: 6.2 g/dL — ABNORMAL LOW (ref 6.5–8.1)

## 2020-11-22 LAB — BRAIN NATRIURETIC PEPTIDE: B Natriuretic Peptide: 117.5 pg/mL — ABNORMAL HIGH (ref 0.0–100.0)

## 2020-11-22 MED ORDER — FUROSEMIDE 10 MG/ML IJ SOLN
20.0000 mg | Freq: Once | INTRAMUSCULAR | Status: AC
  Administered 2020-11-22: 20 mg via INTRAVENOUS
  Filled 2020-11-22: qty 2

## 2020-11-22 NOTE — ED Notes (Signed)
Discharge instructions reviewed and explained to pt and her son. Pt's son advised he will be transporting her back to her home, [facility] independent living facility.

## 2020-11-22 NOTE — ED Triage Notes (Signed)
Pt arrived via [facility] from independent living facility [facility]). Per EMS, pt's PCP advised her to go to the hospital after she contacted them today because she noticed increased edema in lower extremities bilateral with weeping bilateral as well. Pt caox4 with no c/o pain or SOB. EMS also advised pt had fall approx 2 weeks ago at home.  150/90 BP  90 HR 14 RR 95% SpO2

## 2020-11-22 NOTE — ED Provider Notes (Signed)
[place] EMERGENCY DEPARTMENT Provider Note   CSN: [id number] Arrival date & time: 11/22/20  1538     History Chief Complaint  Patient presents with   Leg Swelling    Theresa Norton is a 87 y.o. female with a PMHx of CAD, second-degree AV block status post pacemaker insertion, hypertension, paroxysmal A. fib.  She is presenting to the ED for leg swelling that began this morning. Patient states that she woke up this morning and noticed that both of her legs were more swollen than normal. She denies any pain in her legs or any redness around them. Patient denies any fevers, chills, chest pain, palpitations, orthopnea, SOB, abd pain, n/v/d, dysuria, hematuria, or any other sxs at this time. She also denies any recent travel history including long car rides or plane rides. Patient does have a hx of recurrent leg swelling and is on home lasix 20mg  PRN.   The history is provided by the patient and medical records. No language interpreter was used.      Past Medical History:  Diagnosis Date   Allergy    Anemia    Arthritis    "in my fingers" (07/08/2013)   CAD (coronary artery disease)    a. 05/2013 nonobs dzs by cath.   Colon polyps    Groin hematoma    a. 05/2013 R groin hematoma post-cath - u/s 05/31/13 large 7.49 cm r inguinal hematoma extending into thigh, no psa or avf.   H/O hiatal hernia    Heart block AV second degree    a. s/p MDT Addapta dual chamber pacemaker 05/2013 by Dr  Heman.   Hypertension    On home oxygen therapy    "2L maybe 12h/day" (07/08/2013)   Pacemaker    Pacemaker    PAF (paroxysmal atrial fibrillation) ([place])    a. Found post-pacer 05/2013->Xarelto added;  b. 05/2013 Echo: EF 60-65%, mild LVH, nl wall motion w/o rwma.    Patient Active Problem List   Diagnosis Date Noted   Abnormal x-ray of skull 10/13/2020   Monoclonal paraproteinemia 10/13/2020   Cognitive impairment 12/23/2016   Balance disorder 06/25/2016   Edema 05/11/2014    Pleural effusion 07/07/2013   Anemia, iron deficiency 06/26/2013   Hyponatremia 06/26/2013   Displacement of pacemaker electrode lead 06/13/2013   Pericardial effusion with cardiac tamponade 06/11/2013   AF (atrial fibrillation) ([place]) 06/05/2013   Complete heart block (H[facility]) 05/26/2013   Obesity (BMI 30-39.9) 04/22/2013   Wenckebach second degree AV block 04/23/2012   Hypertension 04/17/2012   Elevated blood pressure 04/10/2012   History of colon polyps 04/10/2012    Past Surgical History:  Procedure Laterality Date   BREAST CYST EXCISION Right 1959; 1970's   BUNIONECTOMY Bilateral 1967   CARDIAC CATHETERIZATION     CATARACT EXTRACTION W/ INTRAOCULAR LENS  IMPLANT, BILATERAL Bilateral    DILATION AND CURETTAGE OF UTERUS     INSERT / REPLACE / REMOVE PACEMAKER  05/2013   MDT ADDRL1 implanted by Dr  Heman for heart block   INSERT / REPLACE / REMOVE PACEMAKER  06/2013   Right ventricular lead perforation with tamponade   LEAD REVISION N/A 06/13/2013   Procedure: LEAD REVISION;  Surgeon: Coralyn Mark, MD;  Location: [facility] CATH LAB;  Service: Cardiovascular;  Laterality: N/A;   LEFT HEART CATHETERIZATION WITH CORONARY ANGIOGRAM N/A 05/27/2013   Procedure: LEFT HEART CATHETERIZATION WITH CORONARY ANGIOGRAM;  Surgeon: Birdie Riddle, MD;  Location: [facility] CATH LAB;  Service: Cardiovascular;  Laterality: N/A;   PERICARDIAL TAP N/A 06/11/2013   Procedure: PERICARDIAL TAP;  Surgeon: Birdie Riddle, MD;  Location: [place] CATH LAB;  Service: Cardiovascular;  Laterality: N/A;   PERMANENT PACEMAKER INSERTION N/A 05/27/2013   Procedure: PERMANENT PACEMAKER INSERTION;  Surgeon: Coralyn Mark, MD;  Location: [place] CATH LAB;  Service: Cardiovascular;  Laterality: N/A;   TEMPORARY PACEMAKER INSERTION N/A 05/27/2013   Procedure: TEMPORARY PACEMAKER INSERTION;  Surgeon: Birdie Riddle, MD;  Location: [facility] CATH LAB;  Service: Cardiovascular;  Laterality: N/A;   VARICOSE VEIN SURGERY Bilateral ~ 2002     OB History   No  obstetric history on file.     Family History  Problem Relation Age of Onset   Cancer Father        lung    Social History   Tobacco Use   Smoking status: Never   Smokeless tobacco: Never  Vaping Use   Vaping Use: Never used  Substance Use Topics   Alcohol use: Yes    Alcohol/week: 14.0 standard drinks    Types: 7 Glasses of wine, 7 Cans of beer per week    Comment: 2019: wine in the evening    Drug use: No    Home Medications Prior to Admission medications   Medication Sig Start Date End Date Taking? Authorizing Provider  amLODipine (NORVASC) 5 MG tablet TAKE 1 TABLET BY MOUTH EVERY DAY Patient taking differently: Take 5 mg by mouth daily. 02/04/20   Burchette, Alinda Sierras, MD  beta carotene w/minerals (OCUVITE) tablet Take 1 tablet by mouth daily.    [provider]  cephALEXin (KEFLEX) 500 MG capsule Take 1 capsule (500 mg total) by mouth 3 (three) times daily. 11/19/20   Burchette, Alinda Sierras, MD  donepezil (ARICEPT) 5 MG tablet TAKE 1 TABLET(5 MG) BY MOUTH AT BEDTIME 11/19/20   Burchette, Alinda Sierras, MD  furosemide (LASIX) 20 MG tablet TAKE 1 TABLET BY MOUTH DAILY AS NEEDED FOR LEG SWELLING 04/20/20   Burchette, Alinda Sierras, MD  ibuprofen (ADVIL) 200 MG tablet Take 400 mg by mouth every 6 (six) hours as needed for mild pain.    [provider]  lidocaine (LIDODERM) 5 % Place 1 patch onto the skin daily. Remove & Discard patch within 12 hours or as directed by MD 10/11/20   Caccavale, Sophia, PA-C  loratadine (CLARITIN) 10 MG tablet Take 10 mg by mouth daily.    [provider]  Multiple Vitamin (MULTIVITAMIN ADULT PO) Take 1 tablet by mouth daily.    [provider]  Thiamine HCl (VITAMIN B-1 PO) Take 1 tablet by mouth daily.    [provider]  traMADol (ULTRAM) 50 MG tablet TAKE 1 TABLET BY MOUTH EVERY 6 HOURS AS NEEDED FOR UP TO 5 DAYS. 11/19/20   Burchette, Alinda Sierras, MD  TURMERIC PO Take 1 capsule by mouth daily.    [provider]   vitamin B-12 (CYANOCOBALAMIN) 500 MCG tablet Take 500 mcg by mouth daily.    [provider]    Allergies    Codeine, Morphine and related, and Other  Review of Systems   Review of Systems  Constitutional:  Negative for chills and fever.  HENT:  Negative for rhinorrhea and sore throat.   Respiratory:  Negative for cough, chest tightness and shortness of breath.   Cardiovascular:  Positive for leg swelling. Negative for chest pain and palpitations.  Gastrointestinal:  Negative for abdominal pain, constipation, diarrhea, nausea and vomiting.  Genitourinary:  Negative for difficulty urinating, dysuria, frequency and hematuria.  Musculoskeletal:  Negative for arthralgias and myalgias.  Skin:  Negative for color change and wound.   Physical Exam Updated Vital Signs BP 128/89 (BP Location: Right Arm)   Pulse 91   Temp 98.3 F (36.8 C) (Oral)   Resp 18   SpO2 95%   Physical Exam Constitutional:      General: She is not in acute distress.    Appearance: Normal appearance.  HENT:     Head: Normocephalic and atraumatic.  Eyes:     Pupils: Pupils are equal, round, and reactive to light.  Cardiovascular:     Rate and Rhythm: Normal rate and regular rhythm.     Pulses: Normal pulses.     Heart sounds: No murmur heard. Pulmonary:     Effort: Pulmonary effort is normal. No respiratory distress.     Breath sounds: Rales present. No wheezing.  Abdominal:     General: There is no distension.     Palpations: Abdomen is soft.     Tenderness: There is no abdominal tenderness.  Musculoskeletal:     Right lower leg: Edema present.     Left lower leg: Edema present.     Comments: 3+ bilateral pitting edema extending up to knees  Skin:    General: Skin is warm and dry.  Neurological:     Mental Status: She is alert.     Comments: Alert and oriented to self and place. Patient unaware of year.     ED Results / Procedures / Treatments   Labs (all labs ordered are listed, but  only abnormal results are displayed) Labs Reviewed  COMPREHENSIVE METABOLIC PANEL - Abnormal; Notable for the following components:      Result Value   Sodium 132 (*)    CO2 20 (*)    BUN 25 (*)    Calcium 8.7 (*)    Total Protein 6.2 (*)    Albumin 3.1 (*)    All other components within normal limits  BRAIN NATRIURETIC PEPTIDE - Abnormal; Notable for the following components:   B Natriuretic Peptide 117.5 (*)    All other components within normal limits  CBC WITH DIFFERENTIAL/PLATELET - Abnormal; Notable for the following components:   MCV 106.2 (*)    MCH 35.2 (*)    All other components within normal limits    EKG None  Radiology DG Chest 2 View  Result Date: 11/22/2020 CLINICAL DATA:  Crackles at lung bases, increasing lower extremity edema, fell 2 weeks ago EXAM: CHEST - 2 VIEW COMPARISON:  03/01/2015 FINDINGS: Frontal and lateral views of the chest demonstrates stable dual lead pacemaker. Cardiac silhouette is unchanged. Stable atherosclerosis of the aorta. No airspace disease, effusion, or pneumothorax. There is an age indeterminate wedge compression deformity of the T12 vertebral body, with greater than 75% loss of height anteriorly. This has developed since 10/11/2020. IMPRESSION: 1. Interval development of wedge compression deformity of the T12 vertebral body, with near vertebra plana anteriorly. Retropulsion is difficult to assess on this exam. Dedicated imaging of the thoracolumbar junction may be useful. 2. No acute airspace disease. Electronically Signed   By: Randa Ngo M.D.   On: 11/22/2020 17:25    Procedures Procedures None.   Medications Ordered in ED Medications  furosemide (LASIX) injection 20 mg (20 mg Intravenous Given 11/22/20 1631)    ED Course  I have reviewed the triage vital signs and the nursing notes.  Pertinent labs & imaging  results that were available during my care of the patient were reviewed by me and considered in my medical decision  making (see chart for details).    MDM Rules/Calculators/A&P                          Patient presenting with a chief complaint of leg swelling that began this morning. CBC, CMP, BNP, and CXR were initially ordered and patient was given a dose of IV lasix 20mg . CXR showing no signs of acute airspace disease. BNP 117. Patient is stable to be discharged back to her facility and should follow up with her PCP. Advised to take her lasix everyday to help with her lower extremity swelling.   Final Clinical Impression(s) / ED Diagnoses Final diagnoses:  None    Rx / DC Orders ED Discharge Orders     None        Dorethea Clan, DO 11/22/20 [place], DO 11/22/20 1824

## 2020-11-22 NOTE — Discharge Instructions (Addendum)
Please follow up with your primary care physician in the next few days regarding your leg swelling. You should take your 20mg  dose of lasix everyday to help keep the swelling under control until you can be seen. Please return to the ED if your symptoms significantly worsen or if you develop chest pain, difficulty breathing while lying flat, or develop swelling in other parts of your body.

## 2020-11-22 NOTE — ED Notes (Signed)
Patient transported to X-ray 

## 2020-11-23 ENCOUNTER — Other Ambulatory Visit: Payer: Self-pay | Admitting: Family Medicine

## 2020-11-23 DIAGNOSIS — R296 Repeated falls: Secondary | ICD-10-CM | POA: Diagnosis not present

## 2020-11-23 DIAGNOSIS — R262 Difficulty in walking, not elsewhere classified: Secondary | ICD-10-CM | POA: Diagnosis not present

## 2020-11-23 DIAGNOSIS — R2681 Unsteadiness on feet: Secondary | ICD-10-CM | POA: Diagnosis not present

## 2020-11-23 DIAGNOSIS — M6281 Muscle weakness (generalized): Secondary | ICD-10-CM | POA: Diagnosis not present

## 2020-11-24 DIAGNOSIS — M6281 Muscle weakness (generalized): Secondary | ICD-10-CM | POA: Diagnosis not present

## 2020-11-24 DIAGNOSIS — R296 Repeated falls: Secondary | ICD-10-CM | POA: Diagnosis not present

## 2020-11-24 DIAGNOSIS — R262 Difficulty in walking, not elsewhere classified: Secondary | ICD-10-CM | POA: Diagnosis not present

## 2020-11-24 DIAGNOSIS — R2681 Unsteadiness on feet: Secondary | ICD-10-CM | POA: Diagnosis not present

## 2020-11-25 DIAGNOSIS — R262 Difficulty in walking, not elsewhere classified: Secondary | ICD-10-CM | POA: Diagnosis not present

## 2020-11-25 DIAGNOSIS — R2681 Unsteadiness on feet: Secondary | ICD-10-CM | POA: Diagnosis not present

## 2020-11-25 DIAGNOSIS — M6281 Muscle weakness (generalized): Secondary | ICD-10-CM | POA: Diagnosis not present

## 2020-11-25 DIAGNOSIS — R296 Repeated falls: Secondary | ICD-10-CM | POA: Diagnosis not present

## 2020-11-26 ENCOUNTER — Inpatient Hospital Stay: Payer: Medicare Other | Admitting: Family Medicine

## 2020-11-26 DIAGNOSIS — M6281 Muscle weakness (generalized): Secondary | ICD-10-CM | POA: Diagnosis not present

## 2020-11-26 DIAGNOSIS — R2681 Unsteadiness on feet: Secondary | ICD-10-CM | POA: Diagnosis not present

## 2020-11-26 DIAGNOSIS — R296 Repeated falls: Secondary | ICD-10-CM | POA: Diagnosis not present

## 2020-11-26 DIAGNOSIS — R262 Difficulty in walking, not elsewhere classified: Secondary | ICD-10-CM | POA: Diagnosis not present

## 2020-11-29 ENCOUNTER — Other Ambulatory Visit: Payer: Self-pay | Admitting: Family Medicine

## 2020-11-29 ENCOUNTER — Encounter: Payer: Self-pay | Admitting: Family Medicine

## 2020-11-29 ENCOUNTER — Other Ambulatory Visit: Payer: Self-pay

## 2020-11-29 ENCOUNTER — Ambulatory Visit (INDEPENDENT_AMBULATORY_CARE_PROVIDER_SITE_OTHER): Payer: Medicare Other | Admitting: Family Medicine

## 2020-11-29 VITALS — BP 122/64 | HR 96 | Temp 97.7°F | Ht 64.0 in | Wt 160.9 lb

## 2020-11-29 DIAGNOSIS — R262 Difficulty in walking, not elsewhere classified: Secondary | ICD-10-CM | POA: Diagnosis not present

## 2020-11-29 DIAGNOSIS — R2681 Unsteadiness on feet: Secondary | ICD-10-CM | POA: Diagnosis not present

## 2020-11-29 DIAGNOSIS — R600 Localized edema: Secondary | ICD-10-CM

## 2020-11-29 DIAGNOSIS — S22080A Wedge compression fracture of T11-T12 vertebra, initial encounter for closed fracture: Secondary | ICD-10-CM | POA: Diagnosis not present

## 2020-11-29 DIAGNOSIS — R296 Repeated falls: Secondary | ICD-10-CM | POA: Diagnosis not present

## 2020-11-29 DIAGNOSIS — M6281 Muscle weakness (generalized): Secondary | ICD-10-CM | POA: Diagnosis not present

## 2020-11-29 MED ORDER — CALCITONIN (SALMON) 200 UNIT/ACT NA SOLN: 1.0000 | Freq: Every day | NASAL | 1 refills | Status: DC

## 2020-11-29 NOTE — Progress Notes (Signed)
Established Patient Office Visit  Subjective:  Patient ID: Theresa Norton, female    DOB: 1929-03-08  Age: 87 y.o. MRN: [id number]  CC:  Chief Complaint  Patient presents with   Follow-up    HPI Theresa Norton presents for follow-up from recent ER visit.  She has multiple chronic problems including history of atrial fibrillation, hypertension, history of second-degree AV block with chronic pacemaker, cognitive impairment.    She had recent dysuria symptoms and we called in Keflex.  She denies any dysuria at this time.   Family recently noticed some increased edema which eventually developed into weeping edema.  She is on Lasix 20 mg daily but family has concerns that she has not been taking this regularly.  She went to the ER on the 18th with increased edema.  Denies any abdominal pain, dyspnea, chest pain, palpitations, fever.  She had multiple labs.  Her renal function was stable.  Electrolytes stable.  Albumin 3.1.  BNP level 117.  MCV 106.  Chest x-ray showed interval development of wedge compression deformity T12 vertebral body.  This had developed since 10/11/2020.  Patient has had some nonspecific poorly localized pain.  She does have some pain in her lower thoracic spine area but denies any recent injury.  She has been taking some Tylenol sporadically for her pain.  We discussed DNR recently and son would like to pick up that form today.  We had completed that recently.  Past Medical History:  Diagnosis Date   Allergy    Anemia    Arthritis    "in my fingers" (07/08/2013)   CAD (coronary artery disease)    a. 05/2013 nonobs dzs by cath.   Colon polyps    Groin hematoma    a. 05/2013 R groin hematoma post-cath - u/s 05/31/13 large 7.49 cm r inguinal hematoma extending into thigh, no psa or avf.   H/O hiatal hernia    Heart block AV second degree    a. s/p MDT Addapta dual chamber pacemaker 05/2013 by Dr Rayann Heman.   Hypertension    On home oxygen therapy    "2L maybe  12h/day" (07/08/2013)   Pacemaker    Pacemaker    PAF (paroxysmal atrial fibrillation) ([place])    a. Found post-pacer 05/2013->Xarelto added;  b. 05/2013 Echo: EF 60-65%, mild LVH, nl wall motion w/o rwma.    Past Surgical History:  Procedure Laterality Date   BREAST CYST EXCISION Right 1959; 1970's   BUNIONECTOMY Bilateral 1967   CARDIAC CATHETERIZATION     CATARACT EXTRACTION W/ INTRAOCULAR LENS  IMPLANT, BILATERAL Bilateral    DILATION AND CURETTAGE OF UTERUS     INSERT / REPLACE / REMOVE PACEMAKER  05/2013   MDT [id number] implanted by Dr Rayann Heman for heart block   INSERT / REPLACE / REMOVE PACEMAKER  06/2013   Right ventricular lead perforation with tamponade   LEAD REVISION N/A 06/13/2013   Procedure: LEAD REVISION;  Surgeon: Coralyn Mark, MD;  Location: [facility] CATH LAB;  Service: Cardiovascular;  Laterality: N/A;   LEFT HEART CATHETERIZATION WITH CORONARY ANGIOGRAM N/A 05/27/2013   Procedure: LEFT HEART CATHETERIZATION WITH CORONARY ANGIOGRAM;  Surgeon: Birdie Riddle, MD;  Location: [facility] CATH LAB;  Service: Cardiovascular;  Laterality: N/A;   PERICARDIAL TAP N/A 06/11/2013   Procedure: PERICARDIAL TAP;  Surgeon: Birdie Riddle, MD;  Location: [facility] CATH LAB;  Service: Cardiovascular;  Laterality: N/A;   PERMANENT PACEMAKER INSERTION N/A 05/27/2013   Procedure: PERMANENT PACEMAKER INSERTION;  Surgeon: Coralyn Mark, MD;  Location: [facility] C[facility] LAB;  Service: Cardiovascular;  Laterality: N/A;   TEMPORARY PACEMAKER INSERTION N/A 05/27/2013   Procedure: TEMPORARY PACEMAKER INSERTION;  Surgeon: Birdie Riddle, MD;  Location: [facility] CATH LAB;  Service: Cardiovascular;  Laterality: N/A;   VARICOSE VEIN SURGERY Bilateral ~ 2002    Family History  Problem Relation Age of Onset   Cancer Father        lung    Social History   Socioeconomic History   Marital status: Widowed    Spouse name: Not on file   Number of children: Not on file   Years of education: Not on file   Highest education level: Not on file   Occupational History   Not on file  Tobacco Use   Smoking status: Never   Smokeless tobacco: Never  Vaping Use   Vaping Use: Never used  Substance and Sexual Activity   Alcohol use: Yes    Alcohol/week: 14.0 standard drinks    Types: 7 Glasses of wine, 7 Cans of beer per week    Comment: 2019: wine in the evening    Drug use: No   Sexual activity: Never  Other Topics Concern   Not on file  Social History Narrative   Living at [place] Strain: Low Risk    Difficulty of Paying Living Expenses: Not hard at all  Food Insecurity: No Food Insecurity   Worried About Charity fundraiser in the Last Year: Never true   [place] in the Last Year: Never true  Transportation Needs: No Transportation Needs   Lack of Transportation (Medical): No   Lack of Transportation (Non-Medical): No  Physical Activity: Inactive   Days of Exercise per Week: 0 days   Minutes of Exercise per Session: 0 min  Stress: No Stress Concern Present   Feeling of Stress : Not at all  Social Connections: Socially Isolated   Frequency of Communication with Friends and Family: More than three times a week   Frequency of Social Gatherings with Friends and Family: Three times a week   Attends Religious Services: Never   Active Member of Clubs or Organizations: No   Attends Archivist Meetings: Never   Marital Status: Widowed  Human resources officer Violence: Not At Risk   Fear of Current or Ex-Partner: No   Emotionally Abused: No   Physically Abused: No   Sexually Abused: No    Outpatient Medications Prior to Visit  Medication Sig Dispense Refill   amLODipine (NORVASC) 5 MG tablet TAKE 1 TABLET BY MOUTH EVERY DAY (Patient taking differently: Take 5 mg by mouth daily.) 90 tablet 0   beta carotene w/minerals (OCUVITE) tablet Take 1 tablet by mouth daily.     donepezil (ARICEPT) 5 MG tablet TAKE 1 TABLET(5 MG) BY MOUTH AT BEDTIME 30 tablet  1   furosemide (LASIX) 20 MG tablet TAKE 1 TABLET BY MOUTH DAILY AS NEEDED FOR LEG SWELLING 30 tablet 2   ibuprofen (ADVIL) 200 MG tablet Take 400 mg by mouth every 6 (six) hours as needed for mild pain.     lidocaine (LIDODERM) 5 % Place 1 patch onto the skin daily. Remove & Discard patch within 12 hours or as directed by MD 30 patch 0   loratadine (CLARITIN) 10 MG tablet Take 10 mg by mouth daily.     Multiple Vitamin (MULTIVITAMIN ADULT PO) Take 1 tablet  by mouth daily.     Thiamine HCl (VITAMIN B-1 PO) Take 1 tablet by mouth daily.     traMADol (ULTRAM) 50 MG tablet TAKE 1 TABLET BY MOUTH EVERY 6 HOURS AS NEEDED FOR UP TO 5 DAYS. 30 tablet 0   TURMERIC PO Take 1 capsule by mouth daily.     vitamin B-12 (CYANOCOBALAMIN) 500 MCG tablet Take 500 mcg by mouth daily.     cephALEXin (KEFLEX) 500 MG capsule Take 1 capsule (500 mg total) by mouth 3 (three) times daily. 21 capsule 0   No facility-administered medications prior to visit.    Allergies  Allergen Reactions   Codeine     GI upset   Morphine And Related Other (See Comments)    GI upset GI upset   Other Other (See Comments)    Pain killers, unknown,     ROS Review of Systems  Constitutional:  Negative for chills and fever.  Respiratory:  Negative for cough and shortness of breath.   Cardiovascular:  Positive for leg swelling. Negative for chest pain.  Genitourinary:  Negative for dysuria.  Musculoskeletal:  Positive for back pain.  Neurological:  Negative for dizziness.     Objective:    Physical Exam Vitals reviewed.  Cardiovascular:     Rate and Rhythm: Normal rate and regular rhythm.  Pulmonary:     Effort: Pulmonary effort is normal.     Breath sounds: Normal breath sounds.  Musculoskeletal:     Comments: She does have some mild tenderness to palpation around T12 region of the spine  Skin:    Comments: She has compression garments on both legs.  No weeping edema at this time.  Trace pitting edema bilaterally.   Neurological:     General: No focal deficit present.     Mental Status: She is alert.    BP 122/64   Pulse 96   Temp 97.7 F (36.5 C) (Oral)   Ht '5\' 4"'$  (1.626 m)   Wt 160 lb 14.4 oz (73 kg)   SpO2 94%   BMI 27.62 kg/m  Wt Readings from Last 3 Encounters:  11/29/20 160 lb 14.4 oz (73 kg)  05/10/20 172 lb 8 oz (78.2 kg)  04/20/20 175 lb 14.4 oz (79.8 kg)     Health Maintenance Due  Topic Date Due   Zoster Vaccines- Shingrix (1 of 2) Never done   DEXA SCAN  Never done   PNA vac Low Risk Adult (1 of 2 - PCV13) Never done   TETANUS/TDAP  04/11/2007   COVID-19 Vaccine (4 - Booster for Moderna series) 06/04/2020    There are no preventive care reminders to display for this patient.  Lab Results  Component Value Date   TSH 1.11 09/14/2015   Lab Results  Component Value Date   WBC 5.4 11/22/2020   HGB 14.3 11/22/2020   HCT 43.1 11/22/2020   MCV 106.2 (H) 11/22/2020   PLT 175 11/22/2020   Lab Results  Component Value Date   NA 132 (L) 11/22/2020   K 4.5 11/22/2020   CO2 20 (L) 11/22/2020   GLUCOSE 99 11/22/2020   BUN 25 (H) 11/22/2020   CREATININE 0.84 11/22/2020   BILITOT 0.7 11/22/2020   ALKPHOS 118 11/22/2020   AST 34 11/22/2020   ALT 17 11/22/2020   PROT 6.2 (L) 11/22/2020   ALBUMIN 3.1 (L) 11/22/2020   CALCIUM 8.7 (L) 11/22/2020   ANIONGAP 13 11/22/2020   GFR 62.08 03/11/2018   Lab Results  Component Value Date   CHOL 232 (H) 04/17/2012   Lab Results  Component Value Date   HDL 96.80 04/17/2012   No results found for: [facility] Lab Results  Component Value Date   TRIG 136.0 04/17/2012   Lab Results  Component Value Date   CHOLHDL 2 04/17/2012   No results found for: HGBA1C    Assessment & Plan:   #1 bilateral leg edema.  No evidence for overt edema.  Recent albumin 3.1 which is a drop from her previous baseline.  Inconsistent use of furosemide.  Last echo 2016 showed preserved ejection fraction  -Elevate legs as much as  possible -Furosemide 20 mg take 2 capsules daily -We discussed getting daily weights but son thinks this will be difficult with her -Discussed trying to get more quality protein sources in her diet  #2 low back pain.  Recent chest x-ray showed T12 compression. -We discussed trial of Miacalcin nasal 1 spray alternate nostrils once daily with goal of 4 to 6 weeks of therapy. -May have some tramadol to supplement with as needed. -We discussed briefly osteoporosis therapies but they are not interested at this time.  She has been minimally ambulatory -Forms completed for handicap placard  Meds ordered this encounter  Medications   calcitonin, salmon, (MIACALCIN) 200 UNIT/ACT nasal spray    Sig: Place 1 spray into alternate nostrils daily.    Dispense:  3.7 mL    Refill:  1    Follow-up: No follow-ups on file.    Carolann Littler, MD

## 2020-11-29 NOTE — Patient Instructions (Signed)
Start the Miacalcin nasal spray one spray in alternate nostrils once daily and use for 4 to 6 weeks  Continue with the Furosemide 20 mg two tablets once daily.

## 2020-11-30 DIAGNOSIS — M6281 Muscle weakness (generalized): Secondary | ICD-10-CM | POA: Diagnosis not present

## 2020-11-30 DIAGNOSIS — R262 Difficulty in walking, not elsewhere classified: Secondary | ICD-10-CM | POA: Diagnosis not present

## 2020-11-30 DIAGNOSIS — R296 Repeated falls: Secondary | ICD-10-CM | POA: Diagnosis not present

## 2020-11-30 DIAGNOSIS — R2681 Unsteadiness on feet: Secondary | ICD-10-CM | POA: Diagnosis not present

## 2020-12-01 DIAGNOSIS — R262 Difficulty in walking, not elsewhere classified: Secondary | ICD-10-CM | POA: Diagnosis not present

## 2020-12-01 DIAGNOSIS — R2681 Unsteadiness on feet: Secondary | ICD-10-CM | POA: Diagnosis not present

## 2020-12-01 DIAGNOSIS — M6281 Muscle weakness (generalized): Secondary | ICD-10-CM | POA: Diagnosis not present

## 2020-12-01 DIAGNOSIS — R296 Repeated falls: Secondary | ICD-10-CM | POA: Diagnosis not present

## 2020-12-02 ENCOUNTER — Encounter: Payer: Self-pay | Admitting: Family Medicine

## 2020-12-02 DIAGNOSIS — R296 Repeated falls: Secondary | ICD-10-CM | POA: Diagnosis not present

## 2020-12-02 DIAGNOSIS — M6281 Muscle weakness (generalized): Secondary | ICD-10-CM | POA: Diagnosis not present

## 2020-12-02 DIAGNOSIS — R262 Difficulty in walking, not elsewhere classified: Secondary | ICD-10-CM | POA: Diagnosis not present

## 2020-12-02 DIAGNOSIS — R2681 Unsteadiness on feet: Secondary | ICD-10-CM | POA: Diagnosis not present

## 2020-12-02 NOTE — Progress Notes (Signed)
Remote pacemaker transmission.   

## 2020-12-03 DIAGNOSIS — R2681 Unsteadiness on feet: Secondary | ICD-10-CM | POA: Diagnosis not present

## 2020-12-03 DIAGNOSIS — R296 Repeated falls: Secondary | ICD-10-CM | POA: Diagnosis not present

## 2020-12-03 DIAGNOSIS — R262 Difficulty in walking, not elsewhere classified: Secondary | ICD-10-CM | POA: Diagnosis not present

## 2020-12-03 DIAGNOSIS — M6281 Muscle weakness (generalized): Secondary | ICD-10-CM | POA: Diagnosis not present

## 2020-12-03 NOTE — Telephone Encounter (Addendum)
Forms have been placed in red folder for review.

## 2020-12-03 NOTE — Telephone Encounter (Signed)
[facility] faxed FL2 forms to have completed  Theresa Norton  [id number]  Disposition: Dr's folder

## 2020-12-04 DIAGNOSIS — Z111 Encounter for screening for respiratory tuberculosis: Secondary | ICD-10-CM | POA: Diagnosis not present

## 2020-12-06 DIAGNOSIS — M6281 Muscle weakness (generalized): Secondary | ICD-10-CM | POA: Diagnosis not present

## 2020-12-08 ENCOUNTER — Telehealth: Payer: Self-pay

## 2020-12-08 NOTE — Telephone Encounter (Signed)
Urgent request Marla from [facility] called requesting [place] for Dinia Zimmerle April 29, 1929 documents faxed on 7/27. Pt is already at the facility. Call back [phone]

## 2020-12-15 ENCOUNTER — Telehealth: Payer: Self-pay

## 2020-12-15 ENCOUNTER — Telehealth: Payer: Self-pay | Admitting: Family Medicine

## 2020-12-15 NOTE — Telephone Encounter (Signed)
Please advise 

## 2020-12-15 NOTE — Telephone Encounter (Signed)
Confirmed this is the correct Rx. Med list has been updated.

## 2020-12-15 NOTE — Telephone Encounter (Signed)
Please advise. This medication is listed under a historical provider.

## 2020-12-15 NOTE — Addendum Note (Signed)
Addended by: Rebecca Eaton on: 12/15/2020 12:51 PM   Modules accepted: Orders

## 2020-12-15 NOTE — Telephone Encounter (Signed)
Med tech called from [facility] stating clarification is needed for Thiamine [facility] wants to know how many MG should be given you can speak with any Med tech [phone]

## 2020-12-15 NOTE — Telephone Encounter (Signed)
[facility] called to request a DC for 2 medications as requested by the family for:  donepezil (ARICEPT) 5 MG tablet  And a Patch which I did not see on the list  They provided the fax # [phone] for the orders to be fax to.

## 2020-12-20 DIAGNOSIS — D519 Vitamin B12 deficiency anemia, unspecified: Secondary | ICD-10-CM | POA: Diagnosis not present

## 2020-12-20 DIAGNOSIS — I10 Essential (primary) hypertension: Secondary | ICD-10-CM | POA: Diagnosis not present

## 2020-12-20 DIAGNOSIS — J302 Other seasonal allergic rhinitis: Secondary | ICD-10-CM | POA: Diagnosis not present

## 2020-12-20 DIAGNOSIS — F0390 Unspecified dementia without behavioral disturbance: Secondary | ICD-10-CM | POA: Diagnosis not present

## 2020-12-22 DIAGNOSIS — F0280 Dementia in other diseases classified elsewhere without behavioral disturbance: Secondary | ICD-10-CM | POA: Diagnosis not present

## 2020-12-22 DIAGNOSIS — I10 Essential (primary) hypertension: Secondary | ICD-10-CM | POA: Diagnosis not present

## 2020-12-22 DIAGNOSIS — E669 Obesity, unspecified: Secondary | ICD-10-CM | POA: Diagnosis not present

## 2020-12-22 DIAGNOSIS — Z9181 History of falling: Secondary | ICD-10-CM | POA: Diagnosis not present

## 2020-12-22 DIAGNOSIS — E538 Deficiency of other specified B group vitamins: Secondary | ICD-10-CM | POA: Diagnosis not present

## 2020-12-22 DIAGNOSIS — M150 Primary generalized (osteo)arthritis: Secondary | ICD-10-CM | POA: Diagnosis not present

## 2020-12-22 DIAGNOSIS — Z6832 Body mass index (BMI) 32.0-32.9, adult: Secondary | ICD-10-CM | POA: Diagnosis not present

## 2020-12-23 ENCOUNTER — Encounter: Payer: Self-pay | Admitting: Family Medicine

## 2020-12-23 DIAGNOSIS — B079 Viral wart, unspecified: Secondary | ICD-10-CM

## 2020-12-27 DIAGNOSIS — E538 Deficiency of other specified B group vitamins: Secondary | ICD-10-CM | POA: Diagnosis not present

## 2020-12-27 DIAGNOSIS — Z6832 Body mass index (BMI) 32.0-32.9, adult: Secondary | ICD-10-CM | POA: Diagnosis not present

## 2020-12-27 DIAGNOSIS — E669 Obesity, unspecified: Secondary | ICD-10-CM | POA: Diagnosis not present

## 2020-12-27 DIAGNOSIS — F0280 Dementia in other diseases classified elsewhere without behavioral disturbance: Secondary | ICD-10-CM | POA: Diagnosis not present

## 2020-12-27 DIAGNOSIS — M150 Primary generalized (osteo)arthritis: Secondary | ICD-10-CM | POA: Diagnosis not present

## 2020-12-27 DIAGNOSIS — I10 Essential (primary) hypertension: Secondary | ICD-10-CM | POA: Diagnosis not present

## 2020-12-29 DIAGNOSIS — Z6832 Body mass index (BMI) 32.0-32.9, adult: Secondary | ICD-10-CM | POA: Diagnosis not present

## 2020-12-29 DIAGNOSIS — M150 Primary generalized (osteo)arthritis: Secondary | ICD-10-CM | POA: Diagnosis not present

## 2020-12-29 DIAGNOSIS — F0280 Dementia in other diseases classified elsewhere without behavioral disturbance: Secondary | ICD-10-CM | POA: Diagnosis not present

## 2020-12-29 DIAGNOSIS — E538 Deficiency of other specified B group vitamins: Secondary | ICD-10-CM | POA: Diagnosis not present

## 2020-12-29 DIAGNOSIS — I10 Essential (primary) hypertension: Secondary | ICD-10-CM | POA: Diagnosis not present

## 2020-12-29 DIAGNOSIS — E669 Obesity, unspecified: Secondary | ICD-10-CM | POA: Diagnosis not present

## 2020-12-31 ENCOUNTER — Other Ambulatory Visit: Payer: Self-pay

## 2020-12-31 ENCOUNTER — Ambulatory Visit (INDEPENDENT_AMBULATORY_CARE_PROVIDER_SITE_OTHER): Payer: Medicare Other | Admitting: Podiatrist

## 2020-12-31 ENCOUNTER — Encounter: Payer: Self-pay | Admitting: Podiatrist

## 2020-12-31 DIAGNOSIS — R1013 Epigastric pain: Secondary | ICD-10-CM | POA: Insufficient documentation

## 2020-12-31 DIAGNOSIS — K219 Gastro-esophageal reflux disease without esophagitis: Secondary | ICD-10-CM | POA: Insufficient documentation

## 2020-12-31 DIAGNOSIS — R143 Flatulence: Secondary | ICD-10-CM | POA: Insufficient documentation

## 2020-12-31 DIAGNOSIS — M6240 Contracture of muscle, unspecified site: Secondary | ICD-10-CM

## 2020-12-31 DIAGNOSIS — L84 Corns and callosities: Secondary | ICD-10-CM | POA: Diagnosis not present

## 2020-12-31 DIAGNOSIS — M2042 Other hammer toe(s) (acquired), left foot: Secondary | ICD-10-CM | POA: Diagnosis not present

## 2020-12-31 DIAGNOSIS — K5900 Constipation, unspecified: Secondary | ICD-10-CM | POA: Insufficient documentation

## 2020-12-31 DIAGNOSIS — F1020 Alcohol dependence, uncomplicated: Secondary | ICD-10-CM | POA: Insufficient documentation

## 2020-12-31 DIAGNOSIS — Z1211 Encounter for screening for malignant neoplasm of colon: Secondary | ICD-10-CM | POA: Insufficient documentation

## 2020-12-31 NOTE — Patient Instructions (Signed)
Corns and Calluses Corns are small areas of thickened skin that form on the top, sides, or tip of a toe. Corns have a cone-shaped core with a point that can press on a nerve below. This causes pain. Calluses are areas of thickened skin that can form anywhere on the body, including the hands, fingers, palms, soles of the feet, and heels. Calluses are usually larger than corns. What are the causes? Corns and calluses are caused by rubbing (friction) or pressure, such as from shoes that are too tight or do not fit properly. What increases the risk? Corns are more likely to develop in people who have misshapen toes (toe deformities), such as hammer toes. Calluses can form with friction to any area of the skin. They are more likely to develop in people who: Work with their hands. Wear shoes that fit poorly, are too tight, or are high-heeled. Have toe deformities. What are the signs or symptoms? Symptoms of a corn or callus include: A hard growth on the skin. Pain or tenderness under the skin. Redness and swelling. Increased discomfort while wearing tight-fitting shoes, if your feet are affected. If a corn or callus becomes infected, symptoms may include: Redness and swelling that gets worse. Pain. Fluid, blood, or pus draining from the corn or callus. How is this diagnosed? Corns and calluses may be diagnosed based on your symptoms, your medical history, and a physical exam. How is this treated? Treatment for corns and calluses may include: Removing the cause of the friction or pressure. This may involve: Changing your shoes. Wearing shoe inserts (orthotics) or other protective layers in your shoes, such as a corn pad. Wearing gloves. Applying medicine to the skin (topical medicine) to help soften skin in the hardened, thickened areas. Removing layers of dead skin with a file to reduce the size of the corn or callus. Removing the corn or callus with a scalpel or laser. Taking antibiotic  medicines, if your corn or callus is infected. Having surgery, if a toe deformity is the cause. Follow these instructions at home:  Take over-the-counter and prescription medicines only as told by your health care provider. If you were prescribed an antibiotic medicine, take it as told by your health care provider. Do not stop taking it even if your condition improves. Wear shoes that fit well. Avoid wearing high-heeled shoes and shoes that are too tight or too loose. Wear any padding, protective layers, gloves, or orthotics as told by your health care provider. Soak your hands or feet. Then use a file or pumice stone to soften your corn or callus. Do this as told by your health care provider. Check your corn or callus every day for signs of infection. Contact a health care provider if: Your symptoms do not improve with treatment. You have redness or swelling that gets worse. Your corn or callus becomes painful. You have fluid, blood, or pus coming from your corn or callus. You have new symptoms. Get help right away if: You develop severe pain with redness. Summary Corns are small areas of thickened skin that form on the top, sides, or tip of a toe. These can be painful. Calluses are areas of thickened skin that can form anywhere on the body, including the hands, fingers, palms, and soles of the feet. Calluses are usually larger than corns. Corns and calluses are caused by rubbing (friction) or pressure, such as from shoes that are too tight or do not fit properly. Treatment may include wearing padding, protective   layers, gloves, or orthotics as told by your health care provider. This information is not intended to replace advice given to you by your health care provider. Make sure you discuss any questions you have with your health care provider. Document Revised: 08/21/2019 Document Reviewed: 08/21/2019 Elsevier Patient Education  2022 Elsevier Inc.  

## 2020-12-31 NOTE — Progress Notes (Signed)
Chief Complaint  Patient presents with   lesion    Lesion at Lt 3rd toe x long time - recently started bohterin again x 2 wks - w/ soreness ; 5/10 Tx: bandaid -w/ BL feet swelling     HPI: Patient is 87 y.o. female who presents today with her son for pain on the left third toe at the tip.  Its been hurting her for a long time but 2 weeks ago it started to hurt significantly.  Patient also relates swelling of both feet and ankles.  Patient Active Problem List   Diagnosis Date Noted   Acid reflux 12/31/2020   Alcohol dependence ([place]) 12/31/2020   Colon cancer screening 12/31/2020   Constipation 12/31/2020   Epigastric pain 12/31/2020   Flatulence, eructation and gas pain 12/31/2020   Abnormal x-ray of skull 10/13/2020   Monoclonal paraproteinemia 10/13/2020   Cognitive impairment 12/23/2016   Balance disorder 06/25/2016   Closed fracture of right distal radius 02/17/2016   Alcohol use 04/07/2015   Dyspnea 04/07/2015   Edema 05/11/2014   Pleural effusion 07/07/2013   Anemia, iron deficiency 06/26/2013   Hyponatremia 06/26/2013   Hypo-osmolality and hyponatremia 06/26/2013   Displacement of pacemaker electrode lead 06/13/2013   Displacement of cardiac electrode 06/13/2013   Pericardial effusion with cardiac tamponade 06/11/2013   AF (atrial fibrillation) ([place]) 06/05/2013   Complete heart block ([place]) 05/26/2013   Obesity (BMI 30-39.9) 04/22/2013   Other secondary cataract, unspecified eye 12/11/2012   Presence of intraocular lens 11/06/2012   Pseudophakia 11/06/2012   Wenckebach second degree AV block 04/23/2012   Hypertension 04/17/2012   Elevated blood pressure 04/10/2012   History of colon polyps 04/10/2012   Elevated blood-pressure reading without diagnosis of hypertension 04/10/2012   Exudative age-related macular degeneration, unspecified eye, stage unspecified ([place]) 04/20/2011    Current Outpatient Medications on File Prior to Visit  Medication Sig Dispense Refill    amLODipine (NORVASC) 5 MG tablet TAKE 1 TABLET BY MOUTH EVERY DAY (Patient taking differently: Take 5 mg by mouth daily.) 90 tablet 0   beta carotene w/minerals (OCUVITE) tablet Take 1 tablet by mouth daily.     calcitonin, salmon, (MIACALCIN) 200 UNIT/ACT nasal spray Place 1 spray into alternate nostrils daily. 3.7 mL 1   donepezil (ARICEPT) 5 MG tablet Take 5 mg by mouth daily.     furosemide (LASIX) 20 MG tablet TAKE 1 TABLET BY MOUTH DAILY AS NEEDED FOR LEG SWELLING 30 tablet 2   ibuprofen (ADVIL) 200 MG tablet Take 400 mg by mouth every 6 (six) hours as needed for mild pain.     lidocaine (LIDODERM) 5 % 1 patch daily.     loratadine (CLARITIN) 10 MG tablet Take 10 mg by mouth daily.     Multiple Vitamin (MULTIVITAMIN ADULT PO) Take 1 tablet by mouth daily.     Thiamine HCl (VITAMIN B-1 PO) Take 1 tablet by mouth daily.     traMADol (ULTRAM) 50 MG tablet TAKE 1 TABLET BY MOUTH EVERY 6 HOURS AS NEEDED FOR UP TO 5 DAYS. 30 tablet 0   TURMERIC PO Take 1 capsule by mouth daily.     vitamin B-12 (CYANOCOBALAMIN) 500 MCG tablet Take 500 mcg by mouth daily.     No current facility-administered medications on file prior to visit.    Allergies  Allergen Reactions   Codeine     GI upset   Morphine And Related Other (See Comments)    GI upset GI upset  Other Other (See Comments)    Pain killers, unknown,     Review of Systems No fevers, chills, nausea, muscle aches, no difficulty breathing, no calf pain, no chest pain or shortness of breath.   Physical Exam  GENERAL APPEARANCE: Alert, conversant. Appropriately groomed. No acute distress.   VASCULAR: Pedal pulses palpable DP and PT bilateral.  Capillary refill time is immediate to all digits,  Proximal to distal cooling it warm to warm.  Digital perfusion adequate.  Generalized swelling of the ankles and lower legs is noted bilateral.  NEUROLOGIC: sensation is intact to 5.07 monofilament at 5/5 sites bilateral.  Light touch is intact  bilateral, vibratory sensation intact bilateral  MUSCULOSKELETAL: acceptable muscle strength, tone and stability bilateral. Contracture of left third toe is noted in comparison to second and fourth on the left foot.  Toe appears to purchase the ground and cause friction on the tip of the toe.   DERMATOLOGIC: skin is warm, supple, and dry.  Callus with a small intractable core present distal tip of the left third toe. Skin tension lines are present throughout, no capillary budding is noted to suggest wart    Assessment   1. Callus of foot   2. Contracture of tendon sheath   3. Hammertoe of left foot      Plan  Discussed exam findings with the patient and her son.  Recommended paring down the lesion which was accomplished today after anesthetizing the toe with 1% lidocaine plain in a digital block fashion.  The core was excised sharply using a #15 blade and a Band-Aid was applied a buttress pad was also fashioned and she was instructed to wear this to keep the third toe elevated.  Discussed that if the lesion comes back she could potentially benefit from a tenotomy.  The patient will call if the lesion returns otherwise we will see her back as needed

## 2021-01-02 ENCOUNTER — Encounter: Payer: Self-pay | Admitting: Family Medicine

## 2021-01-04 DIAGNOSIS — E669 Obesity, unspecified: Secondary | ICD-10-CM | POA: Diagnosis not present

## 2021-01-04 DIAGNOSIS — Z6832 Body mass index (BMI) 32.0-32.9, adult: Secondary | ICD-10-CM | POA: Diagnosis not present

## 2021-01-04 DIAGNOSIS — F0280 Dementia in other diseases classified elsewhere without behavioral disturbance: Secondary | ICD-10-CM | POA: Diagnosis not present

## 2021-01-04 DIAGNOSIS — I10 Essential (primary) hypertension: Secondary | ICD-10-CM | POA: Diagnosis not present

## 2021-01-04 DIAGNOSIS — E538 Deficiency of other specified B group vitamins: Secondary | ICD-10-CM | POA: Diagnosis not present

## 2021-01-04 DIAGNOSIS — M150 Primary generalized (osteo)arthritis: Secondary | ICD-10-CM | POA: Diagnosis not present

## 2021-01-06 DIAGNOSIS — E559 Vitamin D deficiency, unspecified: Secondary | ICD-10-CM | POA: Diagnosis not present

## 2021-01-06 DIAGNOSIS — I10 Essential (primary) hypertension: Secondary | ICD-10-CM | POA: Diagnosis not present

## 2021-01-06 DIAGNOSIS — M150 Primary generalized (osteo)arthritis: Secondary | ICD-10-CM | POA: Diagnosis not present

## 2021-01-06 DIAGNOSIS — F0280 Dementia in other diseases classified elsewhere without behavioral disturbance: Secondary | ICD-10-CM | POA: Diagnosis not present

## 2021-01-06 DIAGNOSIS — D518 Other vitamin B12 deficiency anemias: Secondary | ICD-10-CM | POA: Diagnosis not present

## 2021-01-06 DIAGNOSIS — Z6832 Body mass index (BMI) 32.0-32.9, adult: Secondary | ICD-10-CM | POA: Diagnosis not present

## 2021-01-06 DIAGNOSIS — E669 Obesity, unspecified: Secondary | ICD-10-CM | POA: Diagnosis not present

## 2021-01-06 DIAGNOSIS — E782 Mixed hyperlipidemia: Secondary | ICD-10-CM | POA: Diagnosis not present

## 2021-01-06 DIAGNOSIS — E039 Hypothyroidism, unspecified: Secondary | ICD-10-CM | POA: Diagnosis not present

## 2021-01-06 DIAGNOSIS — E538 Deficiency of other specified B group vitamins: Secondary | ICD-10-CM | POA: Diagnosis not present

## 2021-01-06 DIAGNOSIS — E119 Type 2 diabetes mellitus without complications: Secondary | ICD-10-CM | POA: Diagnosis not present

## 2021-01-07 ENCOUNTER — Encounter: Payer: Self-pay | Admitting: Family Medicine

## 2021-01-11 DIAGNOSIS — I10 Essential (primary) hypertension: Secondary | ICD-10-CM | POA: Diagnosis not present

## 2021-01-11 DIAGNOSIS — E538 Deficiency of other specified B group vitamins: Secondary | ICD-10-CM | POA: Diagnosis not present

## 2021-01-11 DIAGNOSIS — F0280 Dementia in other diseases classified elsewhere without behavioral disturbance: Secondary | ICD-10-CM | POA: Diagnosis not present

## 2021-01-11 DIAGNOSIS — M150 Primary generalized (osteo)arthritis: Secondary | ICD-10-CM | POA: Diagnosis not present

## 2021-01-11 DIAGNOSIS — Z6832 Body mass index (BMI) 32.0-32.9, adult: Secondary | ICD-10-CM | POA: Diagnosis not present

## 2021-01-11 DIAGNOSIS — E669 Obesity, unspecified: Secondary | ICD-10-CM | POA: Diagnosis not present

## 2021-01-15 DIAGNOSIS — I10 Essential (primary) hypertension: Secondary | ICD-10-CM | POA: Diagnosis not present

## 2021-01-15 DIAGNOSIS — M150 Primary generalized (osteo)arthritis: Secondary | ICD-10-CM | POA: Diagnosis not present

## 2021-01-15 DIAGNOSIS — F0280 Dementia in other diseases classified elsewhere without behavioral disturbance: Secondary | ICD-10-CM | POA: Diagnosis not present

## 2021-01-15 DIAGNOSIS — Z6832 Body mass index (BMI) 32.0-32.9, adult: Secondary | ICD-10-CM | POA: Diagnosis not present

## 2021-01-15 DIAGNOSIS — E538 Deficiency of other specified B group vitamins: Secondary | ICD-10-CM | POA: Diagnosis not present

## 2021-01-15 DIAGNOSIS — E669 Obesity, unspecified: Secondary | ICD-10-CM | POA: Diagnosis not present

## 2021-01-17 DIAGNOSIS — I10 Essential (primary) hypertension: Secondary | ICD-10-CM | POA: Diagnosis not present

## 2021-01-17 DIAGNOSIS — Z6832 Body mass index (BMI) 32.0-32.9, adult: Secondary | ICD-10-CM | POA: Diagnosis not present

## 2021-01-17 DIAGNOSIS — F0280 Dementia in other diseases classified elsewhere without behavioral disturbance: Secondary | ICD-10-CM | POA: Diagnosis not present

## 2021-01-17 DIAGNOSIS — E538 Deficiency of other specified B group vitamins: Secondary | ICD-10-CM | POA: Diagnosis not present

## 2021-01-17 DIAGNOSIS — E669 Obesity, unspecified: Secondary | ICD-10-CM | POA: Diagnosis not present

## 2021-01-17 DIAGNOSIS — M150 Primary generalized (osteo)arthritis: Secondary | ICD-10-CM | POA: Diagnosis not present

## 2021-01-18 DIAGNOSIS — F0390 Unspecified dementia without behavioral disturbance: Secondary | ICD-10-CM | POA: Diagnosis not present

## 2021-01-18 DIAGNOSIS — E785 Hyperlipidemia, unspecified: Secondary | ICD-10-CM | POA: Diagnosis not present

## 2021-01-18 DIAGNOSIS — E559 Vitamin D deficiency, unspecified: Secondary | ICD-10-CM | POA: Diagnosis not present

## 2021-01-18 DIAGNOSIS — I10 Essential (primary) hypertension: Secondary | ICD-10-CM | POA: Diagnosis not present

## 2021-01-18 DIAGNOSIS — D519 Vitamin B12 deficiency anemia, unspecified: Secondary | ICD-10-CM | POA: Diagnosis not present

## 2021-01-21 DIAGNOSIS — M150 Primary generalized (osteo)arthritis: Secondary | ICD-10-CM | POA: Diagnosis not present

## 2021-01-21 DIAGNOSIS — E669 Obesity, unspecified: Secondary | ICD-10-CM | POA: Diagnosis not present

## 2021-01-21 DIAGNOSIS — F0280 Dementia in other diseases classified elsewhere without behavioral disturbance: Secondary | ICD-10-CM | POA: Diagnosis not present

## 2021-01-21 DIAGNOSIS — I10 Essential (primary) hypertension: Secondary | ICD-10-CM | POA: Diagnosis not present

## 2021-01-21 DIAGNOSIS — Z9181 History of falling: Secondary | ICD-10-CM | POA: Diagnosis not present

## 2021-01-21 DIAGNOSIS — Z6832 Body mass index (BMI) 32.0-32.9, adult: Secondary | ICD-10-CM | POA: Diagnosis not present

## 2021-01-21 DIAGNOSIS — E538 Deficiency of other specified B group vitamins: Secondary | ICD-10-CM | POA: Diagnosis not present

## 2021-01-24 DIAGNOSIS — F0280 Dementia in other diseases classified elsewhere without behavioral disturbance: Secondary | ICD-10-CM | POA: Diagnosis not present

## 2021-01-24 DIAGNOSIS — I10 Essential (primary) hypertension: Secondary | ICD-10-CM | POA: Diagnosis not present

## 2021-01-24 DIAGNOSIS — M150 Primary generalized (osteo)arthritis: Secondary | ICD-10-CM | POA: Diagnosis not present

## 2021-01-24 DIAGNOSIS — E538 Deficiency of other specified B group vitamins: Secondary | ICD-10-CM | POA: Diagnosis not present

## 2021-01-24 DIAGNOSIS — Z6832 Body mass index (BMI) 32.0-32.9, adult: Secondary | ICD-10-CM | POA: Diagnosis not present

## 2021-01-24 DIAGNOSIS — E669 Obesity, unspecified: Secondary | ICD-10-CM | POA: Diagnosis not present

## 2021-01-26 DIAGNOSIS — I10 Essential (primary) hypertension: Secondary | ICD-10-CM | POA: Diagnosis not present

## 2021-01-26 DIAGNOSIS — Z6832 Body mass index (BMI) 32.0-32.9, adult: Secondary | ICD-10-CM | POA: Diagnosis not present

## 2021-01-26 DIAGNOSIS — M150 Primary generalized (osteo)arthritis: Secondary | ICD-10-CM | POA: Diagnosis not present

## 2021-01-26 DIAGNOSIS — E669 Obesity, unspecified: Secondary | ICD-10-CM | POA: Diagnosis not present

## 2021-01-26 DIAGNOSIS — E538 Deficiency of other specified B group vitamins: Secondary | ICD-10-CM | POA: Diagnosis not present

## 2021-01-26 DIAGNOSIS — F0280 Dementia in other diseases classified elsewhere without behavioral disturbance: Secondary | ICD-10-CM | POA: Diagnosis not present

## 2021-01-31 IMAGING — CT NM PET TUM IMG INITIAL (PI) WHOLE BODY
7 series · 25 of 25 positions shown · non-contrast
Comparison: Chest CT 04/07/2020

CLINICAL DATA: Initial treatment strategy for multiple myeloma.

EXAM:
NUCLEAR MEDICINE PET WHOLE BODY
TECHNIQUE: 8.6 mCi F-18 FDG was injected intravenously. Full-ring PET imaging
was performed from the head to foot after the radiotracer. CT data
was obtained and used for attenuation correction and anatomic
localization.
Fasting blood glucose: 100 mg/dl

[Series 3: pet wb ac · axial · 5.0mm · 4.07mm/px · z∈[-282,+1418]mm · 5 of 426 slices shown]
[im 1/426]
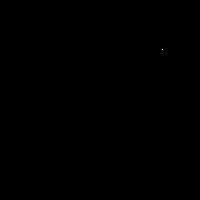
[im 107/426]
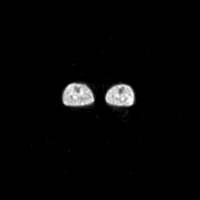
[im 213/426]
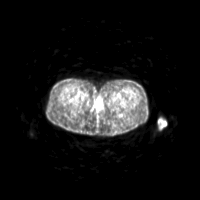
[im 319/426]
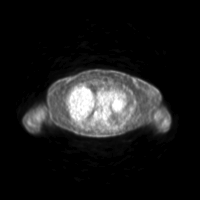
[im 426/426]
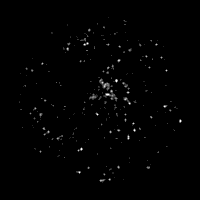

[Series 4: ct wb 5.0 hd_fov · axial · 5.0mm · 1.17mm/px · z∈[-286,+1418]mm · 5 of 425 slices shown]
[im 1/425]
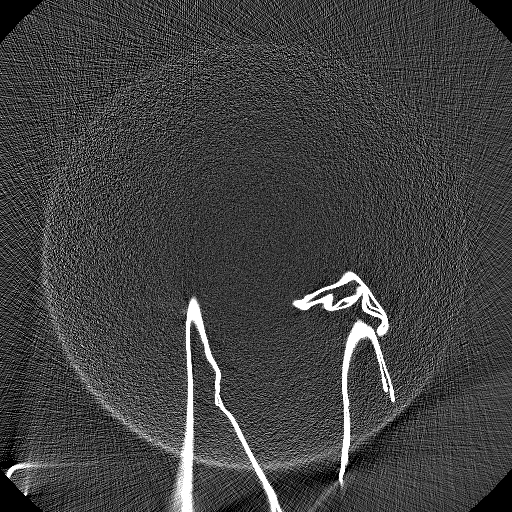
[im 107/425  soft-tissue]
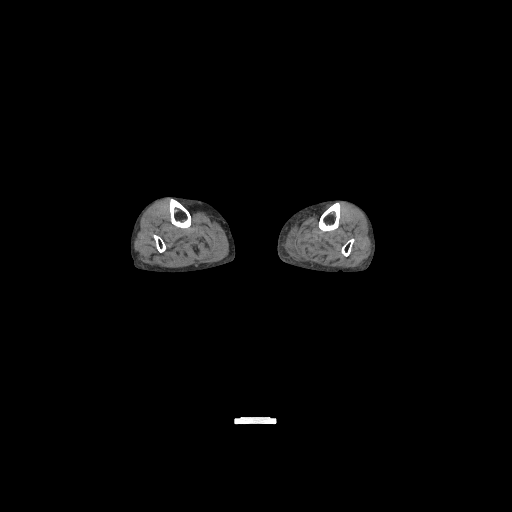
[im 213/425  soft-tissue]
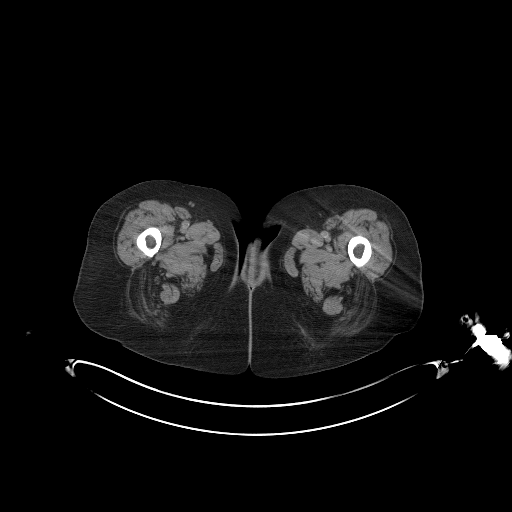
[im 319/425  soft-tissue]
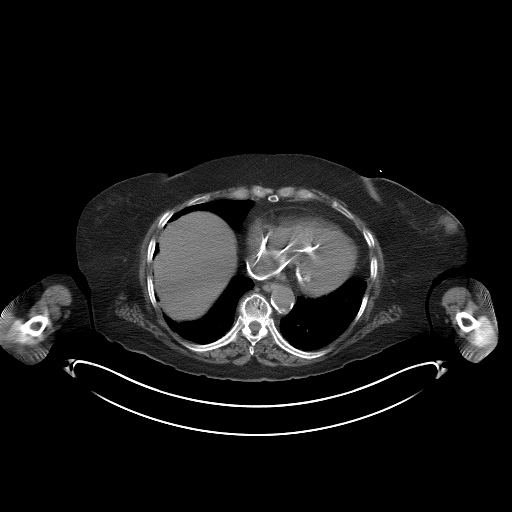
[im 425/425  soft-tissue]
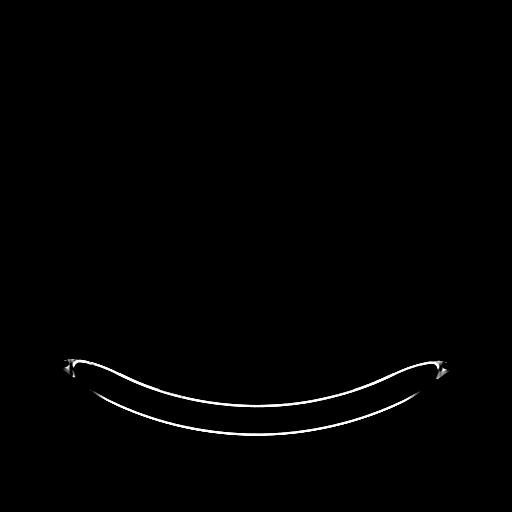

[Series 5: pet wb nac · axial · 5.0mm · 4.07mm/px · z∈[-278,+1418]mm · 6 of 425 slices shown]
[im 1/425  full-range]
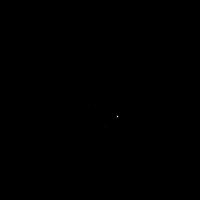
[im 85/425]
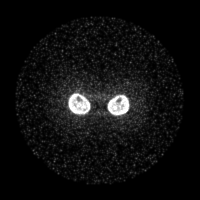
[im 170/425]
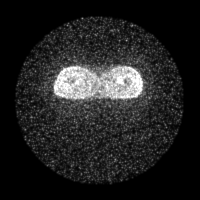
[im 255/425]
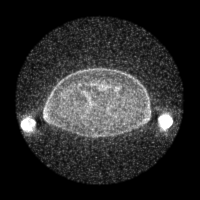
[im 340/425]
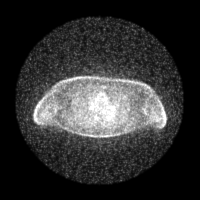
[im 425/425]
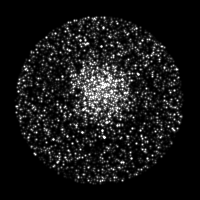

[Series 8: ct wb 5.0 br59 lung_bone · axial · 5.0mm · 0.62mm/px · 1 of 56 slices shown]
[im 1/56  lung]
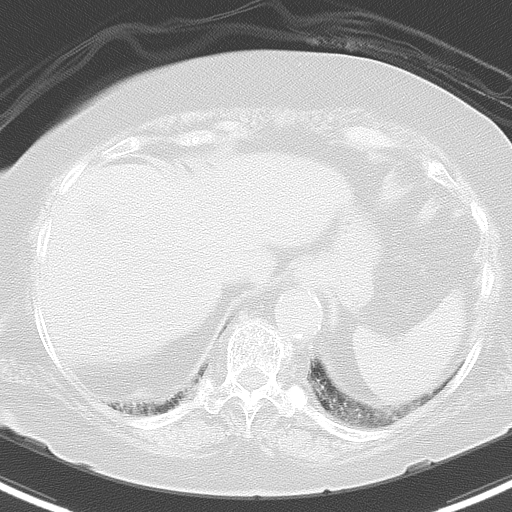

[Series 603: <mip collection> · coronal · 3.53mm/px · 1 of 32 slices shown]
[im 1/32]
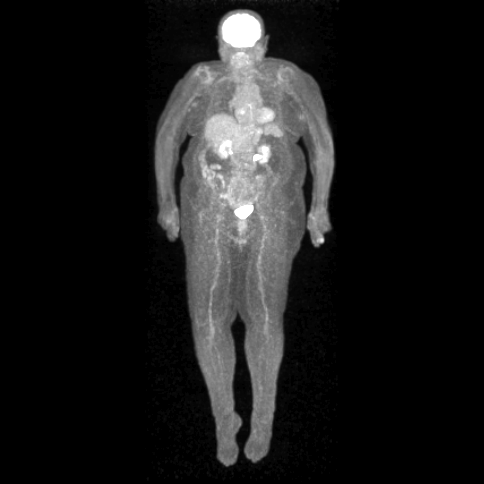

[Series 604: fused cor · 1 of 38 slices shown]
[im 1/38]
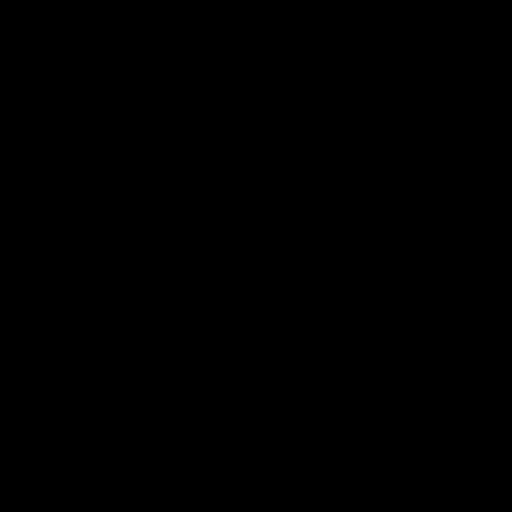

[Series 605: range-ct wb 5.0 hd_fov-tra-<alpha range> · 6 of 408 slices shown]
[im 1/408]
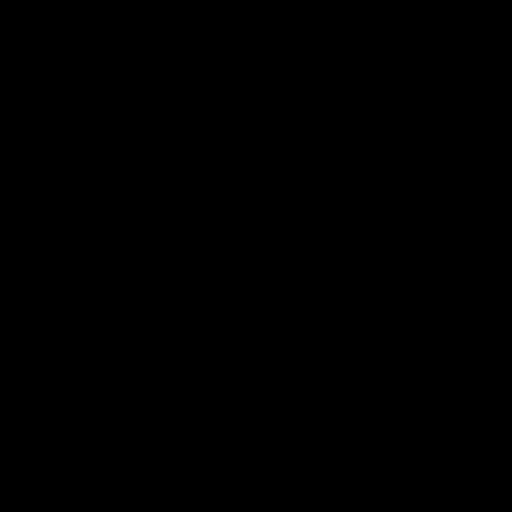
[im 82/408]
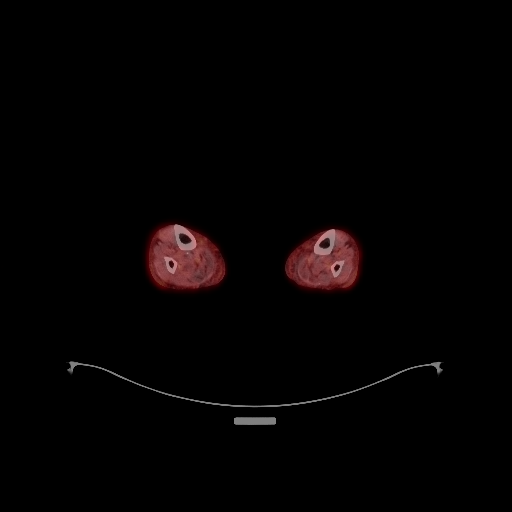
[im 163/408]
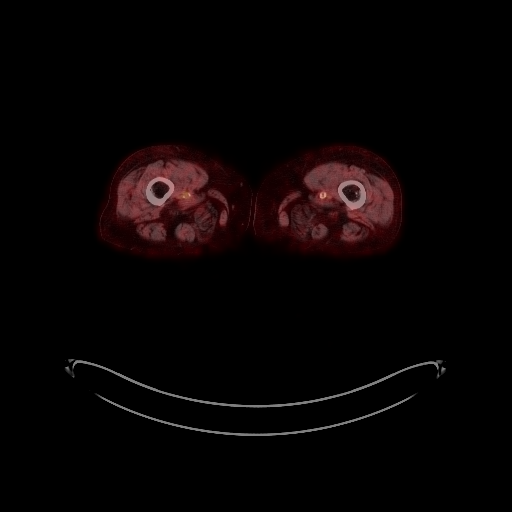
[im 245/408]
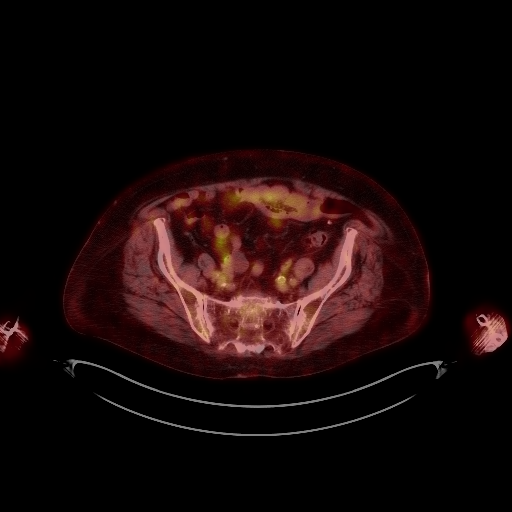
[im 326/408]
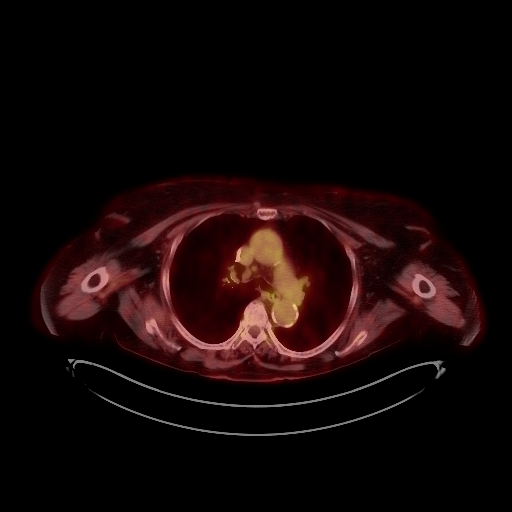
[im 408/408]
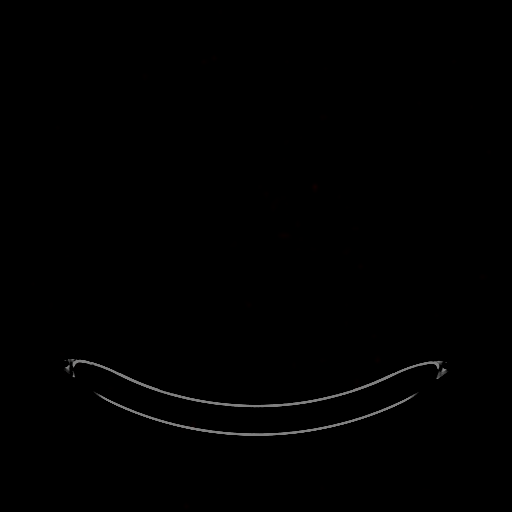

[25 of 25 positions shown; findings below may reference images not displayed]

FINDINGS: Mediastinal blood pool activity: SUV max

HEAD/NECK: No hypermetabolic activity in the scalp. No
hypermetabolic cervical lymph nodes.

Incidental CT findings: none

CHEST: No hypermetabolic mediastinal or hilar nodes. No suspicious
pulmonary nodules on the CT scan.

Incidental CT findings: none

ABDOMEN/PELVIS: No abnormal hypermetabolic activity within the
liver, pancreas, adrenal glands, or spleen. No hypermetabolic lymph
nodes in the abdomen or pelvis.

Incidental CT findings: Atherosclerotic calcification of the aorta.

SKELETON: No focal activity in the axillary appendicular skeleton
suggest active multiple myeloma. Paraspinal activity in the thoracic
is favored brown fat activity

On the CT portion exam no lytic lesions are identified.

Incidental CT findings: none

EXTREMITIES: No suspicious lesions

Incidental CT findings: none
IMPRESSION: 1. No evidence of active multiple myeloma on whole-body FDG PET
scan.
2. No evidence of lytic lesion on CT portion.

## 2021-02-01 DIAGNOSIS — Z6832 Body mass index (BMI) 32.0-32.9, adult: Secondary | ICD-10-CM | POA: Diagnosis not present

## 2021-02-01 DIAGNOSIS — M150 Primary generalized (osteo)arthritis: Secondary | ICD-10-CM | POA: Diagnosis not present

## 2021-02-01 DIAGNOSIS — E538 Deficiency of other specified B group vitamins: Secondary | ICD-10-CM | POA: Diagnosis not present

## 2021-02-01 DIAGNOSIS — F0280 Dementia in other diseases classified elsewhere without behavioral disturbance: Secondary | ICD-10-CM | POA: Diagnosis not present

## 2021-02-01 DIAGNOSIS — E669 Obesity, unspecified: Secondary | ICD-10-CM | POA: Diagnosis not present

## 2021-02-01 DIAGNOSIS — I10 Essential (primary) hypertension: Secondary | ICD-10-CM | POA: Diagnosis not present

## 2021-02-03 DIAGNOSIS — Z6832 Body mass index (BMI) 32.0-32.9, adult: Secondary | ICD-10-CM | POA: Diagnosis not present

## 2021-02-03 DIAGNOSIS — M150 Primary generalized (osteo)arthritis: Secondary | ICD-10-CM | POA: Diagnosis not present

## 2021-02-03 DIAGNOSIS — F0280 Dementia in other diseases classified elsewhere without behavioral disturbance: Secondary | ICD-10-CM | POA: Diagnosis not present

## 2021-02-03 DIAGNOSIS — I10 Essential (primary) hypertension: Secondary | ICD-10-CM | POA: Diagnosis not present

## 2021-02-03 DIAGNOSIS — E538 Deficiency of other specified B group vitamins: Secondary | ICD-10-CM | POA: Diagnosis not present

## 2021-02-03 DIAGNOSIS — E669 Obesity, unspecified: Secondary | ICD-10-CM | POA: Diagnosis not present

## 2021-02-07 DIAGNOSIS — Z6832 Body mass index (BMI) 32.0-32.9, adult: Secondary | ICD-10-CM | POA: Diagnosis not present

## 2021-02-07 DIAGNOSIS — E669 Obesity, unspecified: Secondary | ICD-10-CM | POA: Diagnosis not present

## 2021-02-07 DIAGNOSIS — E538 Deficiency of other specified B group vitamins: Secondary | ICD-10-CM | POA: Diagnosis not present

## 2021-02-07 DIAGNOSIS — I10 Essential (primary) hypertension: Secondary | ICD-10-CM | POA: Diagnosis not present

## 2021-02-07 DIAGNOSIS — M150 Primary generalized (osteo)arthritis: Secondary | ICD-10-CM | POA: Diagnosis not present

## 2021-02-07 DIAGNOSIS — F0280 Dementia in other diseases classified elsewhere without behavioral disturbance: Secondary | ICD-10-CM | POA: Diagnosis not present

## 2021-02-08 DIAGNOSIS — M150 Primary generalized (osteo)arthritis: Secondary | ICD-10-CM | POA: Diagnosis not present

## 2021-02-08 DIAGNOSIS — F0280 Dementia in other diseases classified elsewhere without behavioral disturbance: Secondary | ICD-10-CM | POA: Diagnosis not present

## 2021-02-08 DIAGNOSIS — E538 Deficiency of other specified B group vitamins: Secondary | ICD-10-CM | POA: Diagnosis not present

## 2021-02-09 DIAGNOSIS — E538 Deficiency of other specified B group vitamins: Secondary | ICD-10-CM | POA: Diagnosis not present

## 2021-02-09 DIAGNOSIS — Z6832 Body mass index (BMI) 32.0-32.9, adult: Secondary | ICD-10-CM | POA: Diagnosis not present

## 2021-02-09 DIAGNOSIS — F0280 Dementia in other diseases classified elsewhere without behavioral disturbance: Secondary | ICD-10-CM | POA: Diagnosis not present

## 2021-02-09 DIAGNOSIS — I10 Essential (primary) hypertension: Secondary | ICD-10-CM | POA: Diagnosis not present

## 2021-02-09 DIAGNOSIS — E669 Obesity, unspecified: Secondary | ICD-10-CM | POA: Diagnosis not present

## 2021-02-09 DIAGNOSIS — M150 Primary generalized (osteo)arthritis: Secondary | ICD-10-CM | POA: Diagnosis not present

## 2021-02-10 ENCOUNTER — Ambulatory Visit (INDEPENDENT_AMBULATORY_CARE_PROVIDER_SITE_OTHER): Payer: Medicare Other

## 2021-02-10 DIAGNOSIS — I442 Atrioventricular block, complete: Secondary | ICD-10-CM

## 2021-02-11 LAB — CUP PACEART REMOTE DEVICE CHECK
Battery Impedance: 1019 Ohm
Battery Remaining Longevity: 56 mo
Battery Voltage: 2.77 V
Brady Statistic AP VP Percent: 1 %
Brady Statistic AP VS Percent: 0 %
Brady Statistic AS VP Percent: 99 %
Brady Statistic AS VS Percent: 1 %
Date Time Interrogation Session: 20221006154612
Implantable Lead Implant Date: 20150120
Implantable Lead Implant Date: 20150120
Implantable Lead Location: 753859
Implantable Lead Location: 753860
Implantable Lead Model: 5076
Implantable Lead Model: 5076
Implantable Pulse Generator Implant Date: 20150120
Lead Channel Impedance Value: 485 Ohm
Lead Channel Impedance Value: 489 Ohm
Lead Channel Pacing Threshold Amplitude: 0.375 V
Lead Channel Pacing Threshold Amplitude: 0.875 V
Lead Channel Pacing Threshold Pulse Width: 0.4 ms
Lead Channel Pacing Threshold Pulse Width: 0.4 ms
Lead Channel Setting Pacing Amplitude: 2 V
Lead Channel Setting Pacing Amplitude: 2.5 V
Lead Channel Setting Pacing Pulse Width: 0.4 ms
Lead Channel Setting Sensing Sensitivity: 2.8 mV

## 2021-02-18 NOTE — Progress Notes (Signed)
Remote pacemaker transmission.   

## 2021-02-21 ENCOUNTER — Ambulatory Visit (INDEPENDENT_AMBULATORY_CARE_PROVIDER_SITE_OTHER): Payer: Medicare Other | Admitting: Podiatry

## 2021-02-21 ENCOUNTER — Encounter: Payer: Self-pay | Admitting: Podiatry

## 2021-02-21 ENCOUNTER — Other Ambulatory Visit: Payer: Self-pay

## 2021-02-21 DIAGNOSIS — I10 Essential (primary) hypertension: Secondary | ICD-10-CM | POA: Diagnosis not present

## 2021-02-21 DIAGNOSIS — F0390 Unspecified dementia without behavioral disturbance: Secondary | ICD-10-CM | POA: Diagnosis not present

## 2021-02-21 DIAGNOSIS — E559 Vitamin D deficiency, unspecified: Secondary | ICD-10-CM | POA: Diagnosis not present

## 2021-02-21 DIAGNOSIS — E785 Hyperlipidemia, unspecified: Secondary | ICD-10-CM | POA: Diagnosis not present

## 2021-02-21 DIAGNOSIS — L84 Corns and callosities: Secondary | ICD-10-CM

## 2021-02-21 DIAGNOSIS — M2042 Other hammer toe(s) (acquired), left foot: Secondary | ICD-10-CM

## 2021-02-21 NOTE — Progress Notes (Signed)
Subjective:  Patient ID: Theresa Norton, female    DOB: 04/26/1929,   MRN: [id number]  Chief Complaint  Patient presents with   Toe Pain    3rd toe left - callused tip, tender    88 y.o. female presents for callus on left third toe for which she has seen Dr. Aasia Lucks in the past.. Relates she keeps a bandaid over it to cushion it. Here today with her son and states they were hoping to have a procedure to straighten the to more and relief the pressure. . Denies any other pedal complaints. Denies n/v/f/c.   Past Medical History:  Diagnosis Date   Allergy    Anemia    Arthritis    "in my fingers" (07/08/2013)   CAD (coronary artery disease)    a. 05/2013 nonobs dzs by cath.   Colon polyps    Groin hematoma    a. 05/2013 R groin hematoma post-cath - u/s 05/31/13 large 7.49 cm r inguinal hematoma extending into thigh, no psa or avf.   H/O hiatal hernia    Heart block AV second degree    a. s/p MDT Addapta dual chamber pacemaker 05/2013 by Dr Rayann Heman.   Hypertension    On home oxygen therapy    "2L maybe 12h/day" (07/08/2013)   Pacemaker    Pacemaker    PAF (paroxysmal atrial fibrillation) ([facility])    a. Found post-pacer 05/2013->Xarelto added;  b. 05/2013 Echo: EF 60-65%, mild LVH, nl wall motion w/o rwma.    Objective:  Physical Exam: Vascular: DP/PT pulses 2/4 bilateral. CFT <3 seconds. Normal hair growth on digits. No edema.  Skin. No lacerations or abrasions bilateral feet. Hyperkeratoti lesion noted to distal Left third digit.  Musculoskeletal: MMT 5/5 bilateral lower extremities in DF, PF, Inversion and Eversion. Deceased ROM in DF of ankle joint. Third digit flexible hammertoe. Tender to area of hyperkeratotic lesion.  Neurological: Sensation intact to light touch.   Assessment:   1. Callus of foot   2. Hammertoe of left foot      Plan:  Patient was evaluated and treated and all questions answered. -X-rays reviewed. -Educated on hammertoes and treatment options  -Discussed  flexor tenotomy of the toe to relief the callused area on the tip. Patient would like to proceed with procedure. Note below.  -Hyperkeratotic lesion to left third digit debrided without incident.  -Patient to follow-up in 2 weeks or sooner if any concerns.   Patient requesting removal of ingrown nail today. Procedure below.  Discussed procedure and post procedure care and patient expressed understanding.  Will follow-up in 2 weeks for nail check or sooner if any problems arise.    Procedure:  Procedure: Tenotomy of left third digit.  Surgeon: Lorenda Peck, DPM  Pre-op Dx: Hammertoe left third digit.  Post-op: Same  Place of Surgery: Office exam room.  Indications for surgery: Painful hammertoe and lesion  Findings: Hammered digit.    The patient is requesting tenotomy of left third digit. Risks and complications were discussed with the patient for which they understand and written consent was obtained. Under sterile conditions a total of 72mL of  and 1% lidocaine plain was infiltrated in a toe block fashion. Once anesthetized, the skin was prepped in sterile fashion. A tourniquet was then applied. Next an 18 gauge was inserted into the sulcus of the PIPJ of third digit and tendon release. Third digit was felt to look straighter after procedure.   After  the tourniquet was removed and  there is found to be an immediate capillary refill time to the digit. The patient tolerated the procedure well without any complications. Post procedure instructions were discussed the patient for which he verbally understood. Follow-up in two weeks for incision check or sooner if any problems are to arise. Discussed signs/symptoms of infection and directed to call the office immediately should any occur or go directly to the emergency room. In the meantime, encouraged to call the office with any questions, concerns, changes symptoms.     Lorenda Peck, DPM

## 2021-02-21 NOTE — Patient Instructions (Signed)
Soak Instructions    THE THIRD DAY AFTER THE PROCEDURE  Place 1/4 cup of epsom salts (or betadine, or white vinegar) in a quart of warm tap water.  Submerge your foot or feet with outer bandage intact for the initial soak; this will allow the bandage to become moist and wet for easy lift off.  Once you remove your bandage, continue to soak in the solution for 20 minutes.  This soak should be done twice a day.  Next, remove your foot or feet from solution, blot dry the affected area and cover.  You may use a band aid large enough to cover the area or use gauze and tape.  Apply other medications to the area as directed by the doctor such as polysporin neosporin.  IF YOUR SKIN BECOMES IRRITATED WHILE USING THESE INSTRUCTIONS, IT IS OKAY TO SWITCH TO  WHITE VINEGAR AND WATER. Or you may use antibacterial soap and water to keep the toe clean  Monitor for any signs/symptoms of infection. Call the office immediately if any occur or go directly to the emergency room. Call with any questions/concerns.

## 2021-02-23 DIAGNOSIS — F0390 Unspecified dementia without behavioral disturbance: Secondary | ICD-10-CM | POA: Diagnosis not present

## 2021-02-24 DIAGNOSIS — E785 Hyperlipidemia, unspecified: Secondary | ICD-10-CM | POA: Diagnosis not present

## 2021-02-24 DIAGNOSIS — I10 Essential (primary) hypertension: Secondary | ICD-10-CM | POA: Diagnosis not present

## 2021-03-09 ENCOUNTER — Ambulatory Visit: Payer: Medicare Other | Admitting: Podiatry

## 2021-03-14 ENCOUNTER — Telehealth: Payer: Self-pay | Admitting: *Deleted

## 2021-03-14 DIAGNOSIS — E559 Vitamin D deficiency, unspecified: Secondary | ICD-10-CM | POA: Diagnosis not present

## 2021-03-14 DIAGNOSIS — R600 Localized edema: Secondary | ICD-10-CM | POA: Diagnosis not present

## 2021-03-14 NOTE — Telephone Encounter (Signed)
Called and gave verbal orders to d/c order for foot soaks and daily bandaging for patient per Dr Letta Median with [facility].

## 2021-03-16 ENCOUNTER — Ambulatory Visit (INDEPENDENT_AMBULATORY_CARE_PROVIDER_SITE_OTHER): Payer: Medicare Other | Admitting: Podiatry

## 2021-03-16 ENCOUNTER — Other Ambulatory Visit: Payer: Self-pay

## 2021-03-16 ENCOUNTER — Encounter: Payer: Self-pay | Admitting: Podiatry

## 2021-03-16 DIAGNOSIS — M2042 Other hammer toe(s) (acquired), left foot: Secondary | ICD-10-CM

## 2021-03-16 DIAGNOSIS — L84 Corns and callosities: Secondary | ICD-10-CM

## 2021-03-16 NOTE — Progress Notes (Signed)
  Subjective:  Patient ID: Theresa Norton, female    DOB: Feb 25, 1929,   MRN: [id number]  Chief Complaint  Patient presents with   Follow-up    2 week follow up tenotomy     87 y.o. female presents for follow-up of left third digit tenotomy. Patient doing well. Denies any pain. No issues  . Denies any other pedal complaints. Denies n/v/f/c.   Past Medical History:  Diagnosis Date   Allergy    Anemia    Arthritis    "in my fingers" (07/08/2013)   CAD (coronary artery disease)    a. 05/2013 nonobs dzs by cath.   Colon polyps    Groin hematoma    a. 05/2013 R groin hematoma post-cath - u/s 05/31/13 large 7.49 cm r inguinal hematoma extending into thigh, no psa or avf.   H/O hiatal hernia    Heart block AV second degree    a. s/p MDT Addapta dual chamber pacemaker 05/2013 by Dr Rayann Heman.   Hypertension    On home oxygen therapy    "2L maybe 12h/day" (07/08/2013)   Pacemaker    Pacemaker    PAF (paroxysmal atrial fibrillation) ([place])    a. Found post-pacer 05/2013->Xarelto added;  b. 05/2013 Echo: EF 60-65%, mild LVH, nl wall motion w/o rwma.    Objective:  Physical Exam: Vascular: DP/PT pulses 2/4 bilateral. CFT <3 seconds. Normal hair growth on digits. No edema.  Skin. No lacerations or abrasions bilateral feet. Incision plantar third digit healed no dehscience. No erythema edema or purulence noted.  Musculoskeletal: MMT 5/5 bilateral lower extremities in DF, PF, Inversion and Eversion. Deceased ROM in DF of ankle joint.  Neurological: Sensation intact to light touch.   Assessment:   1. Hammertoe of left foot   2. Callus of foot      Plan:  Patient was evaluated and treated and all questions answered. Toe was evaluated and appears to be healing well.  May discontinue soaks  and bandages. .  Patient to follow-up as needed.    Lorenda Peck, DPM

## 2021-03-22 DIAGNOSIS — F0280 Dementia in other diseases classified elsewhere without behavioral disturbance: Secondary | ICD-10-CM | POA: Diagnosis not present

## 2021-03-22 DIAGNOSIS — F03B3 Unspecified dementia, moderate, with mood disturbance: Secondary | ICD-10-CM | POA: Diagnosis not present

## 2021-03-29 DIAGNOSIS — F0280 Dementia in other diseases classified elsewhere without behavioral disturbance: Secondary | ICD-10-CM | POA: Diagnosis not present

## 2021-03-29 DIAGNOSIS — F03B3 Unspecified dementia, moderate, with mood disturbance: Secondary | ICD-10-CM | POA: Diagnosis not present

## 2021-04-11 DIAGNOSIS — I10 Essential (primary) hypertension: Secondary | ICD-10-CM | POA: Diagnosis not present

## 2021-04-11 DIAGNOSIS — F0280 Dementia in other diseases classified elsewhere without behavioral disturbance: Secondary | ICD-10-CM | POA: Diagnosis not present

## 2021-04-11 DIAGNOSIS — E559 Vitamin D deficiency, unspecified: Secondary | ICD-10-CM | POA: Diagnosis not present

## 2021-04-11 DIAGNOSIS — E785 Hyperlipidemia, unspecified: Secondary | ICD-10-CM | POA: Diagnosis not present

## 2021-04-12 DIAGNOSIS — F331 Major depressive disorder, recurrent, moderate: Secondary | ICD-10-CM | POA: Diagnosis not present

## 2021-04-21 DIAGNOSIS — F0280 Dementia in other diseases classified elsewhere without behavioral disturbance: Secondary | ICD-10-CM | POA: Diagnosis not present

## 2021-04-21 DIAGNOSIS — F331 Major depressive disorder, recurrent, moderate: Secondary | ICD-10-CM | POA: Diagnosis not present

## 2021-04-21 DIAGNOSIS — F0390 Unspecified dementia without behavioral disturbance: Secondary | ICD-10-CM | POA: Diagnosis not present

## 2021-04-26 DIAGNOSIS — F331 Major depressive disorder, recurrent, moderate: Secondary | ICD-10-CM | POA: Diagnosis not present

## 2021-05-09 DIAGNOSIS — F0390 Unspecified dementia without behavioral disturbance: Secondary | ICD-10-CM | POA: Diagnosis not present

## 2021-05-09 DIAGNOSIS — E559 Vitamin D deficiency, unspecified: Secondary | ICD-10-CM | POA: Diagnosis not present

## 2021-05-09 DIAGNOSIS — F0280 Dementia in other diseases classified elsewhere without behavioral disturbance: Secondary | ICD-10-CM | POA: Diagnosis not present

## 2021-05-09 DIAGNOSIS — E785 Hyperlipidemia, unspecified: Secondary | ICD-10-CM | POA: Diagnosis not present

## 2021-05-09 DIAGNOSIS — I10 Essential (primary) hypertension: Secondary | ICD-10-CM | POA: Diagnosis not present

## 2021-05-12 ENCOUNTER — Ambulatory Visit (INDEPENDENT_AMBULATORY_CARE_PROVIDER_SITE_OTHER): Payer: Medicare Other

## 2021-05-12 DIAGNOSIS — I442 Atrioventricular block, complete: Secondary | ICD-10-CM

## 2021-05-13 LAB — CUP PACEART REMOTE DEVICE CHECK
Battery Impedance: 1152 Ohm
Battery Remaining Longevity: 52 mo
Battery Voltage: 2.77 V
Brady Statistic AP VP Percent: 1 %
Brady Statistic AP VS Percent: 0 %
Brady Statistic AS VP Percent: 99 %
Brady Statistic AS VS Percent: 1 %
Date Time Interrogation Session: 20230105142723
Implantable Lead Implant Date: 20150120
Implantable Lead Implant Date: 20150120
Implantable Lead Location: 753859
Implantable Lead Location: 753860
Implantable Lead Model: 5076
Implantable Lead Model: 5076
Implantable Pulse Generator Implant Date: 20150120
Lead Channel Impedance Value: 500 Ohm
Lead Channel Impedance Value: 517 Ohm
Lead Channel Pacing Threshold Amplitude: 0.5 V
Lead Channel Pacing Threshold Amplitude: 1 V
Lead Channel Pacing Threshold Pulse Width: 0.4 ms
Lead Channel Pacing Threshold Pulse Width: 0.4 ms
Lead Channel Setting Pacing Amplitude: 2 V
Lead Channel Setting Pacing Amplitude: 2.5 V
Lead Channel Setting Pacing Pulse Width: 0.4 ms
Lead Channel Setting Sensing Sensitivity: 2.8 mV

## 2021-05-17 DIAGNOSIS — F331 Major depressive disorder, recurrent, moderate: Secondary | ICD-10-CM | POA: Diagnosis not present

## 2021-05-19 DIAGNOSIS — F0390 Unspecified dementia without behavioral disturbance: Secondary | ICD-10-CM | POA: Diagnosis not present

## 2021-05-19 DIAGNOSIS — F331 Major depressive disorder, recurrent, moderate: Secondary | ICD-10-CM | POA: Diagnosis not present

## 2021-05-19 DIAGNOSIS — F0280 Dementia in other diseases classified elsewhere without behavioral disturbance: Secondary | ICD-10-CM | POA: Diagnosis not present

## 2021-05-23 NOTE — Progress Notes (Signed)
Remote pacemaker transmission.   

## 2021-06-06 DIAGNOSIS — F331 Major depressive disorder, recurrent, moderate: Secondary | ICD-10-CM | POA: Diagnosis not present

## 2021-06-16 DIAGNOSIS — F331 Major depressive disorder, recurrent, moderate: Secondary | ICD-10-CM | POA: Diagnosis not present

## 2021-06-16 DIAGNOSIS — F0390 Unspecified dementia without behavioral disturbance: Secondary | ICD-10-CM | POA: Diagnosis not present

## 2021-06-16 DIAGNOSIS — F0280 Dementia in other diseases classified elsewhere without behavioral disturbance: Secondary | ICD-10-CM | POA: Diagnosis not present

## 2021-06-20 DIAGNOSIS — F331 Major depressive disorder, recurrent, moderate: Secondary | ICD-10-CM | POA: Diagnosis not present

## 2021-07-04 DIAGNOSIS — F331 Major depressive disorder, recurrent, moderate: Secondary | ICD-10-CM | POA: Diagnosis not present

## 2021-07-06 ENCOUNTER — Ambulatory Visit: Payer: Medicare Other

## 2021-07-12 ENCOUNTER — Ambulatory Visit (INDEPENDENT_AMBULATORY_CARE_PROVIDER_SITE_OTHER): Payer: Medicare Other

## 2021-07-12 VITALS — Ht 64.0 in | Wt 161.0 lb

## 2021-07-12 DIAGNOSIS — Z0000 Encounter for general adult medical examination without abnormal findings: Secondary | ICD-10-CM

## 2021-07-12 NOTE — Progress Notes (Signed)
Subjective:   Theresa Norton is a 88 y.o. female who presents for Medicare Annual (Subsequent) preventive examination.  Review of Systems    Virtual Visit via Telephone Note  I connected with  Attallah Ontko Boyajian on 07/12/21 at  1:15 PM EST by telephone and verified that I am speaking with the correct person using two identifiers.  Location: Patient: Home Provider: Office Persons participating in the virtual visit: patient/Nurse Health Advisor   I discussed the limitations, risks, security and privacy concerns of performing an evaluation and management service by telephone and the availability of in person appointments. The patient expressed understanding and agreed to proceed.  Interactive audio and video telecommunications were attempted between this nurse and patient, however failed, due to patient having technical difficulties OR patient did not have access to video capability.  We continued and completed visit with audio only.  Some vital signs may be absent or patient reported.   Criselda Peaches, LPN  Cardiac Risk Factors include: advanced age (>50mn, >>67women);hypertension     Objective:    Today's Vitals   07/12/21 1316 07/12/21 1317  Weight: 161 lb (73 kg)   Height: '5\' 4"'$  (1.626 m)   PainSc:  6    Body mass index is 27.64 kg/m.  Advanced Directives 07/12/2021 10/11/2020 06/30/2020 06/11/2019 02/13/2018 01/23/2017 02/12/2016  Does Patient Have a Medical Advance Directive? Yes Unable to assess, patient is non-responsive or altered mental status Yes Yes Yes Yes No  Type of Advance Directive [place] will - Healthcare Power of [place] will - - -  Does patient want to make changes to medical advance directive? No - Patient declined - - No - Patient declined - - -  Copy of [place]? No - copy requested - No - copy requested No - copy requested - - -  Would patient like information on  creating a medical advance directive? - - - - - - No - patient declined information  Pre-existing out of facility DNR order (yellow form or pink MOST form) - - - - - - -    Current Medications (verified) Outpatient Encounter Medications as of 07/12/2021  Medication Sig   amLODipine (NORVASC) 5 MG tablet TAKE 1 TABLET BY MOUTH EVERY DAY (Patient taking differently: Take 5 mg by mouth daily.)   beta carotene w/minerals (OCUVITE) tablet Take 1 tablet by mouth daily.   calcitonin, salmon, (MIACALCIN) 200 UNIT/ACT nasal spray Place 1 spray into alternate nostrils daily.   donepezil (ARICEPT) 5 MG tablet Take 5 mg by mouth daily.   furosemide (LASIX) 20 MG tablet TAKE 1 TABLET BY MOUTH DAILY AS NEEDED FOR LEG SWELLING   ibuprofen (ADVIL) 200 MG tablet Take 400 mg by mouth every 6 (six) hours as needed for mild pain.   lidocaine (LIDODERM) 5 % 1 patch daily.   loratadine (CLARITIN) 10 MG tablet Take 10 mg by mouth daily.   Multiple Vitamin (MULTIVITAMIN ADULT PO) Take 1 tablet by mouth daily.   Thiamine HCl (VITAMIN B-1 PO) Take 1 tablet by mouth daily.   traMADol (ULTRAM) 50 MG tablet TAKE 1 TABLET BY MOUTH EVERY 6 HOURS AS NEEDED FOR UP TO 5 DAYS.   TURMERIC PO Take 1 capsule by mouth daily.   vitamin B-12 (CYANOCOBALAMIN) 500 MCG tablet Take 500 mcg by mouth daily.   No facility-administered encounter medications on file as of 07/12/2021.    Allergies (verified) Codeine, Morphine  and related, and Other   History: Past Medical History:  Diagnosis Date   Allergy    Anemia    Arthritis    "in my fingers" (07/08/2013)   CAD (coronary artery disease)    a. 05/2013 nonobs dzs by cath.   Colon polyps    Groin hematoma    a. 05/2013 R groin hematoma post-cath - u/s 05/31/13 large 7.49 cm r inguinal hematoma extending into thigh, no psa or avf.   H/O hiatal hernia    Heart block AV second degree    a. s/p MDT Addapta dual chamber pacemaker 05/2013 by Dr Rayann Heman.   Hypertension    On home oxygen  therapy    "2L maybe 12h/day" (07/08/2013)   Pacemaker    Pacemaker    PAF (paroxysmal atrial fibrillation) ([place])    a. Found post-pacer 05/2013->Xarelto added;  b. 05/2013 Echo: EF 60-65%, mild LVH, nl wall motion w/o rwma.   Past Surgical History:  Procedure Laterality Date   BREAST CYST EXCISION Right 1959; 1970's   BUNIONECTOMY Bilateral 1967   CARDIAC CATHETERIZATION     CATARACT EXTRACTION W/ INTRAOCULAR LENS  IMPLANT, BILATERAL Bilateral    DILATION AND CURETTAGE OF UTERUS     INSERT / REPLACE / REMOVE PACEMAKER  05/2013   MDT [id number] implanted by Dr Rayann Heman for heart block   INSERT / REPLACE / REMOVE PACEMAKER  06/2013   Right ventricular lead perforation with tamponade   LEAD REVISION N/A 06/13/2013   Procedure: LEAD REVISION;  Surgeon: Coralyn Mark, MD;  Location: [facility];  Service: Cardiovascular;  Laterality: N/A;   LEFT HEART CATHETERIZATION WITH CORONARY ANGIOGRAM N/A 05/27/2013   Procedure: LEFT HEART CATHETERIZATION WITH CORONARY ANGIOGRAM;  Surgeon: Birdie Riddle, MD;  Location: [facility];  Service: Cardiovascular;  Laterality: N/A;   PERICARDIAL TAP N/A 06/11/2013   Procedure: PERICARDIAL TAP;  Surgeon: Birdie Riddle, MD;  Location: [facility];  Service: Cardiovascular;  Laterality: N/A;   PERMANENT PACEMAKER INSERTION N/A 05/27/2013   Procedure: PERMANENT PACEMAKER INSERTION;  Surgeon: Coralyn Mark, MD;  Location: [facility];  Service: Cardiovascular;  Laterality: N/A;   TEMPORARY PACEMAKER INSERTION N/A 05/27/2013   Procedure: TEMPORARY PACEMAKER INSERTION;  Surgeon: Birdie Riddle, MD;  Location: [facility];  Service: Cardiovascular;  Laterality: N/A;   VARICOSE VEIN SURGERY Bilateral ~ 2002   Family History  Problem Relation Age of Onset   Cancer Father        lung   Social History   Socioeconomic History   Marital status: Widowed    Spouse name: Not on file   Number of children: Not on file   Years of education: Not on file   Highest education level:  Not on file  Occupational History   Not on file  Tobacco Use   Smoking status: Never   Smokeless tobacco: Never  Vaping Use   Vaping Use: Never used  Substance and Sexual Activity   Alcohol use: Yes    Alcohol/week: 14.0 standard drinks    Types: 7 Glasses of wine, 7 Cans of beer per week    Comment: 2019: wine in the evening    Drug use: No   Sexual activity: Never  Other Topics Concern   Not on file  Social History Narrative   Living at [place] Strain: Low Risk    Difficulty of Paying Living Expenses: Not hard  at all  Food Insecurity: No Food Insecurity   Worried About Charity fundraiser in the Last Year: Never true   Ran Out of Food in the Last Year: Never true  Transportation Needs: No Transportation Needs   Lack of Transportation (Medical): No   Lack of Transportation (Non-Medical): No  Physical Activity: Inactive   Days of Exercise per Week: 0 days   Minutes of Exercise per Session: 0 min  Stress: No Stress Concern Present   Feeling of Stress : Not at all  Social Connections: Socially Isolated   Frequency of Communication with Friends and Family: More than three times a week   Frequency of Social Gatherings with Friends and Family: More than three times a week   Attends Religious Services: Never   Marine scientist or Organizations: No   Attends Archivist Meetings: Never   Marital Status: Widowed    Clinical Intake:  Pre-visit preparation completed: YesDiabetic?  No  Interpreter Needed?: NoActivities of Daily Living In your present state of health, do you have any difficulty performing the following activities: 07/12/2021  Hearing? N  Vision? N  Difficulty concentrating or making decisions? Y  Comment Son assist  Walking or climbing stairs? N  Dressing or bathing? Y  Comment Assisted living facility  Doing errands, shopping? Y  Comment Son [place] and eating ? Y   Comment Assisted living facility  Using the Toilet? N  In the past six months, have you accidently leaked urine? Y  Comment Wears breifs  Do you have problems with loss of bowel control? N  Managing your Medications? Y  Comment Assisted living facility  Managing your Finances? Y  Comment Son assist  Some recent data might be hidden    Patient Care Team: Eulas Post, MD as PCP - General (Family Medicine) [facility] as Registered Nurse  Indicate any recent Medical Services you may have received from other than [facility] providers in the past year (date may be approximate).     Assessment:   This is a routine wellness examination for Shyann.  Hearing/Vision screen Hearing Screening - Comments:: No difficulty hearing Vision Screening - Comments:: No vision difficulty.   Dietary issues and exercise activities discussed: Exercise limited by: None identified   Goals Addressed               This Visit's Progress     Patient Stated (pt-stated)        Keep living and stay healthy.       Depression Screen PHQ 2/9 Scores 07/12/2021 11/29/2020 06/30/2020 06/11/2019 03/11/2018 01/23/2017 04/22/2015  PHQ - 2 Score 1 0 0 0 1 0 0    Fall Risk Fall Risk  07/12/2021 11/29/2020 06/30/2020 06/11/2019 04/02/2019  Falls in the past year? '1 1 1 '$ 0 0  Comment - - - - [facility]i Telephone Survey: data to providers prior to load  Number falls in past yr: 0 0 1 - -  Injury with Fall? '1 1 1 '$ - -  Comment Back injury. Followed by PCP - - - -  Risk for fall due to : Impaired balance/gait History of fall(s) Impaired mobility;Impaired balance/gait;History of fall(s) Medication side effect;Other (Comment) -  Risk for fall due to: Comment - - - oxygen patient -  Follow up - Falls evaluation completed Falls prevention discussed Falls evaluation completed;Education provided;Falls prevention discussed -    FALL RISK PREVENTION PERTAINING TO THE HOME:  Any stairs in  or around the home? Yes  If  so, are there any without handrails? No  Home free of loose throw rugs in walkways, pet beds, electrical cords, etc? Yes  Adequate lighting in your home to reduce risk of falls? Yes   ASSISTIVE DEVICES UTILIZED TO PREVENT FALLS:  Life alert? No  Use of a cane, walker or w/c? Yes  Grab bars in the bathroom? Yes  Shower chair or bench in shower? Yes  Elevated toilet seat or a handicapped toilet? Yes   TIMED UP AND GO:  Was the test performed? No .     Cognitive Function: MMSE - Mini Mental State Exam 01/23/2017  Orientation to time 3  Orientation to Place 5  Registration 3  Attention/ Calculation 5  Recall 3  Language- name 2 objects 2  Language- repeat 1  Language- follow 3 step command 3  Language- read & follow direction 1  Write a sentence 1  Copy design 1  Total score 28     6CIT Screen 07/12/2021  What Year? 4 points  What month? 0 points  What time? 3 points  Count back from 20 0 points  Months in reverse 4 points  Repeat phrase 0 points  Total Score 11    Immunizations Immunization History  Administered Date(s) Administered   Moderna Sars-Covid-2 Vaccination 05/26/2019, 06/23/2019, 03/04/2020   Td 04/10/1997    TDAP status: Due, Education has been provided regarding the importance of this vaccine. Advised may receive this vaccine at local pharmacy or Health Dept. Aware to provide a copy of the vaccination record if obtained from local pharmacy or Health Dept. Verbalized acceptance and understanding.  Flu Vaccine status: Declined, Education has been provided regarding the importance of this vaccine but patient still declined. Advised may receive this vaccine at local pharmacy or Health Dept. Aware to provide a copy of the vaccination record if obtained from local pharmacy or Health Dept. Verbalized acceptance and understanding.  Pneumococcal vaccine status: Declined,  Education has been provided regarding the importance of this vaccine but patient still  declined. Advised may receive this vaccine at local pharmacy or Health Dept. Aware to provide a copy of the vaccination record if obtained from local pharmacy or Health Dept. Verbalized acceptance and understanding.   Covid-19 vaccine status: Completed vaccines  Qualifies for Shingles Vaccine? Yes   Zostavax completed No   Shingrix Completed?: No.    Education has been provided regarding the importance of this vaccine. Patient has been advised to call insurance company to determine out of pocket expense if they have not yet received this vaccine. Advised may also receive vaccine at local pharmacy or Health Dept. Verbalized acceptance and understanding.  Screening Tests Health Maintenance  Topic Date Due   DEXA SCAN  Never done   TETANUS/TDAP  04/11/2007   COVID-19 Vaccine (4 - Booster for Moderna series) 04/29/2020   INFLUENZA VACCINE  08/05/2021 (Originally 12/06/2020)   Zoster Vaccines- Shingrix (1 of 2) 10/12/2021 (Originally [birth year]/1949)   Pneumonia Vaccine 60+ Years old (1 - PCV) 07/13/2022 (Originally 09/05/1936)   HPV [place] Maintenance Due  Topic Date Due   DEXA SCAN  Never done   TETANUS/TDAP  04/11/2007   COVID-19 Vaccine (4 - Booster for Moderna series) 04/29/2020    Colorectal cancer screening: No longer required.   Mammogram status: No longer required due to Age.  Lung Cancer Screening: (Low Dose CT Chest recommended if Age 68-80 years,  30 pack-year currently smoking OR have quit w/in 15years.) does not qualify.   Additional Screening:  Hepatitis C Screening: does not qualify; Completed   Vision Screening: Recommended annual ophthalmology exams for early detection of glaucoma and other disorders of the eye. Is the patient up to date with their annual eye exam?  No  Who is the provider or what is the name of the office in which the patient attends annual eye exams? Patient deferred If pt is not established with a provider, would  they like to be referred to a provider to establish care? No .   Dental Screening: Recommended annual dental exams for proper oral hygiene  Community Resource Referral / Chronic Care Management:  CRR required this visit?  No   CCM required this visit?  No      Plan:     I have personally reviewed and noted the following in the patients chart:   Medical and social history Use of alcohol, tobacco or illicit drugs  Current medications and supplements including opioid prescriptions. Patient currently taking opioids Functional ability and status Nutritional status Physical activity Advanced directives List of other physicians Hospitalizations, surgeries, and ER visits in previous 12 months Vitals Screenings to include cognitive, depression, and falls Referrals and appointments  In addition, I have reviewed and discussed with patient certain preventive protocols, quality metrics, and best practice recommendations. A written personalized care plan for preventive services as well as general preventive health recommendations were provided to patient.     Criselda Peaches, LPN   11/12/4126   Nurse Notes: POA/Son would like f/u to discuss and be advised on obtaining health care/medical records from current facility.

## 2021-07-12 NOTE — Patient Instructions (Addendum)
Theresa Norton , Thank you for taking time to come for your Medicare Wellness Visit. I appreciate your ongoing commitment to your health goals. Please review the following plan we discussed and let me know if I can assist you in the future.   These are the goals we discussed:  Goals       DIET - INCREASE WATER INTAKE      exercise 150 minutes every week      Patient Stated (pt-stated)      Keep living and stay healthy.        This is a list of the screening recommended for you and due dates:  Health Maintenance  Topic Date Due   DEXA scan (bone density measurement)  Never done   Tetanus Vaccine  04/11/2007   COVID-19 Vaccine (4 - Booster for Moderna series) 04/29/2020   Flu Shot  08/05/2021*   Zoster (Shingles) Vaccine (1 of 2) 10/12/2021*   Pneumonia Vaccine (1 - PCV) 07/13/2022*   HPV Vaccine  Aged Out  *Topic was postponed. The date shown is not the original due date.    Opioid Pain Medicine Management Opioids are powerful medicines that are used to treat moderate to severe pain. When used for short periods of time, they can help you to: Sleep better. Do better in physical or occupational therapy. Feel better in the first few days after an injury. Recover from surgery. Opioids should be taken with the supervision of a trained health care provider. They should be taken for the shortest period of time possible. This is because opioids can be addictive, and the longer you take opioids, the greater your risk of addiction. This addiction can also be called opioid use disorder. What are the risks? Using opioid pain medicines for longer than 3 days increases your risk of side effects. Side effects include: Constipation. Nausea and vomiting. Breathing difficulties (respiratory depression). Drowsiness. Confusion. Opioid use disorder. Itching. Taking opioid pain medicine for a long period of time can affect your ability to do daily tasks. It also puts you at risk for: Motor vehicle  crashes. Depression. Suicide. Heart attack. Overdose, which can be life-threatening. What is a pain treatment plan? A pain treatment plan is an agreement between you and your health care provider. Pain is unique to each person, and treatments vary depending on your condition. To manage your pain, you and your health care provider need to work together. To help you do this: Discuss the goals of your treatment, including how much pain you might expect to have and how you will manage the pain. Review the risks and benefits of taking opioid medicines. Remember that a good treatment plan uses more than one approach and minimizes the chance of side effects. Be honest about the amount of medicines you take and about any drug or alcohol use. Get pain medicine prescriptions from only one health care provider. Pain can be managed with many types of alternative treatments. Ask your health care provider to refer you to one or more specialists who can help you manage pain through: Physical or occupational therapy. Counseling (cognitive behavioral therapy). Good nutrition. Biofeedback. Massage. Meditation. Non-opioid medicine. Following a gentle exercise program. How to use opioid pain medicine Taking medicine Take your pain medicine exactly as told by your health care provider. Take it only when you need it. If your pain gets less severe, you may take less than your prescribed dose if your health care provider approves. If you are not having pain, do  nottake pain medicine unless your health care provider tells you to take it. If your pain is severe, do nottry to treat it yourself by taking more pills than instructed on your prescription. Contact your health care provider for help. Write down the times when you take your pain medicine. It is easy to become confused while on pain medicine. Writing the time can help you avoid overdose. Take other over-the-counter or prescription medicines only as told by  your health care provider. Keeping yourself and others safe  While you are taking opioid pain medicine: Do not drive, use machinery, or power tools. Do not sign legal documents. Do not drink alcohol. Do not take sleeping pills. Do not supervise children by yourself. Do not do activities that require climbing or being in high places. Do not go to a lake, river, ocean, spa, or swimming pool. Do not share your pain medicine with anyone. Keep pain medicine in a locked cabinet or in a secure area where pets and children cannot reach it. Stopping your use of opioids If you have been taking opioid medicine for more than a few weeks, you may need to slowly decrease (taper) how much you take until you stop completely. Tapering your use of opioids can decrease your risk of symptoms of withdrawal, such as: Pain and cramping in the abdomen. Nausea. Sweating. Sleepiness. Restlessness. Uncontrollable shaking (tremors). Cravings for the medicine. Do not attempt to taper your use of opioids on your own. Talk with your health care provider about how to do this. Your health care provider may prescribe a step-down schedule based on how much medicine you are taking and how long you have been taking it. Getting rid of leftover pills Do not save any leftover pills. Get rid of leftover pills safely by: Taking the medicine to a prescription take-back program. This is usually offered by the county or law enforcement. Bringing them to a pharmacy that has a drug disposal container. Flushing them down the toilet. Check the label or package insert of your medicine to see whether this is safe to do. Throwing them out in the trash. Check the label or package insert of your medicine to see whether this is safe to do. If it is safe to throw it out, remove the medicine from the original container, put it into a sealable bag or container, and mix it with used coffee grounds, food scraps, dirt, or cat litter before putting  it in the trash. Follow these instructions at home: Activity Do exercises as told by your health care provider. Avoid activities that make your pain worse. Return to your normal activities as told by your health care provider. Ask your health care provider what activities are safe for you. General instructions You may need to take these actions to prevent or treat constipation: Drink enough fluid to keep your urine pale yellow. Take over-the-counter or prescription medicines. Eat foods that are high in fiber, such as beans, whole grains, and fresh fruits and vegetables. Limit foods that are high in fat and processed sugars, such as fried or sweet foods. Keep all follow-up visits. This is important. Where to find support If you have been taking opioids for a long time, you may benefit from receiving support for quitting from a local support group or counselor. Ask your health care provider for a referral to these resources in your area. Where to find more information Centers for Disease Control and Prevention (CDC): http://www.wolf.info/ [place] Food and Drug Administration (FDA): [place].ch Get  help right away if: You may have taken too much of an opioid (overdosed). Common symptoms of an overdose: Your breathing is slower or more shallow than normal. You have a very slow heartbeat (pulse). You have slurred speech. You have nausea and vomiting. Your pupils become very small. You have other potential symptoms: You are very confused. You faint or feel like you will faint. You have cold, clammy skin. You have blue lips or fingernails. You have thoughts of harming yourself or harming others. These symptoms may represent a serious problem that is an emergency. Do not wait to see if the symptoms will go away. Get medical help right away. Call your local emergency services (911 in the [place] Do not drive yourself to the hospital.  If you ever feel like you may hurt yourself or others, or have thoughts  about taking your own life, get help right away. Go to your nearest emergency department or: Call your local emergency services (911 in the [place] Call the [facility] 814-608-3041 in the [place] Call a suicide crisis helpline, such as the [facility] at (619) 290-8226 or 988 in the [place]. This is open 24 hours a day in the [place] Text the Crisis Text Line at (812) 365-6572 (in the [place].). Summary Opioid medicines can help you manage moderate to severe pain for a short period of time. A pain treatment plan is an agreement between you and your health care provider. Discuss the goals of your treatment, including how much pain you might expect to have and how you will manage the pain. If you think that you or someone else may have taken too much of an opioid, get medical help right away. This information is not intended to replace advice given to you by your health care provider. Make sure you discuss any questions you have with your health care provider. Document Revised: 11/17/2020 Document Reviewed: 08/04/2020 Elsevier Patient Education  [place].  Advanced directives: Yes   Conditions/risks identified: None  Next appointment: Follow up in one year for your annual wellness visit    Preventive Care 65 Years and Older, Female Preventive care refers to lifestyle choices and visits with your health care provider that can promote health and wellness. What does preventive care include? A yearly physical exam. This is also called an annual well check. Dental exams once or twice a year. Routine eye exams. Ask your health care provider how often you should have your eyes checked. Personal lifestyle choices, including: Daily care of your teeth and gums. Regular physical activity. Eating a healthy diet. Avoiding tobacco and drug use. Limiting alcohol use. Practicing safe sex. Taking low-dose aspirin every day. Taking vitamin and mineral supplements as  recommended by your health care provider. What happens during an annual well check? The services and screenings done by your health care provider during your annual well check will depend on your age, overall health, lifestyle risk factors, and family history of disease. Counseling  Your health care provider may ask you questions about your: Alcohol use. Tobacco use. Drug use. Emotional well-being. Home and relationship well-being. Sexual activity. Eating habits. History of falls. Memory and ability to understand (cognition). Work and work Statistician. Reproductive health. Screening  You may have the following tests or measurements: Height, weight, and BMI. Blood pressure. Lipid and cholesterol levels. These may be checked every 5 years, or more frequently if you are over 1 years old. Skin check. Lung cancer screening. You may have this screening  every year starting at age 6 if you have a 30-pack-year history of smoking and currently smoke or have quit within the past 15 years. Fecal occult blood test (FOBT) of the stool. You may have this test every year starting at age 3. Flexible sigmoidoscopy or colonoscopy. You may have a sigmoidoscopy every 5 years or a colonoscopy every 10 years starting at age 67. Hepatitis C blood test. Hepatitis B blood test. Sexually transmitted disease (STD) testing. Diabetes screening. This is done by checking your blood sugar (glucose) after you have not eaten for a while (fasting). You may have this done every 1-3 years. Bone density scan. This is done to screen for osteoporosis. You may have this done starting at age 59. Mammogram. This may be done every 1-2 years. Talk to your health care provider about how often you should have regular mammograms. Talk with your health care provider about your test results, treatment options, and if necessary, the need for more tests. Vaccines  Your health care provider may recommend certain vaccines, such  as: Influenza vaccine. This is recommended every year. Tetanus, diphtheria, and acellular pertussis (Tdap, Td) vaccine. You may need a Td booster every 10 years. Zoster vaccine. You may need this after age 22. Pneumococcal 13-valent conjugate (PCV13) vaccine. One dose is recommended after age 29. Pneumococcal polysaccharide (PPSV23) vaccine. One dose is recommended after age 34. Talk to your health care provider about which screenings and vaccines you need and how often you need them. This information is not intended to replace advice given to you by your health care provider. Make sure you discuss any questions you have with your health care provider. Document Released: 05/21/2015 Document Revised: 01/12/2016 Document Reviewed: 02/23/2015 Elsevier Interactive Patient Education  2017 [place] Prevention in the Home Falls can cause injuries. They can happen to people of all ages. There are many things you can do to make your home safe and to help prevent falls. What can I do on the outside of my home? Regularly fix the edges of walkways and driveways and fix any cracks. Remove anything that might make you trip as you walk through a door, such as a raised step or threshold. Trim any bushes or trees on the path to your home. Use bright outdoor lighting. Clear any walking paths of anything that might make someone trip, such as rocks or tools. Regularly check to see if handrails are loose or broken. Make sure that both sides of any steps have handrails. Any raised decks and porches should have guardrails on the edges. Have any leaves, snow, or ice cleared regularly. Use sand or salt on walking paths during winter. Clean up any spills in your garage right away. This includes oil or grease spills. What can I do in the bathroom? Use night lights. Install grab bars by the toilet and in the tub and shower. Do not use towel bars as grab bars. Use non-skid mats or decals in the tub or  shower. If you need to sit down in the shower, use a plastic, non-slip stool. Keep the floor dry. Clean up any water that spills on the floor as soon as it happens. Remove soap buildup in the tub or shower regularly. Attach bath mats securely with double-sided non-slip rug tape. Do not have throw rugs and other things on the floor that can make you trip. What can I do in the bedroom? Use night lights. Make sure that you have a light by your bed  that is easy to reach. Do not use any sheets or blankets that are too big for your bed. They should not hang down onto the floor. Have a firm chair that has side arms. You can use this for support while you get dressed. Do not have throw rugs and other things on the floor that can make you trip. What can I do in the kitchen? Clean up any spills right away. Avoid walking on wet floors. Keep items that you use a lot in easy-to-reach places. If you need to reach something above you, use a strong step stool that has a grab bar. Keep electrical cords out of the way. Do not use floor polish or wax that makes floors slippery. If you must use wax, use non-skid floor wax. Do not have throw rugs and other things on the floor that can make you trip. What can I do with my stairs? Do not leave any items on the stairs. Make sure that there are handrails on both sides of the stairs and use them. Fix handrails that are broken or loose. Make sure that handrails are as long as the stairways. Check any carpeting to make sure that it is firmly attached to the stairs. Fix any carpet that is loose or worn. Avoid having throw rugs at the top or bottom of the stairs. If you do have throw rugs, attach them to the floor with carpet tape. Make sure that you have a light switch at the top of the stairs and the bottom of the stairs. If you do not have them, ask someone to add them for you. What else can I do to help prevent falls? Wear shoes that: Do not have high heels. Have  rubber bottoms. Are comfortable and fit you well. Are closed at the toe. Do not wear sandals. If you use a stepladder: Make sure that it is fully opened. Do not climb a closed stepladder. Make sure that both sides of the stepladder are locked into place. Ask someone to hold it for you, if possible. Clearly mark and make sure that you can see: Any grab bars or handrails. First and last steps. Where the edge of each step is. Use tools that help you move around (mobility aids) if they are needed. These include: Canes. Walkers. Scooters. Crutches. Turn on the lights when you go into a dark area. Replace any light bulbs as soon as they burn out. Set up your furniture so you have a clear path. Avoid moving your furniture around. If any of your floors are uneven, fix them. If there are any pets around you, be aware of where they are. Review your medicines with your doctor. Some medicines can make you feel dizzy. This can increase your chance of falling. Ask your doctor what other things that you can do to help prevent falls. This information is not intended to replace advice given to you by your health care provider. Make sure you discuss any questions you have with your health care provider. Document Released: 02/18/2009 Document Revised: 09/30/2015 Document Reviewed: 05/29/2014 Elsevier Interactive Patient Education  2017 [employer].

## 2021-07-13 DIAGNOSIS — E785 Hyperlipidemia, unspecified: Secondary | ICD-10-CM | POA: Diagnosis not present

## 2021-07-13 DIAGNOSIS — F0390 Unspecified dementia without behavioral disturbance: Secondary | ICD-10-CM | POA: Diagnosis not present

## 2021-07-13 DIAGNOSIS — I10 Essential (primary) hypertension: Secondary | ICD-10-CM | POA: Diagnosis not present

## 2021-07-14 DIAGNOSIS — F0280 Dementia in other diseases classified elsewhere without behavioral disturbance: Secondary | ICD-10-CM | POA: Diagnosis not present

## 2021-07-14 DIAGNOSIS — F331 Major depressive disorder, recurrent, moderate: Secondary | ICD-10-CM | POA: Diagnosis not present

## 2021-07-14 DIAGNOSIS — F0390 Unspecified dementia without behavioral disturbance: Secondary | ICD-10-CM | POA: Diagnosis not present

## 2021-07-15 DIAGNOSIS — F331 Major depressive disorder, recurrent, moderate: Secondary | ICD-10-CM | POA: Diagnosis not present

## 2021-08-01 DIAGNOSIS — E559 Vitamin D deficiency, unspecified: Secondary | ICD-10-CM | POA: Diagnosis not present

## 2021-08-01 DIAGNOSIS — F331 Major depressive disorder, recurrent, moderate: Secondary | ICD-10-CM | POA: Diagnosis not present

## 2021-08-01 DIAGNOSIS — E785 Hyperlipidemia, unspecified: Secondary | ICD-10-CM | POA: Diagnosis not present

## 2021-08-01 DIAGNOSIS — I10 Essential (primary) hypertension: Secondary | ICD-10-CM | POA: Diagnosis not present

## 2021-08-01 DIAGNOSIS — F0280 Dementia in other diseases classified elsewhere without behavioral disturbance: Secondary | ICD-10-CM | POA: Diagnosis not present

## 2021-08-01 DIAGNOSIS — F0390 Unspecified dementia without behavioral disturbance: Secondary | ICD-10-CM | POA: Diagnosis not present

## 2021-08-02 DIAGNOSIS — F339 Major depressive disorder, recurrent, unspecified: Secondary | ICD-10-CM | POA: Diagnosis not present

## 2021-08-02 DIAGNOSIS — I10 Essential (primary) hypertension: Secondary | ICD-10-CM | POA: Diagnosis not present

## 2021-08-04 DIAGNOSIS — E559 Vitamin D deficiency, unspecified: Secondary | ICD-10-CM | POA: Diagnosis not present

## 2021-08-04 DIAGNOSIS — E119 Type 2 diabetes mellitus without complications: Secondary | ICD-10-CM | POA: Diagnosis not present

## 2021-08-04 DIAGNOSIS — I10 Essential (primary) hypertension: Secondary | ICD-10-CM | POA: Diagnosis not present

## 2021-08-04 DIAGNOSIS — E039 Hypothyroidism, unspecified: Secondary | ICD-10-CM | POA: Diagnosis not present

## 2021-08-04 DIAGNOSIS — E782 Mixed hyperlipidemia: Secondary | ICD-10-CM | POA: Diagnosis not present

## 2021-08-11 ENCOUNTER — Ambulatory Visit (INDEPENDENT_AMBULATORY_CARE_PROVIDER_SITE_OTHER): Payer: Medicare Other

## 2021-08-11 DIAGNOSIS — F03B3 Unspecified dementia, moderate, with mood disturbance: Secondary | ICD-10-CM | POA: Diagnosis not present

## 2021-08-11 DIAGNOSIS — F331 Major depressive disorder, recurrent, moderate: Secondary | ICD-10-CM | POA: Diagnosis not present

## 2021-08-11 DIAGNOSIS — I442 Atrioventricular block, complete: Secondary | ICD-10-CM | POA: Diagnosis not present

## 2021-08-12 LAB — CUP PACEART REMOTE DEVICE CHECK
Battery Impedance: 1257 Ohm
Battery Remaining Longevity: 49 mo
Battery Voltage: 2.77 V
Brady Statistic AP VP Percent: 1 %
Brady Statistic AP VS Percent: 0 %
Brady Statistic AS VP Percent: 99 %
Brady Statistic AS VS Percent: 1 %
Date Time Interrogation Session: 20230406161153
Implantable Lead Implant Date: 20150120
Implantable Lead Implant Date: 20150120
Implantable Lead Location: 753859
Implantable Lead Location: 753860
Implantable Lead Model: 5076
Implantable Lead Model: 5076
Implantable Pulse Generator Implant Date: 20150120
Lead Channel Impedance Value: 508 Ohm
Lead Channel Impedance Value: 517 Ohm
Lead Channel Pacing Threshold Amplitude: 0.5 V
Lead Channel Pacing Threshold Amplitude: 1.25 V
Lead Channel Pacing Threshold Pulse Width: 0.4 ms
Lead Channel Pacing Threshold Pulse Width: 0.4 ms
Lead Channel Setting Pacing Amplitude: 2 V
Lead Channel Setting Pacing Amplitude: 2.5 V
Lead Channel Setting Pacing Pulse Width: 0.4 ms
Lead Channel Setting Sensing Sensitivity: 2.8 mV

## 2021-08-22 DIAGNOSIS — F331 Major depressive disorder, recurrent, moderate: Secondary | ICD-10-CM | POA: Diagnosis not present

## 2021-08-26 NOTE — Progress Notes (Signed)
Remote pacemaker transmission.   

## 2021-09-08 DIAGNOSIS — F03B3 Unspecified dementia, moderate, with mood disturbance: Secondary | ICD-10-CM | POA: Diagnosis not present

## 2021-09-08 DIAGNOSIS — F331 Major depressive disorder, recurrent, moderate: Secondary | ICD-10-CM | POA: Diagnosis not present

## 2021-09-12 DIAGNOSIS — E785 Hyperlipidemia, unspecified: Secondary | ICD-10-CM | POA: Diagnosis not present

## 2021-09-12 DIAGNOSIS — F03B3 Unspecified dementia, moderate, with mood disturbance: Secondary | ICD-10-CM | POA: Diagnosis not present

## 2021-09-12 DIAGNOSIS — E559 Vitamin D deficiency, unspecified: Secondary | ICD-10-CM | POA: Diagnosis not present

## 2021-09-12 DIAGNOSIS — I10 Essential (primary) hypertension: Secondary | ICD-10-CM | POA: Diagnosis not present

## 2021-09-14 DIAGNOSIS — M79674 Pain in right toe(s): Secondary | ICD-10-CM | POA: Diagnosis not present

## 2021-09-14 DIAGNOSIS — F0390 Unspecified dementia without behavioral disturbance: Secondary | ICD-10-CM | POA: Diagnosis not present

## 2021-09-14 DIAGNOSIS — F331 Major depressive disorder, recurrent, moderate: Secondary | ICD-10-CM | POA: Diagnosis not present

## 2021-09-14 DIAGNOSIS — B351 Tinea unguium: Secondary | ICD-10-CM | POA: Diagnosis not present

## 2021-09-14 DIAGNOSIS — R269 Unspecified abnormalities of gait and mobility: Secondary | ICD-10-CM | POA: Diagnosis not present

## 2021-09-14 DIAGNOSIS — M79675 Pain in left toe(s): Secondary | ICD-10-CM | POA: Diagnosis not present

## 2021-09-14 DIAGNOSIS — L603 Nail dystrophy: Secondary | ICD-10-CM | POA: Diagnosis not present

## 2021-10-05 DIAGNOSIS — I10 Essential (primary) hypertension: Secondary | ICD-10-CM | POA: Diagnosis not present

## 2021-10-05 DIAGNOSIS — F339 Major depressive disorder, recurrent, unspecified: Secondary | ICD-10-CM | POA: Diagnosis not present

## 2021-10-06 DIAGNOSIS — F331 Major depressive disorder, recurrent, moderate: Secondary | ICD-10-CM | POA: Diagnosis not present

## 2021-10-06 DIAGNOSIS — F0280 Dementia in other diseases classified elsewhere without behavioral disturbance: Secondary | ICD-10-CM | POA: Diagnosis not present

## 2021-10-06 DIAGNOSIS — F03B3 Unspecified dementia, moderate, with mood disturbance: Secondary | ICD-10-CM | POA: Diagnosis not present

## 2021-10-17 DIAGNOSIS — E785 Hyperlipidemia, unspecified: Secondary | ICD-10-CM | POA: Diagnosis not present

## 2021-10-17 DIAGNOSIS — I10 Essential (primary) hypertension: Secondary | ICD-10-CM | POA: Diagnosis not present

## 2021-10-17 DIAGNOSIS — F0280 Dementia in other diseases classified elsewhere without behavioral disturbance: Secondary | ICD-10-CM | POA: Diagnosis not present

## 2021-10-17 DIAGNOSIS — E559 Vitamin D deficiency, unspecified: Secondary | ICD-10-CM | POA: Diagnosis not present

## 2021-11-01 DIAGNOSIS — M1991 Primary osteoarthritis, unspecified site: Secondary | ICD-10-CM | POA: Diagnosis not present

## 2021-11-01 DIAGNOSIS — F339 Major depressive disorder, recurrent, unspecified: Secondary | ICD-10-CM | POA: Diagnosis not present

## 2021-11-03 DIAGNOSIS — F03B3 Unspecified dementia, moderate, with mood disturbance: Secondary | ICD-10-CM | POA: Diagnosis not present

## 2021-11-03 DIAGNOSIS — F0280 Dementia in other diseases classified elsewhere without behavioral disturbance: Secondary | ICD-10-CM | POA: Diagnosis not present

## 2021-11-03 DIAGNOSIS — F331 Major depressive disorder, recurrent, moderate: Secondary | ICD-10-CM | POA: Diagnosis not present

## 2021-11-10 ENCOUNTER — Ambulatory Visit (INDEPENDENT_AMBULATORY_CARE_PROVIDER_SITE_OTHER): Payer: Medicare Other

## 2021-11-10 DIAGNOSIS — I442 Atrioventricular block, complete: Secondary | ICD-10-CM | POA: Diagnosis not present

## 2021-11-10 LAB — CUP PACEART REMOTE DEVICE CHECK
Battery Impedance: 1419 Ohm
Battery Remaining Longevity: 44 mo
Battery Voltage: 2.77 V
Brady Statistic AP VP Percent: 1 %
Brady Statistic AP VS Percent: 0 %
Brady Statistic AS VP Percent: 99 %
Brady Statistic AS VS Percent: 1 %
Date Time Interrogation Session: 20230706125113
Implantable Lead Implant Date: 20150120
Implantable Lead Implant Date: 20150120
Implantable Lead Location: 753859
Implantable Lead Location: 753860
Implantable Lead Model: 5076
Implantable Lead Model: 5076
Implantable Pulse Generator Implant Date: 20150120
Lead Channel Impedance Value: 503 Ohm
Lead Channel Impedance Value: 504 Ohm
Lead Channel Pacing Threshold Amplitude: 0.5 V
Lead Channel Pacing Threshold Amplitude: 1.25 V
Lead Channel Pacing Threshold Pulse Width: 0.4 ms
Lead Channel Pacing Threshold Pulse Width: 0.4 ms
Lead Channel Setting Pacing Amplitude: 2 V
Lead Channel Setting Pacing Amplitude: 2.5 V
Lead Channel Setting Pacing Pulse Width: 0.4 ms
Lead Channel Setting Sensing Sensitivity: 2.8 mV

## 2021-11-14 DIAGNOSIS — M1991 Primary osteoarthritis, unspecified site: Secondary | ICD-10-CM | POA: Diagnosis not present

## 2021-11-14 DIAGNOSIS — F0280 Dementia in other diseases classified elsewhere without behavioral disturbance: Secondary | ICD-10-CM | POA: Diagnosis not present

## 2021-11-14 DIAGNOSIS — I10 Essential (primary) hypertension: Secondary | ICD-10-CM | POA: Diagnosis not present

## 2021-11-28 NOTE — Progress Notes (Signed)
Remote pacemaker transmission.   

## 2021-11-30 DIAGNOSIS — M1991 Primary osteoarthritis, unspecified site: Secondary | ICD-10-CM | POA: Diagnosis not present

## 2021-11-30 DIAGNOSIS — F339 Major depressive disorder, recurrent, unspecified: Secondary | ICD-10-CM | POA: Diagnosis not present

## 2021-12-01 DIAGNOSIS — F0280 Dementia in other diseases classified elsewhere without behavioral disturbance: Secondary | ICD-10-CM | POA: Diagnosis not present

## 2021-12-01 DIAGNOSIS — F331 Major depressive disorder, recurrent, moderate: Secondary | ICD-10-CM | POA: Diagnosis not present

## 2021-12-01 DIAGNOSIS — F01B0 Vascular dementia, moderate, without behavioral disturbance, psychotic disturbance, mood disturbance, and anxiety: Secondary | ICD-10-CM | POA: Diagnosis not present

## 2021-12-21 DIAGNOSIS — B351 Tinea unguium: Secondary | ICD-10-CM | POA: Diagnosis not present

## 2021-12-21 DIAGNOSIS — L84 Corns and callosities: Secondary | ICD-10-CM | POA: Diagnosis not present

## 2021-12-21 DIAGNOSIS — F0390 Unspecified dementia without behavioral disturbance: Secondary | ICD-10-CM | POA: Diagnosis not present

## 2021-12-21 DIAGNOSIS — M79675 Pain in left toe(s): Secondary | ICD-10-CM | POA: Diagnosis not present

## 2021-12-21 DIAGNOSIS — R2681 Unsteadiness on feet: Secondary | ICD-10-CM | POA: Diagnosis not present

## 2021-12-21 DIAGNOSIS — L603 Nail dystrophy: Secondary | ICD-10-CM | POA: Diagnosis not present

## 2021-12-21 DIAGNOSIS — M79674 Pain in right toe(s): Secondary | ICD-10-CM | POA: Diagnosis not present

## 2021-12-29 DIAGNOSIS — F331 Major depressive disorder, recurrent, moderate: Secondary | ICD-10-CM | POA: Diagnosis not present

## 2021-12-29 DIAGNOSIS — F0280 Dementia in other diseases classified elsewhere without behavioral disturbance: Secondary | ICD-10-CM | POA: Diagnosis not present

## 2021-12-29 DIAGNOSIS — F01B0 Vascular dementia, moderate, without behavioral disturbance, psychotic disturbance, mood disturbance, and anxiety: Secondary | ICD-10-CM | POA: Diagnosis not present

## 2022-01-19 DIAGNOSIS — F01B0 Vascular dementia, moderate, without behavioral disturbance, psychotic disturbance, mood disturbance, and anxiety: Secondary | ICD-10-CM | POA: Diagnosis not present

## 2022-01-19 DIAGNOSIS — E785 Hyperlipidemia, unspecified: Secondary | ICD-10-CM | POA: Diagnosis not present

## 2022-01-19 DIAGNOSIS — E559 Vitamin D deficiency, unspecified: Secondary | ICD-10-CM | POA: Diagnosis not present

## 2022-01-19 DIAGNOSIS — F0280 Dementia in other diseases classified elsewhere without behavioral disturbance: Secondary | ICD-10-CM | POA: Diagnosis not present

## 2022-01-19 DIAGNOSIS — I10 Essential (primary) hypertension: Secondary | ICD-10-CM | POA: Diagnosis not present

## 2022-01-19 DIAGNOSIS — M1991 Primary osteoarthritis, unspecified site: Secondary | ICD-10-CM | POA: Diagnosis not present

## 2022-01-24 DIAGNOSIS — F331 Major depressive disorder, recurrent, moderate: Secondary | ICD-10-CM | POA: Diagnosis not present

## 2022-01-24 DIAGNOSIS — F01B0 Vascular dementia, moderate, without behavioral disturbance, psychotic disturbance, mood disturbance, and anxiety: Secondary | ICD-10-CM | POA: Diagnosis not present

## 2022-01-24 DIAGNOSIS — F0280 Dementia in other diseases classified elsewhere without behavioral disturbance: Secondary | ICD-10-CM | POA: Diagnosis not present

## 2022-02-09 DIAGNOSIS — Z723 Lack of physical exercise: Secondary | ICD-10-CM | POA: Diagnosis not present

## 2022-02-09 DIAGNOSIS — M1991 Primary osteoarthritis, unspecified site: Secondary | ICD-10-CM | POA: Diagnosis not present

## 2022-02-09 DIAGNOSIS — I10 Essential (primary) hypertension: Secondary | ICD-10-CM | POA: Diagnosis not present

## 2022-02-09 DIAGNOSIS — E559 Vitamin D deficiency, unspecified: Secondary | ICD-10-CM | POA: Diagnosis not present

## 2022-02-09 DIAGNOSIS — F331 Major depressive disorder, recurrent, moderate: Secondary | ICD-10-CM | POA: Diagnosis not present

## 2022-02-13 DIAGNOSIS — E559 Vitamin D deficiency, unspecified: Secondary | ICD-10-CM | POA: Diagnosis not present

## 2022-02-13 DIAGNOSIS — M1991 Primary osteoarthritis, unspecified site: Secondary | ICD-10-CM | POA: Diagnosis not present

## 2022-02-13 DIAGNOSIS — F01B0 Vascular dementia, moderate, without behavioral disturbance, psychotic disturbance, mood disturbance, and anxiety: Secondary | ICD-10-CM | POA: Diagnosis not present

## 2022-02-13 DIAGNOSIS — I10 Essential (primary) hypertension: Secondary | ICD-10-CM | POA: Diagnosis not present

## 2022-02-13 DIAGNOSIS — E785 Hyperlipidemia, unspecified: Secondary | ICD-10-CM | POA: Diagnosis not present

## 2022-02-16 ENCOUNTER — Ambulatory Visit (INDEPENDENT_AMBULATORY_CARE_PROVIDER_SITE_OTHER): Payer: Medicare Other

## 2022-02-16 DIAGNOSIS — I442 Atrioventricular block, complete: Secondary | ICD-10-CM | POA: Diagnosis not present

## 2022-02-16 LAB — CUP PACEART REMOTE DEVICE CHECK
Battery Impedance: 1529 Ohm
Battery Remaining Longevity: 42 mo
Battery Voltage: 2.76 V
Brady Statistic AP VP Percent: 1 %
Brady Statistic AP VS Percent: 0 %
Brady Statistic AS VP Percent: 99 %
Brady Statistic AS VS Percent: 1 %
Date Time Interrogation Session: 20231012122720
Implantable Lead Implant Date: 20150120
Implantable Lead Implant Date: 20150120
Implantable Lead Location: 753859
Implantable Lead Location: 753860
Implantable Lead Model: 5076
Implantable Lead Model: 5076
Implantable Pulse Generator Implant Date: 20150120
Lead Channel Impedance Value: 490 Ohm
Lead Channel Impedance Value: 511 Ohm
Lead Channel Pacing Threshold Amplitude: 0.5 V
Lead Channel Pacing Threshold Amplitude: 1.125 V
Lead Channel Pacing Threshold Pulse Width: 0.4 ms
Lead Channel Pacing Threshold Pulse Width: 0.4 ms
Lead Channel Setting Pacing Amplitude: 2 V
Lead Channel Setting Pacing Amplitude: 2.5 V
Lead Channel Setting Pacing Pulse Width: 0.4 ms
Lead Channel Setting Sensing Sensitivity: 2.8 mV

## 2022-02-22 DIAGNOSIS — M1991 Primary osteoarthritis, unspecified site: Secondary | ICD-10-CM | POA: Diagnosis not present

## 2022-02-22 DIAGNOSIS — F339 Major depressive disorder, recurrent, unspecified: Secondary | ICD-10-CM | POA: Diagnosis not present

## 2022-02-23 DIAGNOSIS — F01B0 Vascular dementia, moderate, without behavioral disturbance, psychotic disturbance, mood disturbance, and anxiety: Secondary | ICD-10-CM | POA: Diagnosis not present

## 2022-02-23 DIAGNOSIS — F331 Major depressive disorder, recurrent, moderate: Secondary | ICD-10-CM | POA: Diagnosis not present

## 2022-02-23 NOTE — Progress Notes (Signed)
Remote pacemaker transmission.   

## 2022-03-09 DIAGNOSIS — Z23 Encounter for immunization: Secondary | ICD-10-CM | POA: Diagnosis not present

## 2022-03-13 ENCOUNTER — Ambulatory Visit (INDEPENDENT_AMBULATORY_CARE_PROVIDER_SITE_OTHER): Payer: Medicare Other | Admitting: Family Medicine

## 2022-03-13 ENCOUNTER — Encounter: Payer: Self-pay | Admitting: Family Medicine

## 2022-03-13 VITALS — BP 130/76 | HR 85 | Temp 97.4°F | Ht 64.0 in | Wt 147.2 lb

## 2022-03-13 DIAGNOSIS — H9193 Unspecified hearing loss, bilateral: Secondary | ICD-10-CM | POA: Diagnosis not present

## 2022-03-13 DIAGNOSIS — I10 Essential (primary) hypertension: Secondary | ICD-10-CM

## 2022-03-13 DIAGNOSIS — I340 Nonrheumatic mitral (valve) insufficiency: Secondary | ICD-10-CM | POA: Diagnosis not present

## 2022-03-13 DIAGNOSIS — F339 Major depressive disorder, recurrent, unspecified: Secondary | ICD-10-CM

## 2022-03-13 NOTE — Progress Notes (Signed)
Established Patient Office Visit  Subjective   Patient ID: Theresa Norton, female    DOB: [birth year]  Age: 88 y.o. MRN: [id number]  Chief Complaint  Patient presents with   Follow-up    HPI   Theresa Norton is 88 years old and is here basically to reestablish care.  She was in nursing home called [facility] until couple weeks ago.  She has been discharged home to live with her daughter.  She is accompanied today by her son.  Her chronic problems include history of hypertension, complete heart block, GERD, obesity, hyponatremia, cognitive impairment, past history of alcohol abuse.  She has pacemaker in place.  Her current medication include amlodipine 5 mg daily, Aricept 5 mg daily, sertraline 50 mg daily, and Lasix 20 mg daily as needed.  No recent reported peripheral edema.  Sertraline was started reportedly for depression issues.  Sound like she had mitral murmur in the past.  She had echo 2016 which showed systolic anterior motion of mitral valve with LVOT gradient of 2 at that time.  She is very sedentary.  No reported dyspnea at rest.  No chest pain.  He is very hard of hearing.  Has not had hearing test in some time.  Currently without hearing aids.  Patient has declined flu vaccines in the past  Past Medical History:  Diagnosis Date   Allergy    Anemia    Arthritis    "in my fingers" (07/08/2013)   CAD (coronary artery disease)    a. 05/2013 nonobs dzs by cath.   Colon polyps    Groin hematoma    a. 05/2013 R groin hematoma post-cath - u/s 05/31/13 large 7.49 cm r inguinal hematoma extending into thigh, no psa or avf.   H/O hiatal hernia    Heart block AV second degree    a. s/p MDT Addapta dual chamber pacemaker 05/2013 by Dr Rayann Heman.   Hypertension    On home oxygen therapy    "2L maybe 12h/day" (07/08/2013)   Pacemaker    Pacemaker    PAF (paroxysmal atrial fibrillation) ([place])    a. Found post-pacer 05/2013->Xarelto added;  b. 05/2013 Echo: EF 60-65%, mild LVH, nl wall  motion w/o rwma.   Past Surgical History:  Procedure Laterality Date   BREAST CYST EXCISION Right 1959; 1970's   BUNIONECTOMY Bilateral 1967   CARDIAC CATHETERIZATION     CATARACT EXTRACTION W/ INTRAOCULAR LENS  IMPLANT, BILATERAL Bilateral    DILATION AND CURETTAGE OF UTERUS     INSERT / REPLACE / REMOVE PACEMAKER  05/2013   MDT [id number] implanted by Dr Rayann Heman for heart block   INSERT / REPLACE / REMOVE PACEMAKER  06/2013   Right ventricular lead perforation with tamponade   LEAD REVISION N/A 06/13/2013   Procedure: LEAD REVISION;  Surgeon: Coralyn Mark, MD;  Location: [facility];  Service: Cardiovascular;  Laterality: N/A;   LEFT HEART CATHETERIZATION WITH CORONARY ANGIOGRAM N/A 05/27/2013   Procedure: LEFT HEART CATHETERIZATION WITH CORONARY ANGIOGRAM;  Surgeon: Birdie Riddle, MD;  Location: [facility];  Service: Cardiovascular;  Laterality: N/A;   PERICARDIAL TAP N/A 06/11/2013   Procedure: PERICARDIAL TAP;  Surgeon: Birdie Riddle, MD;  Location: [facility]ATH [facility];  Service: Cardiovascular;  Laterality: N/A;   PERMANENT PACEMAKER INSERTION N/A 05/27/2013   Procedure: PERMANENT PACEMAKER INSERTION;  Surgeon: Coralyn Mark, MD;  Location: [facility] CATH LAB;  Service: Cardiovascular;  Laterality: N/A;   TEMPORARY PACEMAKER INSERTION N/A 05/27/2013  Procedure: TEMPORARY PACEMAKER INSERTION;  Surgeon: Birdie Riddle, MD;  Location: [facility] CATH LAB;  Service: Cardiovascular;  Laterality: N/A;   VARICOSE VEIN SURGERY Bilateral ~ 2002    reports that she has never smoked. She has never used smokeless tobacco. She reports current alcohol use of about 14.0 standard drinks of alcohol per week. She reports that she does not use drugs. family history includes Cancer in her father. Allergies  Allergen Reactions   Codeine     GI upset   Morphine And Related Other (See Comments)    GI upset GI upset   Other Other (See Comments)    Pain killers, unknown,     Review of Systems  Constitutional:  Negative for  chills and fever.  Respiratory:  Negative for cough and shortness of breath.   Cardiovascular:  Negative for chest pain.  Genitourinary:  Negative for dysuria.      Objective:     BP 130/76 (BP Location: Left Arm, Patient Position: Sitting, Cuff Size: Normal)   Pulse 85   Temp (!) 97.4 F (36.3 C) (Oral)   Ht '5\' 4"'$  (1.626 m)   Wt 147 lb 3.2 oz (66.8 kg)   SpO2 96%   BMI 25.27 kg/m  BP Readings from Last 3 Encounters:  03/13/22 130/76  11/29/20 122/64  11/22/20 134/80   Wt Readings from Last 3 Encounters:  03/13/22 147 lb 3.2 oz (66.8 kg)  07/12/21 161 lb (73 kg)  11/29/20 160 lb 14.4 oz (73 kg)      Physical Exam Vitals reviewed.  Constitutional:      Appearance: She is not ill-appearing.  Cardiovascular:     Rate and Rhythm: Normal rate.     Heart sounds: Murmur heard.     Comments: She has 2/6 to 3/6 systolic mitral murmur heard axillary region. Pulmonary:     Effort: Pulmonary effort is normal.     Breath sounds: Normal breath sounds. No wheezing or rales.  Musculoskeletal:     Right lower leg: No edema.     Left lower leg: No edema.  Neurological:     Mental Status: She is alert.      No results found for any visits on 03/13/22.    The ASCVD Risk score (Arnett DK, et al., 2019) failed to calculate for the following reasons:   The 2019 ASCVD risk score is only valid for ages 53 to 75    Assessment & Plan:   #1 hypertension stable and well-controlled on amlodipine 5 mg daily.  Continue current medication.  They will call in refills are needed.  #2 history of recurrent depression currently stable on sertraline 50 mg daily.  Continue current medication  #3 mitral murmur.  Past history of mitral regurg which was relatively mild with systolic anterior motion of mitral valve.  She is asymptomatic at rest.  Family not interested in pursuing further evaluation at this time  #4 chronic bilateral hearing loss.  We suggested they set up repeat audiology  assessment.  Return in about 6 months (around 09/11/2022).    Carolann Littler, MD

## 2022-03-25 ENCOUNTER — Encounter: Payer: Self-pay | Admitting: Family Medicine

## 2022-03-28 ENCOUNTER — Other Ambulatory Visit: Payer: Self-pay

## 2022-03-28 DIAGNOSIS — R4189 Other symptoms and signs involving cognitive functions and awareness: Secondary | ICD-10-CM

## 2022-03-28 DIAGNOSIS — I10 Essential (primary) hypertension: Secondary | ICD-10-CM

## 2022-03-28 MED ORDER — FUROSEMIDE 20 MG PO TABS: ORAL_TABLET | ORAL | 1 refills | Status: DC

## 2022-03-28 MED ORDER — DONEPEZIL HCL 5 MG PO TABS: 5.0000 mg | ORAL_TABLET | Freq: Every day | ORAL | 1 refills | Status: DC

## 2022-03-28 MED ORDER — SERTRALINE HCL 50 MG PO TABS: 50.0000 mg | ORAL_TABLET | Freq: Every day | ORAL | 1 refills | Status: DC

## 2022-03-28 MED ORDER — AMLODIPINE BESYLATE 5 MG PO TABS: 5.0000 mg | ORAL_TABLET | Freq: Every day | ORAL | 1 refills | Status: DC

## 2022-05-18 ENCOUNTER — Ambulatory Visit (INDEPENDENT_AMBULATORY_CARE_PROVIDER_SITE_OTHER): Payer: Medicare Other

## 2022-05-18 DIAGNOSIS — I442 Atrioventricular block, complete: Secondary | ICD-10-CM | POA: Diagnosis not present

## 2022-05-19 LAB — CUP PACEART REMOTE DEVICE CHECK
Battery Impedance: 1611 Ohm
Battery Remaining Longevity: 40 mo
Battery Voltage: 2.76 V
Brady Statistic AP VP Percent: 1 %
Brady Statistic AP VS Percent: 0 %
Brady Statistic AS VP Percent: 99 %
Brady Statistic AS VS Percent: 1 %
Date Time Interrogation Session: 20240111195649
Implantable Lead Connection Status: 753985
Implantable Lead Connection Status: 753985
Implantable Lead Implant Date: 20150120
Implantable Lead Implant Date: 20150120
Implantable Lead Location: 753859
Implantable Lead Location: 753860
Implantable Lead Model: 5076
Implantable Lead Model: 5076
Implantable Pulse Generator Implant Date: 20150120
Lead Channel Impedance Value: 521 Ohm
Lead Channel Impedance Value: 542 Ohm
Lead Channel Pacing Threshold Amplitude: 0.5 V
Lead Channel Pacing Threshold Amplitude: 1 V
Lead Channel Pacing Threshold Pulse Width: 0.4 ms
Lead Channel Pacing Threshold Pulse Width: 0.4 ms
Lead Channel Setting Pacing Amplitude: 2 V
Lead Channel Setting Pacing Amplitude: 2.5 V
Lead Channel Setting Pacing Pulse Width: 0.4 ms
Lead Channel Setting Sensing Sensitivity: 2.8 mV
Zone Setting Status: 755011
Zone Setting Status: 755011

## 2022-05-29 ENCOUNTER — Encounter: Payer: Self-pay | Admitting: Family Medicine

## 2022-05-29 ENCOUNTER — Ambulatory Visit (INDEPENDENT_AMBULATORY_CARE_PROVIDER_SITE_OTHER): Payer: Medicare Other | Admitting: Family Medicine

## 2022-05-29 VITALS — BP 126/82 | HR 89 | Temp 98.1°F | Wt 149.4 lb

## 2022-05-29 DIAGNOSIS — H6123 Impacted cerumen, bilateral: Secondary | ICD-10-CM

## 2022-05-29 DIAGNOSIS — H9193 Unspecified hearing loss, bilateral: Secondary | ICD-10-CM

## 2022-05-29 NOTE — Progress Notes (Signed)
   Subjective:    Patient ID: Theresa Norton, female    DOB: 07-14-1928, 89 y.o.   MRN: [id number]  HPI Here with her daughter to have her ears cleaned out. She has never had this done before. She was to have her hearing checked at the audiologist's [facility], but they told her to see [place] first. She denies any ear pain.    Review of Systems  Constitutional: Negative.   HENT:  Positive for hearing loss. Negative for congestion, ear pain and sinus pressure.   Respiratory: Negative.    Cardiovascular: Negative.        Objective:   Physical Exam Constitutional:      Appearance: Normal appearance.  HENT:     Right Ear: There is impacted cerumen.     Left Ear: There is impacted cerumen.  Cardiovascular:     Rate and Rhythm: Normal rate and regular rhythm.     Pulses: Normal pulses.     Heart sounds: Normal heart sounds.  Neurological:     Mental Status: She is alert.           Assessment & Plan:  Impacted cerumen. After informed consent was obtained, both ear canals were irrigated clear with water. She tolerated the procedure well. Afterwards the canals were clear on re-exam, and she said her hearing was better. She will now return for her hearing test. Alysia Penna, MD

## 2022-06-07 NOTE — Progress Notes (Signed)
Remote pacemaker transmission.   

## 2022-07-14 ENCOUNTER — Ambulatory Visit (INDEPENDENT_AMBULATORY_CARE_PROVIDER_SITE_OTHER): Payer: Medicare Other

## 2022-07-14 VITALS — Ht 60.0 in | Wt 149.0 lb

## 2022-07-14 DIAGNOSIS — Z0000 Encounter for general adult medical examination without abnormal findings: Secondary | ICD-10-CM

## 2022-07-14 NOTE — Progress Notes (Signed)
Subjective:   Theresa Norton is a 89 y.o. female who presents for Medicare Annual (Subsequent) preventive examination.  Review of Systems    Virtual Visit via Telephone Note  I connected with  Theresa Norton on 07/14/22 at  1:00 PM EST by telephone and verified that I am speaking with the correct person using two identifiers.  Location: Patient: Home Provider: Office Persons participating in the virtual visit: patient/Nurse Health Advisor   I discussed the limitations, risks, security and privacy concerns of performing an evaluation and management service by telephone and the availability of in person appointments. The patient expressed understanding and agreed to proceed.  Interactive audio and video telecommunications were attempted between this nurse and patient, however failed, due to patient having technical difficulties OR patient did not have access to video capability.  We continued and completed visit with audio only.  Some vital signs may be absent or patient reported.   Criselda Peaches, LPN  Cardiac Risk Factors include: advanced age (>74mn, >>78women);hypertension     Objective:    Today's Vitals   07/14/22 1302  Weight: 149 lb (67.6 kg)  Height: 5' (1.524 m)   Body mass index is 29.1 kg/m.     07/14/2022    1:14 PM 07/12/2021    1:46 PM 10/11/2020    1:57 PM 06/30/2020   11:23 AM 06/11/2019    3:43 PM 02/13/2018   11:40 AM 01/23/2017    3:30 PM  Advanced Directives  Does Patient Have a Medical Advance Directive? Yes Yes Unable to assess, patient is non-responsive or altered mental status Yes Yes Yes Yes  Type of Advance Directive [place] will [place] will  Healthcare Power of [place] will    Does patient want to make changes to medical advance directive?  No - Patient declined   No - Patient declined    Copy of [place] Chart? No - copy  requested No - copy requested  No - copy requested No - copy requested      Current Medications (verified) Outpatient Encounter Medications as of 07/14/2022  Medication Sig   Acetaminophen (TYLENOL 8 HOUR PO) Take by mouth.   amLODipine (NORVASC) 5 MG tablet Take 1 tablet (5 mg total) by mouth daily.   donepezil (ARICEPT) 5 MG tablet Take 1 tablet (5 mg total) by mouth daily.   furosemide (LASIX) 20 MG tablet TAKE 1 TABLET BY MOUTH DAILY AS NEEDED FOR LEG SWELLING   loratadine (CLARITIN) 10 MG tablet Take 10 mg by mouth daily.   Multiple Vitamin (MULTIVITAMIN ADULT PO) Take 1 tablet by mouth daily. (Patient not taking: Reported on 05/29/2022)   sertraline (ZOLOFT) 50 MG tablet Take 1 tablet (50 mg total) by mouth daily.   TURMERIC PO Take 1 capsule by mouth daily.   No facility-administered encounter medications on file as of 07/14/2022.    Allergies (verified) Codeine, Morphine and related, and Other   History: Past Medical History:  Diagnosis Date   Allergy    Anemia    Arthritis    "in my fingers" (07/08/2013)   CAD (coronary artery disease)    a. 05/2013 nonobs dzs by cath.   Colon polyps    Groin hematoma    a. 05/2013 R groin hematoma post-cath - u/s 05/31/13 large 7.49 cm r inguinal hematoma extending into thigh, no psa or avf.   H/O hiatal hernia  Heart block AV second degree    a. s/p MDT Addapta dual chamber pacemaker 05/2013 by Dr Rayann Heman.   Hypertension    On home oxygen therapy    "2L maybe 12h/day" (07/08/2013)   Pacemaker    Pacemaker    PAF (paroxysmal atrial fibrillation) ([place])    a. Found post-pacer 05/2013->Xarelto added;  b. 05/2013 Echo: EF 60-65%, mild LVH, nl wall motion w/o rwma.   Past Surgical History:  Procedure Laterality Date   BREAST CYST EXCISION Right 1959; 1970's   BUNIONECTOMY Bilateral 1967   CARDIAC CATHETERIZATION     CATARACT EXTRACTION W/ INTRAOCULAR LENS  IMPLANT, BILATERAL Bilateral    DILATION AND CURETTAGE OF UTERUS     INSERT / REPLACE  / REMOVE PACEMAKER  05/2013   MDT [id number] implanted by Dr Rayann Heman for heart block   INSERT / REPLACE / REMOVE PACEMAKER  06/2013   Right ventricular lead perforation with tamponade   LEAD REVISION N/A 06/13/2013   Procedure: LEAD REVISION;  Surgeon: Coralyn Mark, MD;  Location: [facility];  Service: Cardiovascular;  Laterality: N/A;   LEFT HEART CATHETERIZATION WITH CORONARY ANGIOGRAM N/A 05/27/2013   Procedure: LEFT HEART CATHETERIZATION WITH CORONARY ANGIOGRAM;  Surgeon: Birdie Riddle, MD;  Location: [facility];  Service: Cardiovascular;  Laterality: N/A;   PERICARDIAL TAP N/A 06/11/2013   Procedure: PERICARDIAL TAP;  Surgeon: Birdie Riddle, MD;  Location: [facility];  Service: Cardiovascular;  Laterality: N/A;   PERMANENT PACEMAKER INSERTION N/A 05/27/2013   Procedure: PERMANENT PACEMAKER INSERTION;  Surgeon: Coralyn Mark, MD;  Location: [facility];  Service: Cardiovascular;  Laterality: N/A;   TEMPORARY PACEMAKER INSERTION N/A 05/27/2013   Procedure: TEMPORARY PACEMAKER INSERTION;  Surgeon: Birdie Riddle, MD;  Location: [facility];  Service: Cardiovascular;  Laterality: N/A;   VARICOSE VEIN SURGERY Bilateral ~ 2002   Family History  Problem Relation Age of Onset   Cancer Father        lung   Social History   Socioeconomic History   Marital status: Widowed    Spouse name: Not on file   Number of children: Not on file   Years of education: Not on file   Highest education level: Not on file  Occupational History   Not on file  Tobacco Use   Smoking status: Never   Smokeless tobacco: Never  Vaping Use   Vaping Use: Never used  Substance and Sexual Activity   Alcohol use: Yes    Alcohol/week: 14.0 standard drinks of alcohol    Types: 7 Glasses of wine, 7 Cans of beer per week    Comment: 2019: wine in the evening    Drug use: No   Sexual activity: Never  Other Topics Concern   Not on file  Social History Narrative   Living at [place] Strain: Low Risk  (07/14/2022)   Overall Financial Resource Strain (CARDIA)    Difficulty of Paying Living Expenses: Not hard at all  Food Insecurity: No Food Insecurity (07/14/2022)   Hunger Vital Sign    Worried About Running Out of Food in the Last Year: Never true    Ran Out of Food in the Last Year: Never true  Transportation Needs: No Transportation Needs (07/14/2022)   PRAPARE - Hydrologist (Medical): No    Lack of Transportation (Non-Medical): No  Physical Activity: Inactive (07/14/2022)  Exercise Vital Sign    Days of Exercise per Week: 0 days    Minutes of Exercise per Session: 0 min  Stress: No Stress Concern Present (07/14/2022)   [employer]    Feeling of Stress : Not at all  Social Connections: Socially Isolated (07/14/2022)   Social Connection and Isolation Panel [NHANES]    Frequency of Communication with Friends and Family: More than three times a week    Frequency of Social Gatherings with Friends and Family: More than three times a week    Attends Religious Services: Never    Marine scientist or Organizations: No    Attends Archivist Meetings: Never    Marital Status: Widowed    Tobacco Counseling Counseling given: Not Answered   Clinical Intake:  Pre-visit preparation completed: No  Pain : No/denies pain     BMI - recorded: 29.1 Nutritional Status: BMI 25 -29 Overweight Nutritional Risks: None Diabetes: No  How often do you need to have someone help you when you read instructions, pamphlets, or other written materials from your doctor or pharmacy?: 3 - Sometimes (Fammily assist)  Diabetic?  No  Interpreter Needed?: No  Information entered by :: Rolene Arbour LPN   Activities of Daily Living    07/14/2022    1:11 PM  In your present state of health, do you have any difficulty performing the following activities:   Hearing? 1  Vision? 0  Difficulty concentrating or making decisions? 0  Walking or climbing stairs? 0  Dressing or bathing? 0  Doing errands, shopping? 0  Preparing Food and eating ? N  Using the Toilet? N  In the past six months, have you accidently leaked urine? N  Do you have problems with loss of bowel control? N  Managing your Medications? N  Managing your Finances? N  Housekeeping or managing your Housekeeping? N    Patient Care Team: Eulas Post, MD as PCP - General (Family Medicine) [facility] as Registered Nurse  Indicate any recent Medical Services you may have received from other than [facility] providers in the past year (date may be approximate).     Assessment:   This is a routine wellness examination for Theresa Norton.  Hearing/Vision screen Hearing Screening - Comments:: Wears hearing aids Vision Screening - Comments:: Wears rx glasses - up to date with routine eye exams with  Deferred  Dietary issues and exercise activities discussed: Exercise limited by: None identified   Goals Addressed               This Visit's Progress     No current goals (pt-stated)         Depression Screen    07/14/2022    1:10 PM 05/29/2022   11:15 AM 03/13/2022    3:55 PM 07/12/2021    1:34 PM 11/29/2020    3:50 PM 06/30/2020   11:20 AM 06/11/2019    4:10 PM  PHQ 2/9 Scores  PHQ - 2 Score 0 '4 3 1 '$ 0 0 0  PHQ- 9 Score 0 12 5        Fall Risk    07/14/2022    1:13 PM 05/29/2022   11:15 AM 03/13/2022    3:15 PM 07/12/2021    1:44 PM 11/29/2020    3:49 PM  [place] in the past year? 0 '1 1 1 1  '$ Number falls in past yr: 0  0 0 0 0  Injury with Fall? 0 0 '1 1 1  '$ Comment    Back injury. Followed by PCP   Risk for fall due to : No Fall Risks No Fall Risks No Fall Risks Impaired balance/gait History of fall(s)  Follow up Falls prevention discussed Falls evaluation completed Falls evaluation completed  Falls evaluation completed    FALL RISK PREVENTION  PERTAINING TO THE HOME:  Any stairs in or around the home? Yes  If so, are there any without handrails? No  Home free of loose throw rugs in walkways, pet beds, electrical cords, etc? Yes  Adequate lighting in your home to reduce risk of falls? Yes   ASSISTIVE DEVICES UTILIZED TO PREVENT FALLS:  Life alert? No  Use of a cane, walker or w/c? Yes  Grab bars in the bathroom? Yes Shower chair or bench in shower? Yes  Elevated toilet seat or a handicapped toilet? Yes   TIMED UP AND GO:  Was the test performed? No . Audio Visit    Cognitive Function:    01/23/2017    8:42 PM  MMSE - Mini Mental State Exam  Orientation to time 3  Orientation to Place 5  Registration 3  Attention/ Calculation 5  Recall 3  Language- name 2 objects 2  Language- repeat 1  Language- follow 3 step command 3  Language- read & follow direction 1  Write a sentence 1  Copy design 1  Total score 28        07/14/2022    1:14 PM 07/12/2021    1:46 PM  6CIT Screen  What Year? 0 points 4 points  What month? 0 points 0 points  What time? 0 points 3 points  Count back from 20 2 points 0 points  Months in reverse 4 points 4 points  Repeat phrase 10 points 0 points  Total Score 16 points 11 points    Immunizations Immunization History  Administered Date(s) Administered   Moderna Sars-Covid-2 Vaccination 05/26/2019, 06/23/2019, 03/04/2020   Td 04/10/1997    TDAP status: Due, Education has been provided regarding the importance of this vaccine. Advised may receive this vaccine at local pharmacy or Health Dept. Aware to provide a copy of the vaccination record if obtained from local pharmacy or Health Dept. Verbalized acceptance and understanding.  Flu Vaccine status: Declined, Education has been provided regarding the importance of this vaccine but patient still declined. Advised may receive this vaccine at local pharmacy or Health Dept. Aware to provide a copy of the vaccination record if obtained from  local pharmacy or Health Dept. Verbalized acceptance and understanding.  Pneumococcal vaccine status: Due, Education has been provided regarding the importance of this vaccine. Advised may receive this vaccine at local pharmacy or Health Dept. Aware to provide a copy of the vaccination record if obtained from local pharmacy or Health Dept. Verbalized acceptance and understanding.  Covid-19 vaccine status: Completed vaccines  Qualifies for Shingles Vaccine? Yes   Zostavax completed No   Shingrix Completed?: No.    Education has been provided regarding the importance of this vaccine. Patient has been advised to call insurance company to determine out of pocket expense if they have not yet received this vaccine. Advised may also receive vaccine at local pharmacy or Health Dept. Verbalized acceptance and understanding.  Screening Tests Health Maintenance  Topic Date Due   DTaP/Tdap/Td (2 - Tdap) 04/11/2007   COVID-19 Vaccine (4 - 2023-24 season) 07/30/2022 (Originally 01/06/2022)   INFLUENZA VACCINE  08/06/2022 (Originally 12/06/2021)   Zoster Vaccines- Shingrix (1 of 2) 10/14/2022 (Originally 09/05/1947)   Pneumonia Vaccine 15+ Years old (1 of 2 - PCV) 07/14/2023 (Originally 09/05/1934)   DEXA SCAN  07/14/2023 (Originally 09/06/1995)   Medicare Annual Wellness (AWV)  07/14/2023   HPV VACCINES  Aged Out    Health Maintenance  Health Maintenance Due  Topic Date Due   DTaP/Tdap/Td (2 - Tdap) 04/11/2007    Colorectal cancer screening: No longer required.   Mammogram status: No longer required due to Age.  Bone Density status: Ordered Deferred. Pt provided with contact info and advised to call to schedule appt.  Lung Cancer Screening: (Low Dose CT Chest recommended if Age 15-80 years, 30 pack-year currently smoking OR have quit w/in 15years.) does not qualify.     Additional Screening:  Hepatitis C Screening: does not qualify; Completed   Vision Screening: Recommended annual ophthalmology  exams for early detection of glaucoma and other disorders of the eye. Is the patient up to date with their annual eye exam?  No Who is the provider or what is the name of the office in which the patient attends annual eye exams? Deferred If pt is not established with a provider, would they like to be referred to a provider to establish care? No .   Dental Screening: Recommended annual dental exams for proper oral hygiene  Community Resource Referral / Chronic Care Management:  CRR required this visit?  No   CCM required this visit?  No      Plan:     I have personally reviewed and noted the following in the patient's chart:   Medical and social history Use of alcohol, tobacco or illicit drugs  Current medications and supplements including opioid prescriptions. Patient is not currently taking opioid prescriptions. Functional ability and status Nutritional status Physical activity Advanced directives List of other physicians Hospitalizations, surgeries, and ER visits in previous 12 months Vitals Screenings to include cognitive, depression, and falls Referrals and appointments  In addition, I have reviewed and discussed with patient certain preventive protocols, quality metrics, and best practice recommendations. A written personalized care plan for preventive services as well as general preventive health recommendations were provided to patient.     Criselda Peaches, LPN   [phone]   Nurse Notes: None

## 2022-07-14 NOTE — Patient Instructions (Addendum)
Theresa Norton , Thank you for taking time to come for your Medicare Wellness Visit. I appreciate your ongoing commitment to your health goals. Please review the following plan we discussed and let me know if I can assist you in the future.   These are the goals we discussed:  Goals       DIET - INCREASE WATER INTAKE      exercise 150 minutes every week      No current goals (pt-stated)      Patient Stated (pt-stated)      Keep living and stay healthy.        This is a list of the screening recommended for you and due dates:  Health Maintenance  Topic Date Due   DTaP/Tdap/Td vaccine (2 - Tdap) 04/11/2007   COVID-19 Vaccine (4 - 2023-24 season) 07/30/2022*   Flu Shot  08/06/2022*   Zoster (Shingles) Vaccine (1 of 2) 10/14/2022*   Pneumonia Vaccine (1 of 2 - PCV) 07/14/2023*   DEXA scan (bone density measurement)  07/14/2023*   Medicare Annual Wellness Visit  07/14/2023   HPV Vaccine  Aged Out  *Topic was postponed. The date shown is not the original due date.    Advanced directives: Please bring a copy of your health care power of attorney and living will to the office to be added to your chart at your convenience.   Conditions/risks identified: None  Next appointment: Follow up in one year for your annual wellness visit    Preventive Care 65 Years and Older, Female Preventive care refers to lifestyle choices and visits with your health care provider that can promote health and wellness. What does preventive care include? A yearly physical exam. This is also called an annual well check. Dental exams once or twice a year. Routine eye exams. Ask your health care provider how often you should have your eyes checked. Personal lifestyle choices, including: Daily care of your teeth and gums. Regular physical activity. Eating a healthy diet. Avoiding tobacco and drug use. Limiting alcohol use. Practicing safe sex. Taking low-dose aspirin every day. Taking vitamin and mineral  supplements as recommended by your health care provider. What happens during an annual well check? The services and screenings done by your health care provider during your annual well check will depend on your age, overall health, lifestyle risk factors, and family history of disease. Counseling  Your health care provider may ask you questions about your: Alcohol use. Tobacco use. Drug use. Emotional well-being. Home and relationship well-being. Sexual activity. Eating habits. History of falls. Memory and ability to understand (cognition). Work and work Statistician. Reproductive health. Screening  You may have the following tests or measurements: Height, weight, and BMI. Blood pressure. Lipid and cholesterol levels. These may be checked every 5 years, or more frequently if you are over 72 years old. Skin check. Lung cancer screening. You may have this screening every year starting at age 68 if you have a 30-pack-year history of smoking and currently smoke or have quit within the past 15 years. Fecal occult blood test (FOBT) of the stool. You may have this test every year starting at age 21. Flexible sigmoidoscopy or colonoscopy. You may have a sigmoidoscopy every 5 years or a colonoscopy every 10 years starting at age 59. Hepatitis C blood test. Hepatitis B blood test. Sexually transmitted disease (STD) testing. Diabetes screening. This is done by checking your blood sugar (glucose) after you have not eaten for a while (fasting). You may  have this done every 1-3 years. Bone density scan. This is done to screen for osteoporosis. You may have this done starting at age 8. Mammogram. This may be done every 1-2 years. Talk to your health care provider about how often you should have regular mammograms. Talk with your health care provider about your test results, treatment options, and if necessary, the need for more tests. Vaccines  Your health care provider may recommend certain  vaccines, such as: Influenza vaccine. This is recommended every year. Tetanus, diphtheria, and acellular pertussis (Tdap, Td) vaccine. You may need a Td booster every 10 years. Zoster vaccine. You may need this after age 53. Pneumococcal 13-valent conjugate (PCV13) vaccine. One dose is recommended after age 29. Pneumococcal polysaccharide (PPSV23) vaccine. One dose is recommended after age 25. Talk to your health care provider about which screenings and vaccines you need and how often you need them. This information is not intended to replace advice given to you by your health care provider. Make sure you discuss any questions you have with your health care provider. Document Released: 05/21/2015 Document Revised: 01/12/2016 Document Reviewed: 02/23/2015 Elsevier Interactive Patient Education  2017 [place] Prevention in the Home Falls can cause injuries. They can happen to people of all ages. There are many things you can do to make your home safe and to help prevent falls. What can I do on the outside of my home? Regularly fix the edges of walkways and driveways and fix any cracks. Remove anything that might make you trip as you walk through a door, such as a raised step or threshold. Trim any bushes or trees on the path to your home. Use bright outdoor lighting. Clear any walking paths of anything that might make someone trip, such as rocks or tools. Regularly check to see if handrails are loose or broken. Make sure that both sides of any steps have handrails. Any raised decks and porches should have guardrails on the edges. Have any leaves, snow, or ice cleared regularly. Use sand or salt on walking paths during winter. Clean up any spills in your garage right away. This includes oil or grease spills. What can I do in the bathroom? Use night lights. Install grab bars by the toilet and in the tub and shower. Do not use towel bars as grab bars. Use non-skid mats or decals in  the tub or shower. If you need to sit down in the shower, use a plastic, non-slip stool. Keep the floor dry. Clean up any water that spills on the floor as soon as it happens. Remove soap buildup in the tub or shower regularly. Attach bath mats securely with double-sided non-slip rug tape. Do not have throw rugs and other things on the floor that can make you trip. What can I do in the bedroom? Use night lights. Make sure that you have a light by your bed that is easy to reach. Do not use any sheets or blankets that are too big for your bed. They should not hang down onto the floor. Have a firm chair that has side arms. You can use this for support while you get dressed. Do not have throw rugs and other things on the floor that can make you trip. What can I do in the kitchen? Clean up any spills right away. Avoid walking on wet floors. Keep items that you use a lot in easy-to-reach places. If you need to reach something above you, use a strong step  stool that has a grab bar. Keep electrical cords out of the way. Do not use floor polish or wax that makes floors slippery. If you must use wax, use non-skid floor wax. Do not have throw rugs and other things on the floor that can make you trip. What can I do with my stairs? Do not leave any items on the stairs. Make sure that there are handrails on both sides of the stairs and use them. Fix handrails that are broken or loose. Make sure that handrails are as long as the stairways. Check any carpeting to make sure that it is firmly attached to the stairs. Fix any carpet that is loose or worn. Avoid having throw rugs at the top or bottom of the stairs. If you do have throw rugs, attach them to the floor with carpet tape. Make sure that you have a light switch at the top of the stairs and the bottom of the stairs. If you do not have them, ask someone to add them for you. What else can I do to help prevent falls? Wear shoes that: Do not have high  heels. Have rubber bottoms. Are comfortable and fit you well. Are closed at the toe. Do not wear sandals. If you use a stepladder: Make sure that it is fully opened. Do not climb a closed stepladder. Make sure that both sides of the stepladder are locked into place. Ask someone to hold it for you, if possible. Clearly mark and make sure that you can see: Any grab bars or handrails. First and last steps. Where the edge of each step is. Use tools that help you move around (mobility aids) if they are needed. These include: Canes. Walkers. Scooters. Crutches. Turn on the lights when you go into a dark area. Replace any light bulbs as soon as they burn out. Set up your furniture so you have a clear path. Avoid moving your furniture around. If any of your floors are uneven, fix them. If there are any pets around you, be aware of where they are. Review your medicines with your doctor. Some medicines can make you feel dizzy. This can increase your chance of falling. Ask your doctor what other things that you can do to help prevent falls. This information is not intended to replace advice given to you by your health care provider. Make sure you discuss any questions you have with your health care provider. Document Released: 02/18/2009 Document Revised: 09/30/2015 Document Reviewed: 05/29/2014 Elsevier Interactive Patient Education  2017 [employer].

## 2022-08-17 ENCOUNTER — Ambulatory Visit (INDEPENDENT_AMBULATORY_CARE_PROVIDER_SITE_OTHER): Payer: Medicare Other

## 2022-08-17 DIAGNOSIS — I442 Atrioventricular block, complete: Secondary | ICD-10-CM

## 2022-08-18 LAB — CUP PACEART REMOTE DEVICE CHECK
Battery Impedance: 1779 Ohm
Battery Remaining Longevity: 37 mo
Battery Voltage: 2.76 V
Brady Statistic AP VP Percent: 1 %
Brady Statistic AP VS Percent: 0 %
Brady Statistic AS VP Percent: 99 %
Brady Statistic AS VS Percent: 1 %
Date Time Interrogation Session: 20240411160745
Implantable Lead Connection Status: 753985
Implantable Lead Connection Status: 753985
Implantable Lead Implant Date: 20150120
Implantable Lead Implant Date: 20150120
Implantable Lead Location: 753859
Implantable Lead Location: 753860
Implantable Lead Model: 5076
Implantable Lead Model: 5076
Implantable Pulse Generator Implant Date: 20150120
Lead Channel Impedance Value: 518 Ohm
Lead Channel Impedance Value: 531 Ohm
Lead Channel Pacing Threshold Amplitude: 0.5 V
Lead Channel Pacing Threshold Amplitude: 1.125 V
Lead Channel Pacing Threshold Pulse Width: 0.4 ms
Lead Channel Pacing Threshold Pulse Width: 0.4 ms
Lead Channel Setting Pacing Amplitude: 2 V
Lead Channel Setting Pacing Amplitude: 2.5 V
Lead Channel Setting Pacing Pulse Width: 0.4 ms
Lead Channel Setting Sensing Sensitivity: 2.8 mV
Zone Setting Status: 755011
Zone Setting Status: 755011

## 2022-08-28 ENCOUNTER — Telehealth: Payer: Self-pay | Admitting: Family Medicine

## 2022-08-28 NOTE — Telephone Encounter (Signed)
I spoke with the patient's daughter Johnny Bridge and informed her to proceed to the emergency room for further treatment of the patient due to shortness of breath and breathing difficulties the patient has been experiencing. Patient's daughter stated she gave the patient Albuterol and declined going to the emergency room at this time. Patient's daughter stated the patient has been having respiratory issues for quite some time and would just like to schedule an appointment with Doctor. The patient is currently not having any shortness of breath, difficulty breathing or chest pains at this time. Patient's daughter informed to take patient to ED if symptoms do not improve or worsen and she verbalized understanding. Patient has been scheduled for appointment on 08/31/2022 due to being unable to bring the patient in any sooner.

## 2022-08-28 NOTE — Telephone Encounter (Signed)
Pt complained of SOB, difficulty breathing... Brittney - RN - Triage / Access Nurse - Called to say she advised Pt to call 911 immediately. Pt told RN she was not going to do that and would call back to make an appointment.

## 2022-08-31 ENCOUNTER — Ambulatory Visit (INDEPENDENT_AMBULATORY_CARE_PROVIDER_SITE_OTHER): Payer: Medicare Other

## 2022-08-31 ENCOUNTER — Encounter: Payer: Self-pay | Admitting: Internal Medicine

## 2022-08-31 ENCOUNTER — Ambulatory Visit (INDEPENDENT_AMBULATORY_CARE_PROVIDER_SITE_OTHER): Payer: Medicare Other | Admitting: Internal Medicine

## 2022-08-31 VITALS — BP 120/74 | HR 90 | Temp 98.9°F | Wt 149.3 lb

## 2022-08-31 DIAGNOSIS — R0602 Shortness of breath: Secondary | ICD-10-CM

## 2022-08-31 NOTE — Progress Notes (Signed)
Established Patient Office Visit     CC/Reason for Visit: Shortness of breath   HPI: Theresa Norton is a 89 y.o. female who is coming in today for the above mentioned reasons.  She is here today with her daughter who lives with her.  Per daughter she has been experiencing shortness of breath for about 3 months.  It has not progressed.  It is not severe.  She has not had any recent URI or seasonal allergies.  She has a history of pericardial and pleural effusions.  She last saw cardiology in 2018 when she had a pacemaker placed.  She has a history of A-fib and complete heart block.  Her daughter has been giving her albuterol every morning of her supply with minimal improvement.  She is not known to have COPD or asthma.   Past Medical/Surgical History: Past Medical History:  Diagnosis Date   Allergy    Anemia    Arthritis    "in my fingers" (07/08/2013)   CAD (coronary artery disease)    a. 05/2013 nonobs dzs by cath.   Colon polyps    Groin hematoma    a. 05/2013 R groin hematoma post-cath - u/s 05/31/13 large 7.49 cm r inguinal hematoma extending into thigh, no psa or avf.   H/O hiatal hernia    Heart block AV second degree    a. s/p MDT Addapta dual chamber pacemaker 05/2013 by Dr Johney Frame.   Hypertension    On home oxygen therapy    "2L maybe 12h/day" (07/08/2013)   Pacemaker    Pacemaker    PAF (paroxysmal atrial fibrillation)    a. Found post-pacer 05/2013->Xarelto added;  b. 05/2013 Echo: EF 60-65%, mild LVH, nl wall motion w/o rwma.    Past Surgical History:  Procedure Laterality Date   BREAST CYST EXCISION Right 1959; 1970's   BUNIONECTOMY Bilateral 1967   CARDIAC CATHETERIZATION     CATARACT EXTRACTION W/ INTRAOCULAR LENS  IMPLANT, BILATERAL Bilateral    DILATION AND CURETTAGE OF UTERUS     INSERT / REPLACE / REMOVE PACEMAKER  05/2013   MDT ADDRL1 implanted by Dr Johney Frame for heart block   INSERT / REPLACE / REMOVE PACEMAKER  06/2013   Right ventricular lead  perforation with tamponade   LEAD REVISION N/A 06/13/2013   Procedure: LEAD REVISION;  Surgeon: Gardiner Rhyme, MD;  Location: [facility];  Service: Cardiovascular;  Laterality: N/A;   LEFT HEART CATHETERIZATION WITH CORONARY ANGIOGRAM N/A 05/27/2013   Procedure: LEFT HEART CATHETERIZATION WITH CORONARY ANGIOGRAM;  Surgeon: Ricki Rodriguez, MD;  Location: [facility];  Service: Cardiovascular;  Laterality: N/A;   PERICARDIAL TAP N/A 06/11/2013   Procedure: PERICARDIAL TAP;  Surgeon: Ricki Rodriguez, MD;  Location: [facility];  Service: Cardiovascular;  Laterality: N/A;   PERMANENT PACEMAKER INSERTION N/A 05/27/2013   Procedure: PERMANENT PACEMAKER INSERTION;  Surgeon: Gardiner Rhyme, MD;  Location: [facility];  Service: Cardiovascular;  Laterality: N/A;   TEMPORARY PACEMAKER INSERTION N/A 05/27/2013   Procedure: TEMPORARY PACEMAKER INSERTION;  Surgeon: Ricki Rodriguez, MD;  Location: [facility];  Service: Cardiovascular;  Laterality: N/A;   VARICOSE VEIN SURGERY Bilateral ~ 2002    Social History:  reports that she has never smoked. She has never used smokeless tobacco. She reports current alcohol use of about 14.0 standard drinks of alcohol per week. She reports that she does not use drugs.  Allergies: Allergies  Allergen Reactions  Codeine     GI upset   Morphine And Related Other (See Comments)    GI upset GI upset   Other Other (See Comments)    Pain killers, unknown,     Family History:  Family History  Problem Relation Age of Onset   Cancer Father        lung     Current Outpatient Medications:    Acetaminophen (TYLENOL 8 HOUR PO), Take by mouth., Disp: , Rfl:    amLODipine (NORVASC) 5 MG tablet, Take 1 tablet (5 mg total) by mouth daily., Disp: 90 tablet, Rfl: 1   Cyanocobalamin (VITAMIN B 12 PO), Take by mouth., Disp: , Rfl:    donepezil (ARICEPT) 5 MG tablet, Take 1 tablet (5 mg total) by mouth daily., Disp: 90 tablet, Rfl: 1   furosemide (LASIX) 20 MG tablet, TAKE 1  TABLET BY MOUTH DAILY AS NEEDED FOR LEG SWELLING, Disp: 90 tablet, Rfl: 1   sertraline (ZOLOFT) 50 MG tablet, Take 1 tablet (50 mg total) by mouth daily., Disp: 90 tablet, Rfl: 1   TURMERIC PO, Take 1 capsule by mouth daily., Disp: , Rfl:   Review of Systems:  Negative unless indicated in HPI.   Physical Exam: Vitals:   08/31/22 1344  BP: 120/74  Pulse: 90  Temp: 98.9 F (37.2 C)  TempSrc: Oral  SpO2: 94%  Weight: 149 lb 4.8 oz (67.7 kg)    Body mass index is 29.16 kg/m.   Physical Exam Vitals reviewed.  Constitutional:      Appearance: Normal appearance.  HENT:     Head: Normocephalic and atraumatic.  Eyes:     Conjunctiva/sclera: Conjunctivae normal.     Pupils: Pupils are equal, round, and reactive to light.  Cardiovascular:     Rate and Rhythm: Normal rate and regular rhythm.     Heart sounds: Murmur heard.  Pulmonary:     Effort: Pulmonary effort is normal.     Breath sounds: Examination of the right-lower field reveals decreased breath sounds. Examination of the left-lower field reveals decreased breath sounds. Decreased breath sounds present.  Skin:    General: Skin is warm and dry.  Neurological:     General: No focal deficit present.     Mental Status: She is alert and oriented to person, place, and time.  Psychiatric:        Mood and Affect: Mood normal.        Behavior: Behavior normal.        Thought Content: Thought content normal.        Judgment: Judgment normal.      Impression and Plan:  SOB (shortness of breath) -     DG Chest 2 View; Future -     Ambulatory referral to Cardiology   -She has history of pleural effusions and with blunting of pulmonary sounds at bilateral bases, I wonder about this.  Chest x-ray will be sent today.  With her history she also needs to be seen by cardiology I will send her back.  Will likely need cardiac workup as well.  Time spent:32 minutes reviewing chart, interviewing and examining patient and formulating  plan of care.     Chaya Jan, MD [facility]

## 2022-09-21 NOTE — Progress Notes (Signed)
Remote pacemaker transmission.   

## 2022-10-04 ENCOUNTER — Encounter: Payer: Self-pay | Admitting: Cardiovascular Disease

## 2022-10-04 ENCOUNTER — Ambulatory Visit: Payer: Medicare Other | Attending: Cardiovascular Disease | Admitting: Cardiovascular Disease

## 2022-10-04 VITALS — BP 112/66 | HR 83 | Ht 60.0 in | Wt 145.4 lb

## 2022-10-04 DIAGNOSIS — I441 Atrioventricular block, second degree: Secondary | ICD-10-CM | POA: Insufficient documentation

## 2022-10-04 DIAGNOSIS — I480 Paroxysmal atrial fibrillation: Secondary | ICD-10-CM | POA: Insufficient documentation

## 2022-10-04 DIAGNOSIS — Z950 Presence of cardiac pacemaker: Secondary | ICD-10-CM | POA: Insufficient documentation

## 2022-10-04 DIAGNOSIS — R0602 Shortness of breath: Secondary | ICD-10-CM | POA: Insufficient documentation

## 2022-10-04 NOTE — Assessment & Plan Note (Signed)
Patient was referred by her PCP for dyspnea and shortness of breath.  She has no history of smoking.  She has had pericardial tamponade in the past status post pericardiocentesis in 2015 by Dr. Algie Coffer.  She had cardiac catheterization at that time that showed minimal CAD.  Apparently her shortness of breath improved with albuterol.  Will check a 2D echocardiogram.  I do not think she needs workup for myocardial ischemia.

## 2022-10-04 NOTE — Assessment & Plan Note (Signed)
History of pericardial tamponade in the past status post pericardiocentesis by Dr. Algie Coffer 06/11/2013.  She is complain of shortness of breath of the last several months.  Will check a 2D echo.  Should be noted that the patient is a DNR and does not wish invasive procedures at this time.

## 2022-10-04 NOTE — Assessment & Plan Note (Signed)
History of atrial fibrillation in the past currently ventricular paced not on oral anticoagulants.

## 2022-10-04 NOTE — Patient Instructions (Signed)
Medication Instructions:  Your physician recommends that you continue on your current medications as directed. Please refer to the Current Medication list given to you today.  *If you need a refill on your cardiac medications before your next appointment, please call your pharmacy*    Testing/Procedures: Your physician has requested that you have an echocardiogram. Echocardiography is a painless test that uses sound waves to create images of your heart. It provides your doctor with information about the size and shape of your heart and how well your heart's chambers and valves are working. This procedure takes approximately one hour. There are no restrictions for this procedure. Please do NOT wear cologne, perfume, aftershave, or lotions (deodorant is allowed). Please arrive 15 minutes prior to your appointment time. This will take place at [address]    Follow-Up: At [facility], you and your health needs are our priority.  As part of our continuing mission to provide you with exceptional heart care, we have created designated Provider Care Teams.  These Care Teams include your primary Cardiologist (physician) and Advanced Practice Providers (APPs -  Physician Assistants and Nurse Practitioners) who all work together to provide you with the care you need, when you need it.  We recommend signing up for the patient portal called "MyChart".  Sign up information is provided on this After Visit Summary.  MyChart is used to connect with patients for Virtual Visits (Telemedicine).  Patients are able to view lab/test results, encounter notes, upcoming appointments, etc.  Non-urgent messages can be sent to your provider as well.   To learn more about what you can do with MyChart, go to [email or web address].    Your next appointment:   We will see you on an as needed basis.  Provider:   Nanetta Batty, MD

## 2022-10-04 NOTE — Assessment & Plan Note (Signed)
History of AV block status post permanent transvenous pacemaker insertion by Dr. Johney Frame 05/27/2013.  She has not seen a electrophysiologist face-to-face in over 6 years although she has been followed remotely.  We will arrange her to see electrophysiologist.

## 2022-10-04 NOTE — Progress Notes (Signed)
10/04/2022 Deandre Brownfield Havlin   1929/03/02  [id number]  Primary Physician Burchette, Elberta Fortis, MD Primary Cardiologist: Runell Gess MD Nicholes Calamity, [place]  HPI:  Theresa Norton is a 89 y.o. frail-appearing widowed Caucasian female mother of 4, grandmother of 3 grandchildren is accompanied by one of her daughters Johnny Bridge today who she lives with.  Patient is a DNR and does not want invasive procedures performed.  She was referred by Dr. Ardyth Harps, her PCP, for shortness of breath.  Her only cardiac risk factors notable for treated hypertension.  She is never had a heart attack or stroke.  She did have a permanent transvenous pacemaker implanted by Dr. Johney Frame 05/27/2013 and has not been seen by electrophysiology since 2018 but has been followed remotely.  She was catheterized by Dr. Algie Coffer back in 2015 and had nonobstructive CAD.  She also had pericardial tamponade and underwent pericardiocentesis by Dr. Algie Coffer in 2015.  She currently lives with one of her daughters she.  She is complaining of shortness of breath of last several months improved with her daughter's albuterol.  A chest x-ray ordered by Dr. Ardyth Harps showed minimal pleural effusions without large cardiac silhouette.   Current Meds  Medication Sig   Acetaminophen (TYLENOL 8 HOUR PO) Take by mouth.   amLODipine (NORVASC) 5 MG tablet Take 1 tablet (5 mg total) by mouth daily.   Cholecalciferol (VITAMIN D3) 10 MCG (400 UNIT) tablet Take 400 Units by mouth daily.   Cyanocobalamin (VITAMIN B 12 PO) Take by mouth.   donepezil (ARICEPT) 5 MG tablet Take 1 tablet (5 mg total) by mouth daily.   furosemide (LASIX) 20 MG tablet TAKE 1 TABLET BY MOUTH DAILY AS NEEDED FOR LEG SWELLING   loratadine (CLARITIN) 10 MG tablet Take 10 mg by mouth daily.   Multiple Vitamins-Minerals (PRESERVISION AREDS PO) Take by mouth.   sertraline (ZOLOFT) 50 MG tablet Take 1 tablet (50 mg total) by mouth daily.   TURMERIC PO Take 1 capsule by mouth  daily.     Allergies  Allergen Reactions   Codeine     GI upset   Morphine And Codeine Other (See Comments)    GI upset GI upset   Other Other (See Comments)    Pain killers, unknown,     Social History   Socioeconomic History   Marital status: Widowed    Spouse name: Not on file   Number of children: Not on file   Years of education: Not on file   Highest education level: Not on file  Occupational History   Not on file  Tobacco Use   Smoking status: Never   Smokeless tobacco: Never  Vaping Use   Vaping Use: Never used  Substance and Sexual Activity   Alcohol use: Yes    Alcohol/week: 14.0 standard drinks of alcohol    Types: 7 Glasses of wine, 7 Cans of beer per week    Comment: 2019: wine in the evening    Drug use: No   Sexual activity: Never  Other Topics Concern   Not on file  Social History Narrative   Living at [facility]   Social Determinants of Health   Financial Resource Strain: Low Risk  (07/14/2022)   Overall Financial Resource Strain (CARDIA)    Difficulty of Paying Living Expenses: Not hard at all  Food Insecurity: No Food Insecurity (07/14/2022)   Hunger Vital Sign    Worried About Running Out of Food in the Last  Year: Never true    Ran Out of Food in the Last Year: Never true  Transportation Needs: No Transportation Needs (07/14/2022)   PRAPARE - Administrator, Civil Service (Medical): No    Lack of Transportation (Non-Medical): No  Physical Activity: Inactive (07/14/2022)   Exercise Vital Sign    Days of Exercise per Week: 0 days    Minutes of Exercise per Session: 0 min  Stress: No Stress Concern Present (07/14/2022)   [employer] of Occupational Health - Occupational Stress Questionnaire    Feeling of Stress : Not at all  Social Connections: Socially Isolated (07/14/2022)   Social Connection and Isolation Panel [NHANES]    Frequency of Communication with Friends and Family: More than three times a week    Frequency of  Social Gatherings with Friends and Family: More than three times a week    Attends Religious Services: Never    Database administrator or Organizations: No    Attends Banker Meetings: Never    Marital Status: Widowed  Intimate Partner Violence: Not At Risk (07/14/2022)   Humiliation, Afraid, Rape, and Kick questionnaire    Fear of Current or Ex-Partner: No    Emotionally Abused: No    Physically Abused: No    Sexually Abused: No     Review of Systems: General: negative for chills, fever, night sweats or weight changes.  Cardiovascular: negative for chest pain, dyspnea on exertion, edema, orthopnea, palpitations, paroxysmal nocturnal dyspnea or shortness of breath Dermatological: negative for rash Respiratory: negative for cough or wheezing Urologic: negative for hematuria Abdominal: negative for nausea, vomiting, diarrhea, bright red blood per rectum, melena, or hematemesis Neurologic: negative for visual changes, syncope, or dizziness All other systems reviewed and are otherwise negative except as noted above.    Blood pressure 112/66, pulse 83, height 5' (1.524 m), weight 145 lb 6.4 oz (66 kg), SpO2 93 %.  General appearance: alert and no distress Neck: no adenopathy, no carotid bruit, no JVD, supple, symmetrical, trachea midline, and thyroid not enlarged, symmetric, no tenderness/mass/nodules Lungs: clear to auscultation bilaterally Heart: regular rate and rhythm, S1, S2 normal, no murmur, click, rub or gallop Extremities: extremities normal, atraumatic, no cyanosis or edema Pulses: 2+ and symmetric Skin: Skin color, texture, turgor normal. No rashes or lesions Neurologic: Grossly normal  EKG ventricular paced rhythm at 83.  I personally reviewed this EKG.  ASSESSMENT AND PLAN:   Elevated blood pressure History of hypertension a blood pressure measured today at 112/66.  She is on amlodipine.  Wenckebach second degree AV block History of AV block status post  permanent transvenous pacemaker insertion by Dr. Johney Frame 05/27/2013.  She has not seen a electrophysiologist face-to-face in over 6 years although she has been followed remotely.  We will arrange her to see electrophysiologist.  AF (atrial fibrillation) (HCC) History of atrial fibrillation in the past currently ventricular paced not on oral anticoagulants.  Pericardial effusion with cardiac tamponade History of pericardial tamponade in the past status post pericardiocentesis by Dr. Algie Coffer 06/11/2013.  She is complain of shortness of breath of the last several months.  Will check a 2D echo.  Should be noted that the patient is a DNR and does not wish invasive procedures at this time.  Dyspnea Patient was referred by her PCP for dyspnea and shortness of breath.  She has no history of smoking.  She has had pericardial tamponade in the past status post pericardiocentesis in 2015 by Dr. Algie Coffer.  She had cardiac catheterization at that time that showed minimal CAD.  Apparently her shortness of breath improved with albuterol.  Will check a 2D echocardiogram.  I do not think she needs workup for myocardial ischemia.     Runell Gess MD FACP,FACC,FAHA, [facility] 10/04/2022 2:29 PM

## 2022-10-04 NOTE — Assessment & Plan Note (Signed)
History of hypertension a blood pressure measured today at 112/66.  She is on amlodipine.

## 2022-10-12 ENCOUNTER — Other Ambulatory Visit: Payer: Self-pay | Admitting: Family Medicine

## 2022-10-12 DIAGNOSIS — R4189 Other symptoms and signs involving cognitive functions and awareness: Secondary | ICD-10-CM

## 2022-10-12 DIAGNOSIS — I10 Essential (primary) hypertension: Secondary | ICD-10-CM

## 2022-10-30 ENCOUNTER — Ambulatory Visit (HOSPITAL_COMMUNITY): Payer: Medicare Other | Attending: Cardiovascular Disease

## 2022-10-30 DIAGNOSIS — R0602 Shortness of breath: Secondary | ICD-10-CM | POA: Diagnosis present

## 2022-10-30 DIAGNOSIS — I441 Atrioventricular block, second degree: Secondary | ICD-10-CM | POA: Diagnosis present

## 2022-10-30 DIAGNOSIS — Z950 Presence of cardiac pacemaker: Secondary | ICD-10-CM | POA: Insufficient documentation

## 2022-10-30 DIAGNOSIS — I480 Paroxysmal atrial fibrillation: Secondary | ICD-10-CM | POA: Insufficient documentation

## 2022-10-30 LAB — ECHOCARDIOGRAM COMPLETE
AR max vel: 1.74 cm2
AV Area VTI: 1.55 cm2
AV Area mean vel: 1.53 cm2
AV Mean grad: 15 mmHg
AV Peak grad: 28.8 mmHg
Ao pk vel: 2.69 m/s
Area-P 1/2: 2.74 cm2
S' Lateral: 1.8 cm

## 2022-11-02 ENCOUNTER — Telehealth: Payer: Self-pay | Admitting: Cardiovascular Disease

## 2022-11-02 NOTE — Telephone Encounter (Signed)
Patient is returning call for results. Requesting return call.  

## 2022-11-02 NOTE — Telephone Encounter (Signed)
Spoke with pt daughter, aware of echo results. She is not interested in taking the patient to see another doctor. She reports it is too hard to get her out. Will make dr berry aware and call her back if he has any other recommendations. Pt daughter agreed with this plan.

## 2022-11-02 NOTE — Telephone Encounter (Signed)
Please call patient daughter as unsure of the abbreviations to the results of Echo.  Thank you

## 2022-11-03 NOTE — Telephone Encounter (Signed)
Left message for patient daughter dr berry's recommendations.

## 2022-11-16 ENCOUNTER — Ambulatory Visit: Payer: Medicare Other

## 2022-11-16 DIAGNOSIS — I442 Atrioventricular block, complete: Secondary | ICD-10-CM

## 2022-11-21 LAB — CUP PACEART REMOTE DEVICE CHECK
Battery Impedance: 1899 Ohm
Battery Remaining Longevity: 34 mo
Battery Voltage: 2.75 V
Brady Statistic AP VP Percent: 1 %
Brady Statistic AP VS Percent: 0 %
Brady Statistic AS VP Percent: 99 %
Brady Statistic AS VS Percent: 1 %
Date Time Interrogation Session: 20240715222553
Implantable Lead Connection Status: 753985
Implantable Lead Connection Status: 753985
Implantable Lead Implant Date: 20150120
Implantable Lead Implant Date: 20150120
Implantable Lead Location: 753859
Implantable Lead Location: 753860
Implantable Lead Model: 5076
Implantable Lead Model: 5076
Implantable Pulse Generator Implant Date: 20150120
Lead Channel Impedance Value: 476 Ohm
Lead Channel Impedance Value: 501 Ohm
Lead Channel Pacing Threshold Amplitude: 0.5 V
Lead Channel Pacing Threshold Amplitude: 1.125 V
Lead Channel Pacing Threshold Pulse Width: 0.4 ms
Lead Channel Pacing Threshold Pulse Width: 0.4 ms
Lead Channel Setting Pacing Amplitude: 2 V
Lead Channel Setting Pacing Amplitude: 2.5 V
Lead Channel Setting Pacing Pulse Width: 0.4 ms
Lead Channel Setting Sensing Sensitivity: 2.8 mV
Zone Setting Status: 755011
Zone Setting Status: 755011

## 2022-12-05 NOTE — Progress Notes (Deleted)
  Electrophysiology Office Note:   Date:  12/06/2022  ID:  Theresa Norton, DOB October 30, 1928, MRN [id number]  Primary Cardiologist: None Electrophysiologist: Will Jorja Loa, MD  {Click to update primary MD,subspecialty MD or APP then REFRESH:1}    History of Present Illness:   Theresa Norton is a 89 y.o. female with h/o oxygen dependent, HTN, HLD, CAD, PAF, 2nd degree AVB>CHB s/p PPM seen for routine electrophysiology followup.   She was last seen by Dr. Allyson Sabal in clinic.  Most recent EP visit 2018. Notes reflect she is a DNR and does not want invasive procedures performed.   Last remote check 11/16/2022 which showed normal remote, battery & lead parameters per Dr. Elberta Fortis review.   Since last being seen in our clinic the patient reports doing ***.  she denies chest pain, palpitations, dyspnea, PND, orthopnea, nausea, vomiting, dizziness, syncope, edema, weight gain, or early satiety.   Review of systems complete and found to be negative unless listed in HPI.      EP Information / Studies Reviewed:    {EKGtoday:28818}      PPM Interrogation-  reviewed in detail today,  See PACEART report.  Device History: Medtronic Dual Chamber PPM implanted 05/27/2013 for CHB  Studies: ECHO 10/30/22 > technically difficult study, LVEF 60-65%, no RWMA, severe asymmetric LV hypertrophy of the basal septal segment, LV diastolic parameters indeterminate. RV systolic function normal, degenerative MV, mild MVR, no stenosis, severe mitral annular calcification, moderate thickening of AV, mild-mod aortic valve stenosis.   Risk Assessment/Calculations:    CHA2DS2-VASc Score =    {Click here to calculate score.  REFRESH note before signing. :1} This indicates a  % annual risk of stroke. The patient's score is based upon:      No BP recorded.  {Refresh Note OR Click here to enter BP  :1}***        Physical Exam:   VS:  There were no vitals taken for this visit.   Wt Readings from Last 3  Encounters:  10/04/22 145 lb 6.4 oz (66 kg)  08/31/22 149 lb 4.8 oz (67.7 kg)  07/14/22 149 lb (67.6 kg)     GEN: Well nourished, well developed in no acute distress NECK: No JVD; No carotid bruits CARDIAC: {EPRHYTHM:28826}, no murmurs, rubs, gallops RESPIRATORY:  Clear to auscultation without rales, wheezing or rhonchi  ABDOMEN: Soft, non-tender, non-distended EXTREMITIES:  No edema; No deformity   ASSESSMENT AND PLAN:    Second Degree AV block > CHB s/p Medtronic PPM  -Normal PPM function -See Pace Art report -No changes today -not seen by EP since 2018, remote checks only   Paroxysmal Atrial Fibrillation  Hx of AF, not on OAC -***  Hx Pericardial Effusion with hx of Tamponade Tamponade s/p pericardiocentesis 06/2013 -DNR, does not want invasive procedures  -no mention of effusion on ECHO 10/2022    Disposition:   Follow up with Dr. Elberta Fortis {EPFOLLOW NG:29528}  Signed, Canary Brim, MSN, APRN, NP-C, AGACNP-BC [facility] - Electrophysiology  12/06/2022, 7:56 AM

## 2022-12-05 NOTE — Progress Notes (Signed)
Remote pacemaker transmission.   

## 2022-12-07 ENCOUNTER — Ambulatory Visit: Payer: Medicare Other | Attending: Physician Assistant | Admitting: Physician Assistant

## 2022-12-07 DIAGNOSIS — Z950 Presence of cardiac pacemaker: Secondary | ICD-10-CM

## 2022-12-07 DIAGNOSIS — I480 Paroxysmal atrial fibrillation: Secondary | ICD-10-CM

## 2022-12-07 DIAGNOSIS — I442 Atrioventricular block, complete: Secondary | ICD-10-CM

## 2022-12-07 DIAGNOSIS — I441 Atrioventricular block, second degree: Secondary | ICD-10-CM

## 2022-12-20 ENCOUNTER — Encounter: Payer: Self-pay | Admitting: General Practice

## 2023-01-11 ENCOUNTER — Other Ambulatory Visit: Payer: Self-pay | Admitting: Family Medicine

## 2023-01-11 DIAGNOSIS — R4189 Other symptoms and signs involving cognitive functions and awareness: Secondary | ICD-10-CM

## 2023-01-11 DIAGNOSIS — I10 Essential (primary) hypertension: Secondary | ICD-10-CM

## 2023-04-05 ENCOUNTER — Other Ambulatory Visit: Payer: Self-pay | Admitting: Family Medicine

## 2023-04-09 ENCOUNTER — Other Ambulatory Visit: Payer: Self-pay | Admitting: Family Medicine

## 2023-05-14 ENCOUNTER — Telehealth: Payer: Self-pay

## 2023-05-14 NOTE — Telephone Encounter (Signed)
 Copied from CRM [id number]. Topic: Clinical - Medical Advice >> May 14, 2023 11:37 AM Isabell A wrote: Reason for CRM: [place] (daughter) states patient is experiencing a terrible cough, wants know what the best thing to do. Patient has tried Mucinex  and warm beverages.

## 2023-05-14 NOTE — Telephone Encounter (Signed)
 Please advise if it is appropiate for a video visit for today.

## 2023-05-14 NOTE — Telephone Encounter (Signed)
 Spoke to [place] and she stated that its hard to get pt out of the house due to weather and illness. Johnny Bridge stated she was just wanting a suggestion for OTC medication that can help pt.

## 2023-05-15 NOTE — Telephone Encounter (Signed)
 Johnny Bridge notified of message below. No further action needed.

## 2023-05-16 ENCOUNTER — Other Ambulatory Visit: Payer: Self-pay | Admitting: Family Medicine

## 2023-05-17 ENCOUNTER — Ambulatory Visit (INDEPENDENT_AMBULATORY_CARE_PROVIDER_SITE_OTHER): Payer: Medicare Other

## 2023-05-17 DIAGNOSIS — I442 Atrioventricular block, complete: Secondary | ICD-10-CM

## 2023-05-21 LAB — CUP PACEART REMOTE DEVICE CHECK
Battery Impedance: 2027 Ohm
Battery Remaining Longevity: 32 mo
Battery Voltage: 2.75 V
Brady Statistic AP VP Percent: 1 %
Brady Statistic AP VS Percent: 0 %
Brady Statistic AS VP Percent: 99 %
Brady Statistic AS VS Percent: 1 %
Date Time Interrogation Session: 20250112201948
Implantable Lead Connection Status: 753985
Implantable Lead Connection Status: 753985
Implantable Lead Implant Date: 20150120
Implantable Lead Implant Date: 20150120
Implantable Lead Location: 753859
Implantable Lead Location: 753860
Implantable Lead Model: 5076
Implantable Lead Model: 5076
Implantable Pulse Generator Implant Date: 20150120
Lead Channel Impedance Value: 457 Ohm
Lead Channel Impedance Value: 520 Ohm
Lead Channel Pacing Threshold Amplitude: 0.5 V
Lead Channel Pacing Threshold Amplitude: 1.125 V
Lead Channel Pacing Threshold Pulse Width: 0.4 ms
Lead Channel Pacing Threshold Pulse Width: 0.4 ms
Lead Channel Setting Pacing Amplitude: 2 V
Lead Channel Setting Pacing Amplitude: 2.5 V
Lead Channel Setting Pacing Pulse Width: 0.4 ms
Lead Channel Setting Sensing Sensitivity: 2.8 mV
Zone Setting Status: 755011
Zone Setting Status: 755011

## 2023-06-12 ENCOUNTER — Other Ambulatory Visit: Payer: Self-pay | Admitting: Family Medicine

## 2023-06-26 NOTE — Addendum Note (Signed)
 Addended by: Elease Etienne A on: 06/26/2023 02:50 PM   Modules accepted: Orders

## 2023-06-26 NOTE — Progress Notes (Signed)
 Remote pacemaker transmission.

## 2023-07-26 ENCOUNTER — Encounter: Payer: Self-pay | Admitting: Family Medicine

## 2023-07-26 ENCOUNTER — Ambulatory Visit: Payer: Medicare Other | Admitting: Family Medicine

## 2023-07-26 DIAGNOSIS — Z0000 Encounter for general adult medical examination without abnormal findings: Secondary | ICD-10-CM

## 2023-07-26 NOTE — Progress Notes (Signed)
 Patient unable to obtain vital signs due to telehealth visit

## 2023-07-26 NOTE — Progress Notes (Signed)
 PATIENT CHECK-IN and HEALTH RISK ASSESSMENT QUESTIONNAIRE:  -completed by phone/video for upcoming Medicare Preventive Visit  Pre-Visit Check-in: 1)Vitals (height, wt, BP, etc) - record in vitals section for visit on day of visit Request home vitals (wt, BP, etc.) and enter into vitals, THEN update Vital Signs SmartPhrase below at the top of the HPI. See below.  2)Review and Update Medications, Allergies PMH, Surgeries, Social history in Epic 3)Hospitalizations in the last year with date/reason? No  4)Review and Update Care Team (patient's specialists) in Epic 5) Complete PHQ9 in Epic  6) Complete Fall Screening in Epic 7)Review all Health Maintenance Due and order under PCP if not done.  Medicare Wellness Patient Questionnaire:  Answer theses question about your habits: How often do you have a drink containing alcohol? Wine, about 6 glasses per days Have you ever smoked?No  Do you use smokeless tobacco?No Do you use an illicit drugs?No On average, how many days per week do you engage in moderate to strenuous exercise (like a brisk walk)?N/A On average, how many minutes do you engage in exercise at this level?N/A Typical breakfast: Varies  - daughter reports she eats 3 meals per day and eats well Typical lunch: Varies  Typical dinner: Varies  Typical snacks: Cheese, Crackers, Avocados, Chocolate   Beverages: Wine   Answer theses question about your everyday activities: Can you perform most household chores?No - daughter lives with  her and does chores Are you deaf or have significant trouble hearing? Yes, tried hearing aids but she did not like them Do you feel that you have a problem with memory?Yes  Do you feel safe at home? Yes  Last dentist visit? Unknown 8. Do you have any difficulty performing your everyday activities?Yes  Are you having any difficulty walking, taking medications on your own, and or difficulty managing daily home needs?Yes  Do you have difficulty walking or  climbing stairs?Yes  Do you have difficulty dressing or bathing? Yes  Do you have difficulty doing errands alone such as visiting a doctor's office or shopping? Yes  Do you currently have any difficulty preparing food and eating? Yes  Do you currently have any difficulty using the toilet?No Do you have any difficulty managing your finances? No Do you have any difficulties with housekeeping of managing your housekeeping? Yes    Do you have Advanced Directives in place (Living Will, Healthcare Power or Attorney)?  Yes    Last eye Exam and location? Unknown    Do you currently use prescribed or non-prescribed narcotic or opioid pain medications? No  Do you have a history or close family history of breast, ovarian, tubal or peritoneal cancer or a family member with BRCA (breast cancer susceptibility 1 and 2) gene mutations? Yes   Nurse/Assistant Credentials/time stamp:    ----------------------------------------------------------------------------------------------------------------------------------------------------------------------------------------------------------------------  Because this visit was a virtual/telehealth visit, some criteria may be missing or patient reported. Any vitals not documented were not able to be obtained and vitals that have been documented are patient reported.    MEDICARE ANNUAL PREVENTIVE VISIT WITH PROVIDER: (Welcome to Medicare, initial annual wellness or annual wellness exam)  Virtual Visit via Video Note  I connected with Theresa Norton on 07/26/23 by a video enabled telemedicine application and verified that I am speaking with the correct person using two identifiers.  Location patient: home Location provider:work or home office Persons participating in the virtual visit: patient, provider, daughter  Concerns and/or follow up today: stable, daughter weaned her off of zoloft when sick  recently and she seems to be doing better off of it.  She used to have bathroom accidents and no longer does since off the zoloft. They also report she feels fine and does not have any increase in mood symptoms. She feels normal mood for age.    has a past medical history of Allergy, Anemia, Arthritis, CAD (coronary artery disease), Colon polyps, Groin hematoma, H/O hiatal hernia, Heart block AV second degree, Hypertension, On home oxygen therapy, Pacemaker, Pacemaker, and PAF (paroxysmal atrial fibrillation) (HCC).  See HM section in Epic for other details of completed HM.    ROS: negative for report of fevers, unintentional weight loss, vision changes, vision loss, hearing loss or change, chest pain, sob, hemoptysis, melena, hematochezia, hematuria, falls, bleeding or bruising, thoughts of suicide or self harm, memory loss  Patient-completed extensive health risk assessment - reviewed and discussed with the patient: See Health Risk Assessment completed with patient prior to the visit either above or in recent phone note. This was reviewed in detailed with the patient today and appropriate recommendations, orders and referrals were placed as needed per Summary below and patient instructions.   Review of Medical History: -PMH, PSH, Family History and current specialty and care providers reviewed and updated and listed below   Patient Care Team: Kristian Covey, MD as PCP - General (Family Medicine) Regan Lemming, MD as PCP - Electrophysiology (Cardiology) [facility] as Registered Nurse   Past Medical History:  Diagnosis Date   Allergy    Anemia    Arthritis    "in my fingers" (07/08/2013)   CAD (coronary artery disease)    a. 05/2013 nonobs dzs by cath.   Colon polyps    Groin hematoma    a. 05/2013 R groin hematoma post-cath - u/s 05/31/13 large 7.49 cm r inguinal hematoma extending into thigh, no psa or avf.   H/O hiatal hernia    Heart block AV second degree    a. s/p MDT Addapta dual chamber pacemaker 05/2013 by  Dr Johney Frame.   Hypertension    On home oxygen therapy    "2L maybe 12h/day" (07/08/2013)   Pacemaker    Pacemaker    PAF (paroxysmal atrial fibrillation) (HCC)    a. Found post-pacer 05/2013->Xarelto added;  b. 05/2013 Echo: EF 60-65%, mild LVH, nl wall motion w/o rwma.    Past Surgical History:  Procedure Laterality Date   BREAST CYST EXCISION Right 1959; 1970's   BUNIONECTOMY Bilateral 1967   CARDIAC CATHETERIZATION     CATARACT EXTRACTION W/ INTRAOCULAR LENS  IMPLANT, BILATERAL Bilateral    DILATION AND CURETTAGE OF UTERUS     INSERT / REPLACE / REMOVE PACEMAKER  05/2013   MDT [id number] implanted by Dr Johney Frame for heart block   INSERT / REPLACE / REMOVE PACEMAKER  06/2013   Right ventricular lead perforation with tamponade   LEAD REVISION N/A 06/13/2013   Procedure: LEAD REVISION;  Surgeon: Gardiner Rhyme, MD;  Location: [facility];  Service: Cardiovascular;  Laterality: N/A;   LEFT HEART CATHETERIZATION WITH CORONARY ANGIOGRAM N/A 05/27/2013   Procedure: LEFT HEART CATHETERIZATION WITH CORONARY ANGIOGRAM;  Surgeon: Ricki Rodriguez, MD;  Location: [facility];  Service: Cardiovascular;  Laterality: N/A;   PERICARDIAL TAP N/A 06/11/2013   Procedure: PERICARDIAL TAP;  Surgeon: Ricki Rodriguez, MD;  Location: [facility];  Service: Cardiovascular;  Laterality: N/A;   PERMANENT PACEMAKER INSERTION N/A 05/27/2013   Procedure: PERMANENT PACEMAKER INSERTION;  Surgeon:  Gardiner Rhyme, MD;  Location: [facility];  Service: Cardiovascular;  Laterality: N/A;   TEMPORARY PACEMAKER INSERTION N/A 05/27/2013   Procedure: TEMPORARY PACEMAKER INSERTION;  Surgeon: Ricki Rodriguez, MD;  Location: [facility];  Service: Cardiovascular;  Laterality: N/A;   VARICOSE VEIN SURGERY Bilateral ~ 2002    Social History   Socioeconomic History   Marital status: Widowed    Spouse name: Not on file   Number of children: Not on file   Years of education: Not on file   Highest education level: Not on file  Occupational  History   Not on file  Tobacco Use   Smoking status: Never   Smokeless tobacco: Never  Vaping Use   Vaping status: Never Used  Substance and Sexual Activity   Alcohol use: Yes    Alcohol/week: 14.0 standard drinks of alcohol    Types: 7 Glasses of wine, 7 Cans of beer per week    Comment: 2019: wine in the evening    Drug use: No   Sexual activity: Never  Other Topics Concern   Not on file  Social History Narrative   [facility] at [facility]   Social Drivers of Health   Financial Resource Strain: Low Risk  (07/26/2023)   Overall Financial Resource Strain (CARDIA)    Difficulty of Paying Living Expenses: Not hard at all  Food Insecurity: No Food Insecurity (07/26/2023)   Hunger Vital Sign    Worried About Running Out of Food in the Last Year: Never true    Ran Out of Food in the Last Year: Never true  Transportation Needs: No Transportation Needs (07/26/2023)   PRAPARE - Administrator, Civil Service (Medical): No    Lack of Transportation (Non-Medical): No  Physical Activity: Inactive (07/26/2023)   Exercise Vital Sign    Days of Exercise per Week: 0 days    Minutes of Exercise per Session: 0 min  Stress: No Stress Concern Present (07/26/2023)   [employer] of Occupational Health - Occupational Stress Questionnaire    Feeling of Stress : Not at all  Social Connections: Socially Isolated (07/26/2023)   Social Connection and Isolation Panel [NHANES]    Frequency of Communication with Friends and Family: Once a week    Frequency of Social Gatherings with Friends and Family: More than three times a week    Attends Religious Services: Never    Database administrator or Organizations: No    Attends Banker Meetings: Never    Marital Status: Widowed  Intimate Partner Violence: Not At Risk (07/26/2023)   Humiliation, Afraid, Rape, and Kick questionnaire    Fear of Current or Ex-Partner: No    Emotionally Abused: No    Physically Abused: No     Sexually Abused: No    Family History  Problem Relation Age of Onset   Cancer Father        lung    Current Outpatient Medications on File Prior to Visit  Medication Sig Dispense Refill   Acetaminophen (TYLENOL 8 HOUR PO) Take by mouth.     amLODipine (NORVASC) 5 MG tablet TAKE 1 TABLET(5 MG) BY MOUTH DAILY 90 tablet 0   Cholecalciferol (VITAMIN D3) 10 MCG (400 UNIT) tablet Take 400 Units by mouth daily.     Cyanocobalamin (VITAMIN B 12 PO) Take by mouth.     donepezil (ARICEPT) 5 MG tablet TAKE 1 TABLET(5 MG) BY MOUTH DAILY 90 tablet 0   furosemide (  LASIX) 20 MG tablet TAKE 1 TABLET BY MOUTH DAILY AS NEEDED FOR LEG SWELLING 30 tablet 0   loratadine (CLARITIN) 10 MG tablet Take 10 mg by mouth daily.     Multiple Vitamins-Minerals (PRESERVISION AREDS PO) Take by mouth.     TURMERIC PO Take 1 capsule by mouth daily.     No current facility-administered medications on file prior to visit.    Allergies  Allergen Reactions   Codeine     GI upset   Morphine And Codeine Other (See Comments)    GI upset GI upset   Other Other (See Comments)    Pain killers, unknown,        Physical Exam Vitals requested from patient and listed below if patient had equipment and was able to obtain at home for this virtual visit: There were no vitals filed for this visit. Estimated body mass index is 28.4 kg/m as calculated from the following:   Height as of 10/04/22: 5' (1.524 m).   Weight as of 10/04/22: 145 lb 6.4 oz (66 kg).  EKG (optional): deferred due to virtual visit  GENERAL: alert, oriented, no acute distress detected, full vision exam deferred due to pandemic and/or virtual encounter, pt hard of hearing   HEENT: atraumatic, conjunttiva clear, no obvious abnormalities on inspection of external nose and ears  NECK: normal movements of the head and neck  LUNGS: on inspection no signs of respiratory distress, breathing rate appears normal, no obvious gross SOB, gasping or  wheezing  CV: no obvious cyanosis  MS: moves all visible extremities without noticeable abnormality  PSYCH/NEURO: pleasant and cooperative, no obvious depression or anxiety, speech and thought processing grossly intact, Cognitive function grossly intact  Flowsheet Row Office Visit from 07/26/2023 in [facility] [place]  PHQ-9 Total Score 10           07/26/2023   12:29 PM 07/14/2022    1:10 PM 05/29/2022   11:15 AM 03/13/2022    3:55 PM 07/12/2021    1:34 PM  Depression screen PHQ 2/9  Decreased Interest 1 0 3 2 0  Down, Depressed, Hopeless 3 0 1 1 1   PHQ - 2 Score 4 0 4 3 1   Altered sleeping 0 0 2 1   Tired, decreased energy 3 0 3 1   Change in appetite 3 0 1 0   Feeling bad or failure about yourself  0 0 1 0   Trouble concentrating 0 0 0 0   Moving slowly or fidgety/restless 0 0 0 0   Suicidal thoughts 0 0 1 0   PHQ-9 Score 10 0 12 5   Difficult doing work/chores Very difficult Not difficult at all Very difficult Not difficult at all   Feels like depression is her normal baseline and not any worse. And pt feels is just old age.      03/13/2022    3:15 PM 05/29/2022   11:15 AM 07/14/2022    1:13 PM 08/31/2022    1:47 PM 07/26/2023   12:31 PM  Fall Risk  Falls in the past year? 1 1 0 1 0  Was there an injury with Fall? 1 0 0 0 0  Fall Risk Category Calculator 2 1 0 1 0  Fall Risk Category (Retired) Moderate      (RETIRED) Patient Fall Risk Level Low fall risk      Patient at Risk for Falls Due to No Fall Risks No Fall Risks No Fall Risks  No Fall Risks  Fall risk Follow up Falls evaluation completed Falls evaluation completed Falls prevention discussed Falls evaluation completed Falls evaluation completed   Uses walker.  SUMMARY AND PLAN:  Encounter for Medicare annual wellness exam   Discussed applicable health maintenance/preventive health measures and advised and referred or ordered per patient preferences: -declined bone density -discussed  vaccines due, let them know can do at the pharmacy if she wises to do Health Maintenance  Topic Date Due   Zoster Vaccines- Shingrix (1 of 2) Never done   Pneumonia Vaccine 34+ Years old (1 of 1 - PCV) Never done   DTaP/Tdap/Td (2 - Tdap) 04/11/2007   INFLUENZA VACCINE  12/07/2022   COVID-19 Vaccine (4 - 2024-25 season) 01/07/2023   DEXA SCAN  07/25/2024 (Originally 09/06/1995)   Medicare Annual Wellness (AWV)  07/25/2024   HPV VACCINES  Aged [employer] and counseling on the following was provided based on the above review of health and a plan/checklist for the patient, along with additional information discussed, was provided for the patient in the patient instructions :  -Provided counseling and plan for difficulty hearing - they have tried hearing aids but patient refuses to wear. -Provided counseling and plan for increased risk of falling if applicable per above screening. She uses walker and caution.  Safe balance exercises provided in pt instructions -Reviewed patient's current diet. Advised and counseled on a whole foods based healthy diet. A summary of a healthy diet was provided in the Patient Instructions.  -reviewed patient's current physical activity level and discussed  ideas for safe exercise at home.  -Advise yearly dental visits at minimum and regular eye exams -Advised and counseled on alcohol safe limits, risks - she has been drinking this way for a very long time and they are not interested in changing.  Follow up: see patient instructions     Patient Instructions  I really enjoyed getting to talk with you today! I am available on Tuesdays and Thursdays for virtual visits if you have any questions or concerns, or if I can be of any further assistance.   CHECKLIST FROM ANNUAL WELLNESS VISIT:  -Follow up (please call to schedule if not scheduled after visit):   -yearly for annual wellness visit with primary care office  Here is a list of your preventive  care/health maintenance measures and the plan for each if any are due:  PLAN For any measures below that may be due:  -can get the vaccines at the pharmacy, if you do please let us know so that we can update your record  Health Maintenance  Topic Date Due   Zoster Vaccines- Shingrix (1 of 2) Never done   Pneumonia Vaccine 61+ Years old (1 of 1 - PCV) Never done   DTaP/Tdap/Td (2 - Tdap) 04/11/2007   INFLUENZA VACCINE  12/07/2022   COVID-19 Vaccine (4 - 2024-25 season) 01/07/2023   DEXA SCAN  07/25/2024 (Originally 09/06/1995)   Medicare Annual Wellness (AWV)  07/25/2024   HPV VACCINES  Aged Out    -See a dentist at least yearly  -Get your eyes checked and then per your eye specialist's recommendations  -Other issues addressed today:   -Alcohol: not more than 1 drink per day is recommended for women  -I have included below further information regarding a healthy whole foods based diet, physical activity guidelines for adults, stress management and opportunities for social connections. I hope you find this information useful.   -----------------------------------------------------------------------------------------------------------------------------------------------------------------------------------------------------------------------------------------------------------  NUTRITION: -eat real food: lots of colorful vegetables (half the plate) and fruits -5-7 servings of vegetables and fruits per day (fresh or steamed is best), exp. 2 servings of vegetables with lunch and dinner and 2 servings of fruit per day. Berries and greens such as kale and collards are great choices.  -consume on a regular basis:  fresh fruits, fresh veggies, fish, nuts, seeds, healthy oils (such as olive oil, avocado oil), whole grains (make sure for bread/pasta/crackers/etc., that the first ingredient on label contains the word "whole"), legumes. -can eat small amounts of dairy and lean meat (no larger  than the palm of your hand), but avoid processed meats such as ham, bacon, lunch meat, etc. -drink water -try to avoid fast food and pre-packaged foods, processed meat, ultra processed foods/beverages (donuts, candy, etc.) -most experts advise limiting sodium to < 2300mg  per day, should limit further is any chronic conditions such as high blood pressure, heart disease, diabetes, etc. The American Heart Association advised that < 1500mg  is is ideal -try to avoid foods/beverages that contain any ingredients with names you do not recognize  -try to avoid foods/beverages  with added sugar or sweeteners/sweets  -try to avoid sweet drinks (including diet drinks): soda, juice, Gatorade, sweet tea, power drinks, diet drinks -try to avoid white rice, white bread, pasta (unless whole grain)  EXERCISE GUIDELINES FOR ADULTS: -if you wish to increase your physical activity, do so gradually and with the approval of your doctor -STOP and seek medical care immediately if you have any chest pain, chest discomfort or trouble breathing when starting or increasing exercise  -move and stretch your body, legs, feet and arms when sitting for long periods -Physical activity guidelines for optimal health in adults: -get at least 150 minutes per week of moderate exercise (can talk, but not sing); this is about 20-30 minutes of sustained activity 5-7 days per week or two 10-15 minute episodes of sustained activity 5-7 days per week -do some muscle building/resistance training/strength training at least 2 days per week  -balance exercises 3+ days per week:   Stand somewhere where you have something sturdy to hold onto if you lose balance    1) lift up on toes, then back down, start with 5x per day and work up to 20x   2) stand and lift one leg straight out to the side so that foot is a few inches of the floor, start with 5x each side and work up to 20x each side   3) stand on one foot, start with 5 seconds each side and  work up to 20 seconds on each side  If you need ideas or help with getting more active:  -Silver sneakers https://tools.silversneakers.com  -Walk with a Doc: http://www.duncan-williams.com/  -try to include resistance (weight lifting/strength building) and balance exercises twice per week: or the following link for ideas: http://castillo-powell.com/  BuyDucts.dk  STRESS MANAGEMENT: -can try meditating, or just sitting quietly with deep breathing while intentionally relaxing all parts of your body for 5 minutes daily -if you need further help with stress, anxiety or depression please follow up with your primary doctor or contact the wonderful folks at [facility]: [phone]  SOCIAL CONNECTIONS: -options in [place] if you wish to engage in more social and exercise related activities:  -Silver sneakers https://tools.silversneakers.com  -Walk with a Doc: http://www.duncan-williams.com/  -Check out the [facility] Active Adults 50+ section on the [place] of [employer] (hiking clubs, book clubs, cards and games, chess, exercise classes,  aquatic classes and much more) - see the website for details: https://www.[place].gov/departments/parks-recreation/active-adults50  -YouTube has lots of exercise videos for different ages and abilities as well  -[facility] (a variety of indoor and outdoor inperson activities for adults). [phone]. [address].  -Virtual Online Classes (a variety of topics): see seniorplanet.org or call 2253672102  -consider volunteering at a school, hospice center, church, senior center or elsewhere            Terressa Koyanagi, DO

## 2023-07-26 NOTE — Patient Instructions (Signed)
 I really enjoyed getting to talk with you today! I am available on Tuesdays and Thursdays for virtual visits if you have any questions or concerns, or if I can be of any further assistance.   CHECKLIST FROM ANNUAL WELLNESS VISIT:  -Follow up (please call to schedule if not scheduled after visit):   -yearly for annual wellness visit with primary care office  Here is a list of your preventive care/health maintenance measures and the plan for each if any are due:  PLAN For any measures below that may be due:  -can get the vaccines at the pharmacy, if you do please let us know so that we can update your record  Health Maintenance  Topic Date Due   Zoster Vaccines- Shingrix (1 of 2) Never done   Pneumonia Vaccine 55+ Years old (1 of 1 - PCV) Never done   DTaP/Tdap/Td (2 - Tdap) 04/11/2007   INFLUENZA VACCINE  12/07/2022   COVID-19 Vaccine (4 - 2024-25 season) 01/07/2023   DEXA SCAN  07/25/2024 (Originally 09/06/1995)   Medicare Annual Wellness (AWV)  07/25/2024   HPV VACCINES  Aged Out    -See a dentist at least yearly  -Get your eyes checked and then per your eye specialist's recommendations  -Other issues addressed today:   -Alcohol: not more than 1 drink per day is recommended for women  -I have included below further information regarding a healthy whole foods based diet, physical activity guidelines for adults, stress management and opportunities for social connections. I hope you find this information useful.   -----------------------------------------------------------------------------------------------------------------------------------------------------------------------------------------------------------------------------------------------------------    NUTRITION: -eat real food: lots of colorful vegetables (half the plate) and fruits -5-7 servings of vegetables and fruits per day (fresh or steamed is best), exp. 2 servings of vegetables with lunch and dinner and 2  servings of fruit per day. Berries and greens such as kale and collards are great choices.  -consume on a regular basis:  fresh fruits, fresh veggies, fish, nuts, seeds, healthy oils (such as olive oil, avocado oil), whole grains (make sure for bread/pasta/crackers/etc., that the first ingredient on label contains the word "whole"), legumes. -can eat small amounts of dairy and lean meat (no larger than the palm of your hand), but avoid processed meats such as ham, bacon, lunch meat, etc. -drink water -try to avoid fast food and pre-packaged foods, processed meat, ultra processed foods/beverages (donuts, candy, etc.) -most experts advise limiting sodium to < 2300mg  per day, should limit further is any chronic conditions such as high blood pressure, heart disease, diabetes, etc. The American Heart Association advised that < 1500mg  is is ideal -try to avoid foods/beverages that contain any ingredients with names you do not recognize  -try to avoid foods/beverages  with added sugar or sweeteners/sweets  -try to avoid sweet drinks (including diet drinks): soda, juice, Gatorade, sweet tea, power drinks, diet drinks -try to avoid white rice, white bread, pasta (unless whole grain)  EXERCISE GUIDELINES FOR ADULTS: -if you wish to increase your physical activity, do so gradually and with the approval of your doctor -STOP and seek medical care immediately if you have any chest pain, chest discomfort or trouble breathing when starting or increasing exercise  -move and stretch your body, legs, feet and arms when sitting for long periods -Physical activity guidelines for optimal health in adults: -get at least 150 minutes per week of moderate exercise (can talk, but not sing); this is about 20-30 minutes of sustained activity 5-7 days per week or two 10-15  minute episodes of sustained activity 5-7 days per week -do some muscle building/resistance training/strength training at least 2 days per week  -balance  exercises 3+ days per week:   Stand somewhere where you have something sturdy to hold onto if you lose balance    1) lift up on toes, then back down, start with 5x per day and work up to 20x   2) stand and lift one leg straight out to the side so that foot is a few inches of the floor, start with 5x each side and work up to 20x each side   3) stand on one foot, start with 5 seconds each side and work up to 20 seconds on each side  If you need ideas or help with getting more active:  -Silver sneakers https://tools.silversneakers.com  -Walk with a Doc: http://www.duncan-williams.com/  -try to include resistance (weight lifting/strength building) and balance exercises twice per week: or the following link for ideas: http://castillo-powell.com/  BuyDucts.dk  STRESS MANAGEMENT: -can try meditating, or just sitting quietly with deep breathing while intentionally relaxing all parts of your body for 5 minutes daily -if you need further help with stress, anxiety or depression please follow up with your primary doctor or contact the wonderful folks at [facility]: [phone]  SOCIAL CONNECTIONS: -options in [place] if you wish to engage in more social and exercise related activities:  -Silver sneakers https://tools.silversneakers.com  -Walk with a Doc: http://www.duncan-williams.com/  -Check out the [facility] Active Adults 50+ section on the [place] [employer] (hiking clubs, book clubs, cards and games, chess, exercise classes, aquatic classes and much more) - see the website for details: https://www.-[place].gov/departments/parks-recreation/active-adults50  -YouTube has lots of exercise videos for different ages and abilities as well  -[facility] (a variety of indoor and outdoor inperson activities for adults). ([phone]. [address].  -Virtual Online  Classes (a variety of topics): see seniorplanet.org or call [phone]  -consider volunteering at a school, hospice center, church, senior center or elsewhere

## 2023-08-24 ENCOUNTER — Ambulatory Visit (INDEPENDENT_AMBULATORY_CARE_PROVIDER_SITE_OTHER)

## 2023-08-24 DIAGNOSIS — I442 Atrioventricular block, complete: Secondary | ICD-10-CM | POA: Diagnosis not present

## 2023-08-27 LAB — CUP PACEART REMOTE DEVICE CHECK
Battery Impedance: 2079 Ohm
Battery Remaining Longevity: 31 mo
Battery Voltage: 2.75 V
Brady Statistic AP VP Percent: 1 %
Brady Statistic AP VS Percent: 0 %
Brady Statistic AS VP Percent: 99 %
Brady Statistic AS VS Percent: 1 %
Date Time Interrogation Session: 20250418210025
Implantable Lead Connection Status: 753985
Implantable Lead Connection Status: 753985
Implantable Lead Implant Date: 20150120
Implantable Lead Implant Date: 20150120
Implantable Lead Location: 753859
Implantable Lead Location: 753860
Implantable Lead Model: 5076
Implantable Lead Model: 5076
Implantable Pulse Generator Implant Date: 20150120
Lead Channel Impedance Value: 473 Ohm
Lead Channel Impedance Value: 532 Ohm
Lead Channel Pacing Threshold Amplitude: 0.5 V
Lead Channel Pacing Threshold Amplitude: 1.25 V
Lead Channel Pacing Threshold Pulse Width: 0.4 ms
Lead Channel Pacing Threshold Pulse Width: 0.4 ms
Lead Channel Setting Pacing Amplitude: 2 V
Lead Channel Setting Pacing Amplitude: 2.5 V
Lead Channel Setting Pacing Pulse Width: 0.4 ms
Lead Channel Setting Sensing Sensitivity: 2.8 mV
Zone Setting Status: 755011
Zone Setting Status: 755011

## 2023-08-28 ENCOUNTER — Other Ambulatory Visit: Payer: Self-pay | Admitting: Family Medicine

## 2023-08-28 DIAGNOSIS — I10 Essential (primary) hypertension: Secondary | ICD-10-CM

## 2023-08-28 NOTE — Telephone Encounter (Signed)
 Copied from CRM [id number]. Topic: Clinical - Medication Refill >> Aug 28, 2023  3:23 PM Danae Duncans wrote: Most Recent Primary Care Visit:  Provider: Maurie Southern  Department: [facility]  Visit Type: MEDICARE AWV, SEQUENTIAL  Date: 07/26/2023  Medication: amLODipine  (NORVASC ) 5 MG tablet  Has the patient contacted their pharmacy? Yes (Agent: If no, request that the patient contact the pharmacy for the refill. If patient does not wish to contact the pharmacy document the reason why and proceed with request.) (Agent: If yes, when and what did the pharmacy advise?) sent fax- pcp has to authorize refill  Is this the correct pharmacy for this prescription? Yes If no, delete pharmacy and type the correct one.  This is the patient's preferred pharmacy:  [id number] Jonette Nestle,  - [address] AT [facility] [address] [place] [address] Phone: [phone] Fax: [phone]   Has the prescription been filled recently? No  Is the patient out of the medication? Yes  Has the patient been seen for an appointment in the last year OR does the patient have an upcoming appointment? Yes  Can we respond through MyChart? Yes  Agent: Please be advised that Rx refills may take up to 3 business days. We ask that you follow-up with your pharmacy.

## 2023-08-29 ENCOUNTER — Other Ambulatory Visit: Payer: Self-pay | Admitting: Family Medicine

## 2023-08-29 DIAGNOSIS — I10 Essential (primary) hypertension: Secondary | ICD-10-CM

## 2023-08-29 MED ORDER — AMLODIPINE BESYLATE 5 MG PO TABS: ORAL_TABLET | ORAL | 0 refills | Status: DC

## 2023-09-04 ENCOUNTER — Encounter: Payer: Self-pay | Admitting: Family Medicine

## 2023-09-04 ENCOUNTER — Ambulatory Visit (INDEPENDENT_AMBULATORY_CARE_PROVIDER_SITE_OTHER): Admitting: Family Medicine

## 2023-09-04 VITALS — BP 128/74 | HR 88 | Temp 98.3°F | Ht 60.0 in | Wt 145.2 lb

## 2023-09-04 DIAGNOSIS — R0600 Dyspnea, unspecified: Secondary | ICD-10-CM | POA: Diagnosis not present

## 2023-09-04 DIAGNOSIS — I421 Obstructive hypertrophic cardiomyopathy: Secondary | ICD-10-CM | POA: Diagnosis not present

## 2023-09-04 MED ORDER — METOPROLOL SUCCINATE ER 25 MG PO TB24: 25.0000 mg | ORAL_TABLET | Freq: Every day | ORAL | 5 refills | Status: DC

## 2023-09-04 NOTE — Progress Notes (Signed)
 Established Patient Office Visit  Subjective   Patient ID: Theresa Norton, female    DOB: 1928/08/11  Age: 89 y.o. MRN: [id number]  Chief Complaint  Patient presents with   Medication Management    HPI   [name] is an almost 89 year old female with history of atrial fibrillation, history of heart block with pacemaker in place, remote history of alcohol dependence, cognitive impairment, bilateral chronic hearing loss, hypertension.  Her current medications include amlodipine  5 mg daily and furosemide  20 mg once daily as needed for edema  She was seen in my absence last April with some shortness of breath which was reported 3 months duration.  She had had pacemaker placed 2018.  No history of smoking.  No history of chronic lung disease or asthma.  No COPD history.  She was referred to cardiology.  Technically difficult study.  Ejection fraction estimated 60 to 65%.  No regional wall motion abnormalities.  There was comment of severe asymmetric left ventricular hypertrophy of the basal septal segment.  Mitral valve degenerative with mild mitral valve regurgitation.  Severe mitral annular calcification.  Aortic valve tricuspid with moderate calcification and moderate thickening of aortic valve.  Mild to moderate aortic valve stenosis.  Interestingly, left atrial size was normal.  Looks like plan was to refer her to hypertrophic cardiomyopathy clinic but they reportedly decided not to go.  She has not had any acute worsening of shortness of breath since then.  Has occasional edema.  No recent chest pains.  She has some dizziness and dyspnea predominantly with activity.  No clear postprandial symptoms.  Past Medical History:  Diagnosis Date   Allergy    Anemia    Arthritis    "in my fingers" (07/08/2013)   CAD (coronary artery disease)    a. 05/2013 nonobs dzs by cath.   Colon polyps    Groin hematoma    a. 05/2013 R groin hematoma post-cath - u/s 05/31/13 large 7.49 cm r inguinal  hematoma extending into thigh, no psa or avf.   H/O hiatal hernia    Heart block AV second degree    a. s/p MDT Addapta dual chamber pacemaker 05/2013 by Dr Nunzio Belch.   Hypertension    On home oxygen  therapy    "2L maybe 12h/day" (07/08/2013)   Pacemaker    Pacemaker    PAF (paroxysmal atrial fibrillation) (HCC)    a. Found post-pacer 05/2013->Xarelto  added;  b. 05/2013 Echo: EF 60-65%, mild LVH, nl wall motion w/o rwma.   Past Surgical History:  Procedure Laterality Date   BREAST CYST EXCISION Right 1959; 1970's   BUNIONECTOMY Bilateral 1967   CARDIAC CATHETERIZATION     CATARACT EXTRACTION W/ INTRAOCULAR LENS  IMPLANT, BILATERAL Bilateral    DILATION AND CURETTAGE OF UTERUS     INSERT / REPLACE / REMOVE PACEMAKER  05/2013   MDT [id number] implanted by Dr Nunzio Belch for heart block   INSERT / REPLACE / REMOVE PACEMAKER  06/2013   Right ventricular lead perforation with tamponade   LEAD REVISION N/A 06/13/2013   Procedure: LEAD REVISION;  Surgeon: Ellaree Gunther, MD;  Location: [facility];  Service: Cardiovascular;  Laterality: N/A;   LEFT HEART CATHETERIZATION WITH CORONARY ANGIOGRAM N/A 05/27/2013   Procedure: LEFT HEART CATHETERIZATION WITH CORONARY ANGIOGRAM;  Surgeon: Darrold Emms, MD;  Location: [facility];  Service: Cardiovascular;  Laterality: N/A;   PERICARDIAL TAP N/A 06/11/2013   Procedure: PERICARDIAL TAP;  Surgeon: Darrold Emms, MD;  Location: [facility];  Service: Cardiovascular;  Laterality: N/A;   PERMANENT PACEMAKER INSERTION N/A 05/27/2013   Procedure: PERMANENT PACEMAKER INSERTION;  Surgeon: Ellaree Gunther, MD;  Location: [facility];  Service: Cardiovascular;  Laterality: N/A;   TEMPORARY PACEMAKER INSERTION N/A 05/27/2013   Procedure: TEMPORARY PACEMAKER INSERTION;  Surgeon: Darrold Emms, MD;  Location: [facility];  Service: Cardiovascular;  Laterality: N/A;   VARICOSE VEIN SURGERY Bilateral ~ 2002    reports that she has never smoked. She has never used smokeless tobacco.  She reports current alcohol use of about 14.0 standard drinks of alcohol per week. She reports that she does not use drugs. family history includes Cancer in her father. Allergies  Allergen Reactions   Codeine     GI upset   Morphine  And Codeine Other (See Comments)    GI upset GI upset   Other Other (See Comments)    Pain killers, unknown,     Review of Systems  Constitutional:  Negative for chills, fever and weight loss.  Respiratory:  Positive for shortness of breath. Negative for cough, hemoptysis and wheezing.   Cardiovascular:  Negative for chest pain.      Objective:     BP 128/74 (BP Location: Left Arm, Patient Position: Sitting, Cuff Size: Normal)   Pulse 88   Temp 98.3 F (36.8 C) (Oral)   Ht 5' (1.524 m)   Wt 145 lb 3.2 oz (65.9 kg)   SpO2 96%   BMI 28.36 kg/m  BP Readings from Last 3 Encounters:  09/04/23 128/74  10/04/22 112/66  08/31/22 120/74   Wt Readings from Last 3 Encounters:  09/04/23 145 lb 3.2 oz (65.9 kg)  10/04/22 145 lb 6.4 oz (66 kg)  08/31/22 149 lb 4.8 oz (67.7 kg)      Physical Exam Vitals reviewed.  Constitutional:      General: She is not in acute distress.    Appearance: Normal appearance. She is not ill-appearing.  Cardiovascular:     Rate and Rhythm: Normal rate.     Heart sounds: Murmur heard.     Comments: Soft systolic murmur aortic valve and 3/6 systolic ejection murmur mitral valve Musculoskeletal:     Comments: No significant pitting edema at this time  Neurological:     General: No focal deficit present.     Mental Status: She is alert.      No results found for any visits on 09/04/23.    The ASCVD Risk score (Arnett DK, et al., 2019) failed to calculate for the following reasons:   The 2019 ASCVD risk score is only valid for ages 106 to 62    Assessment & Plan:   Almost 89 year old female with history of some dementia who has had some chronic dyspnea and history of complete heart block with pacemaker in  place.  She had echo last summer which showed severe asymmetric basal hypertrophy with increased left ventricular outflow gradient.  Suspect dyspnea related to hypertrophic obstructive cardiomyopathy.  She has no chronic lung issues.  They declined HCM clinic through cardiology.  Does have fairly prominent heart murmur consistent with some aortic stenosis and mitral regurgitation.  They are very interested in conservative management given her age and multiple comorbidities.  We discussed the following  -Again offered referral back to cardiology and specifically to HCM clinic but they declined at this time -Discussed importance of keeping up for preload with good hydration - Try to avoid regular use of diuretics  such as furosemide  which can decrease preload - Avoid overaggressive treatment of her blood pressure to keep afterload up slightly - We discussed discontinuation of amlodipine  secondary to vasodilation which could worsen her symptoms - Start low-dose Toprol -XL 25 mg - She is obviously not a surgical candidate nor candidate for alcohol septal ablation.  Nor would she be a good candidate for mavacamten with her dementia and difficulties with follow-up - Daughter requesting new DNR order as their previous one has been lost.  We signed a new one today. - Set up 1 month follow-up to reassess  Glean Lamy, MD

## 2023-09-04 NOTE — Patient Instructions (Signed)
 Stay well hydrated  STOP the Amlodipine    Try to avoid regular use of the Furosemide   Start the Toprol  XL 25 one daily.

## 2023-09-21 ENCOUNTER — Ambulatory Visit: Payer: Self-pay

## 2023-09-21 NOTE — Telephone Encounter (Signed)
 Copied from CRM [id number]. Topic: Clinical - Red Word Triage >> Sep 21, 2023  4:26 PM Adrionna Y wrote: Red Word that prompted transfer to Nurse Triage: swelling   Her leg is swollen  She was previous taken off of medication for water retention   Chief Complaint: Leg swelling Symptoms: Bilateral leg swelling Frequency: Constant  Pertinent Negatives: Patient denies any chest pain or shortness of breath  Disposition: [] ED /[] Urgent Care (no appt availability in office) / [] Appointment(In office/virtual)/ []  [facility]/ [x] Home Care/ [] Refused Recommended Disposition /[] [facility]/ [x]  Follow-up with PCP Additional Notes: Patient's daughter called to report that the patient has had swelling in her bilateral legs for the last 2 weeks. She states that at her recent appointment the patient was advised to avoid regular use of her Lasix  and has not taken any since that time. I advised that the notes show to avoid regular use but does not state to discontinue using it, and that it should be okay for her to take for her swelling. She will have the patient take her Lasix  and monitor her swelling. She would like a call back with further clarification or instructions to help with the patient's swelling when possible. Follow-up appointment also scheduled per her request for 5/28.     Reason for Disposition  [1] MODERATE leg swelling (e.g., swelling extends up to knees) AND [2] new-onset or worsening    Patient will take prescribed Lasix   Answer Assessment - Initial Assessment Questions 1. ONSET: "When did the swelling start?" (e.g., minutes, hours, days)     2 weeks  2. LOCATION: "What part of the leg is swollen?"  "Are both legs swollen or just one leg?"     Bilateral, left worse than right  3. SEVERITY: "How bad is the swelling?" (e.g., localized; mild, moderate, severe)   - Localized: Small area of swelling localized to one leg.   - MILD pedal edema: Swelling limited to foot  and ankle, pitting edema < 1/4 inch (6 mm) deep, rest and elevation eliminate most or all swelling.   - MODERATE edema: Swelling of lower leg to knee, pitting edema > 1/4 inch (6 mm) deep, rest and elevation only partially reduce swelling.   - SEVERE edema: Swelling extends above knee, facial or hand swelling present.      Mild to moderate  4. REDNESS: "Does the swelling look red or infected?"     No 5. PAIN: "Is the swelling painful to touch?" If Yes, ask: "How painful is it?"   (Scale 1-10; mild, moderate or severe)     No 6. FEVER: "Do you have a fever?" If Yes, ask: "What is it, how was it measured, and when did it start?"      No 7. CAUSE: "What do you think is causing the leg swelling?"     History of similar  9. RECURRENT SYMPTOM: "Have you had leg swelling before?" If Yes, ask: "When was the last time?" "What happened that time?"     Yes 10. OTHER SYMPTOMS: "Do you have any other symptoms?" (e.g., chest pain, difficulty breathing)       No  Protocols used: Leg Swelling and Edema-A-AH

## 2023-10-03 ENCOUNTER — Encounter: Payer: Self-pay | Admitting: Family Medicine

## 2023-10-03 ENCOUNTER — Ambulatory Visit (INDEPENDENT_AMBULATORY_CARE_PROVIDER_SITE_OTHER): Admitting: Family Medicine

## 2023-10-03 VITALS — BP 116/68 | HR 72 | Temp 97.7°F | Wt 147.2 lb

## 2023-10-03 DIAGNOSIS — I421 Obstructive hypertrophic cardiomyopathy: Secondary | ICD-10-CM | POA: Diagnosis not present

## 2023-10-03 DIAGNOSIS — R21 Rash and other nonspecific skin eruption: Secondary | ICD-10-CM | POA: Diagnosis not present

## 2023-10-03 DIAGNOSIS — F339 Major depressive disorder, recurrent, unspecified: Secondary | ICD-10-CM

## 2023-10-03 MED ORDER — NYSTATIN 100000 UNIT/GM EX CREA: 1.0000 | TOPICAL_CREAM | Freq: Two times a day (BID) | CUTANEOUS | 2 refills | Status: DC

## 2023-10-03 MED ORDER — ESCITALOPRAM OXALATE 5 MG PO TABS: 5.0000 mg | ORAL_TABLET | Freq: Every day | ORAL | 5 refills | Status: DC

## 2023-10-03 MED ORDER — METOPROLOL SUCCINATE ER 25 MG PO TB24: 25.0000 mg | ORAL_TABLET | Freq: Every day | ORAL | 3 refills | Status: DC

## 2023-10-03 NOTE — Addendum Note (Signed)
 Addended by: Lott Rouleau A on: 10/03/2023 08:42 AM   Modules accepted: Orders

## 2023-10-03 NOTE — Progress Notes (Signed)
 Remote pacemaker transmission.

## 2023-10-03 NOTE — Patient Instructions (Signed)
 Start the Lexapro 5 mg once daily  Keep area under breast dry as possible  Leave off the Neosporin  Use the Nystatin cream twice daily and be in touch in two weeks if no better.

## 2023-10-03 NOTE — Progress Notes (Signed)
 Established Patient Office Visit  Subjective   Patient ID: Theresa Norton, female    DOB: 11/23/1928  Age: 89 y.o. MRN: [id number]  Chief Complaint  Patient presents with   Medical Management of Chronic Issues    HPI   Theresa Norton is seen for medical follow up, accompanied by her daughter Theresa Norton.  She currently lives with Theresa Norton.  Refer to last note for details.  She has evidence for severe asymmetric basal septal hypertrophy with outflow obstruction.  She had been referred at one point to the hypertrophic cardiomyopathy clinic but they declined.  At last visit she was on amlodipine  and diuretic.  We discontinued amlodipine  and started Toprol  XL 25 mg daily.  Encouraged good hydration.  She is tolerating the Toprol -XL well.  Very sedentary at baseline.  Difficult to gauge if she is much improved symptomatically.  She is unfortunately drinking wine 4-5 times per day estimated 15 to 20 ounces per day.  She has some chronic mild lower extremity edema but not worsened any since stopping amlodipine  and starting Toprol -XL.  No reported orthopnea.  Ambulating with walker and daughter has not noticed any recent increased dyspnea with that.  Daughter does have concerns about possible depression.  Patient has made comments several times that she is "ready to die ".  She does have cognitive impairment and is very hard of hearing which makes communication difficult.  She has had history of recurrent depression in the past.  Currently not on any medication.  Rash under right breast.  They have been applying Neosporin without improvement.  IMPRESSIONS     1. Technically difficult study. Left ventricular ejection fraction, by  estimation, is 60 to 65%. The left ventricle has normal function. The left  ventricle has no regional wall motion abnormalities. There is severe  asymmetric left ventricular  hypertrophy of the basal septal segment. Left ventricular diastolic  parameters are indeterminate.    2. Right ventricular systolic function is normal. The right ventricular  size is normal.   3. The mitral valve is degenerative. Mild mitral valve regurgitation. No  evidence of mitral stenosis. Severe mitral annular calcification.   4. The aortic valve is tricuspid. There is moderate calcification of the  aortic valve. There is moderate thickening of the aortic valve. Aortic  valve regurgitation is not visualized. Mild to moderate aortic valve  stenosis. Vmax 2.8 m/s, MG , AVA  1.4 cm^2, DI 0.45   FINDINGS   Left Ventricle: Left ventricular ejection fraction, by estimation, is 60  to 65%. The left ventricle has normal function. The left ventricle has no  regional wall motion abnormalities. The left ventricular internal cavity  size was small. There is severe  asymmetric left ventricular hypertrophy of the basal-septal segment. Left  ventricular diastolic parameters are indeterminate.   Right Ventricle: The right ventricular size is normal. No increase in  right ventricular wall thickness. Right ventricular systolic function is  normal.   Left Atrium: Left atrial size was normal in size.   Right Atrium: Right atrial size was not well visualized.   Pericardium: Trivial pericardial effusion is present. Presence of  epicardial fat layer.   Mitral Valve: The mitral valve is degenerative in appearance. Severe  mitral annular calcification. Mild mitral valve regurgitation. No evidence  of mitral valve stenosis.   Tricuspid Valve: The tricuspid valve is normal in structure. Tricuspid  valve regurgitation is trivial.   Aortic Valve: The aortic valve is tricuspid. There is moderate  calcification of  the aortic valve. There is moderate thickening of the  aortic valve. Aortic valve regurgitation is not visualized. Mild to  moderate aortic stenosis is present. Aortic valve  mean gradient measures 15.0 mmHg. Aortic valve peak gradient measures 28.8  mmHg. Aortic valve area, by VTI  measures 1.55 cm.   Pulmonic Valve: The pulmonic valve was not well visualized. Pulmonic valve  regurgitation is trivial.   Aorta: The aortic root and ascending aorta are structurally normal, with  no evidence of dilitation.   IAS/Shunts: The interatrial septum was not well visualized.     LEFT VENTRICLE  PLAX 2D  LVIDd:         2.50 cm   Diastology  LVIDs:         1.80 cm   LV e' medial:    10.60 cm/s  LV PW:         1.60 cm   LV E/e' medial:  6.8  LV IVS:        1.70 cm   LV e' lateral:   7.94 cm/s  LVOT diam:     2.00 cm   LV E/e' lateral: 9.1  LV SV:         75  LV SV Index:   46  LVOT Area:     3.14 cm     LEFT ATRIUM             Index  LA diam:        4.00 cm 2.45 cm/m  LA Vol (A2C):   47.8 ml 29.32 ml/m  LA Vol (A4C):   50.0 ml 30.67 ml/m  LA Biplane Vol: 49.0 ml 30.06 ml/m   AORTIC VALVE  AV Area (Vmax):    1.74 cm  AV Area (Vmean):   1.53 cm  AV Area (VTI):     1.55 cm  AV Vmax:           268.50 cm/s  AV Vmean:          180.500 cm/s  AV VTI:            0.484 m  AV Peak Grad:      28.8 mmHg  AV Mean Grad:      15.0 mmHg  LVOT Vmax:         149.00 cm/s  LVOT Vmean:        88.000 cm/s  LVOT VTI:          0.239 m  LVOT/AV VTI ratio: 0.49    AORTA  Ao Root diam: 3.50 cm  Ao Asc diam:  3.80 cm   MITRAL VALVE                TRICUSPID VALVE  MV Area (PHT): 2.74 cm     TR Peak grad:   19.0 mmHg  MV Decel Time: 277 msec     TR Vmax:        218.00 cm/s  MV E velocity: 71.90 cm/s  MV A velocity: 146.00 cm/s  SHUNTS  MV E/A ratio:  0.49         Systemic VTI:  0.24 m                              Systemic Diam: 2.00 cm   Carson Clara MD  Electronically signed by Carson Clara MD  Signature Date/Time: 10/30/2022/10:26:02 PM      Past Medical History:  Diagnosis Date  Allergy    Anemia    Arthritis    "in my fingers" (07/08/2013)   CAD (coronary artery disease)    a. 05/2013 nonobs dzs by cath.   Colon polyps    Groin hematoma    a.  05/2013 R groin hematoma post-cath - u/s 05/31/13 large 7.49 cm r inguinal hematoma extending into thigh, no psa or avf.   H/O hiatal hernia    Heart block AV second degree    a. s/p MDT Addapta dual chamber pacemaker 05/2013 by Dr Nunzio Belch.   Hypertension    On home oxygen  therapy    "2L maybe 12h/day" (07/08/2013)   Pacemaker    Pacemaker    PAF (paroxysmal atrial fibrillation) (HCC)    a. Found post-pacer 05/2013->Xarelto  added;  b. 05/2013 Echo: EF 60-65%, mild LVH, nl wall motion w/o rwma.   Past Surgical History:  Procedure Laterality Date   BREAST CYST EXCISION Right 1959; 1970's   BUNIONECTOMY Bilateral 1967   CARDIAC CATHETERIZATION     CATARACT EXTRACTION W/ INTRAOCULAR LENS  IMPLANT, BILATERAL Bilateral    DILATION AND CURETTAGE OF UTERUS     INSERT / REPLACE / REMOVE PACEMAKER  05/2013   MDT [id number] implanted by Dr Nunzio Belch for heart block   INSERT / REPLACE / REMOVE PACEMAKER  06/2013   Right ventricular lead perforation with tamponade   LEAD REVISION N/A 06/13/2013   Procedure: LEAD REVISION;  Surgeon: Ellaree Gunther, MD;  Location: [facility];  Service: Cardiovascular;  Laterality: N/A;   LEFT HEART CATHETERIZATION WITH CORONARY ANGIOGRAM N/A 05/27/2013   Procedure: LEFT HEART CATHETERIZATION WITH CORONARY ANGIOGRAM;  Surgeon: Darrold Emms, MD;  Location: [facility];  Service: Cardiovascular;  Laterality: N/A;   PERICARDIAL TAP N/A 06/11/2013   Procedure: PERICARDIAL TAP;  Surgeon: Darrold Emms, MD;  Location: [facility];  Service: Cardiovascular;  Laterality: N/A;   PERMANENT PACEMAKER INSERTION N/A 05/27/2013   Procedure: PERMANENT PACEMAKER INSERTION;  Surgeon: Ellaree Gunther, MD;  Location: [facility];  Service: Cardiovascular;  Laterality: N/A;   TEMPORARY PACEMAKER INSERTION N/A 05/27/2013   Procedure: TEMPORARY PACEMAKER INSERTION;  Surgeon: Darrold Emms, MD;  Location: [facility];  Service: Cardiovascular;  Laterality: N/A;   VARICOSE VEIN SURGERY Bilateral ~ 2002     reports that she has never smoked. She has never used smokeless tobacco. She reports current alcohol use of about 14.0 standard drinks of alcohol per week. She reports that she does not use drugs. family history includes Cancer in her father. Allergies  Allergen Reactions   Codeine     GI upset   Morphine  And Codeine Other (See Comments)    GI upset GI upset   Other Other (See Comments)    Pain killers, unknown,     Review of Systems  Constitutional:  Negative for fever.  Eyes:  Negative for blurred vision.  Respiratory:  Negative for cough.   Cardiovascular:  Negative for chest pain.  Skin:  Positive for rash.  Neurological:  Negative for dizziness, focal weakness and headaches.      Objective:     BP 116/68 (BP Location: Left Arm, Cuff Size: Normal)   Pulse 72   Temp 97.7 F (36.5 C) (Oral)   Wt 147 lb 3.2 oz (66.8 kg)   SpO2 95%   BMI 28.75 kg/m  BP Readings from Last 3 Encounters:  10/03/23 116/68  09/04/23 128/74  10/04/22 112/66   Wt Readings from Last  3 Encounters:  10/03/23 147 lb 3.2 oz (66.8 kg)  09/04/23 145 lb 3.2 oz (65.9 kg)  10/04/22 145 lb 6.4 oz (66 kg)      Physical Exam Vitals reviewed.  Cardiovascular:     Rate and Rhythm: Normal rate and regular rhythm.     Comments: 2 or 6 systolic murmur left sternal border.  Not quite as prominent as last visit. Pulmonary:     Effort: Pulmonary effort is normal.     Breath sounds: Normal breath sounds. No wheezing or rales.  Musculoskeletal:     Comments: She has trace to 1+ pitting edema ankles and lower legs left greater than right.  Skin:    Comments: Diffuse erythematous macular rash underneath right breast.  Well-demarcated border.  Neurological:     Mental Status: She is alert.      No results found for any visits on 10/03/23.    The ASCVD Risk score (Arnett DK, et al., 2019) failed to calculate for the following reasons:   The 2019 ASCVD risk score is only valid for ages 71 to 33     Assessment & Plan:   #1 hypertrophic obstructive cardiomyopathy.  Patient recently changed from amlodipine  to beta-blocker with Toprol  XL 25 mg daily.  Very sedentary but may be some improved symptomatically with ambulation according to daughter.  Heart rate is certainly improved today and murmur somewhat less prominent.  We stressed the following  -Continue Toprol -XL 25 mg daily - Try to at least reduce alcohol (if not avoid completely) which can worsen symptoms - Ensure good hydration - Patient has been offered referral to HCM clinic through cardiology but they have declined. - Avoid over diuresing which can decrease preload and worsen symptoms - Would not be overly aggressive with blood pressure to avoid reducing afterload excessively  #2 rash underneath right breast.  Suspect probably Candida.  Keep dry as possible.  Start nystatin cream twice daily.  Be in touch if not improving or clearing of the couple weeks.  Also leave off Neosporin.  #3 history of recurrent depression.  Start back Lexapro 5 mg once daily.  Give feedback in 1 month if not seeing improvements.  Consider further titration if necessary at that time  Glean Lamy, MD

## 2023-11-09 ENCOUNTER — Other Ambulatory Visit: Payer: Self-pay | Admitting: Family Medicine

## 2023-11-09 DIAGNOSIS — R4189 Other symptoms and signs involving cognitive functions and awareness: Secondary | ICD-10-CM

## 2023-11-23 ENCOUNTER — Encounter

## 2023-12-04 ENCOUNTER — Ambulatory Visit

## 2023-12-04 DIAGNOSIS — I442 Atrioventricular block, complete: Secondary | ICD-10-CM | POA: Diagnosis not present

## 2023-12-05 LAB — CUP PACEART REMOTE DEVICE CHECK
Battery Impedance: 2153 Ohm
Battery Remaining Longevity: 30 mo
Battery Voltage: 2.74 V
Brady Statistic AP VP Percent: 1 %
Brady Statistic AP VS Percent: 0 %
Brady Statistic AS VP Percent: 99 %
Brady Statistic AS VS Percent: 1 %
Date Time Interrogation Session: 20250729181126
Implantable Lead Connection Status: 753985
Implantable Lead Connection Status: 753985
Implantable Lead Implant Date: 20150120
Implantable Lead Implant Date: 20150120
Implantable Lead Location: 753859
Implantable Lead Location: 753860
Implantable Lead Model: 5076
Implantable Lead Model: 5076
Implantable Pulse Generator Implant Date: 20150120
Lead Channel Impedance Value: 454 Ohm
Lead Channel Impedance Value: 528 Ohm
Lead Channel Pacing Threshold Amplitude: 0.5 V
Lead Channel Pacing Threshold Amplitude: 1 V
Lead Channel Pacing Threshold Pulse Width: 0.4 ms
Lead Channel Pacing Threshold Pulse Width: 0.4 ms
Lead Channel Setting Pacing Amplitude: 2 V
Lead Channel Setting Pacing Amplitude: 2.5 V
Lead Channel Setting Pacing Pulse Width: 0.4 ms
Lead Channel Setting Sensing Sensitivity: 2.8 mV
Zone Setting Status: 755011
Zone Setting Status: 755011

## 2023-12-08 ENCOUNTER — Ambulatory Visit: Payer: Self-pay | Admitting: Cardiology

## 2024-01-02 ENCOUNTER — Ambulatory Visit: Payer: Self-pay

## 2024-01-02 NOTE — Telephone Encounter (Signed)
 FYI Only or Action Required?: Action required by provider: clinical question for provider. Requesting medication for throat  Patient was last seen in primary care on 10/03/2023 by Micheal Wolm ORN, MD.  Called Nurse Triage reporting Sore Throat.  Symptoms began yesterday.  Interventions attempted: OTC medications: tylenol  and Rest, hydration, or home remedies.  Symptoms are: unchanged.  Triage Disposition: Home Care  Patient/caregiver understands and will follow disposition?: Yes, will follow disposition  Copied from CRM [id number]. Topic: Clinical - Medical Advice >> Jan 02, 2024  9:54 AM Drema MATSU wrote: Reason for CRM: Patient has a sore throat and daughter wants to know what patient can take over the counter or if something can be called in. She is requesting a callback. Reason for Disposition  [1] Sore throat is the only symptom AND [2] sore throat present < 48 hours  Answer Assessment - Initial Assessment Questions 1. ONSET: When did the throat start hurting? (Hours or days ago)      2 days 2. SEVERITY: How bad is the sore throat? (Scale 1-10; mild, moderate or severe)     Pretty painful 3. STREP EXPOSURE: Has there been any exposure to strep within the past week? If Yes, ask: What type of contact occurred?      denies 4.  VIRAL SYMPTOMS: Are there any symptoms of a cold, such as a runny nose, cough, hoarse voice or red eyes?      denies 5. FEVER: Do you have a fever? If Yes, ask: What is your temperature, how was it measured, and when did it start?     denies 6. PUS ON THE TONSILS: Is there pus on the tonsils in the back of your throat?     Pt daughter has not looked 7. OTHER SYMPTOMS: Do you have any other symptoms? (e.g., difficulty breathing, headache, rash)     Denies  Pt daughter calling to request medication for sore throat. Daughter Glendale does not want to bring pt to the clinic as she is 50. Pt is taking tylenol , drinking warm tea, lemon, and  honey. Pt has not taken at home covid test.  Protocols used: Sore Throat-A-AH

## 2024-01-02 NOTE — Telephone Encounter (Signed)
 Patients daughter Glendale informed of message below and voiced understanding

## 2024-01-03 ENCOUNTER — Ambulatory Visit: Payer: Self-pay

## 2024-01-03 NOTE — Telephone Encounter (Signed)
 FYI Only or Action Required?: FYI only for provider.  Patient was last seen in primary care on 10/03/2023 by Micheal Wolm ORN, MD.  Called Nurse Triage reporting Sore Throat and Mouth Lesions.  Symptoms began a week ago.  Interventions attempted: Rest, hydration, or home remedies.  Symptoms are: gradually worsening.  Triage Disposition: See Physician Within 24 Hours  Patient/caregiver understands and will follow disposition?: Yes      Copied from CRM #[id number]. Topic: Clinical - Red Word Triage >> Jan 03, 2024  3:07 PM Suzen RAMAN wrote: Red Word that prompted transfer to Nurse Triage: sore throat, white patches inside mouth, swollen lips. Reason for Disposition  SEVERE throat pain (e.g., excruciating)  Answer Assessment - Initial Assessment Questions 1. ONSET: When did the throat start hurting? (Hours or days ago)      X 1 week, worsening 2 days ago 2. SEVERITY: How bad is the sore throat? (Scale 1-10; mild, moderate or severe)     Unable to eat soft food but is able to drink liquids 3. STREP EXPOSURE: Has there been any exposure to strep within the past week? If Yes, ask: What type of contact occurred?      denies 4.  VIRAL SYMPTOMS: Are there any symptoms of a cold, such as a runny nose, cough, hoarse voice or red eyes?      Eye redness, cough Denies other sx Home COVID - 5. FEVER: Do you have a fever? If Yes, ask: What is your temperature, how was it measured, and when did it start?     denies 6. PUS ON THE TONSILS: Is there pus on the tonsils in the back of your throat?     Denies, white on tongue and sides of mouth 7. OTHER SYMPTOMS: Do you have any other symptoms? (e.g., difficulty breathing, headache, rash)     Generalized aches - endorses giving tylenol  with some relief 8. PREGNANCY: Is there any chance you are pregnant? When was your last menstrual period?     N/a    [facility] attempted to schedule with PCP, but no access. Scheduled at  alternate [facility].  Protocols used: Sore Throat-A-AH

## 2024-01-04 ENCOUNTER — Ambulatory Visit: Admitting: Family Medicine

## 2024-01-04 ENCOUNTER — Encounter: Payer: Self-pay | Admitting: Family Medicine

## 2024-01-04 VITALS — BP 127/79 | HR 95 | Temp 97.3°F | Ht 60.0 in | Wt 152.2 lb

## 2024-01-04 DIAGNOSIS — J029 Acute pharyngitis, unspecified: Secondary | ICD-10-CM | POA: Diagnosis not present

## 2024-01-04 DIAGNOSIS — B370 Candidal stomatitis: Secondary | ICD-10-CM

## 2024-01-04 DIAGNOSIS — R739 Hyperglycemia, unspecified: Secondary | ICD-10-CM

## 2024-01-04 LAB — COMPREHENSIVE METABOLIC PANEL WITH GFR
ALT: 8 U/L (ref 0–35)
AST: 18 U/L (ref 0–37)
Albumin: 3.8 g/dL (ref 3.5–5.2)
Alkaline Phosphatase: 109 U/L (ref 39–117)
BUN: 14 mg/dL (ref 6–23)
CO2: 23 meq/L (ref 19–32)
Calcium: 8.5 mg/dL (ref 8.4–10.5)
Chloride: 99 meq/L (ref 96–112)
Creatinine, Ser: 0.77 mg/dL (ref 0.40–1.20)
GFR: 66.54 mL/min (ref >60.00)
Glucose, Bld: 97 mg/dL (ref 70–99)
Potassium: 4.3 meq/L (ref 3.5–5.1)
Sodium: 134 meq/L — ABNORMAL LOW (ref 135–145)
Total Bilirubin: 0.9 mg/dL (ref 0.2–1.2)
Total Protein: 6.7 g/dL (ref 6.0–8.3)

## 2024-01-04 LAB — CBC
HCT: 42.4 % (ref 36.0–46.0)
Hemoglobin: 14.2 g/dL (ref 12.0–15.0)
MCHC: 33.5 g/dL (ref 30.0–36.0)
MCV: 103.3 fl — ABNORMAL HIGH (ref 78.0–100.0)
Platelets: 224 K/uL (ref 150.0–400.0)
RBC: 4.1 Mil/uL (ref 3.87–5.11)
RDW: 13.3 % (ref 11.5–15.5)
WBC: 9.5 K/uL (ref 4.0–10.5)

## 2024-01-04 LAB — HEMOGLOBIN A1C: Hgb A1c MFr Bld: 5.2 % (ref 4.6–6.5)

## 2024-01-04 LAB — POCT RAPID STREP A (OFFICE): Rapid Strep A Screen: NEGATIVE

## 2024-01-04 MED ORDER — LIDOCAINE VISCOUS HCL 2 % MT SOLN: 5.0000 mL | Freq: Three times a day (TID) | OROMUCOSAL | 0 refills | Status: DC | PRN

## 2024-01-04 MED ORDER — ALBUTEROL SULFATE HFA 108 (90 BASE) MCG/ACT IN AERS: 2.0000 | INHALATION_SPRAY | Freq: Four times a day (QID) | RESPIRATORY_TRACT | 0 refills | Status: DC | PRN

## 2024-01-04 MED ORDER — CLOTRIMAZOLE 10 MG MT TROC: 10.0000 mg | Freq: Every day | OROMUCOSAL | 0 refills | Status: DC

## 2024-01-04 NOTE — Patient Instructions (Signed)
 It was very nice to see you today!  VISIT SUMMARY: Today, you came in because of a sore throat and white patches in your mouth, which have been causing you significant discomfort for the past three days. You were diagnosed with thrush, and we also decided to screen you for diabetes due to the presence of thrush and the fact that you haven't had recent blood work done.  YOUR PLAN: ORAL AND OROPHARYNGEAL CANDIDIASIS (THRUSH): You have thrush, which is causing white patches and redness in your mouth, leading to pain and difficulty eating. - Please use the clotrimazole  dissolving tablets 5 times daily for the next 7 to 14 days - Use the Magic mouthwash as needed for pain and irritation  SCREENING FOR DIABETES MELLITUS: We need to check your blood sugar levels because thrush can sometimes be related to diabetes, and you haven't had recent blood work done. -We will perform a blood draw to check your blood sugar levels.  Return if symptoms worsen or fail to improve.   Take care, Dr Kennyth  PLEASE NOTE:  If you had any lab tests, please let us  know if you have not heard back within a few days. You may see your results on mychart before we have a chance to review them but we will give you a call once they are reviewed by us .   If we ordered any referrals today, please let us  know if you have not heard from their office within the next week.   If you had any urgent prescriptions sent in today, please check with the pharmacy within an hour of our visit to make sure the prescription was transmitted appropriately.   Please try these tips to maintain a healthy lifestyle:  Eat at least 3 REAL meals and 1-2 snacks per day.  Aim for no more than 5 hours between eating.  If you eat breakfast, please do so within one hour of getting up.   Each meal should contain half fruits/vegetables, one quarter protein, and one quarter carbs (no bigger than a computer mouse)  Cut down on sweet beverages. This  includes juice, soda, and sweet tea.   Drink at least 1 glass of water with each meal and aim for at least 8 glasses per day  Exercise at least 150 minutes every week.

## 2024-01-04 NOTE — Progress Notes (Signed)
   Theresa Norton is a 89 y.o. female who presents today for an office visit.  Assessment/Plan:  Sore throat/thrush Rapid strep negative.  Exam consistent with thrush.  Will treat this with clotrimazole  troches.  Will also give prescription for Magic wealth wash to help with her pain and discomfort level as well.  No clear etiology for thrush-she does not have any recent courses of antibiotics or other predisposing factors.  We did discuss checking labs today to rule out diabetes or other potential underlying causes.  They are agreeable to this.  Will check labs today.  They will let us  know if symptoms do not improve over the next several days.  We discussed reasons to return to care.  Follow-up as needed.    Subjective:  HPI:  See assessment / plan for status of chronic conditions.    Discussed the use of AI scribe software for clinical note transcription with the patient, who gave verbal consent to proceed.  History of Present Illness Theresa Norton is a 89 year old female who presents with a sore throat and white patches in her mouth.  She began experiencing a sore throat and white patches in her mouth approximately three days ago, causing significant discomfort. Her lips are also irritated. She denies fever but reports significant pain. She has not had any recent antibiotic use and has not experienced thrush before. She sometimes experiences difficulty breathing, for which she uses albuterol , which provides relief.  She was recently diagnosed with hypertrophic obstructive cardiomyopathy (HOCM). It has been a couple of years since her last blood work was done.         Objective:  Physical Exam: Temp (!) 97.3 F (36.3 C) (Temporal)   Ht 5' (1.524 m)   Wt 152 lb 3.2 oz (69 kg)   BMI 29.72 kg/m   Gen: No acute distress, resting comfortably HEENT: Oral mucosa erythematous with scattered white patches along buccal mucosa bilaterally Neuro: Grossly normal, moves all  extremities Psych: Normal affect and thought content      Venetta Knee M. Kennyth, MD 01/04/2024 12:59 PM

## 2024-01-08 ENCOUNTER — Telehealth: Payer: Self-pay

## 2024-01-08 ENCOUNTER — Ambulatory Visit: Payer: Self-pay | Admitting: Family Medicine

## 2024-01-08 NOTE — Telephone Encounter (Signed)
 Patient's daughter Glendale informed rx was received but is not covered by insurance. [place] reported she will pay Out of pocket for this prescription.

## 2024-01-08 NOTE — Progress Notes (Signed)
 Blood work is all stable compared to previous values.  No signs of diabetes.  They should let us  know if her thrush is not improving with the treatment we discussed last week.

## 2024-01-08 NOTE — Telephone Encounter (Signed)
 Copied from CRM #[id number]. Topic: Clinical - Prescription Issue >> Jan 08, 2024  1:28 PM Mia F wrote: Reason for CRM: Pt daughter called and says that the pharmacy did not receive the magic mouthwash (lidocaine , diphenhydrAMINE , alum & mag hydroxide) suspension. Chart shows it was sent but she says when she went to pick it up it was not there.

## 2024-01-14 ENCOUNTER — Telehealth: Payer: Self-pay | Admitting: *Deleted

## 2024-01-14 NOTE — Telephone Encounter (Signed)
 Copied from CRM [id number]. Topic: Clinical - Medical Advice >> Jan 10, 2024  9:51 AM [name] wrote: Reason for CRM: Patient called in regarding missed call, relay results to daughter, patient stated she understood and had no further questions. Patient daughter also stated that patient thrush is improving

## 2024-01-30 ENCOUNTER — Other Ambulatory Visit: Payer: Self-pay | Admitting: Family Medicine

## 2024-02-06 NOTE — Progress Notes (Signed)
 Remote PPM Transmission

## 2024-02-18 ENCOUNTER — Telehealth: Payer: Self-pay

## 2024-02-18 NOTE — Telephone Encounter (Unsigned)
 Copied from CRM [id number]. Topic: Clinical - Prescription Issue >> Feb 18, 2024  3:30 PM Nessti S wrote: Reason for CRM: patient daughter called because patient was prescribed albuterol  (VENTOLIN  HFA) 108 (90 Base) MCG/ACT inhaler but doesn't have any refills. Patient wakes up with difficulty breathing and daughter would like a refill to help with breathing. Call back number [phone]

## 2024-02-19 MED ORDER — ALBUTEROL SULFATE HFA 108 (90 BASE) MCG/ACT IN AERS: 2.0000 | INHALATION_SPRAY | Freq: Four times a day (QID) | RESPIRATORY_TRACT | 0 refills | Status: DC | PRN

## 2024-02-22 ENCOUNTER — Encounter

## 2024-02-25 ENCOUNTER — Ambulatory Visit: Payer: Self-pay

## 2024-02-25 ENCOUNTER — Ambulatory Visit (INDEPENDENT_AMBULATORY_CARE_PROVIDER_SITE_OTHER): Admitting: Family Medicine

## 2024-02-25 ENCOUNTER — Encounter: Payer: Self-pay | Admitting: Family Medicine

## 2024-02-25 VITALS — BP 120/72 | HR 83 | Temp 97.3°F

## 2024-02-25 DIAGNOSIS — N6314 Unspecified lump in the right breast, lower inner quadrant: Secondary | ICD-10-CM

## 2024-02-25 DIAGNOSIS — R4189 Other symptoms and signs involving cognitive functions and awareness: Secondary | ICD-10-CM | POA: Diagnosis not present

## 2024-02-25 DIAGNOSIS — I421 Obstructive hypertrophic cardiomyopathy: Secondary | ICD-10-CM

## 2024-02-25 DIAGNOSIS — R0602 Shortness of breath: Secondary | ICD-10-CM

## 2024-02-25 MED ORDER — METOPROLOL SUCCINATE ER 50 MG PO TB24: 50.0000 mg | ORAL_TABLET | Freq: Every day | ORAL | 3 refills | Status: DC

## 2024-02-25 NOTE — Telephone Encounter (Signed)
 FYI Only or Action Required?: FYI only for provider.  Patient was last seen in primary care on 01/04/2024 by Kennyth Worth HERO, MD.  Called Nurse Triage reporting Shortness of Breath.  Symptoms began 2-3 months and worsening last few weeks.  Interventions attempted: Nothing.  Symptoms are: gradually worsening.  Triage Disposition: See Physician Within 24 Hours  Patient/caregiver understands and will follow disposition?: Yes    Copied from CRM #[id number]. Topic: Clinical - Red Word Triage >> Feb 25, 2024  8:57 AM [place] A wrote: [facility] that prompted transfer to Nurse Triage: Patients daughter called Ivey) said that she sent a message on Mychart and was informed to call Triage Nurse. Reason for Disposition  [1] MODERATE leg swelling (e.g., swelling extends up to knees) AND [2] new-onset or getting worse    Pt has appt for today.  [1] MODERATE longstanding difficulty breathing (e.g., speaks in phrases, SOB even at rest, pulse 100-120) AND [2] SAME as normal  Answer Assessment - Initial Assessment Questions 1. RESPIRATORY STATUS: Describe your breathing? (e.g., wheezing, shortness of breath, unable to speak, severe coughing)      SOB, wheezing, @ @ 90 in mornings, coughing-dry & coughing spells 2. ONSET: When did this breathing problem begin?      About 2 months  3. PATTERN Does the difficult breathing come and go, or has it been constant since it started?      Happens 1st thing in the morning and at times through out the day 4. SEVERITY: How bad is your breathing? (e.g., mild, moderate, severe)      moderate 5. RECURRENT SYMPTOM: Have you had difficulty breathing before? If Yes, ask: When was the last time? and What happened that time?      na 6. CARDIAC HISTORY: Do you have any history of heart disease? (e.g., heart attack, angina, bypass surgery, angioplasty)     pacemaker 7. LUNG HISTORY: Do you have any history of lung disease?  (e.g., pulmonary embolus,  asthma, emphysema)     no 8. CAUSE: What do you think is causing the breathing problem?      unsure 9. OTHER SYMPTOMS: Do you have any other symptoms? (e.g., chest pain, cough, dizziness, fever, runny nose)     Cough, dizziness 10. O2 SATURATION MONITOR:  Do you use an oxygen  saturation monitor (pulse oximeter) at home? If Yes, ask: What is your reading (oxygen  level) today? What is your usual oxygen  saturation reading? (e.g., 95%)       90's at times 11. PREGNANCY: Is there any chance you are pregnant? When was your last menstrual period?       na 12. TRAVEL: Have you traveled out of the country in the last month? (e.g., travel history, exposures)       na  Pt states I can't breath in 1st thing in the morning- 2-3 puff - pt calms down  Answer Assessment - Initial Assessment Questions 1. ONSET: When did the swelling start? (e.g., minutes, hours, days)     Ongoing and worsening 2 to 3 weeks 2. LOCATION: What part of the leg is swollen?  Are both legs swollen or just one leg?     Bilateral leg from feet to knee 3. DEGREE OF SWELLING: How large is the swelling?      moderate 4. SEVERITY of WIDESPREAD SWELLING (e.g., Edema): How bad is the swelling?     moderate 5. REDNESS: Does the swelling look red or infected?     red 6.  PAIN: Is there any pain? If so, ask, How bad is it?     no 7. ITCH: Does the swelling itch? If so, ask, How much?     no 8. CAUSE: What do you think caused the swelling?      Na  At times pt states she can not feel legs but then gets up and then gets up to walk  Protocols used: Breathing Difficulty-A-AH, Leg Swelling and Edema-A-AH

## 2024-02-25 NOTE — Progress Notes (Signed)
 Established Patient Office Visit  Subjective   Patient ID: Theresa Norton, female    DOB: 07-01-1928  Age: 89 y.o. MRN: [id number]  Chief Complaint  Patient presents with   Shortness of Breath   Edema    HPI   Theresa Norton is seen today accompanied by daughter.  She is very hard of hearing and also has cognitive impairment so much of communication is from daughter.  She has past history of complete heart block and echo 6/24 showing severe asymmetric left ventricular perjury the basal septal segment with EF 60 to 65%.  Referral was made at that time to hypertrophic cardiomyopathy clinic but they never went.  She has some chronic mild bilateral edema of the legs which has increased somewhat lately.  Generally doing okay at rest but has dyspnea with minimal exertion.  We had added low-dose metoprolol  XL 25 mg and encouraged good hydration and try to avoid diuretics if possible to avoid dropping her preload with her HOCM.  She also has history of aortic valve calcification and mild to moderate aortic valve stenosis.  Very sedentary.  Ambulates with walker.  Currently takes Lexapro  5 mg daily and Toprol -XL 25 mg daily.  Patient lives with daughter.  Does have longstanding history of alcohol abuse.  Other problems include history of hypertension, history of atrial fibrillation, GERD, history of monoclonal paraproteinemia, macular degeneration  She is still drinking up to 5 to 6 glasses of wine per day  Other issues they recently noticed a lump and some thickening and firmness to the tissue right ventral breast region around 6:00.  She does not have any history of breast cancer  Past Medical History:  Diagnosis Date   Allergy    Anemia    Arthritis    in my fingers (07/08/2013)   CAD (coronary artery disease)    a. 05/2013 nonobs dzs by cath.   Colon polyps    Groin hematoma    a. 05/2013 R groin hematoma post-cath - u/s 05/31/13 large 7.49 cm r inguinal hematoma extending into thigh, no  psa or avf.   H/O hiatal hernia    Heart block AV second degree    a. s/p MDT Addapta dual chamber pacemaker 05/2013 by Dr Kelsie.   Hypertension    On home oxygen  therapy    2L maybe 12h/day (07/08/2013)   Pacemaker    Pacemaker    PAF (paroxysmal atrial fibrillation) (HCC)    a. Found post-pacer 05/2013->Xarelto  added;  b. 05/2013 Echo: EF 60-65%, mild LVH, nl wall motion w/o rwma.   Past Surgical History:  Procedure Laterality Date   BREAST CYST EXCISION Right 1959; 1970's   BUNIONECTOMY Bilateral 1967   CARDIAC CATHETERIZATION     CATARACT EXTRACTION W/ INTRAOCULAR LENS  IMPLANT, BILATERAL Bilateral    DILATION AND CURETTAGE OF UTERUS     INSERT / REPLACE / REMOVE PACEMAKER  05/2013   MDT ADDRL1 implanted by Dr Kelsie for heart block   INSERT / REPLACE / REMOVE PACEMAKER  06/2013   Right ventricular lead perforation with tamponade   LEAD REVISION N/A 06/13/2013   Procedure: LEAD REVISION;  Surgeon: Lynwood JONETTA Kelsie, MD;  Location: [facility];  Service: Cardiovascular;  Laterality: N/A;   LEFT HEART CATHETERIZATION WITH CORONARY ANGIOGRAM N/A 05/27/2013   Procedure: LEFT HEART CATHETERIZATION WITH CORONARY ANGIOGRAM;  Surgeon: Salena GORMAN Negri, MD;  Location: [facility];  Service: Cardiovascular;  Laterality: N/A;   PERICARDIAL TAP N/A 06/11/2013  Procedure: PERICARDIAL TAP;  Surgeon: Salena GORMAN Negri, MD;  Location: [facility];  Service: Cardiovascular;  Laterality: N/A;   PERMANENT PACEMAKER INSERTION N/A 05/27/2013   Procedure: PERMANENT PACEMAKER INSERTION;  Surgeon: Lynwood JONETTA Rakers, MD;  Location: [facility];  Service: Cardiovascular;  Laterality: N/A;   TEMPORARY PACEMAKER INSERTION N/A 05/27/2013   Procedure: TEMPORARY PACEMAKER INSERTION;  Surgeon: Salena GORMAN Negri, MD;  Location: [facility];  Service: Cardiovascular;  Laterality: N/A;   VARICOSE VEIN SURGERY Bilateral ~ 2002    reports that she has never smoked. She has never used smokeless tobacco. She reports current alcohol use of  about 14.0 standard drinks of alcohol per week. She reports that she does not use drugs. family history includes Cancer in her father. Allergies  Allergen Reactions   Codeine     GI upset   Morphine  And Codeine Other (See Comments)    GI upset GI upset   Other Other (See Comments)    Pain killers, unknown,     Review of Systems  Constitutional:  Negative for chills and fever.  Respiratory:  Positive for shortness of breath. Negative for hemoptysis.   Cardiovascular:  Positive for leg swelling. Negative for chest pain.  Gastrointestinal:  Negative for nausea and vomiting.      Objective:     BP 120/72   Pulse 83   Temp (!) 97.3 F (36.3 C) (Oral)   SpO2 92%  BP Readings from Last 3 Encounters:  02/25/24 120/72  01/04/24 127/79  10/03/23 116/68   Wt Readings from Last 3 Encounters:  01/04/24 152 lb 3.2 oz (69 kg)  10/03/23 147 lb 3.2 oz (66.8 kg)  09/04/23 145 lb 3.2 oz (65.9 kg)      Physical Exam Vitals reviewed.  Constitutional:      General: She is not in acute distress. Cardiovascular:     Rate and Rhythm: Normal rate and regular rhythm.     Heart sounds: Murmur heard.     Comments: Systolic murmur noted right upper sternal border and left sternal border Pulmonary:     Comments: Somewhat diminished breath sounds in both bases.  No rales.  No wheezes. Genitourinary:    Comments: Right breast reveals irregular mass which is firm to palpation right inferior breast around the 6 o'clock position.  She has significant skin dimpling in this region.  Mass is fairly large and palpated around 4 x 5 cm. Musculoskeletal:     Right lower leg: Edema present.     Left lower leg: Edema present.  Neurological:     Mental Status: She is alert.      No results found for any visits on 02/25/24.    The ASCVD Risk score (Arnett DK, et al., 2019) failed to calculate for the following reasons:   The 2019 ASCVD risk score is only valid for ages 44 to 22    Assessment &  Plan:   #1 dyspnea.  Patient has history of severe asymmetric septal hypertrophy of basal septum.  She has had some recent increased peripheral edema.  Currently on low-dose beta-blocker.  Longstanding history of alcohol abuse which is certainly not helping.  She has significant hypoxia with minimal activity.  She had the following O2 values today in office:  -Pulse oximetry at rest room air 92%. - Pulse oximetry room air with ambulation with walker 86% - Pulse oximetry room air on 2 L O2 improved to 97% with movement/ambulation  We discussed several factors with  daughter as follows  -We did offer referring back to cardiology specifically to their hypertrophic cardiomyopathy clinic but they are not interested at this point - We discussed importance of trying to scale back if not discontinue her alcohol but she has been very reluctant in the past.  Discussed importance again of maintaining good preload, afterload, and heart rate control - Try bumping Toprol -XL up to 50 mg daily as her heart rate today at rest was 83 -We discussed setting up home O2 for comfort measures as she is struggling quite a bit with simple ambulation and transfers at home and had documented drop in O2 sats to 86% with minimal activity today in office - Daughter is definitely more interested in focusing on comfort measures knowing that she has limited life expectancy.  We did discuss palliative care versus hospice and she would like to proceed with palliative care at least initially with understanding that this may need to transition very quickly to hospice care.  #2 right breast mass.  Very likely cancer.  She has skin dimpling and hard irregular mass which is nontender.  We discussed that this is extremely likely breast cancer and daughter is not interested in pursuing mammogram or any further intervention.  She states that her mother would not go for any sort of intervention including surgery, radiation, or chemotherapy.   They did not see any utility in doing further testing which seems reasonable given her heart situation, dementia, limited life expectancy  Wolm Scarlet, MD

## 2024-02-25 NOTE — Patient Instructions (Signed)
 Stay well hydrated  REDUCE or AVOID alcohol altogether.   We will be setting up home oxygen  and Palliative Care consult.

## 2024-02-26 ENCOUNTER — Ambulatory Visit: Payer: Self-pay | Admitting: Family Medicine

## 2024-02-26 LAB — COMPREHENSIVE METABOLIC PANEL WITH GFR
ALT: 13 U/L (ref 0–35)
AST: 35 U/L (ref 0–37)
Albumin: 3.6 g/dL (ref 3.5–5.2)
Alkaline Phosphatase: 122 U/L — ABNORMAL HIGH (ref 39–117)
BUN: 11 mg/dL (ref 6–23)
CO2: 19 meq/L (ref 19–32)
Calcium: 8.7 mg/dL (ref 8.4–10.5)
Chloride: 90 meq/L — ABNORMAL LOW (ref 96–112)
Creatinine, Ser: 0.72 mg/dL (ref 0.40–1.20)
GFR: 72.05 mL/min (ref >60.00)
Glucose, Bld: 88 mg/dL (ref 70–99)
Potassium: 4.3 meq/L (ref 3.5–5.1)
Sodium: 122 meq/L — ABNORMAL LOW (ref 135–145)
Total Bilirubin: 0.8 mg/dL (ref 0.2–1.2)
Total Protein: 6.5 g/dL (ref 6.0–8.3)

## 2024-02-26 LAB — BRAIN NATRIURETIC PEPTIDE: Pro B Natriuretic peptide (BNP): 251 pg/mL — ABNORMAL HIGH (ref 0.0–100.0)

## 2024-02-29 ENCOUNTER — Telehealth: Payer: Self-pay | Admitting: Family Medicine

## 2024-02-29 NOTE — Telephone Encounter (Signed)
 Copied from CRM [id number]. Topic: Clinical - Prescription Issue >> Feb 28, 2024  4:53 PM Zebedee SAUNDERS wrote: Reason for CRM: Pt's daughter Joyelle, Siedlecki [phone] calling on behalf of pt regarding ordering oxygen  and home health care for pt. Pt was seen by Dr. Micheal on Mon. Oct. 20th and pt was told doctor would place order. Please call [phone] Factor for update.

## 2024-02-29 NOTE — Telephone Encounter (Signed)
 I spoke with [place] and 02 was received and delivered yesterday for patient

## 2024-03-03 ENCOUNTER — Telehealth: Payer: Self-pay

## 2024-03-03 NOTE — Telephone Encounter (Signed)
 Patient's daughter informed that we have sent message to referral coordinator for scheduling

## 2024-03-03 NOTE — Telephone Encounter (Signed)
 Copied from CRM [id number]. Topic: General - Other >> Mar 03, 2024  8:10 AM Alexandria E wrote: Reason for CRM: Patient's daughter, Glendale, called in wanting an update about a palliative care consult that was discussed at time of visit on 10/20, Glendale has not heard anything regarding this consult.

## 2024-03-04 ENCOUNTER — Ambulatory Visit: Attending: Family Medicine

## 2024-03-05 ENCOUNTER — Encounter: Payer: Self-pay | Admitting: Family Medicine

## 2024-03-06 ENCOUNTER — Emergency Department (HOSPITAL_COMMUNITY)
Admission: EM | Admit: 2024-03-06 | Discharge: 2024-03-07 | Disposition: A | Discharge status: Home / Self Care | Attending: Emergency Medicine | Admitting: Emergency Medicine

## 2024-03-06 ENCOUNTER — Emergency Department (HOSPITAL_COMMUNITY)

## 2024-03-06 ENCOUNTER — Ambulatory Visit: Payer: Self-pay

## 2024-03-06 DIAGNOSIS — I5020 Unspecified systolic (congestive) heart failure: Secondary | ICD-10-CM | POA: Insufficient documentation

## 2024-03-06 DIAGNOSIS — E860 Dehydration: Secondary | ICD-10-CM | POA: Diagnosis not present

## 2024-03-06 DIAGNOSIS — R638 Other symptoms and signs concerning food and fluid intake: Secondary | ICD-10-CM | POA: Diagnosis present

## 2024-03-06 LAB — COMPREHENSIVE METABOLIC PANEL WITH GFR
ALT: 9 U/L (ref 0–44)
AST: 36 U/L (ref 15–41)
Albumin: 3.5 g/dL (ref 3.5–5.0)
Alkaline Phosphatase: 128 U/L — ABNORMAL HIGH (ref 38–126)
Anion gap: 16 — ABNORMAL HIGH (ref 5–15)
BUN: 24 mg/dL — ABNORMAL HIGH (ref 8–23)
CO2: 22 mmol/L (ref 22–32)
Calcium: 9.4 mg/dL (ref 8.9–10.3)
Chloride: 92 mmol/L — ABNORMAL LOW (ref 98–111)
Creatinine, Ser: 0.74 mg/dL (ref 0.44–1.00)
GFR, Estimated: >60 mL/min (ref >60)
Glucose, Bld: 100 mg/dL — ABNORMAL HIGH (ref 70–99)
Potassium: 4.2 mmol/L (ref 3.5–5.1)
Sodium: 130 mmol/L — ABNORMAL LOW (ref 135–145)
Total Bilirubin: 0.6 mg/dL (ref 0.0–1.2)
Total Protein: 7 g/dL (ref 6.5–8.1)

## 2024-03-06 LAB — CBC WITH DIFFERENTIAL/PLATELET
Abs Immature Granulocytes: 0.02 K/uL (ref 0.00–0.07)
Basophils Absolute: 0 K/uL (ref 0.0–0.1)
Basophils Relative: 1 %
Eosinophils Absolute: 0 K/uL (ref 0.0–0.5)
Eosinophils Relative: 0 %
HCT: 41.9 % (ref 36.0–46.0)
Hemoglobin: 14.1 g/dL (ref 12.0–15.0)
Immature Granulocytes: 0 %
Lymphocytes Relative: 10 %
Lymphs Abs: 0.8 K/uL (ref 0.7–4.0)
MCH: 33.5 pg (ref 26.0–34.0)
MCHC: 33.7 g/dL (ref 30.0–36.0)
MCV: 99.5 fL (ref 80.0–100.0)
Monocytes Absolute: 0.8 K/uL (ref 0.1–1.0)
Monocytes Relative: 10 %
Neutro Abs: 6 K/uL (ref 1.7–7.7)
Neutrophils Relative %: 79 %
Platelets: 208 K/uL (ref 150–400)
RBC: 4.21 MIL/uL (ref 3.87–5.11)
RDW: 12.8 % (ref 11.5–15.5)
WBC: 7.6 K/uL (ref 4.0–10.5)
nRBC: 0 % (ref 0.0–0.2)

## 2024-03-06 LAB — URINALYSIS, ROUTINE W REFLEX MICROSCOPIC
Bilirubin Urine: NEGATIVE
Glucose, UA: NEGATIVE mg/dL
Hgb urine dipstick: NEGATIVE
Ketones, ur: 20 mg/dL — AB
Leukocytes,Ua: NEGATIVE
Nitrite: NEGATIVE
Protein, ur: NEGATIVE mg/dL
Specific Gravity, Urine: 1.025 (ref 1.005–1.030)
pH: 5 (ref 5.0–8.0)

## 2024-03-06 MED ORDER — LORAZEPAM 2 MG/ML IJ SOLN
0.5000 mg | Freq: Once | INTRAMUSCULAR | Status: AC
  Administered 2024-03-06: 0.5 mg via INTRAVENOUS
  Filled 2024-03-06: qty 1

## 2024-03-06 MED ORDER — FUROSEMIDE 10 MG/ML IJ SOLN
20.0000 mg | Freq: Once | INTRAMUSCULAR | Status: AC
Start: 2024-03-06 — End: 2024-03-06
  Administered 2024-03-06: 20 mg via INTRAVENOUS
  Filled 2024-03-06: qty 4

## 2024-03-06 MED ORDER — SODIUM CHLORIDE 0.9 % IV BOLUS
1000.0000 mL | Freq: Once | INTRAVENOUS | Status: AC
  Administered 2024-03-06: 1000 mL via INTRAVENOUS

## 2024-03-06 NOTE — ED Provider Notes (Signed)
 [place] EMERGENCY DEPARTMENT AT [facility] Provider Note   CSN: [id number] Arrival date & time: 03/06/24  1734     Patient presents with: poor oral intake   Theresa Norton is a 89 y.o. female.  {Add pertinent medical, surgical, social history, OB history to YEP:67052} Patient with hypertrophic cardiomyopathy.  Patient is getting palliative care now.  She is presently on 3 L nasal oxygen .  She has had poor p.o. intake for the last few days and the family is also concerned she may have a UTI.   Weakness      Prior to Admission medications   Medication Sig Start Date End Date Taking? Authorizing Provider  Acetaminophen  (TYLENOL  8 HOUR PO) Take by mouth.    [provider]  albuterol  (VENTOLIN  HFA) 108 (90 Base) MCG/ACT inhaler Inhale 2 puffs into the lungs every 6 (six) hours as needed for wheezing or shortness of breath. 02/19/24   Burchette, Wolm ORN, MD  Cholecalciferol (VITAMIN D3) 10 MCG (400 UNIT) tablet Take 400 Units by mouth daily.    [provider]  clotrimazole  (MYCELEX ) 10 MG troche Take 1 tablet (10 mg total) by mouth 5 (five) times daily. 01/04/24   Kennyth Worth HERO, MD  Cyanocobalamin  (VITAMIN B 12 PO) Take by mouth.    [provider]  escitalopram  (LEXAPRO ) 5 MG tablet Take 1 tablet (5 mg total) by mouth daily. 10/03/23   Burchette, Wolm ORN, MD  furosemide  (LASIX ) 20 MG tablet TAKE 1 TABLET BY MOUTH DAILY AS NEEDED FOR LEG SWELLING Patient not taking: Reported on 02/25/2024 05/16/23   Micheal Wolm ORN, MD  loratadine (CLARITIN) 10 MG tablet Take 10 mg by mouth daily.    [provider]  metoprolol  succinate (TOPROL -XL) 50 MG 24 hr tablet Take 1 tablet (50 mg total) by mouth daily. Take with or immediately following a meal. 02/25/24   Burchette, Wolm ORN, MD  nystatin  cream (MYCOSTATIN ) Apply 1 Application topically 2 (two) times daily. 10/03/23   Burchette, Wolm ORN, MD  TURMERIC PO Take 1 capsule by mouth daily.     [provider]    Allergies: Codeine, Morphine  and codeine, and Other    Review of Systems  Neurological:  Positive for weakness.    Updated Vital Signs BP (!) 169/93 (BP Location: Right Arm)   Pulse 98   Temp 97.8 F (36.6 C) (Oral)   Resp 18   SpO2 93%   Physical Exam  (all labs ordered are listed, but only abnormal results are displayed) Labs Reviewed  COMPREHENSIVE METABOLIC PANEL WITH GFR - Abnormal; Notable for the following components:      Result Value   Sodium 130 (*)    Chloride 92 (*)    Glucose, Bld 100 (*)    BUN 24 (*)    Alkaline Phosphatase 128 (*)    Anion gap 16 (*)    All other components within normal limits  URINALYSIS, ROUTINE W REFLEX MICROSCOPIC - Abnormal; Notable for the following components:   Ketones, ur 20 (*)    All other components within normal limits  CBC WITH DIFFERENTIAL/PLATELET    EKG: None  Radiology: DG Chest Port 1 View Result Date: 03/06/2024 EXAM: 1 VIEW(S) XRAY OF THE CHEST 03/06/2024 07:05:00 PM COMPARISON: Chest x-ray 08/31/2022, CT chest 12/19/2019. CLINICAL HISTORY: SOB SOB FINDINGS: LUNGS AND PLEURA: At least small left and trace to small right pleural effusions. Bibasilar airspace opacities underlying pleural effusions likely. Chronic coarsened interstitial markings. No  pulmonary edema. No pneumothorax. HEART AND MEDIASTINUM: Atherosclerotic plaque. No acute abnormality of the cardiac and mediastinal silhouettes. BONES AND SOFT TISSUES: No acute osseous abnormality. Left chest wall, dual pacemaker. IMPRESSION: 1. Small left and trace-to-small right pleural effusions. 2. Bibasilar airspace opacities. May represent a combination of atelectasis versus infectious or inflammatory process. Electronically signed by: Morgane Naveau MD 03/06/2024 07:20 PM EDT RP Workstation: [id number]    {Document cardiac monitor, telemetry assessment procedure when appropriate:32947} Procedures   Medications Ordered in the ED   furosemide  (LASIX ) injection 20 mg (has no administration in time range)  sodium chloride  0.9 % bolus 1,000 mL (0 mLs Intravenous Stopped 03/06/24 2006)  LORazepam  (ATIVAN ) injection 0.5 mg (0.5 mg Intravenous Given 03/06/24 2005)      {Click here for ABCD2, HEART and other calculators REFRESH Note before signing:1}                              Medical Decision Making Amount and/or Complexity of Data Reviewed Labs: ordered. Radiology: ordered.  Risk Prescription drug management.   Patient with dehydration that has improved with some fluids.  She does also have hypertrophic cardiomyopathy and will take her Lasix  every other day now.  She will follow-up with her PCP next week  {Document critical care time when appropriate  Document review of labs and clinical decision tools ie CHADS2VASC2, etc  Document your independent review of radiology images and any outside records  Document your discussion with family members, caretakers and with consultants  Document social determinants of health affecting pt's care  Document your decision making why or why not admission, treatments were needed:32947:::1}   Final diagnoses:  Dehydration  Systolic congestive heart failure, unspecified HF chronicity [facility])    ED Discharge Orders     None

## 2024-03-06 NOTE — Telephone Encounter (Signed)
 FYI Only or Action Required?: FYI only for provider: ED advised.  Patient was last seen in primary care on 02/25/2024 by Theresa Norton ORN, MD.  Called Nurse Triage reporting Urine Output.  Symptoms began a week ago.  Interventions attempted: Nothing.  Symptoms are: gradually worsening.  Triage Disposition: Go to ED Now (or PCP Triage)  Patient/caregiver understands and will follow disposition?: Yes   Copied from CRM #[id number]. Topic: Clinical - Red Word Triage >> Mar 06, 2024  4:30 PM [name] wrote: [facility] that prompted transfer to Nurse Triage: Patient daugther Theresa Norton called in stated she think she has a UTI would like a prescription to be sent  Back Pain, Dark Urine, not eating or drinking anything .  Every night  and every morning her breathing drops Reason for Disposition  [1] Decreased urination and [2] drinking very little AND [3] dehydration suspected (e.g., dark urine, no urine > 12 hours, very dry mouth, very lightheaded)  Answer Assessment - Initial Assessment Questions Pt's daughter called in stating that patient is having back pain, dark urine, decreased appetite and not drinking. She states that in the mornings, she is gasping for air like she's just waking up but at night she does it to and daughter thinks that she is panicking a little bit. RN advised pt to go to the ER for eval due to not eating/drinking, and dark urine. Dtr stated understanding. RN asked if needed help calling 911 or anything, she stated no she'd do it.     1. SYMPTOM: What's the main symptom you're concerned about? (e.g., frequency, incontinence)     Back pain, dark urine 2. ONSET: When did the  dark urine  start?     Shortly after office appt last week 3. PAIN: Is there any pain? If Yes, ask: How bad is it? (Scale: 1-10; mild, moderate, severe)     Yes in back 4. CAUSE: What do you think is causing the symptoms?     unknown 5. OTHER SYMPTOMS: Do you have any other symptoms?  (e.g., blood in urine, fever, flank pain, pain with urination)     Back pain  Protocols used: Urinary Symptoms-A-AH

## 2024-03-06 NOTE — ED Triage Notes (Signed)
 Pt arrives by ems due to no oral intake about 1 week per daughter. C/o foul smelling urine. HR 88, BP 112/60 SpO2 94 3L baseline. CBG 99. Answer questions appropriately.

## 2024-03-06 NOTE — Discharge Instructions (Signed)
 Take the Lasix  20 mg every other day for 5 days and follow-up with your doctor in a week

## 2024-03-08 NOTE — Telephone Encounter (Signed)
 I spoke with daughter and offered our condolences.  She actually was worked up in the ER and then sent home and passed away in her sleep at home.  Wolm LELON Scarlet MD [facility]

## 2024-03-08 NOTE — Telephone Encounter (Signed)
 Spoke with [place], the patients daughter and informed her of the message below.  She stated the patient passed away last night and I offered condolences on behalf of the office.  Message sent to PCP.

## 2024-03-08 DEATH — deceased

## 2024-03-10 ENCOUNTER — Other Ambulatory Visit: Payer: Self-pay | Admitting: Family Medicine

## 2024-03-17 ENCOUNTER — Telehealth: Payer: Self-pay

## 2024-03-17 NOTE — Telephone Encounter (Signed)
 Copied from CRM #[id number]. Topic: General - Other >> Mar 17, 2024  9:14 AM Aleatha BROCKS wrote: Reason for CRM: [employer] calling to see if Dr Micheal received patient death certificate will be sending over again call back for any  questions [phone]

## 2024-03-17 NOTE — Telephone Encounter (Signed)
 Noted

## 2024-06-03 ENCOUNTER — Ambulatory Visit

## 2024-09-02 ENCOUNTER — Ambulatory Visit

## 2024-12-02 ENCOUNTER — Ambulatory Visit

## 2025-03-03 ENCOUNTER — Ambulatory Visit

## 2025-06-02 ENCOUNTER — Ambulatory Visit
